# Patient Record
Sex: Male | Born: 1935 | Race: White | Hispanic: No | Marital: Married | Smoking: Former smoker
Health system: Southern US, Community
[De-identification: ages and names within clinical notes are randomized; demographics above are authoritative.]

## PROBLEM LIST (undated history)

## (undated) DIAGNOSIS — M1990 Unspecified osteoarthritis, unspecified site: Secondary | ICD-10-CM

## (undated) DIAGNOSIS — C20 Malignant neoplasm of rectum: Secondary | ICD-10-CM

## (undated) DIAGNOSIS — I4821 Permanent atrial fibrillation: Secondary | ICD-10-CM

## (undated) DIAGNOSIS — E7800 Pure hypercholesterolemia, unspecified: Secondary | ICD-10-CM

## (undated) DIAGNOSIS — Z86718 Personal history of other venous thrombosis and embolism: Secondary | ICD-10-CM

## (undated) DIAGNOSIS — E785 Hyperlipidemia, unspecified: Secondary | ICD-10-CM

## (undated) DIAGNOSIS — C4490 Unspecified malignant neoplasm of skin, unspecified: Secondary | ICD-10-CM

## (undated) DIAGNOSIS — N184 Chronic kidney disease, stage 4 (severe): Secondary | ICD-10-CM

## (undated) DIAGNOSIS — I493 Ventricular premature depolarization: Secondary | ICD-10-CM

## (undated) DIAGNOSIS — Z5189 Encounter for other specified aftercare: Secondary | ICD-10-CM

## (undated) DIAGNOSIS — G4700 Insomnia, unspecified: Secondary | ICD-10-CM

## (undated) DIAGNOSIS — I255 Ischemic cardiomyopathy: Secondary | ICD-10-CM

## (undated) DIAGNOSIS — K219 Gastro-esophageal reflux disease without esophagitis: Secondary | ICD-10-CM

## (undated) DIAGNOSIS — N400 Enlarged prostate without lower urinary tract symptoms: Secondary | ICD-10-CM

## (undated) DIAGNOSIS — Z950 Presence of cardiac pacemaker: Secondary | ICD-10-CM

## (undated) DIAGNOSIS — I2510 Atherosclerotic heart disease of native coronary artery without angina pectoris: Secondary | ICD-10-CM

## (undated) DIAGNOSIS — I10 Essential (primary) hypertension: Secondary | ICD-10-CM

## (undated) DIAGNOSIS — I509 Heart failure, unspecified: Secondary | ICD-10-CM

## (undated) DIAGNOSIS — K5730 Diverticulosis of large intestine without perforation or abscess without bleeding: Secondary | ICD-10-CM

## (undated) DIAGNOSIS — G7000 Myasthenia gravis without (acute) exacerbation: Secondary | ICD-10-CM

## (undated) HISTORY — PX: CATARACT EXTRACTION: SUR2

## (undated) HISTORY — DX: Diverticulosis of large intestine without perforation or abscess without bleeding: K57.30

## (undated) HISTORY — DX: Pure hypercholesterolemia, unspecified: E78.00

## (undated) HISTORY — DX: Ischemic cardiomyopathy: I25.5

## (undated) HISTORY — DX: Ventricular premature depolarization: I49.3

## (undated) HISTORY — DX: Heart failure, unspecified: I50.9

## (undated) HISTORY — PX: COLONOSCOPY: SHX174

## (undated) HISTORY — DX: Essential (primary) hypertension: I10

## (undated) HISTORY — DX: Malignant neoplasm of rectum: C20

## (undated) HISTORY — DX: Permanent atrial fibrillation: I48.21

---

## 1991-12-28 DIAGNOSIS — Z86718 Personal history of other venous thrombosis and embolism: Secondary | ICD-10-CM

## 1991-12-28 HISTORY — PX: CORONARY ARTERY BYPASS GRAFT: SHX141

## 1991-12-28 HISTORY — DX: Personal history of other venous thrombosis and embolism: Z86.718

## 1992-05-22 HISTORY — PX: CORONARY ANGIOPLASTY: SHX604

## 2004-07-12 ENCOUNTER — Emergency Department (HOSPITAL_COMMUNITY): Admission: EM | Admit: 2004-07-12 | Discharge: 2004-07-12 | Payer: Self-pay | Admitting: Emergency Medicine

## 2004-08-28 ENCOUNTER — Encounter: Admission: RE | Admit: 2004-08-28 | Discharge: 2004-08-28 | Payer: Self-pay | Admitting: General Surgery

## 2005-12-27 HISTORY — PX: SHOULDER SURGERY: SHX246

## 2006-07-30 ENCOUNTER — Emergency Department (HOSPITAL_COMMUNITY): Admission: EM | Admit: 2006-07-30 | Discharge: 2006-07-30 | Payer: Self-pay | Admitting: Emergency Medicine

## 2006-08-07 ENCOUNTER — Emergency Department (HOSPITAL_COMMUNITY): Admission: EM | Admit: 2006-08-07 | Discharge: 2006-08-08 | Payer: Self-pay | Admitting: Emergency Medicine

## 2006-08-15 ENCOUNTER — Inpatient Hospital Stay (HOSPITAL_COMMUNITY): Admission: RE | Admit: 2006-08-15 | Discharge: 2006-08-17 | Payer: Self-pay | Admitting: Orthopedic Surgery

## 2006-12-27 DIAGNOSIS — C20 Malignant neoplasm of rectum: Secondary | ICD-10-CM

## 2006-12-27 HISTORY — PX: COLOSTOMY: SHX63

## 2006-12-27 HISTORY — DX: Malignant neoplasm of rectum: C20

## 2006-12-27 HISTORY — PX: COLON SURGERY: SHX602

## 2007-01-24 ENCOUNTER — Ambulatory Visit: Payer: Self-pay | Admitting: Gastroenterology

## 2007-01-24 LAB — CONVERTED CEMR LAB
ALT: 15 units/L (ref 0–40)
Bilirubin, Direct: 0.1 mg/dL (ref 0.0–0.3)
Eosinophils Absolute: 0.2 10*3/uL (ref 0.0–0.6)
Eosinophils Relative: 1.9 % (ref 0.0–5.0)
HCT: 28.3 % — ABNORMAL LOW (ref 39.0–52.0)
Hemoglobin: 9.1 g/dL — ABNORMAL LOW (ref 13.0–17.0)
Lymphocytes Relative: 14 % (ref 12.0–46.0)
MCV: 73 fL — ABNORMAL LOW (ref 78.0–100.0)
Neutro Abs: 7.4 10*3/uL (ref 1.4–7.7)
Neutrophils Relative %: 77 % (ref 43.0–77.0)
Saturation Ratios: 3.1 % — ABNORMAL LOW (ref 20.0–50.0)
Total Protein: 6.7 g/dL (ref 6.0–8.3)
Transferrin: 347.7 mg/dL (ref >=212.0)
WBC: 9.7 10*3/uL (ref 4.5–10.5)

## 2007-01-26 ENCOUNTER — Ambulatory Visit: Payer: Self-pay | Admitting: Internal Medicine

## 2007-01-26 ENCOUNTER — Encounter (INDEPENDENT_AMBULATORY_CARE_PROVIDER_SITE_OTHER): Payer: Self-pay | Admitting: *Deleted

## 2007-01-30 ENCOUNTER — Ambulatory Visit: Payer: Self-pay | Admitting: Cardiology

## 2007-01-31 ENCOUNTER — Ambulatory Visit: Payer: Self-pay | Admitting: Hematology and Oncology

## 2007-02-03 LAB — CBC WITH DIFFERENTIAL/PLATELET
Basophils Absolute: 0 10*3/uL (ref 0.0–0.1)
EOS%: 2.3 % (ref 0.0–7.0)
Eosinophils Absolute: 0.2 10*3/uL (ref 0.0–0.5)
HCT: 28.7 % — ABNORMAL LOW (ref 38.7–49.9)
HGB: 9.4 g/dL — ABNORMAL LOW (ref 13.0–17.1)
MCH: 24.2 pg — ABNORMAL LOW (ref 28.0–33.4)
MCV: 74.3 fL — ABNORMAL LOW (ref 81.6–98.0)
MONO%: 8.4 % (ref 0.0–13.0)
NEUT#: 5.2 10*3/uL (ref 1.5–6.5)
NEUT%: 79 % — ABNORMAL HIGH (ref 40.0–75.0)
RDW: 17.4 % — ABNORMAL HIGH (ref 11.2–14.6)
lymph#: 0.7 10*3/uL — ABNORMAL LOW (ref 0.9–3.3)

## 2007-02-03 LAB — COMPREHENSIVE METABOLIC PANEL
Albumin: 3.9 g/dL (ref 3.5–5.2)
BUN: 13 mg/dL (ref 6–23)
Calcium: 9.8 mg/dL (ref 8.4–10.5)
Chloride: 104 mEq/L (ref 96–112)
Creatinine, Ser: 1.33 mg/dL (ref 0.40–1.50)
Glucose, Bld: 116 mg/dL — ABNORMAL HIGH (ref 70–99)
Potassium: 4.8 mEq/L (ref 3.5–5.3)

## 2007-02-06 ENCOUNTER — Ambulatory Visit (HOSPITAL_COMMUNITY): Admission: RE | Admit: 2007-02-06 | Discharge: 2007-02-06 | Payer: Self-pay | Admitting: Hematology and Oncology

## 2007-02-07 ENCOUNTER — Ambulatory Visit: Admission: RE | Admit: 2007-02-07 | Discharge: 2007-04-04 | Payer: Self-pay | Admitting: Radiation Oncology

## 2007-02-09 ENCOUNTER — Ambulatory Visit (HOSPITAL_COMMUNITY): Admission: RE | Admit: 2007-02-09 | Discharge: 2007-02-09 | Payer: Self-pay | Admitting: Gastroenterology

## 2007-02-14 ENCOUNTER — Ambulatory Visit (HOSPITAL_BASED_OUTPATIENT_CLINIC_OR_DEPARTMENT_OTHER): Admission: RE | Admit: 2007-02-14 | Discharge: 2007-02-14 | Payer: Self-pay | Admitting: Surgery

## 2007-02-14 ENCOUNTER — Ambulatory Visit: Payer: Self-pay | Admitting: Gastroenterology

## 2007-02-20 LAB — BASIC METABOLIC PANEL
Chloride: 106 mEq/L (ref 96–112)
Potassium: 4.2 mEq/L (ref 3.5–5.3)
Sodium: 141 mEq/L (ref 135–145)

## 2007-02-20 LAB — CBC WITH DIFFERENTIAL/PLATELET
Basophils Absolute: 0.1 10*3/uL (ref 0.0–0.1)
Eosinophils Absolute: 0.3 10*3/uL (ref 0.0–0.5)
HGB: 8.4 g/dL — ABNORMAL LOW (ref 13.0–17.1)
MONO#: 0.8 10*3/uL (ref 0.1–0.9)
NEUT#: 7.5 10*3/uL — ABNORMAL HIGH (ref 1.5–6.5)
RBC: 3.73 10*6/uL — ABNORMAL LOW (ref 4.20–5.71)
RDW: 15.9 % — ABNORMAL HIGH (ref 11.2–14.6)
WBC: 10.4 10*3/uL — ABNORMAL HIGH (ref 4.0–10.0)
lymph#: 1.7 10*3/uL (ref 0.9–3.3)

## 2007-02-21 ENCOUNTER — Encounter (HOSPITAL_COMMUNITY): Admission: RE | Admit: 2007-02-21 | Discharge: 2007-02-21 | Payer: Self-pay | Admitting: Hematology and Oncology

## 2007-02-21 LAB — CBC WITH DIFFERENTIAL/PLATELET
Basophils Absolute: 0 10*3/uL (ref 0.0–0.1)
Eosinophils Absolute: 0.2 10*3/uL (ref 0.0–0.5)
HCT: 26.2 % — ABNORMAL LOW (ref 38.7–49.9)
HGB: 8.5 g/dL — ABNORMAL LOW (ref 13.0–17.1)
LYMPH%: 13.9 % — ABNORMAL LOW (ref 14.0–48.0)
MCV: 72.6 fL — ABNORMAL LOW (ref 81.6–98.0)
MONO#: 0.6 10*3/uL (ref 0.1–0.9)
MONO%: 7.9 % (ref 0.0–13.0)
NEUT#: 5.3 10*3/uL (ref 1.5–6.5)
NEUT%: 74.2 % (ref 40.0–75.0)
Platelets: 251 10*3/uL (ref 145–400)
WBC: 7.1 10*3/uL (ref 4.0–10.0)

## 2007-02-22 LAB — TYPE & CROSSMATCH - CHCC

## 2007-02-27 ENCOUNTER — Emergency Department (HOSPITAL_COMMUNITY): Admission: EM | Admit: 2007-02-27 | Discharge: 2007-02-27 | Payer: Self-pay | Admitting: Emergency Medicine

## 2007-02-28 LAB — CBC WITH DIFFERENTIAL/PLATELET
Basophils Absolute: 0 10*3/uL (ref 0.0–0.1)
EOS%: 3.3 % (ref 0.0–7.0)
HCT: 32.8 % — ABNORMAL LOW (ref 38.7–49.9)
HGB: 10.7 g/dL — ABNORMAL LOW (ref 13.0–17.1)
LYMPH%: 10.3 % — ABNORMAL LOW (ref 14.0–48.0)
MCH: 25.1 pg — ABNORMAL LOW (ref 28.0–33.4)
NEUT%: 82.4 % — ABNORMAL HIGH (ref 40.0–75.0)
Platelets: 200 10*3/uL (ref 145–400)
lymph#: 0.8 10*3/uL — ABNORMAL LOW (ref 0.9–3.3)

## 2007-03-01 ENCOUNTER — Ambulatory Visit: Payer: Self-pay | Admitting: Gastroenterology

## 2007-03-06 LAB — CBC WITH DIFFERENTIAL/PLATELET
Basophils Absolute: 0 10*3/uL (ref 0.0–0.1)
EOS%: 3 % (ref 0.0–7.0)
HCT: 33.5 % — ABNORMAL LOW (ref 38.7–49.9)
HGB: 10.6 g/dL — ABNORMAL LOW (ref 13.0–17.1)
MCH: 25.1 pg — ABNORMAL LOW (ref 28.0–33.4)
MCHC: 31.6 g/dL — ABNORMAL LOW (ref 32.0–35.9)
MCV: 79.6 fL — ABNORMAL LOW (ref 81.6–98.0)
MONO%: 7.1 % (ref 0.0–13.0)
NEUT%: 80.4 % — ABNORMAL HIGH (ref 40.0–75.0)
RDW: 21.3 % — ABNORMAL HIGH (ref 11.2–14.6)

## 2007-03-08 ENCOUNTER — Ambulatory Visit: Payer: Self-pay | Admitting: Gastroenterology

## 2007-03-13 LAB — CBC WITH DIFFERENTIAL/PLATELET
BASO%: 0.2 % (ref 0.0–2.0)
EOS%: 2.5 % (ref 0.0–7.0)
MCH: 26 pg — ABNORMAL LOW (ref 28.0–33.4)
MCV: 78.6 fL — ABNORMAL LOW (ref 81.6–98.0)
MONO%: 8.8 % (ref 0.0–13.0)
RBC: 4.23 10*6/uL (ref 4.20–5.71)
RDW: 23.4 % — ABNORMAL HIGH (ref 11.2–14.6)

## 2007-03-16 ENCOUNTER — Ambulatory Visit: Payer: Self-pay | Admitting: Hematology and Oncology

## 2007-03-20 LAB — CBC WITH DIFFERENTIAL/PLATELET
Basophils Absolute: 0 10*3/uL (ref 0.0–0.1)
Eosinophils Absolute: 0.1 10*3/uL (ref 0.0–0.5)
HCT: 33.2 % — ABNORMAL LOW (ref 38.7–49.9)
HGB: 10.9 g/dL — ABNORMAL LOW (ref 13.0–17.1)
MCH: 26.2 pg — ABNORMAL LOW (ref 28.0–33.4)
MONO#: 0.6 10*3/uL (ref 0.1–0.9)
NEUT#: 6.1 10*3/uL (ref 1.5–6.5)
NEUT%: 85.6 % — ABNORMAL HIGH (ref 40.0–75.0)
lymph#: 0.3 10*3/uL — ABNORMAL LOW (ref 0.9–3.3)

## 2007-03-20 LAB — COMPREHENSIVE METABOLIC PANEL
Albumin: 3.8 g/dL (ref 3.5–5.2)
BUN: 16 mg/dL (ref 6–23)
CO2: 25 mEq/L (ref 19–32)
Calcium: 9.1 mg/dL (ref 8.4–10.5)
Chloride: 105 mEq/L (ref 96–112)
Creatinine, Ser: 1.42 mg/dL (ref 0.40–1.50)
Glucose, Bld: 105 mg/dL — ABNORMAL HIGH (ref 70–99)
Potassium: 5.3 mEq/L (ref 3.5–5.3)

## 2007-03-22 LAB — CBC WITH DIFFERENTIAL/PLATELET
BASO%: 0.3 % (ref 0.0–2.0)
Basophils Absolute: 0 10*3/uL (ref 0.0–0.1)
EOS%: 2.3 % (ref 0.0–7.0)
HCT: 31.7 % — ABNORMAL LOW (ref 38.7–49.9)
HGB: 10.6 g/dL — ABNORMAL LOW (ref 13.0–17.1)
MONO#: 0.6 10*3/uL (ref 0.1–0.9)
NEUT%: 81.4 % — ABNORMAL HIGH (ref 40.0–75.0)
RDW: 32.2 % — ABNORMAL HIGH (ref 11.2–14.6)
WBC: 5.6 10*3/uL (ref 4.0–10.0)
lymph#: 0.3 10*3/uL — ABNORMAL LOW (ref 0.9–3.3)

## 2007-03-22 LAB — COMPREHENSIVE METABOLIC PANEL
ALT: 10 U/L (ref 0–53)
AST: 17 U/L (ref 0–37)
Albumin: 3.8 g/dL (ref 3.5–5.2)
BUN: 14 mg/dL (ref 6–23)
CO2: 25 mEq/L (ref 19–32)
Calcium: 9.9 mg/dL (ref 8.4–10.5)
Chloride: 104 mEq/L (ref 96–112)
Creatinine, Ser: 1.32 mg/dL (ref 0.40–1.50)
Potassium: 5.2 mEq/L (ref 3.5–5.3)

## 2007-04-04 LAB — COMPREHENSIVE METABOLIC PANEL
Albumin: 3.8 g/dL (ref 3.5–5.2)
BUN: 15 mg/dL (ref 6–23)
CO2: 26 mEq/L (ref 19–32)
Calcium: 9.6 mg/dL (ref 8.4–10.5)
Chloride: 107 mEq/L (ref 96–112)
Creatinine, Ser: 1.18 mg/dL (ref 0.40–1.50)
Glucose, Bld: 110 mg/dL — ABNORMAL HIGH (ref 70–99)
Potassium: 4.9 mEq/L (ref 3.5–5.3)

## 2007-04-04 LAB — CBC WITH DIFFERENTIAL/PLATELET
BASO%: 0.1 % (ref 0.0–2.0)
EOS%: 2 % (ref 0.0–7.0)
HCT: 33 % — ABNORMAL LOW (ref 38.7–49.9)
MCH: 28.1 pg (ref 28.0–33.4)
MCHC: 33.6 g/dL (ref 32.0–35.9)
MONO#: 0.6 10*3/uL (ref 0.1–0.9)
RDW: 35.3 % — ABNORMAL HIGH (ref 11.2–14.6)
WBC: 6.6 10*3/uL (ref 4.0–10.0)
lymph#: 0.5 10*3/uL — ABNORMAL LOW (ref 0.9–3.3)

## 2007-04-20 ENCOUNTER — Ambulatory Visit (HOSPITAL_COMMUNITY): Admission: RE | Admit: 2007-04-20 | Discharge: 2007-04-20 | Payer: Self-pay | Admitting: Hematology and Oncology

## 2007-04-20 LAB — COMPREHENSIVE METABOLIC PANEL
ALT: 14 U/L (ref 0–53)
AST: 20 U/L (ref 0–37)
Albumin: 3.9 g/dL (ref 3.5–5.2)
CO2: 27 mEq/L (ref 19–32)
Calcium: 9.3 mg/dL (ref 8.4–10.5)
Chloride: 106 mEq/L (ref 96–112)
Creatinine, Ser: 1.01 mg/dL (ref 0.40–1.50)
Potassium: 4.6 mEq/L (ref 3.5–5.3)

## 2007-04-20 LAB — CBC WITH DIFFERENTIAL/PLATELET
BASO%: 0.6 % (ref 0.0–2.0)
Basophils Absolute: 0 10*3/uL (ref 0.0–0.1)
EOS%: 2.3 % (ref 0.0–7.0)
HCT: 32.8 % — ABNORMAL LOW (ref 38.7–49.9)
HGB: 11 g/dL — ABNORMAL LOW (ref 13.0–17.1)
MCHC: 33.5 g/dL (ref 32.0–35.9)
MONO#: 0.6 10*3/uL (ref 0.1–0.9)
NEUT%: 73.8 % (ref 40.0–75.0)
RDW: 31 % — ABNORMAL HIGH (ref 11.2–14.6)
WBC: 4.9 10*3/uL (ref 4.0–10.0)
lymph#: 0.6 10*3/uL — ABNORMAL LOW (ref 0.9–3.3)

## 2007-05-08 ENCOUNTER — Encounter: Admission: RE | Admit: 2007-05-08 | Discharge: 2007-05-08 | Payer: Self-pay | Admitting: Surgery

## 2007-05-09 ENCOUNTER — Ambulatory Visit: Payer: Self-pay | Admitting: Hematology and Oncology

## 2007-06-01 ENCOUNTER — Encounter (INDEPENDENT_AMBULATORY_CARE_PROVIDER_SITE_OTHER): Payer: Self-pay | Admitting: Surgery

## 2007-06-01 ENCOUNTER — Inpatient Hospital Stay (HOSPITAL_COMMUNITY): Admission: RE | Admit: 2007-06-01 | Discharge: 2007-06-10 | Payer: Self-pay | Admitting: Surgery

## 2007-06-01 HISTORY — PX: ABDOMINOPERINEAL PROCTOCOLECTOMY: SUR8

## 2007-06-20 ENCOUNTER — Ambulatory Visit: Payer: Self-pay | Admitting: Hematology and Oncology

## 2007-06-29 LAB — COMPREHENSIVE METABOLIC PANEL
ALT: 12 U/L (ref 0–53)
AST: 16 U/L (ref 0–37)
Albumin: 3.8 g/dL (ref 3.5–5.2)
CO2: 27 mEq/L (ref 19–32)
Calcium: 9.8 mg/dL (ref 8.4–10.5)
Chloride: 102 mEq/L (ref 96–112)
Creatinine, Ser: 1.23 mg/dL (ref 0.40–1.50)
Potassium: 5.5 mEq/L — ABNORMAL HIGH (ref 3.5–5.3)
Sodium: 139 mEq/L (ref 135–145)
Total Protein: 6.2 g/dL (ref 6.0–8.3)

## 2007-06-29 LAB — CBC WITH DIFFERENTIAL/PLATELET
BASO%: 0.2 % (ref 0.0–2.0)
EOS%: 4.6 % (ref 0.0–7.0)
HCT: 33.3 % — ABNORMAL LOW (ref 38.7–49.9)
MCH: 30.7 pg (ref 28.0–33.4)
MCHC: 34 g/dL (ref 32.0–35.9)
MONO#: 0.7 10*3/uL (ref 0.1–0.9)
RBC: 3.68 10*6/uL — ABNORMAL LOW (ref 4.20–5.71)
RDW: 13.7 % (ref 11.2–14.6)
WBC: 8.3 10*3/uL (ref 4.0–10.0)
lymph#: 0.9 10*3/uL (ref 0.9–3.3)

## 2007-06-29 LAB — CEA: CEA: <0.5 ng/mL (ref 0.0–5.0)

## 2007-08-08 ENCOUNTER — Ambulatory Visit: Payer: Self-pay | Admitting: Hematology and Oncology

## 2007-08-10 LAB — CBC WITH DIFFERENTIAL/PLATELET
Basophils Absolute: 0 10*3/uL (ref 0.0–0.1)
Eosinophils Absolute: 0.2 10*3/uL (ref 0.0–0.5)
HCT: 34.9 % — ABNORMAL LOW (ref 38.7–49.9)
HGB: 12 g/dL — ABNORMAL LOW (ref 13.0–17.1)
LYMPH%: 8.9 % — ABNORMAL LOW (ref 14.0–48.0)
MCV: 89.3 fL (ref 81.6–98.0)
MONO#: 0.4 10*3/uL (ref 0.1–0.9)
MONO%: 6.8 % (ref 0.0–13.0)
NEUT#: 4.9 10*3/uL (ref 1.5–6.5)
NEUT%: 81.2 % — ABNORMAL HIGH (ref 40.0–75.0)
Platelets: 170 10*3/uL (ref 145–400)
RBC: 3.9 10*6/uL — ABNORMAL LOW (ref 4.20–5.71)
WBC: 6.1 10*3/uL (ref 4.0–10.0)

## 2007-08-10 LAB — COMPREHENSIVE METABOLIC PANEL
Alkaline Phosphatase: 70 U/L (ref 39–117)
BUN: 9 mg/dL (ref 6–23)
CO2: 25 mEq/L (ref 19–32)
Glucose, Bld: 95 mg/dL (ref 70–99)
Sodium: 143 mEq/L (ref 135–145)
Total Bilirubin: 0.7 mg/dL (ref 0.3–1.2)
Total Protein: 6.3 g/dL (ref 6.0–8.3)

## 2007-09-11 LAB — CBC WITH DIFFERENTIAL/PLATELET
Basophils Absolute: 0 10*3/uL (ref 0.0–0.1)
Eosinophils Absolute: 0.2 10*3/uL (ref 0.0–0.5)
HGB: 12.5 g/dL — ABNORMAL LOW (ref 13.0–17.1)
LYMPH%: 11.1 % — ABNORMAL LOW (ref 14.0–48.0)
MONO#: 0.4 10*3/uL (ref 0.1–0.9)
NEUT#: 5 10*3/uL (ref 1.5–6.5)
Platelets: 201 10*3/uL (ref 145–400)
RBC: 4.02 10*6/uL — ABNORMAL LOW (ref 4.20–5.71)
RDW: 13.9 % (ref 11.2–14.6)
WBC: 6.4 10*3/uL (ref 4.0–10.0)

## 2007-09-11 LAB — BASIC METABOLIC PANEL
BUN: 11 mg/dL (ref 6–23)
CO2: 24 mEq/L (ref 19–32)
Calcium: 9.6 mg/dL (ref 8.4–10.5)
Chloride: 102 mEq/L (ref 96–112)
Creatinine, Ser: 1.11 mg/dL (ref 0.40–1.50)
Glucose, Bld: 110 mg/dL — ABNORMAL HIGH (ref 70–99)
Potassium: 4.3 mEq/L (ref 3.5–5.3)
Sodium: 142 mEq/L (ref 135–145)

## 2007-10-02 ENCOUNTER — Ambulatory Visit: Payer: Self-pay | Admitting: Hematology and Oncology

## 2007-10-04 LAB — CBC WITH DIFFERENTIAL/PLATELET
BASO%: 0.3 % (ref 0.0–2.0)
EOS%: 3.7 % (ref 0.0–7.0)
HCT: 34.9 % — ABNORMAL LOW (ref 38.7–49.9)
LYMPH%: 10.3 % — ABNORMAL LOW (ref 14.0–48.0)
MCH: 32.1 pg (ref 28.0–33.4)
MCHC: 34.8 g/dL (ref 32.0–35.9)
MCV: 92.3 fL (ref 81.6–98.0)
MONO%: 9.7 % (ref 0.0–13.0)
NEUT%: 76 % — ABNORMAL HIGH (ref 40.0–75.0)
Platelets: 171 10*3/uL (ref 145–400)

## 2007-10-04 LAB — COMPREHENSIVE METABOLIC PANEL
AST: 20 U/L (ref 0–37)
Albumin: 4.2 g/dL (ref 3.5–5.2)
BUN: 14 mg/dL (ref 6–23)
Calcium: 9.3 mg/dL (ref 8.4–10.5)
Chloride: 104 mEq/L (ref 96–112)
Glucose, Bld: 103 mg/dL — ABNORMAL HIGH (ref 70–99)
Potassium: 4.4 mEq/L (ref 3.5–5.3)
Sodium: 143 mEq/L (ref 135–145)
Total Protein: 6.6 g/dL (ref 6.0–8.3)

## 2007-10-24 LAB — CBC WITH DIFFERENTIAL/PLATELET
BASO%: 0.7 % (ref 0.0–2.0)
Basophils Absolute: 0 10*3/uL (ref 0.0–0.1)
EOS%: 3.7 % (ref 0.0–7.0)
HGB: 12.9 g/dL — ABNORMAL LOW (ref 13.0–17.1)
MCH: 32.6 pg (ref 28.0–33.4)
MONO%: 8.5 % (ref 0.0–13.0)
RBC: 3.94 10*6/uL — ABNORMAL LOW (ref 4.20–5.71)
RDW: 19.4 % — ABNORMAL HIGH (ref 11.2–14.6)
lymph#: 0.8 10*3/uL — ABNORMAL LOW (ref 0.9–3.3)

## 2007-10-24 LAB — COMPREHENSIVE METABOLIC PANEL
ALT: 12 U/L (ref 0–53)
AST: 18 U/L (ref 0–37)
Albumin: 4.1 g/dL (ref 3.5–5.2)
Alkaline Phosphatase: 73 U/L (ref 39–117)
BUN: 14 mg/dL (ref 6–23)
Calcium: 9.7 mg/dL (ref 8.4–10.5)
Chloride: 104 mEq/L (ref 96–112)
Potassium: 4.3 mEq/L (ref 3.5–5.3)
Sodium: 143 mEq/L (ref 135–145)
Total Protein: 6.4 g/dL (ref 6.0–8.3)

## 2007-11-14 LAB — COMPREHENSIVE METABOLIC PANEL
ALT: 13 U/L (ref 0–53)
AST: 21 U/L (ref 0–37)
Albumin: 4 g/dL (ref 3.5–5.2)
Alkaline Phosphatase: 70 U/L (ref 39–117)
BUN: 18 mg/dL (ref 6–23)
Calcium: 9 mg/dL (ref 8.4–10.5)
Chloride: 104 mEq/L (ref 96–112)
Potassium: 4.2 mEq/L (ref 3.5–5.3)

## 2007-11-14 LAB — CBC WITH DIFFERENTIAL/PLATELET
BASO%: 0.2 % (ref 0.0–2.0)
Basophils Absolute: 0 10*3/uL (ref 0.0–0.1)
EOS%: 3.4 % (ref 0.0–7.0)
MCH: 33 pg (ref 28.0–33.4)
MCHC: 34.3 g/dL (ref 32.0–35.9)
MCV: 96.1 fL (ref 81.6–98.0)
MONO%: 8.6 % (ref 0.0–13.0)
RBC: 3.79 10*6/uL — ABNORMAL LOW (ref 4.20–5.71)
RDW: 20.6 % — ABNORMAL HIGH (ref 11.2–14.6)
lymph#: 0.6 10*3/uL — ABNORMAL LOW (ref 0.9–3.3)

## 2007-12-04 ENCOUNTER — Ambulatory Visit: Payer: Self-pay | Admitting: Hematology and Oncology

## 2007-12-27 LAB — CBC WITH DIFFERENTIAL/PLATELET
BASO%: 0.5 % (ref 0.0–2.0)
Basophils Absolute: 0 10*3/uL (ref 0.0–0.1)
EOS%: 3.6 % (ref 0.0–7.0)
HCT: 37.1 % — ABNORMAL LOW (ref 38.7–49.9)
HGB: 12.8 g/dL — ABNORMAL LOW (ref 13.0–17.1)
MCH: 34.5 pg — ABNORMAL HIGH (ref 28.0–33.4)
MCHC: 34.5 g/dL (ref 32.0–35.9)
MCV: 100 fL — ABNORMAL HIGH (ref 81.6–98.0)
MONO%: 9.5 % (ref 0.0–13.0)
NEUT%: 71.2 % (ref 40.0–75.0)

## 2007-12-27 LAB — COMPREHENSIVE METABOLIC PANEL
AST: 19 U/L (ref 0–37)
Alkaline Phosphatase: 82 U/L (ref 39–117)
BUN: 13 mg/dL (ref 6–23)
Creatinine, Ser: 1.11 mg/dL (ref 0.40–1.50)
Glucose, Bld: 97 mg/dL (ref 70–99)

## 2008-01-16 ENCOUNTER — Ambulatory Visit (HOSPITAL_COMMUNITY): Admission: RE | Admit: 2008-01-16 | Discharge: 2008-01-16 | Payer: Self-pay | Admitting: Hematology and Oncology

## 2008-01-16 LAB — CBC WITH DIFFERENTIAL/PLATELET
Basophils Absolute: 0 10*3/uL (ref 0.0–0.1)
Eosinophils Absolute: 0.2 10*3/uL (ref 0.0–0.5)
HGB: 13.6 g/dL (ref 13.0–17.1)
LYMPH%: 12.7 % — ABNORMAL LOW (ref 14.0–48.0)
MCV: 99.5 fL — ABNORMAL HIGH (ref 81.6–98.0)
MONO#: 0.6 10*3/uL (ref 0.1–0.9)
MONO%: 8.7 % (ref 0.0–13.0)
NEUT#: 5 10*3/uL (ref 1.5–6.5)
Platelets: 160 10*3/uL (ref 145–400)
RDW: 17.8 % — ABNORMAL HIGH (ref 11.2–14.6)
WBC: 6.6 10*3/uL (ref 4.0–10.0)

## 2008-01-16 LAB — COMPREHENSIVE METABOLIC PANEL
Albumin: 4.3 g/dL (ref 3.5–5.2)
Alkaline Phosphatase: 83 U/L (ref 39–117)
BUN: 15 mg/dL (ref 6–23)
CO2: 28 mEq/L (ref 19–32)
Glucose, Bld: 101 mg/dL — ABNORMAL HIGH (ref 70–99)
Potassium: 4.6 mEq/L (ref 3.5–5.3)
Sodium: 144 mEq/L (ref 135–145)
Total Protein: 6.8 g/dL (ref 6.0–8.3)

## 2008-01-16 LAB — CEA: CEA: 0.5 ng/mL (ref 0.0–5.0)

## 2008-01-19 ENCOUNTER — Ambulatory Visit: Payer: Self-pay | Admitting: Hematology and Oncology

## 2008-01-30 ENCOUNTER — Ambulatory Visit: Payer: Self-pay | Admitting: Gastroenterology

## 2008-02-12 ENCOUNTER — Ambulatory Visit: Payer: Self-pay | Admitting: Gastroenterology

## 2008-03-18 ENCOUNTER — Ambulatory Visit: Payer: Self-pay | Admitting: Hematology and Oncology

## 2008-04-19 LAB — CBC WITH DIFFERENTIAL/PLATELET
Basophils Absolute: 0 10*3/uL (ref 0.0–0.1)
EOS%: 2.3 % (ref 0.0–7.0)
Eosinophils Absolute: 0.2 10*3/uL (ref 0.0–0.5)
HCT: 39.8 % (ref 38.7–49.9)
MCH: 32.2 pg (ref 28.0–33.4)
MCHC: 34.5 g/dL (ref 32.0–35.9)
MONO#: 0.6 10*3/uL (ref 0.1–0.9)
NEUT%: 76.6 % — ABNORMAL HIGH (ref 40.0–75.0)
lymph#: 0.8 10*3/uL — ABNORMAL LOW (ref 0.9–3.3)

## 2008-04-19 LAB — COMPREHENSIVE METABOLIC PANEL
AST: 25 U/L (ref 0–37)
BUN: 15 mg/dL (ref 6–23)
Calcium: 9.7 mg/dL (ref 8.4–10.5)
Chloride: 102 mEq/L (ref 96–112)
Creatinine, Ser: 1.08 mg/dL (ref 0.40–1.50)

## 2008-04-19 LAB — LACTATE DEHYDROGENASE: LDH: 127 U/L (ref 94–250)

## 2008-04-22 ENCOUNTER — Ambulatory Visit: Admission: RE | Admit: 2008-04-22 | Discharge: 2008-04-22 | Payer: Self-pay | Admitting: Hematology and Oncology

## 2008-04-29 ENCOUNTER — Ambulatory Visit: Payer: Self-pay | Admitting: Hematology and Oncology

## 2008-08-11 ENCOUNTER — Ambulatory Visit: Payer: Self-pay | Admitting: Hematology and Oncology

## 2008-08-16 ENCOUNTER — Ambulatory Visit (HOSPITAL_COMMUNITY): Admission: RE | Admit: 2008-08-16 | Discharge: 2008-08-16 | Payer: Self-pay | Admitting: Hematology and Oncology

## 2008-08-16 LAB — COMPREHENSIVE METABOLIC PANEL
ALT: 18 U/L (ref 0–53)
AST: 39 U/L — ABNORMAL HIGH (ref 0–37)
Alkaline Phosphatase: 75 U/L (ref 39–117)
Creatinine, Ser: 0.95 mg/dL (ref 0.40–1.50)
Total Bilirubin: 1 mg/dL (ref 0.3–1.2)

## 2008-08-16 LAB — CBC WITH DIFFERENTIAL/PLATELET
BASO%: 0.2 % (ref 0.0–2.0)
EOS%: 3.3 % (ref 0.0–7.0)
HCT: 41.9 % (ref 38.7–49.9)
LYMPH%: 12 % — ABNORMAL LOW (ref 14.0–48.0)
MCH: 31.2 pg (ref 28.0–33.4)
MCHC: 33.6 g/dL (ref 32.0–35.9)
MONO%: 7.2 % (ref 0.0–13.0)
NEUT%: 77.3 % — ABNORMAL HIGH (ref 40.0–75.0)
Platelets: 142 10*3/uL — ABNORMAL LOW (ref 145–400)

## 2008-08-17 LAB — CEA: CEA: 1 ng/mL (ref 0.0–5.0)

## 2008-10-15 ENCOUNTER — Ambulatory Visit: Payer: Self-pay | Admitting: Hematology and Oncology

## 2008-12-05 ENCOUNTER — Ambulatory Visit: Payer: Self-pay | Admitting: Hematology and Oncology

## 2008-12-09 LAB — CBC WITH DIFFERENTIAL/PLATELET
Basophils Absolute: 0 10*3/uL (ref 0.0–0.1)
EOS%: 2.7 % (ref 0.0–7.0)
Eosinophils Absolute: 0.2 10*3/uL (ref 0.0–0.5)
HCT: 40.8 % (ref 38.7–49.9)
HGB: 13.8 g/dL (ref 13.0–17.1)
MCH: 31.4 pg (ref 28.0–33.4)
NEUT#: 5.1 10*3/uL (ref 1.5–6.5)
NEUT%: 77.3 % — ABNORMAL HIGH (ref 40.0–75.0)
lymph#: 0.7 10*3/uL — ABNORMAL LOW (ref 0.9–3.3)

## 2008-12-10 LAB — CEA: CEA: 1 ng/mL (ref 0.0–5.0)

## 2008-12-10 LAB — COMPREHENSIVE METABOLIC PANEL
Albumin: 4.1 g/dL (ref 3.5–5.2)
BUN: 13 mg/dL (ref 6–23)
CO2: 27 mEq/L (ref 19–32)
Calcium: 9.2 mg/dL (ref 8.4–10.5)
Chloride: 102 mEq/L (ref 96–112)
Creatinine, Ser: 0.91 mg/dL (ref 0.40–1.50)
Glucose, Bld: 91 mg/dL (ref 70–99)
Potassium: 3.8 mEq/L (ref 3.5–5.3)

## 2008-12-12 ENCOUNTER — Encounter: Payer: Self-pay | Admitting: Gastroenterology

## 2009-01-21 ENCOUNTER — Encounter: Payer: Self-pay | Admitting: Gastroenterology

## 2009-02-04 ENCOUNTER — Ambulatory Visit: Payer: Self-pay | Admitting: Hematology and Oncology

## 2009-02-21 ENCOUNTER — Encounter: Payer: Self-pay | Admitting: Gastroenterology

## 2009-04-01 ENCOUNTER — Ambulatory Visit: Payer: Self-pay | Admitting: Hematology and Oncology

## 2009-04-07 LAB — CEA: CEA: 0.9 ng/mL (ref 0.0–5.0)

## 2009-04-07 LAB — COMPREHENSIVE METABOLIC PANEL WITH GFR
ALT: 16 U/L (ref 0–53)
AST: 18 U/L (ref 0–37)
Albumin: 3.8 g/dL (ref 3.5–5.2)
Alkaline Phosphatase: 90 U/L (ref 39–117)
BUN: 13 mg/dL (ref 6–23)
CO2: 28 meq/L (ref 19–32)
Calcium: 9.1 mg/dL (ref 8.4–10.5)
Chloride: 105 meq/L (ref 96–112)
Creatinine, Ser: 1.02 mg/dL (ref 0.40–1.50)
Glucose, Bld: 125 mg/dL — ABNORMAL HIGH (ref 70–99)
Potassium: 3.8 meq/L (ref 3.5–5.3)
Sodium: 142 meq/L (ref 135–145)
Total Bilirubin: 0.6 mg/dL (ref 0.3–1.2)
Total Protein: 6 g/dL (ref 6.0–8.3)

## 2009-04-07 LAB — CBC WITH DIFFERENTIAL/PLATELET
BASO%: 0 % (ref 0.0–2.0)
EOS%: 2.5 % (ref 0.0–7.0)
HCT: 37.9 % — ABNORMAL LOW (ref 38.4–49.9)
MCH: 31.5 pg (ref 27.2–33.4)
MCHC: 33.6 g/dL (ref 32.0–36.0)
NEUT%: 78.2 % — ABNORMAL HIGH (ref 39.0–75.0)
RBC: 4.05 10*6/uL — ABNORMAL LOW (ref 4.20–5.82)
RDW: 14 % (ref 11.0–14.6)
lymph#: 0.7 10*3/uL — ABNORMAL LOW (ref 0.9–3.3)

## 2009-04-08 ENCOUNTER — Encounter: Payer: Self-pay | Admitting: Gastroenterology

## 2009-04-14 ENCOUNTER — Ambulatory Visit (HOSPITAL_COMMUNITY): Admission: RE | Admit: 2009-04-14 | Discharge: 2009-04-14 | Payer: Self-pay | Admitting: Hematology and Oncology

## 2009-04-17 ENCOUNTER — Encounter: Payer: Self-pay | Admitting: Gastroenterology

## 2009-05-08 ENCOUNTER — Ambulatory Visit: Payer: Self-pay | Admitting: Gastroenterology

## 2009-05-08 DIAGNOSIS — K219 Gastro-esophageal reflux disease without esophagitis: Secondary | ICD-10-CM | POA: Insufficient documentation

## 2009-05-08 DIAGNOSIS — I10 Essential (primary) hypertension: Secondary | ICD-10-CM

## 2009-05-08 DIAGNOSIS — M159 Polyosteoarthritis, unspecified: Secondary | ICD-10-CM | POA: Insufficient documentation

## 2009-05-08 DIAGNOSIS — D509 Iron deficiency anemia, unspecified: Secondary | ICD-10-CM | POA: Insufficient documentation

## 2009-05-08 DIAGNOSIS — E7800 Pure hypercholesterolemia, unspecified: Secondary | ICD-10-CM | POA: Insufficient documentation

## 2009-05-08 DIAGNOSIS — Z860100 Personal history of colon polyps, unspecified: Secondary | ICD-10-CM | POA: Insufficient documentation

## 2009-05-08 DIAGNOSIS — I219 Acute myocardial infarction, unspecified: Secondary | ICD-10-CM | POA: Insufficient documentation

## 2009-05-08 DIAGNOSIS — C20 Malignant neoplasm of rectum: Secondary | ICD-10-CM

## 2009-05-09 LAB — CONVERTED CEMR LAB
Basophils Relative: 0 % (ref 0.0–3.0)
Eosinophils Absolute: 0.1 10*3/uL (ref 0.0–0.7)
Ferritin: 365.2 ng/mL — ABNORMAL HIGH (ref 22.0–322.0)
HCT: 39.4 % (ref 39.0–52.0)
Lymphs Abs: 0.8 10*3/uL (ref 0.7–4.0)
MCHC: 33.6 g/dL (ref 30.0–36.0)
MCV: 95.7 fL (ref 78.0–100.0)
Monocytes Absolute: 0.6 10*3/uL (ref 0.1–1.0)
Neutrophils Relative %: 89 % — ABNORMAL HIGH (ref 43.0–77.0)
RBC: 4.11 M/uL — ABNORMAL LOW (ref 4.22–5.81)
Saturation Ratios: 41.5 % (ref 20.0–50.0)

## 2009-05-31 ENCOUNTER — Encounter: Admission: RE | Admit: 2009-05-31 | Discharge: 2009-05-31 | Payer: Self-pay | Admitting: Orthopedic Surgery

## 2009-06-18 ENCOUNTER — Ambulatory Visit (HOSPITAL_BASED_OUTPATIENT_CLINIC_OR_DEPARTMENT_OTHER): Admission: RE | Admit: 2009-06-18 | Discharge: 2009-06-18 | Payer: Self-pay | Admitting: Surgery

## 2009-10-10 ENCOUNTER — Ambulatory Visit: Payer: Self-pay | Admitting: Hematology and Oncology

## 2009-10-14 LAB — COMPREHENSIVE METABOLIC PANEL
ALT: 15 U/L (ref 0–53)
Albumin: 3.9 g/dL (ref 3.5–5.2)
CO2: 28 mEq/L (ref 19–32)
Calcium: 9 mg/dL (ref 8.4–10.5)
Chloride: 103 mEq/L (ref 96–112)
Glucose, Bld: 108 mg/dL — ABNORMAL HIGH (ref 70–99)
Potassium: 3.4 mEq/L — ABNORMAL LOW (ref 3.5–5.3)
Sodium: 141 mEq/L (ref 135–145)
Total Protein: 6.2 g/dL (ref 6.0–8.3)

## 2009-10-14 LAB — CBC WITH DIFFERENTIAL/PLATELET
BASO%: 0.3 % (ref 0.0–2.0)
Eosinophils Absolute: 0.1 10*3/uL (ref 0.0–0.5)
HCT: 36.2 % — ABNORMAL LOW (ref 38.4–49.9)
MCHC: 33.6 g/dL (ref 32.0–36.0)
MONO#: 0.4 10*3/uL (ref 0.1–0.9)
NEUT#: 4.6 10*3/uL (ref 1.5–6.5)
RBC: 3.9 10*6/uL — ABNORMAL LOW (ref 4.20–5.82)
WBC: 5.8 10*3/uL (ref 4.0–10.3)
lymph#: 0.6 10*3/uL — ABNORMAL LOW (ref 0.9–3.3)

## 2009-10-14 LAB — CEA: CEA: 0.6 ng/mL (ref 0.0–5.0)

## 2009-10-16 ENCOUNTER — Encounter: Payer: Self-pay | Admitting: Gastroenterology

## 2009-10-21 ENCOUNTER — Encounter: Payer: Self-pay | Admitting: Gastroenterology

## 2010-01-02 ENCOUNTER — Encounter (INDEPENDENT_AMBULATORY_CARE_PROVIDER_SITE_OTHER): Payer: Self-pay | Admitting: *Deleted

## 2010-01-12 ENCOUNTER — Encounter (INDEPENDENT_AMBULATORY_CARE_PROVIDER_SITE_OTHER): Payer: Self-pay | Admitting: *Deleted

## 2010-01-21 ENCOUNTER — Encounter (INDEPENDENT_AMBULATORY_CARE_PROVIDER_SITE_OTHER): Payer: Self-pay | Admitting: *Deleted

## 2010-01-26 ENCOUNTER — Ambulatory Visit: Payer: Self-pay | Admitting: Gastroenterology

## 2010-02-09 ENCOUNTER — Ambulatory Visit: Payer: Self-pay | Admitting: Gastroenterology

## 2010-02-24 ENCOUNTER — Encounter: Payer: Self-pay | Admitting: Gastroenterology

## 2010-04-08 ENCOUNTER — Ambulatory Visit: Payer: Self-pay | Admitting: Hematology and Oncology

## 2010-04-10 LAB — CBC WITH DIFFERENTIAL/PLATELET
Basophils Absolute: 0 10*3/uL (ref 0.0–0.1)
EOS%: 2.2 % (ref 0.0–7.0)
Eosinophils Absolute: 0.2 10*3/uL (ref 0.0–0.5)
HGB: 12.6 g/dL — ABNORMAL LOW (ref 13.0–17.1)
MCV: 92.6 fL (ref 79.3–98.0)
MONO%: 6.8 % (ref 0.0–14.0)
NEUT#: 5.5 10*3/uL (ref 1.5–6.5)
RBC: 4.03 10*6/uL — ABNORMAL LOW (ref 4.20–5.82)
RDW: 13.6 % (ref 11.0–14.6)
lymph#: 0.7 10*3/uL — ABNORMAL LOW (ref 0.9–3.3)

## 2010-04-10 LAB — COMPREHENSIVE METABOLIC PANEL
AST: 28 U/L (ref 0–37)
Albumin: 3.7 g/dL (ref 3.5–5.2)
Alkaline Phosphatase: 77 U/L (ref 39–117)
BUN: 12 mg/dL (ref 6–23)
Calcium: 9.2 mg/dL (ref 8.4–10.5)
Chloride: 104 mEq/L (ref 96–112)
Potassium: 3.5 mEq/L (ref 3.5–5.3)
Sodium: 141 mEq/L (ref 135–145)
Total Protein: 6.2 g/dL (ref 6.0–8.3)

## 2010-04-13 ENCOUNTER — Ambulatory Visit (HOSPITAL_COMMUNITY): Admission: RE | Admit: 2010-04-13 | Discharge: 2010-04-13 | Payer: Self-pay | Admitting: Oncology

## 2010-04-22 ENCOUNTER — Encounter: Payer: Self-pay | Admitting: Gastroenterology

## 2010-05-01 ENCOUNTER — Telehealth (INDEPENDENT_AMBULATORY_CARE_PROVIDER_SITE_OTHER): Payer: Self-pay | Admitting: *Deleted

## 2010-05-21 ENCOUNTER — Encounter: Payer: Self-pay | Admitting: Gastroenterology

## 2010-07-30 ENCOUNTER — Ambulatory Visit: Payer: Self-pay | Admitting: Cardiology

## 2010-09-03 ENCOUNTER — Encounter: Payer: Self-pay | Admitting: Gastroenterology

## 2010-10-15 ENCOUNTER — Ambulatory Visit: Payer: Self-pay | Admitting: Hematology and Oncology

## 2010-10-19 LAB — CBC WITH DIFFERENTIAL/PLATELET
Basophils Absolute: 0 10*3/uL (ref 0.0–0.1)
Eosinophils Absolute: 0.2 10*3/uL (ref 0.0–0.5)
HCT: 39.2 % (ref 38.4–49.9)
HGB: 12.9 g/dL — ABNORMAL LOW (ref 13.0–17.1)
MCH: 30.5 pg (ref 27.2–33.4)
MONO#: 0.5 10*3/uL (ref 0.1–0.9)
NEUT#: 5.3 10*3/uL (ref 1.5–6.5)
NEUT%: 78.7 % — ABNORMAL HIGH (ref 39.0–75.0)
RDW: 13.6 % (ref 11.0–14.6)
WBC: 6.7 10*3/uL (ref 4.0–10.3)
lymph#: 0.8 10*3/uL — ABNORMAL LOW (ref 0.9–3.3)

## 2010-10-20 LAB — CEA: CEA: 1.2 ng/mL (ref 0.0–5.0)

## 2010-10-20 LAB — COMPREHENSIVE METABOLIC PANEL
Albumin: 4 g/dL (ref 3.5–5.2)
BUN: 11 mg/dL (ref 6–23)
CO2: 32 mEq/L (ref 19–32)
Calcium: 9.6 mg/dL (ref 8.4–10.5)
Chloride: 105 mEq/L (ref 96–112)
Creatinine, Ser: 0.98 mg/dL (ref 0.40–1.50)
Glucose, Bld: 110 mg/dL — ABNORMAL HIGH (ref 70–99)
Potassium: 4.1 mEq/L (ref 3.5–5.3)

## 2010-10-22 ENCOUNTER — Encounter: Payer: Self-pay | Admitting: Gastroenterology

## 2010-12-03 ENCOUNTER — Ambulatory Visit: Payer: Self-pay | Admitting: Cardiology

## 2011-01-17 ENCOUNTER — Encounter: Payer: Self-pay | Admitting: Oncology

## 2011-01-28 NOTE — Letter (Signed)
Summary: Colonoscopy Letter  [facility]  [address] [place], [place] [address]   Phone: [phone]  Fax: [phone]      January 02, 2010 MRN: [id number]   DONALD MEMOLI [address] [place], [place]  [address]   Dear Mr. Dombeck,   According to your medical record, it is time for you to schedule a Colonoscopy. The American Cancer Society recommends this procedure as a method to detect early colon cancer. Patients with a family history of colon cancer, or a personal history of colon polyps or inflammatory bowel disease are at increased risk.  This letter has beeen generated based on the recommendations made at the time of your procedure. If you feel that in your particular situation this may no longer apply, please contact our office.  Please call our office at [phone] to schedule this appointment or to update your records at your earliest convenience.  Thank you for cooperating with [place] to provide you with the very best care possible.   Sincerely,  [name], M.D.  [facility] [phone]

## 2011-01-28 NOTE — Letter (Signed)
Summary: [facility]  [facility]   Imported By: Sherian Rein 11/05/2010 08:48:59  _____________________________________________________________________  External Attachment:    Type:   Image     Comment:   External Document

## 2011-01-28 NOTE — Procedures (Signed)
Summary: Colonoscopy  Patient: Nicholas [name] Note: All result statuses are Final unless otherwise noted.  Tests: (1) Colonoscopy (COL)   COL Colonoscopy           DONE     [facility]     [address]     [place], [place]  [address]           COLONOSCOPY PROCEDURE REPORT           PATIENT:  Nicholas, [name]  MR#:  [id number]     BIRTHDATE:  1934-08-17, 75 yrs. old  GENDER:  male           ENDOSCOPIST:  [name], MD, [facility]     Referred by:           PROCEDURE DATE:  02/09/2010     PROCEDURE:  Surveillance Colonoscopy     ASA CLASS:  Class III     INDICATIONS:  history of pre-cancerous (adenomatous) colon polyps,     history of colon cancer           MEDICATIONS:   Fentanyl 25 mcg IV, Versed 5 mg IV           DESCRIPTION OF PROCEDURE:   After the risks benefits and     alternatives of the procedure were thoroughly explained, informed     consent was obtained.  COLOSTOMY EXAM NORMAL. The LB PCF-Q180AL     [id number] endoscope was introduced through the anus and advanced to     the cecum, which was identified by both the appendix and ileocecal     valve, without limitations.  The quality of the prep was adequate,     using MoviPrep.  The instrument was then slowly withdrawn as the     colon was fully examined.     <<PROCEDUREIMAGES>>           FINDINGS:  Moderate diverticulosis was found transverse to sigmoid     No polyps or cancers were seen.  This was otherwise a normal     examination of the colon. LIPOMA ON IC VALVE NOTED.   Retroflexed     views in the rectum revealed not done.  NO RECTUM.  The scope was     then withdrawn from the patient and the procedure completed.           COMPLICATIONS:  None           ENDOSCOPIC IMPRESSION:     1) Moderate diverticulosis in the transverse to sigmoid     2) No polyps or cancers     3) Otherwise normal examination     RECOMMENDATIONS:     PRN FOLLOWUP           REPEAT EXAM:  No        ______________________________     [name], MD, [facility]           CC:  Windle Guard, MDThomas Patty Sermons, MDThomas Cornett,     MDLauretta Odogwu, MD           n.     Rosalie DoctorVania Rea. Akira Perusse at 02/09/2010 11:42 AM           Leilani Merl, [id number]  Note: An exclamation mark (!) indicates a result that was not dispersed into the flowsheet. Document Creation Date: 02/09/2010 11:42 AM _______________________________________________________________________  (1) Order result status: Final Collection or observation date-time: 02/09/2010 11:32 Requested date-time:  Receipt date-time:  Reported date-time:  Referring Physician:   Ordering Physician: Sheryn Bison [phone]) Specimen Source:  Source: Launa Grill Order Number: [id number] Lab site:

## 2011-01-28 NOTE — Miscellaneous (Signed)
Summary: [facility]  Clinical Lists Changes  Medications: Added new medication of MOVIPREP 100 GM  SOLR (PEG-KCL-NACL-NASULF-NA ASC-C) As per prep instructions. - Signed Rx of MOVIPREP 100 GM  SOLR (PEG-KCL-NACL-NASULF-NA ASC-C) As per prep instructions.;  #1 x 0;  Signed;  Entered by: [name] RN;  Authorized by: Mardella Layman MD [facility];  Method used: Electronically to [facility]*, [address], [place], [place]  [address], Ph: [phone] or [phone], Fax: [phone] Observations: Added new observation of ALLERGY REV: Done (01/26/2010 10:35)    Prescriptions: MOVIPREP 100 GM  SOLR (PEG-KCL-NACL-NASULF-NA ASC-C) As per prep instructions.  #1 x 0   Entered by:   [name] RN   Authorized by:   Mardella Layman MD [facility]   Signed by:   [name] RN on 01/26/2010   Method used:   Electronically to        [facility]* (retail)       [address] [place], [place]  [address]       Ph: [phone] or [phone]       Fax: [phone]   RxID:   [id number]

## 2011-01-28 NOTE — Letter (Signed)
Summary: [facility] Instructions  [facility]  [address] [place], [place] [address]   Phone: [phone]  Fax: [phone]       Nicholas [name]    1934/12/25    MRN: [id number]        Procedure Day /Date:  Monday 02/09/2010     Arrival Time:  10:00 am      Procedure Time: 11:00 am     Location of Procedure:                    _x _   Endoscopy Center (4th Floor)                        PREPARATION FOR COLONOSCOPY WITH MOVIPREP   Starting 5 days prior to your procedure Wednesday 2/9 do not eat nuts, seeds, popcorn, corn, beans, peas,  salads, or any raw vegetables.  Do not take any fiber supplements (e.g. Metamucil, Citrucel, and Benefiber).  THE DAY BEFORE YOUR PROCEDURE         DATE: Sunday 2/13  1.  Drink clear liquids the entire day-NO SOLID FOOD  2.  Do not drink anything colored red or purple.  Avoid juices with pulp.  No orange juice.  3.  Drink at least 64 oz. (8 glasses) of fluid/clear liquids during the day to prevent dehydration and help the prep work efficiently.  CLEAR LIQUIDS INCLUDE: Water Jello Ice Popsicles Tea (sugar ok, no milk/cream) Powdered fruit flavored drinks Coffee (sugar ok, no milk/cream) Gatorade Juice: apple, white grape, white cranberry  Lemonade Clear bullion, consomm, broth Carbonated beverages (any kind) Strained chicken noodle soup Hard Candy                             4.  In the morning, mix first dose of MoviPrep solution:    Empty 1 Pouch A and 1 Pouch B into the disposable container    Add lukewarm drinking water to the top line of the container. Mix to dissolve    Refrigerate (mixed solution should be used within 24 hrs)  5.  Begin drinking the prep at 5:00 p.m. The MoviPrep container is divided by 4 marks.   Every 15 minutes drink the solution down to the next mark (approximately 8 oz) until the full liter is complete.   6.  Follow completed prep with 16 oz of clear liquid of your choice (Nothing  red or purple).  Continue to drink clear liquids until bedtime.  7.  Before going to bed, mix second dose of MoviPrep solution:    Empty 1 Pouch A and 1 Pouch B into the disposable container    Add lukewarm drinking water to the top line of the container. Mix to dissolve    Refrigerate  THE DAY OF YOUR PROCEDURE      DATE: Monday 2/14  Beginning at 6:00 am (5 hours before procedure):         1. Every 15 minutes, drink the solution down to the next mark (approx 8 oz) until the full liter is complete.  2. Follow completed prep with 16 oz. of clear liquid of your choice.    3. You may drink clear liquids until 9:00 am (2 HOURS BEFORE PROCEDURE).   MEDICATION INSTRUCTIONS  Unless otherwise instructed, you should take regular prescription medications with a small sip of water   as early as possible the morning  of your procedure.    Additional medication instructions:  Do not take Lasix the day of the procedure.         OTHER INSTRUCTIONS  You will need a responsible adult at least 75 years of age to accompany you and drive you home.   This person must remain in the waiting room during your procedure.  Wear loose fitting clothing that is easily removed.  Leave jewelry and other valuables at home.  However, you may wish to bring a book to read or  an iPod/MP3 player to listen to music as you wait for your procedure to start.  Remove all body piercing jewelry and leave at home.  Total time from sign-in until discharge is approximately 2-3 hours.  You should go home directly after your procedure and rest.  You can resume normal activities the  day after your procedure.  The day of your procedure you should not:   Drive   Make legal decisions   Operate machinery   Drink alcohol   Return to work  You will receive specific instructions about eating, activities and medications before you leave.    The above instructions have been reviewed and explained to me by    [name] RN  January 26, 2010 11:33 AM     I fully understand and can verbalize these instructions _____________________________ Date _________

## 2011-01-28 NOTE — Letter (Signed)
Summary: [facility]   Imported By: Sherian Rein 09/23/2010 13:54:10  _____________________________________________________________________  External Attachment:    Type:   Image     Comment:   External Document

## 2011-01-28 NOTE — Progress Notes (Signed)
Summary: REfill Protonix  Phone Note From Pharmacy   Caller: [facility]* Summary of Call: Refill requested on pantoprazole Initial call taken by: Ashok Cordia RN,  May 01, 2010 4:03 PM    Prescriptions: PROTONIX 40 MG  TBEC (PANTOPRAZOLE SODIUM) 1 each day 30 minutes before meal... NO FUTHER REFILLS UNTIL OFFICE VISIT  #30 x 11   Entered by:   Ashok Cordia RN   Authorized by:   Mardella Layman MD [facility]   Signed by:   Ashok Cordia RN on 05/01/2010   Method used:   Electronically to        [facility]* (retail)       [address] [place], [place]  [address]       Ph: [phone] or [phone]       Fax: [phone]   RxID:   [id number]

## 2011-01-28 NOTE — Letter (Signed)
Summary: Previsit letter  [facility]  [address] [place], [place] [address]   Phone: [phone]  Fax: [phone]       01/12/2010 MRN: [id number]  Nicholas [name] [address] [place], [place]  [address]  Dear Mr. Vorndran,  Welcome to the Gastroenterology Division at [facility].    You are scheduled to see a nurse for your pre-procedure visit on 01-26-10 at 11am on the 3rd floor at [facility], [address]  We ask that you try to arrive at our office 15 minutes prior to your appointment time to allow for check-in.  Your nurse visit will consist of discussing your medical and surgical history, your immediate family medical history, and your medications.    Please bring a complete list of all your medications or, if you prefer, bring the medication bottles and we will list them.  We will need to be aware of both prescribed and over the counter drugs.  We will need to know exact dosage information as well.  If you are on blood thinners (Coumadin, Plavix, Aggrenox, Ticlid, etc.) please call our office today/prior to your appointment, as we need to consult with your physician about holding your medication.   Please be prepared to read and sign documents such as consent forms, a financial agreement, and acknowledgement forms.  If necessary, and with your consent, a friend or relative is welcome to sit-in on the nurse visit with you.  Please bring your insurance card so that we may make a copy of it.  If your insurance requires a referral to see a specialist, please bring your referral form from your primary care physician.  No co-pay is required for this nurse visit.     If you cannot keep your appointment, please call [phone] to cancel or reschedule prior to your appointment date.  This allows [place] the opportunity to schedule an appointment for another patient in need of care.    Thank you for choosing [facility] for your medical needs.  We  appreciate the opportunity to care for you.  Please visit [place] at our website  to learn more about our practice.                     Sincerely.                                                                                                                   The Gastroenterology Division

## 2011-01-28 NOTE — Letter (Signed)
[facility]   Imported By: Wilder Glade 07/01/2010 16:02:01  _____________________________________________________________________  External Attachment:    Type:   Image     Comment:   External Document

## 2011-01-28 NOTE — Letter (Signed)
[facility]   Imported By: Sherian Rein 02/05/2010 10:36:28  _____________________________________________________________________  External Attachment:    Type:   Image     Comment:   External Document

## 2011-01-28 NOTE — Letter (Signed)
Summary: [facility]   Imported By: Lester Georgetown 03/10/2010 08:35:23  _____________________________________________________________________  External Attachment:    Type:   Image     Comment:   External Document

## 2011-01-28 NOTE — Letter (Signed)
[facility]   Imported By: Sherian Rein 05/11/2010 08:16:17  _____________________________________________________________________  External Attachment:    Type:   Image     Comment:   External Document

## 2011-04-20 ENCOUNTER — Other Ambulatory Visit: Payer: Self-pay | Admitting: Hematology and Oncology

## 2011-04-20 ENCOUNTER — Encounter (HOSPITAL_BASED_OUTPATIENT_CLINIC_OR_DEPARTMENT_OTHER): Payer: Medicare Other | Admitting: Hematology and Oncology

## 2011-04-20 DIAGNOSIS — C20 Malignant neoplasm of rectum: Secondary | ICD-10-CM

## 2011-04-20 LAB — CBC WITH DIFFERENTIAL/PLATELET
BASO%: 0.5 % (ref 0.0–2.0)
EOS%: 1.8 % (ref 0.0–7.0)
HCT: 38.1 % — ABNORMAL LOW (ref 38.4–49.9)
LYMPH%: 11.8 % — ABNORMAL LOW (ref 14.0–49.0)
MCH: 31.2 pg (ref 27.2–33.4)
MCHC: 33.6 g/dL (ref 32.0–36.0)
MCV: 92.7 fL (ref 79.3–98.0)
MONO%: 6.3 % (ref 0.0–14.0)
NEUT%: 79.6 % — ABNORMAL HIGH (ref 39.0–75.0)
Platelets: 128 10*3/uL — ABNORMAL LOW (ref 140–400)
RBC: 4.11 10*6/uL — ABNORMAL LOW (ref 4.20–5.82)
WBC: 6.3 10*3/uL (ref 4.0–10.3)

## 2011-04-20 LAB — COMPREHENSIVE METABOLIC PANEL
ALT: 15 U/L (ref 0–53)
AST: 27 U/L (ref 0–37)
Alkaline Phosphatase: 68 U/L (ref 39–117)
CO2: 27 mEq/L (ref 19–32)
Creatinine, Ser: 1 mg/dL (ref 0.40–1.50)
Sodium: 141 mEq/L (ref 135–145)
Total Bilirubin: 0.7 mg/dL (ref 0.3–1.2)
Total Protein: 6.2 g/dL (ref 6.0–8.3)

## 2011-04-20 LAB — IRON AND TIBC
%SAT: 26 % (ref 20–55)
TIBC: 296 ug/dL (ref 215–435)
UIBC: 220 ug/dL

## 2011-04-20 LAB — CEA: CEA: 1 ng/mL (ref 0.0–5.0)

## 2011-04-23 ENCOUNTER — Encounter (HOSPITAL_BASED_OUTPATIENT_CLINIC_OR_DEPARTMENT_OTHER): Payer: Medicare Other | Admitting: Hematology and Oncology

## 2011-04-23 ENCOUNTER — Other Ambulatory Visit: Payer: Self-pay | Admitting: Hematology and Oncology

## 2011-04-23 DIAGNOSIS — K5730 Diverticulosis of large intestine without perforation or abscess without bleeding: Secondary | ICD-10-CM

## 2011-04-23 DIAGNOSIS — D638 Anemia in other chronic diseases classified elsewhere: Secondary | ICD-10-CM

## 2011-04-23 DIAGNOSIS — Z9049 Acquired absence of other specified parts of digestive tract: Secondary | ICD-10-CM

## 2011-04-23 DIAGNOSIS — C20 Malignant neoplasm of rectum: Secondary | ICD-10-CM

## 2011-04-28 ENCOUNTER — Other Ambulatory Visit: Payer: Self-pay | Admitting: *Deleted

## 2011-04-28 MED ORDER — PANTOPRAZOLE SODIUM 40 MG PO TBEC: DELAYED_RELEASE_TABLET | ORAL | Status: DC

## 2011-05-11 NOTE — Discharge Summary (Signed)
NAME:  Nicholas [name], Nicholas [name] NO.:  [id number]   MEDICAL RECORD NO.:  [id number]          PATIENT TYPE:  INP   LOCATION:  1534                         FACILITY:  [facility]   PHYSICIAN:  [name], M.D.DATE OF BIRTH:  1934-09-09   DATE OF ADMISSION:  06/01/2007  DATE OF DISCHARGE:  06/10/2007                               DISCHARGE SUMMARY   ADMITTING DIAGNOSES:  1. Adenocarcinoma of rectum.  2. History of coronary artery disease.  3. History of cardiac bypass surgery in the past.  4. History of oliguria.  5. Hypertension.   DISCHARGE DIAGNOSES:  1. Adenocarcinoma of rectum.  2. History of coronary artery disease.  3. History of cardiac bypass surgery in the past.  4. History of oliguria.  5. Hypertension.   PROCEDURES PERFORMED:  Abdominoperineal resection.   BRIEF HISTORY:  The patient is a pleasant 77 year old male who had a  large T3N0MX adenocarcinoma approximately 4 cm from the dentate line.  He underwent neoadjuvant chemotherapy and radiation therapy and presents  for resection.  Please see op note for details.   HOSPITAL COURSE:  The patient underwent an APR due to the low-lying  nature of his lesion, inability to get the lesion through the abdominal  approach.  He was admitted to the intensive care unit.  He was there for  two postop days and transferred to the floor.  He had a slow ileus and  this slowly resolved.  He had a pelvic drain which was taken out on  postoperative day 7.  He had some urinary retention and Dr. Andee Poles saw  him for that on postop day #7 as well after removal of his catheter.  He  had a small wound infection as well.  This was opened at the bedside and  packed by Dr. Gerrit Friends.  He otherwise did well.  He was discharged home on  June 10, 2007 in satisfactory condition.  Pathology showed no residual  tumor at his previous site.   DISCHARGE INSTRUCTIONS:  He will follow-up in 1 week.  Home health will  help with the wound care  to his midline incision.  He will resume his  regular diet.  He will be given instructions on ostomy care and home  health can aid with that.   CONDITION ON DISCHARGE:  Improved.      [name], M.D.  Electronically Signed     TAC/MEDQ  D:  06/20/2007  T:  06/21/2007  Job:  [id number]

## 2011-05-11 NOTE — Op Note (Signed)
NAME:  Nicholas [name], [name]                ACCOUNT NO.:  [id number]   MEDICAL RECORD NO.:  [id number]          PATIENT TYPE:  AMB   LOCATION:  DSC                          FACILITY:  [facility]   PHYSICIAN:  [name], M.D.DATE OF BIRTH:  1934/04/06   DATE OF PROCEDURE:  06/18/2009  DATE OF DISCHARGE:                               OPERATIVE REPORT   PREOPERATIVE DIAGNOSIS:  History of colon cancer with indwelling Port-A-  Cath.   POSTOPERATIVE DIAGNOSIS:  History of colon cancer with indwelling Port-A-  Cath.   PROCEDURE:  Explantation of Port-A-Cath.   SURGEON:  Maisie Fus A. Cornett, MD   ANESTHESIA:  MAC with 0.25% Sensorcaine, local.   ESTIMATED BLOOD LOSS:  10 mL.   SPECIMEN:  None.   INDICATIONS FOR PROCEDURE:  The patient is a 77 year old male who has  completed chemotherapy after having rectal cancer requiring resection of  this.  He is here today to have his Port-A-Cath removed.   DESCRIPTION OF PROCEDURE:  The patient was brought to the operating room  and placed supine.  After induction of MAC anesthesia, the right  subclavian region was prepped and draped in a sterile fashion.  Incision  was made through his previous incision.  The Port-A-Cath was identified.  I grabbed the catheter and removed the catheter carefully, kept my  finger over the tract.  The entire catheter was removed.  The actual  port itself was excised out with the scalpel blade and passed off the  field.  There was a small area just below the incision which felt hard  and I used the scalpel to open this and this turned out to be a small  area of scar tissue.  Hemostasis was achieved.  I closed the tract site  with 3-0 Vicryl and the deep layers with 3-0 Vicryl.  The 4-0 Monocryl  was used to close both skin incisions.  All final counts of sponge,  needle, and instruments were found to be correct in this portion of the  case.  The patient was then awoke and taken to recovery in satisfactory   condition.      [name], M.D.  Electronically Signed     TAC/MEDQ  D:  06/18/2009  T:  06/19/2009  Job:  [id number]   cc:   Vicente Serene I. Odogwu, M.D.

## 2011-05-11 NOTE — H&P (Signed)
NAME:  Nicholas [name], Nicholas [name] NO.:  [id number]   MEDICAL RECORD NO.:  [id number]          PATIENT TYPE:  INP   LOCATION:  1534                         FACILITY[facility]   PHYSICIAN:  [name], M.D.DATE OF BIRTH:  [birth year]   DATE OF ADMISSION:  06/01/2007  DATE OF DISCHARGE:  06/10/2007                              HISTORY & PHYSICAL   CHIEF COMPLAINT:  Rectal cancer.   HISTORY OF PRESENT ILLNESS:  The patient is a 77 year old male who was  initially seen in February 2008 due to a T3 N0 MX rectal cancer.  He  underwent neoadjuvant chemotherapy.  He presented with rectal bleeding.  He has now completed chemotherapy presents for abdominoperineal  resection.   PAST MEDICAL HISTORY:  1. Hypertension.  2. Hypercholesterolemia.  3. History of myocardial infarction.  4. History of CABG.  5. BPH.   PAST SURGICAL HISTORY:  1. CABG.  2. Shoulder surgery.   MEDICATIONS:  1. Lasix 40 mg daily.  2. Toprol 50 grams daily.  3. Cardura 8 mg daily.  4. Vytorin 10/20 mg daily.  5. Aspirin 81 mg daily.  6. Avodart 0.5 mg daily.  7. Lotrel 5/20 daily.  8. Saw palmetto.   ALLERGIES TO MEDICINES:  SULFA DRUGS.   SOCIAL HISTORY:  Denies tobacco or alcohol use.  He does drive a bus.   FAMILY HISTORY:  Positive for pancreatic cancer.  Denies colon cancer.  He has a son with diabetes.   REVIEW OF SYSTEMS:  Is contained in the original consultation. Please  see this.   PHYSICAL EXAMINATION:  VITAL SIGNS:  Temperature 98, pulse 73, blood  pressure 98/62.  GENERAL APPEARANCE:  Pleasant male. in no apparent distress.  HEENT: Extraocular movements are intact.  Oropharynx clear.  NECK:  Supple, nontender.  CHEST:  Clear to auscultation.  CARDIOVASCULAR:  Regular rate and rhythm, without rub, murmur, or  gallop.  ABDOMEN:  Soft, nontender, without mass.  RECTAL:  Reveals residual tumor at the tip of my finger, which is about  4-5 cm.  EXTREMITIES:  No clubbing,  cyanosis, or edema.   IMPRESSION:  T3 N0 MX adenocarcinoma of the rectum by ultrasound grade  2, status post neoadjuvant chemotherapy and radiation therapy.   PLAN:  He will go to surgery today for possible abdominoperineal  resection versus low anterior resection if we can get this.      [name], M.D.  Electronically Signed     TAC/MEDQ  D:  06/20/2007  T:  06/21/2007  Job:  [id number]

## 2011-05-11 NOTE — Op Note (Signed)
NAME:  Nicholas [name], Nicholas [name]                ACCOUNT NO.:  [id number]   MEDICAL RECORD NO.:  [id number]          PATIENT TYPE:  INP   LOCATION:  0001                         FACILITY:  [facility]   PHYSICIAN:  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   PROCEDURE:  Abdominoperineal resection. Rigid sigmoidoscopy.   SURGEON:  Dr. Harriette Bouillon.   ANESTHESIA:  General endotracheal anesthesia.   ESTIMATED BLOOD LOSS:  250 mL.   ASSISTANT:  Dr. Chevis Pretty.   DRAIN:  A 19 Blake drain to pelvis.   SPECIMEN:  Distal sigmoid colon and rectum and anus to pathology.  Tumor  is identified approximately 4 cm.  The dentate line has a small 1-cm  scar.   INDICATIONS FOR PROCEDURE:  The patient is a 77 year old male who  presents for potential low anterior resection or APR due to a rectal  cancer.  This was identified about 4 cm above his dentate line anterior.  He underwent chemotherapy and radiation therapy with good response but  is here today for elective surgery.  It was unclear to me if I was going  to be able to do a low anterior resection but attempt was made to get as  low as we could to resect this for him.  If not, I told him he would  need an abdominoperineal resection.  He was marked preoperatively for  his ostomy.  He understood the above and agreed to proceed.   DESCRIPTION OF PROCEDURE:  The patient brought to the operating room and  placed supine.  After induction of general anesthesia, he was placed in  stirrups in the lithotomy position.  Foley catheter was placed.  His  perineum and abdomen were then prepped and draped in a sterile fashion.  Left arm was tucked.  Right arm was put out.  He received preoperative  antibiotics.  Midline  incision was used from just above the umbilicus  down to the pubic symphysis.  We dissected down through the midline into  the abdominal cavity.  Upon entering the abdominal cavity, I palpated  and felt no evidence of any metastatic disease  involving his liver or  in the mesentery.  We then used a Bookwalter retractor.  Of note, he had  a hematoma involving the mesentery of his distal ileum.  We had not yet  manipulated this area when we found this hematoma, and the etiology of  the hematoma in his terminal ileum is unclear.  This did not seem to be  an acute process either.  It was not a mass or anything that I could  biopsy, so I elected to just watch it as the case went along to  reexamined it and make sure it was not expanding.  The bowel itself was  not compromised.  After packing the small bowel into the patient's right  upper quadrant, the patient was placed in Trendelenburg.  I was able to  run my hand down anteriorly but could not feel the mass.  Unfortunately,  he had a very deep narrow pelvis, and there was quite a bit of fat in  his retroperitoneum.  I divided the sigmoid colon distally with a GIA 75  stapler.  We then mobilized the sigmoid colon and left colon from the  intraperitoneal structures.  Left ureter was identified.  We took great  care to stay away from it.  Once the structures were identified, we then  took the LigaSure down the mesentery of the distal sigmoid colon, taking  the superior rectal vessels at the origin of these, coming off the  inferior mesenteric artery.  We then elevated the rectum anteriorly and  posteriorly using the LigaSure.  Right ureter was never visualized, but  we stayed well away from this.  We scored the peritoneum of the  mesentery of the rectum down both right and left sides.  LigaSure was  used.  We took this down as we got into the avascular plane just  anterior to the sacrum.  We were able to sweep all the way down until we  found  felt the tip of the coccyx.  We then worked laterally and used  LigaSure take down the lateral stalks as low as we could.  Both lateral  stalks were taken down the LigaSure.  We then worked anteriorly, and  this was extremely difficult.  I was able to retract the bladder with a  [employer].  We opened the peritoneal lining and then worked  anteriorly all the way down until we could feel the prostate gland.  Rigid sigmoidoscopy was then performed.  The tumor was at about 4 cm.  We could see the very tip of the of the scope and feel this with our  finger tip.  Once we mobilized all this, it was very difficult, but we  attempted to place a TA stapler to see if we could just get distal to  the tumor to try to preserve his continence.  This took an extremely  long time of manipulating.  We finally thinned the mesentery right  around where the tumor was to get a stapler across which we did.  We  fired  the [employer].  Unfortunately, we were unable to get the tumor  in the specimen.  At this point in time, we did not feel that we could  do a low anterior resection, because we had gotten as low as we could  get, but we were going to be unable to resect this without doing an APR  given low nature of this lesion.   At this point, I went to the peritoneum.  The Gelpi retractors were  used, and the anus was grasped with Allis clamps.  Incision with cautery  made circumferentially around the anoderm and dissection around the anus  begun.  I divided the floor of the pelvic muscles using cautery and  actually got posterior, and then we connected internally and externally  to the posterior communication just over the coccyx and placed a small  malleable.  We then worked laterally to mobilize the rectum with the  cautery with great difficulty, but we were able to do that.  Anteriorly,  he was stuck, and I had to be very careful dissecting and staying right on the rectal wall.  The space between  the urethra and  the rectum was  quite inflamed from radiation, and we took great care to stay away from  the urethra which we did not encounter.  We could feel it but did not  see catheter.  There was considerable inflammation here, and it made the  dissection extremely difficult.  We were able to get past the prostate  and the seminal vesicles and preserve these.  There was no bleeding from  the prostate or seminal vesicles.  We were then able to pull the distal  rectum out externally and then used cautery to amputate the specimen.  This was sent to pathology.  I opened it on the back table and found the  scar.  It was 2 cm from the  staple line and 4 cm from the dentate line.  At this point, I irrigated out the perineum.  Hemostasis was very good.  I closed the muscles of the pelvic floor with 0-0 Vicryl.  I then closed  the skin with 3-0 nylon.  Prior to this, I placed a drain to the left  buttock cheek into the pelvis under direct vision.  This was secured to  the skin with 3-0 nylon.   We then returned to the abdomen.  The pelvis was quite deep.  Attempt  was made to close the peritoneal lining, but there was too much tension,  and we took the sutures out.  I then mobilized a large tongue of omentum  and was able to place this down where the rectum was quite nicely with  no tension.  This was a well vascularized pedicle and closed up the  space I thought very nicely.  I then placed surgical wrap on top of this  to hopefully keep the bowel from sticking and adhering to this.  We saw  no signs of bladder injury, and both ureters were visualized and were  not injured during the procedure.  We then irrigated and suctioned out  all excess suction.  We had the patient marked preoperatively in the  left lower quadrant for an ostomy.  Kocher was used to excise the skin  at this point in left lower quadrant.  Dissection was carried down  through the abdominal wall.  The muscles of the rectus  abdominis were  split in their direction with my fingers.  I made an ostomy large enough  for two fingerbreadths.  We then examined the colon.  We decided to take  some more of the sigmoid colon since we had excess colon.  We did this  with a LigaSure and an additional firing of the GIA 75 stapler to  amputate the colon at the distal descending colon.  This pulled up quite  nicely through the abdominal wall using a Babcock.  Once this was in  place, we counted sponges and found all counts be correct.  I reexamined  the hematoma involving the mesentery of the ileum.  This was not  expanding at this point time.  We then checked for sponges and  instruments and found these all to be correct.  We then closed the  fascia with a running double-stranded #1 PDS.  Skin staples were used to  close skin.  Ostomy matured with 4-0  Monocryl.  It was pink and viable.  Appliance and dry dressings were applied.  JP drains placed to bulb suction.  The patient was then awoke  and taken to recovery in satisfactory condition.  All final counts of  sponge, needle and instruments  found to be correct.  The patient was  taken to recovery in satisfactory condition.      [name], M.D.  Electronically Signed     TAC/MEDQ  D:  06/01/2007  T:  06/01/2007  Job:  [id number]   cc:   Wilhemina Bonito. Marina Goodell, MD  [address]  [place]  [place] [address]   Vicente Serene I. Odogwu, M.D.  Fax: [phone]

## 2011-05-14 ENCOUNTER — Encounter: Payer: Self-pay | Admitting: Gastroenterology

## 2011-05-14 ENCOUNTER — Ambulatory Visit (INDEPENDENT_AMBULATORY_CARE_PROVIDER_SITE_OTHER): Payer: Medicare Other | Admitting: Gastroenterology

## 2011-05-14 VITALS — BP 132/80 | HR 60 | Ht 70.0 in | Wt 217.0 lb

## 2011-05-14 DIAGNOSIS — K469 Unspecified abdominal hernia without obstruction or gangrene: Secondary | ICD-10-CM | POA: Insufficient documentation

## 2011-05-14 DIAGNOSIS — Z85048 Personal history of other malignant neoplasm of rectum, rectosigmoid junction, and anus: Secondary | ICD-10-CM

## 2011-05-14 DIAGNOSIS — K219 Gastro-esophageal reflux disease without esophagitis: Secondary | ICD-10-CM | POA: Insufficient documentation

## 2011-05-14 MED ORDER — PANTOPRAZOLE SODIUM 40 MG PO TBEC: DELAYED_RELEASE_TABLET | ORAL | Status: DC

## 2011-05-14 NOTE — Patient Instructions (Signed)
Your prescription(s) have been sent to you pharmacy.  Please go to the basement today for your labs.   

## 2011-05-14 NOTE — Assessment & Plan Note (Signed)
[facility]                         GASTROENTEROLOGY OFFICE NOTE   Nicholas [name], Nicholas [name]                       MRN:          [id number]  DATE:01/24/2007                            DOB:          Aug 21, 1934    Nicholas [name] is a very pleasant, 77 year old white male self-referred today  for evaluation of rectal bleeding.   Nicholas [name] has had some intermittent rectal bleeding and mild  constipation for the last 6 weeks.  In the past, he has been told that  he had hemorrhoids and irritable bowel syndrome, but I cannot see  where he has ever had colonoscopy or barium enema exams.  He does have  some chronic indigestion and heartburn for which he takes p.r.n.  antacids but denies dysphagia or hepatobiliary complaints.  His appetite  is good and his weight is stable.  He has never had hepatitis or  pancreatitis.   PAST MEDICAL HISTORY:  Remarkable for hypertensive cardiovascular  disease with a MI in 1993.  He also had hypercholesterolemia.  He had  bypass surgery.  He is followed by Nicholas [name].  He did have  shoulder surgery in August of this past year after taking a fall.   MEDICATIONS:  1. Lasix 40 mg daily.  2. Toprol 50 mg daily.  3. Cardura 8 mg daily.  4. Vytorin 10/20 mg daily.  5. Aspirin 81 mg daily.  6. Avodart 0.5 mg daily.  7. Lotrel 5/20 mg daily.  8. Potassium 20 mEq daily.  9. A variety of multivitamins.  10.Saw Palmetto.   He does have a history of BPH.   FAMILY HISTORY:  Remarkable for pancreatic cancer in his father.  There  are no known cases of colon polyps or carcinoma.  His son does have  diabetes.   SOCIAL HISTORY:  He is married and lives with his wife.  He is retired  from the [employer].  Currently, he works as a Midwife.  He  has a high school education.  He does not smoke or use ethanol but does  chew tobacco.   REVIEW OF SYSTEMS:  Negative without current cardiovascular or pulmonary  complaints.   He does have some pain in his left shoulder and low back  pain but denies NSAID use.  Review of systems otherwise noncontributory.   EXAMINATION:  He is a healthy-appearing, white male, appearing his  stated age in no acute distress.  He is 5 feet 10 inches tall and weighs  207 pounds.  Blood pressure 102/62 and pulse was 80 and regular.  I could not appreciate stigmata of chronic liver disease.  His chest was clear and he appeared to be in a regular rhythm without  significant murmurs, gallops, or rubs.  I could not appreciate hepatosplenomegaly, abdominal masses, or  tenderness.  Bowel sounds were normal.  Peripheral extremities were unremarkable.  Inspection of the rectum was unremarkable.  Rectal exam showed a firm,  hard mass at the tip of my digit which was friable and produced bright  red blood to exam.   ASSESSMENT:  I think Mr. Manganaro  most likely has rectal carcinoma  accounting for his symptomatology.  He seems to be stable from a  cardiovascular standpoint at this time and should be able to tolerate  colonoscopy with sedation and observation.   RECOMMENDATIONS:  I have asked Nicholas [name] to do his colonoscopy  since I will be on vacation in the next couple of weeks.  The risks and  benefits of colonoscopy were explained in detail.  He has agreed to  proceed as planned.  Depending on the colonoscopy reports results, Dr.  Marina Goodell may decide to do endoscopy to rule out Barrett's mucosa  additionally in this patient's case as he has had chronic acid reflux  and 77 years of age.     [name], MD, [name], FAGA  Electronically Signed    DRP/MedQ  DD: 01/24/2007  DT: 01/24/2007  Job #: [id number]   cc:   [name], M.D.  Wilhemina Bonito. Marina Goodell, MD  Windle Guard, MD

## 2011-05-14 NOTE — Progress Notes (Signed)
This is a 77 year old Caucasian male with previous rectal cancer requiring AP resection with colostomy in 2008. All of colonoscopy through his colostomy one year later was unremarkable except for some remaining diverticulosis. His had a para colostomy hernia and also an incisional suprapubic hernia, but has elected not to have these repaired by Dr. Luisa Hart . He also has chronic acid reflux disease for which she takes Protonix 40 mg a day. He denies chest pain, dysphagia, melena or hematochezia or any other gastrointestinal issues at this time. His appetite is good and his weight is stable. He is due for followup colonoscopy next year. He has had previous endoscopy which did not show evidence of Barrett's mucosa. He is followed by Dr. Dalene Carrow in oncology and Dr. [name] in cardiology.  Current Medications, Allergies, Past Medical History, Past Surgical History, Family History and Social History were reviewed in [employer] record.  Pertinent Review of Systems Negative.. he denies current cardiovascular or pulmonary complaints.   Physical Exam: Awake alert no acute distress appearing his stated age. His abdominal exam shows a colostomy in the left upper quadrant which seems to be functioning well. I did not remove his colostomy bag today for examination. Her as a thick suprapubic scar noted without masses or tenderness. Abdominal exam is otherwise unremarkable. Mental status is normal.      Assessment and Plan: Nikos is doing well on daily protonic for the acid reflux. He is due for followup colonoscopy next year. I've asked him to do IFOB 2 cars for human hemoglobin. He has his regular lab work performed by oncology and surgery. I reviewed standard anti-reflex measures with him. I also encouraged him to continue his other medications per his multiple physicians.

## 2011-05-21 ENCOUNTER — Other Ambulatory Visit: Payer: Medicare Other

## 2011-05-25 ENCOUNTER — Other Ambulatory Visit (INDEPENDENT_AMBULATORY_CARE_PROVIDER_SITE_OTHER): Payer: Medicare Other | Admitting: Gastroenterology

## 2011-05-25 DIAGNOSIS — Z1289 Encounter for screening for malignant neoplasm of other sites: Secondary | ICD-10-CM

## 2011-05-26 ENCOUNTER — Encounter: Payer: Self-pay | Admitting: Cardiology

## 2011-05-26 ENCOUNTER — Other Ambulatory Visit: Payer: Self-pay | Admitting: *Deleted

## 2011-05-26 ENCOUNTER — Telehealth: Payer: Self-pay | Admitting: *Deleted

## 2011-05-26 ENCOUNTER — Encounter: Payer: Self-pay | Admitting: *Deleted

## 2011-05-26 DIAGNOSIS — E7800 Pure hypercholesterolemia, unspecified: Secondary | ICD-10-CM

## 2011-05-26 NOTE — Telephone Encounter (Signed)
Message copied by Florene Glen on Wed May 26, 2011  8:17 AM ------      Message from: Mardella Layman      Created: Tue May 25, 2011  5:04 PM       Needs colonoscopy now per positive Hemoccults.

## 2011-05-26 NOTE — Telephone Encounter (Signed)
Spoke with pt to inform him of the need for COLON for positive hemoccults. Pt stated he often has bleeding around his stoma and he thinks that is the source of blood. He is going on vacation 05/28/11 to 06/04/11. Dr Jarold Motto is off the next week. Pt scheduled for COLON on 06/14/11 at 4pm and his Pre Visit is 06/09/11 at 2pm. Letter mailed to pt with appointments.

## 2011-05-27 ENCOUNTER — Ambulatory Visit (INDEPENDENT_AMBULATORY_CARE_PROVIDER_SITE_OTHER): Payer: Medicare Other | Admitting: Cardiology

## 2011-05-27 ENCOUNTER — Other Ambulatory Visit (INDEPENDENT_AMBULATORY_CARE_PROVIDER_SITE_OTHER): Payer: Medicare Other | Admitting: *Deleted

## 2011-05-27 ENCOUNTER — Encounter: Payer: Self-pay | Admitting: Cardiology

## 2011-05-27 VITALS — BP 120/70 | HR 57 | Wt 219.0 lb

## 2011-05-27 DIAGNOSIS — I493 Ventricular premature depolarization: Secondary | ICD-10-CM

## 2011-05-27 DIAGNOSIS — E7800 Pure hypercholesterolemia, unspecified: Secondary | ICD-10-CM

## 2011-05-27 DIAGNOSIS — I4949 Other premature depolarization: Secondary | ICD-10-CM

## 2011-05-27 DIAGNOSIS — Z9889 Other specified postprocedural states: Secondary | ICD-10-CM

## 2011-05-27 DIAGNOSIS — Z951 Presence of aortocoronary bypass graft: Secondary | ICD-10-CM | POA: Insufficient documentation

## 2011-05-27 DIAGNOSIS — I519 Heart disease, unspecified: Secondary | ICD-10-CM

## 2011-05-27 DIAGNOSIS — I10 Essential (primary) hypertension: Secondary | ICD-10-CM

## 2011-05-27 LAB — BASIC METABOLIC PANEL
Calcium: 9.8 mg/dL (ref 8.4–10.5)
GFR: 73.99 mL/min (ref >60.00)
Glucose, Bld: 112 mg/dL — ABNORMAL HIGH (ref 70–99)
Sodium: 140 mEq/L (ref 135–145)

## 2011-05-27 LAB — LIPID PANEL
Cholesterol: 132 mg/dL (ref 0–200)
HDL: 47.3 mg/dL (ref >39.00)
LDL Cholesterol: 70 mg/dL (ref 0–99)
Total CHOL/HDL Ratio: 3

## 2011-05-27 LAB — HEPATIC FUNCTION PANEL
ALT: 17 U/L (ref 0–53)
Alkaline Phosphatase: 62 U/L (ref 39–117)
Bilirubin, Direct: 0.1 mg/dL (ref 0.0–0.3)
Total Bilirubin: 1 mg/dL (ref 0.3–1.2)
Total Protein: 6.1 g/dL (ref 6.0–8.3)

## 2011-05-27 NOTE — Progress Notes (Signed)
Nicholas [name] Date of Birth:  [birth year] [facility] [address]   [address] [place], [place]  [address] [phone]           Fax   [phone]  History of Present Illness: This pleasant 77 year old gentleman is seen for a scheduled followup office visit.  He has a history of compensated congestive heart failure.  He has a history of ischemic heart disease and had coronary artery bypass graft surgery on 05/27/92.  He had a nuclear stress test for preop clearance for total knee surgery and the stress test was done on 06/09/09 and showed evidence of an old inferior scar but no reversible ischemia and his ejection fraction was 51%.  The patient has a past history of colon cancer and has a colostomy which is functioning well.  Current Outpatient Prescriptions  Medication Sig Dispense Refill  . amLODipine-benazepril (LOTREL) 5-20 MG per capsule Take 1 capsule by mouth daily.        Marland Kitchen aspirin 81 MG tablet Take 81 mg by mouth daily.        . Calcium Carbonate-Vitamin D (CALCIUM 600-D) 600-400 MG-UNIT per tablet Take 1 tablet by mouth daily.        Marland Kitchen doxazosin (CARDURA) 8 MG tablet Take 8 mg by mouth at bedtime.        Marland Kitchen ezetimibe-simvastatin (VYTORIN) 10-20 MG per tablet Take 1 tablet by mouth at bedtime.        . finasteride (PROSCAR) 5 MG tablet Take 5 mg by mouth daily.        . furosemide (LASIX) 40 MG tablet Take 40 mg by mouth daily.        . metoprolol (TOPROL-XL) 50 MG 24 hr tablet Take 50 mg by mouth daily.        . Misc Natural Products (SAW PALMETTO PLUS) CAPS Take 1 capsule by mouth daily.        . pantoprazole (PROTONIX) 40 MG tablet Take one by mouth once a day  30 tablet  12  . vitamin C (ASCORBIC ACID) 500 MG tablet Take 500 mg by mouth daily.        . Vitamins-Lipotropics (B-100 COMPLEX) TABS Take 1 tablet by mouth daily.        Marland Kitchen dutasteride (AVODART) 0.5 MG capsule Take 0.5 mg by mouth daily.        Marland Kitchen DISCONTD: ferrous sulfate 325 (65 FE) MG  tablet Take 325 mg by mouth 3 (three) times daily with meals.          Allergies  Allergen Reactions  . Ciprofloxacin   . Hctz (Hydrochlorothiazide)     DIZZINESS   . Lipitor (Atorvastatin Calcium)     MUSCLE PAIN  . Niaspan (Niacin (Antihyperlipidemic))     FLUSHING   . Oxycodone-Acetaminophen   . Sulfonamide Derivatives     Patient Active Problem List  Diagnoses  . CARCINOMA, RECTUM  . HYPERCHOLESTEROLEMIA  . ANEMIA, IRON DEFICIENCY  . HYPERTENSION  . MYOCARDIAL INFARCTION  . GERD  . DIVERTICULOSIS, COLON  . DEGENERATIVE JOINT DISEASE, GENERALIZED  . COLONIC POLYPS, ADENOMATOUS, HX OF  . Personal history of malignant neoplasm of rectum, rectosigmoid junction, and anus  . GERD (gastroesophageal reflux disease)  . Hernia of abdominal cavity  . Hx of CABG  . Asymptomatic PVCs    History  Smoking status  . Former Smoker  . Quit date: 05/26/1991  Smokeless tobacco  . Current User    History  Alcohol Use No    Family History  Problem Relation Age of Onset  . Pancreatic cancer Father   . Cancer Father   . Diabetes Son   . Emphysema Mother     Review of Systems: Constitutional: no fever chills diaphoresis or fatigue or change in weight.  Head and neck: no hearing loss, no epistaxis, no photophobia or visual disturbance. Respiratory: No cough, shortness of breath or wheezing. Cardiovascular: No chest pain peripheral edema, palpitations. Gastrointestinal: No abdominal distention, no abdominal pain, no change in bowel habits hematochezia or melena.Patient has functioning colostomy. Genitourinary: No dysuria, no frequency, no urgency, no nocturia. Musculoskeletal:No arthralgias, no back pain, no gait disturbance or myalgias. Neurological: No dizziness, no headaches, no numbness, no seizures, no syncope, no weakness, no tremors. Hematologic: No lymphadenopathy, no easy bruising. Psychiatric: No confusion, no hallucinations, no sleep disturbance.    Physical  Exam: Filed Vitals:   05/27/11 0838  BP: 120/70  Pulse: 57  The general appearance reveals a well-developed well-nourished gentleman in no distress.Pupils equal and reactive.   Extraocular Movements are full.  There is no scleral icterus.  The mouth and pharynx are normal.  The neck is supple.  The carotids reveal no bruits.  The jugular venous pressure is normal.  The thyroid is not enlarged.  There is no lymphadenopathy.The chest is clear to percussion and auscultation. There are no rales or rhonchi. Expansion of the chest is symmetrical.The precordium is quiet.  The first heart sound is normal.  The second heart sound is physiologically split.  There is no murmur gallop rub or click.  There is no abnormal lift or heave.  PVCs are present.The abdomen is soft and nontender. Bowel sounds are normal. The liver and spleen are not enlarged. There Are no abdominal masses. There are no bruits.  He has a colostomy in the left lower quadrant.The pedal pulses are good.  There is no phlebitis or edema.  There is no cyanosis or clubbing.  He does have mild pedal edema.Strength is normal and symmetrical in all extremities.  There is no lateralizing weakness.  There are no sensory deficits.The skin is warm and dry.  There is no rash.  EKG shows sinus bradycardia and PVCs in trigeminy   Assessment / Plan: The PVCs are benign.  Continue same medication recheck in 4 months

## 2011-05-27 NOTE — Assessment & Plan Note (Signed)
The patient has a past history of PVCs.  These are asymptomatic.  We are checking electrolytes today.

## 2011-05-27 NOTE — Assessment & Plan Note (Signed)
The patient has a history of ischemic heart disease.  He has not had to take any recent sublingual nitroglycerin.  He's not having symptoms of congestive heart failure.  His weight is down 6 pounds since last visit

## 2011-05-28 ENCOUNTER — Encounter: Payer: Self-pay | Admitting: Cardiology

## 2011-05-28 ENCOUNTER — Telehealth: Payer: Self-pay | Admitting: *Deleted

## 2011-05-28 NOTE — Telephone Encounter (Signed)
Adv. Patient of labs 

## 2011-06-09 ENCOUNTER — Ambulatory Visit (AMBULATORY_SURGERY_CENTER): Payer: Medicare Other | Admitting: *Deleted

## 2011-06-09 VITALS — Ht 70.0 in | Wt 214.3 lb

## 2011-06-09 DIAGNOSIS — K921 Melena: Secondary | ICD-10-CM

## 2011-06-09 MED ORDER — PEG-KCL-NACL-NASULF-NA ASC-C 100 G PO SOLR: ORAL | Status: DC

## 2011-06-14 ENCOUNTER — Encounter: Payer: Self-pay | Admitting: Gastroenterology

## 2011-06-14 ENCOUNTER — Ambulatory Visit (AMBULATORY_SURGERY_CENTER): Payer: Medicare Other | Admitting: Gastroenterology

## 2011-06-14 VITALS — BP 108/68 | HR 68 | Temp 98.1°F | Resp 18 | Ht 70.0 in | Wt 214.0 lb

## 2011-06-14 DIAGNOSIS — Z85048 Personal history of other malignant neoplasm of rectum, rectosigmoid junction, and anus: Secondary | ICD-10-CM

## 2011-06-14 DIAGNOSIS — Z85038 Personal history of other malignant neoplasm of large intestine: Secondary | ICD-10-CM

## 2011-06-14 DIAGNOSIS — K921 Melena: Secondary | ICD-10-CM

## 2011-06-14 MED ORDER — SODIUM CHLORIDE 0.9 % IV SOLN: 500.0000 mL | INTRAVENOUS | Status: DC

## 2011-06-14 NOTE — Patient Instructions (Signed)
See the picture page for colonoscopy findings today.  Please refer to the green and blue papers for the discharge instructions for today.  You may resume your prior medications today.  Call if any questions or concerns.

## 2011-06-14 NOTE — Progress Notes (Signed)
Dixie Doss, Rn applied the pt's colostomy bag.  He felt better passing flatus with it on.  She burped the bag a coupl of times also. MAW  No complaints on discharge.MAW

## 2011-06-15 ENCOUNTER — Telehealth: Payer: Self-pay | Admitting: *Deleted

## 2011-06-15 NOTE — Telephone Encounter (Signed)

## 2011-07-21 ENCOUNTER — Other Ambulatory Visit: Payer: Self-pay | Admitting: *Deleted

## 2011-07-21 DIAGNOSIS — I119 Hypertensive heart disease without heart failure: Secondary | ICD-10-CM

## 2011-07-21 DIAGNOSIS — E785 Hyperlipidemia, unspecified: Secondary | ICD-10-CM

## 2011-07-21 MED ORDER — METOPROLOL SUCCINATE ER 50 MG PO TB24: 50.0000 mg | ORAL_TABLET | Freq: Every day | ORAL | Status: DC

## 2011-07-21 MED ORDER — EZETIMIBE-SIMVASTATIN 10-20 MG PO TABS: 1.0000 | ORAL_TABLET | Freq: Every day | ORAL | Status: DC

## 2011-07-21 NOTE — Telephone Encounter (Signed)
Refilled meds per fax request.  

## 2011-08-11 NOTE — Progress Notes (Signed)
Addended by: Virgel Paling on: 08/11/2011 03:38 PM   Modules accepted: Orders

## 2011-09-21 ENCOUNTER — Encounter: Payer: Self-pay | Admitting: Cardiology

## 2011-09-21 ENCOUNTER — Ambulatory Visit (INDEPENDENT_AMBULATORY_CARE_PROVIDER_SITE_OTHER): Payer: Medicare Other | Admitting: *Deleted

## 2011-09-21 ENCOUNTER — Other Ambulatory Visit: Payer: Self-pay | Admitting: *Deleted

## 2011-09-21 ENCOUNTER — Ambulatory Visit (INDEPENDENT_AMBULATORY_CARE_PROVIDER_SITE_OTHER): Payer: Medicare Other | Admitting: Cardiology

## 2011-09-21 VITALS — BP 138/66 | HR 70 | Wt 223.8 lb

## 2011-09-21 DIAGNOSIS — I10 Essential (primary) hypertension: Secondary | ICD-10-CM

## 2011-09-21 DIAGNOSIS — I119 Hypertensive heart disease without heart failure: Secondary | ICD-10-CM

## 2011-09-21 DIAGNOSIS — N400 Enlarged prostate without lower urinary tract symptoms: Secondary | ICD-10-CM

## 2011-09-21 DIAGNOSIS — Z9889 Other specified postprocedural states: Secondary | ICD-10-CM

## 2011-09-21 DIAGNOSIS — E7800 Pure hypercholesterolemia, unspecified: Secondary | ICD-10-CM

## 2011-09-21 DIAGNOSIS — Z951 Presence of aortocoronary bypass graft: Secondary | ICD-10-CM

## 2011-09-21 LAB — HEPATIC FUNCTION PANEL
ALT: 18 U/L (ref 0–53)
Bilirubin, Direct: 0.2 mg/dL (ref 0.0–0.3)
Total Bilirubin: 1 mg/dL (ref 0.3–1.2)

## 2011-09-21 LAB — BASIC METABOLIC PANEL
Calcium: 9.5 mg/dL (ref 8.4–10.5)
GFR: 76.47 mL/min (ref >60.00)
Potassium: 4.3 mEq/L (ref 3.5–5.1)
Sodium: 141 mEq/L (ref 135–145)

## 2011-09-21 LAB — LIPID PANEL
Cholesterol: 137 mg/dL (ref 0–200)
HDL: 49.5 mg/dL (ref >39.00)
Triglycerides: 63 mg/dL (ref 0.0–149.0)
VLDL: 12.6 mg/dL (ref 0.0–40.0)

## 2011-09-21 NOTE — Assessment & Plan Note (Signed)
The patient has a history of BPH and is followed by urologist in [place].  He is on doxazosin 8 mg a day as well as finasteride 5 mg daily.  He does note some dizziness from the doxazosin.  He will try cutting back to just half a tablet a day which will be 4 mg a day and see if this makes a difference with his dizziness.

## 2011-09-21 NOTE — Assessment & Plan Note (Signed)
The patient has a history of hypercholesterolemia.  He is on Vytorin.  Blood work is pending to

## 2011-09-21 NOTE — Assessment & Plan Note (Signed)
The patient has had no recurrence of angina pectoris.  He is to stay on same medicine

## 2011-09-21 NOTE — Progress Notes (Signed)
Nicholas [name] Date of Birth:  06-30-1934 [facility] [address]   [address] [place], [place]  [address] [phone]           Fax   [phone]  History of Present Illness: This pleasant 77 year old gentleman is seen for a scheduled followup office visit.  He has a history of ischemic heart disease.  He had coronary artery bypass graft surgery on 05/27/92.  He has a history of compensated congestive heart failure.  He had a nuclear stress test for preoperative clearance for total knee surgery on 06/09/09 which showed evidence of an old inferior scar but no reversible ischemia and his ejection fraction was 51%.  The patient has a history of colon cancer and has a functioning colostomy.  Patient also has a history of BPH  Current Outpatient Prescriptions  Medication Sig Dispense Refill  . amLODipine-benazepril (LOTREL) 5-20 MG per capsule Take 1 capsule by mouth daily.        Marland Kitchen aspirin 81 MG tablet Take 81 mg by mouth daily.        . Calcium Carbonate-Vitamin D (CALCIUM 600-D) 600-400 MG-UNIT per tablet Take 1 tablet by mouth daily.        Marland Kitchen doxazosin (CARDURA) 8 MG tablet Take 8 mg by mouth at bedtime.        Marland Kitchen ezetimibe-simvastatin (VYTORIN) 10-20 MG per tablet Take 1 tablet by mouth at bedtime.  30 tablet  5  . finasteride (PROSCAR) 5 MG tablet Take 5 mg by mouth daily.        . furosemide (LASIX) 40 MG tablet Take 40 mg by mouth daily.        . metoprolol (TOPROL-XL) 50 MG 24 hr tablet Take 1 tablet (50 mg total) by mouth daily.  30 tablet  5  . Misc Natural Products (SAW PALMETTO PLUS) CAPS Take 1 capsule by mouth daily.        . pantoprazole (PROTONIX) 40 MG tablet Take one by mouth once a day  30 tablet  12  . vitamin C (ASCORBIC ACID) 500 MG tablet Take 500 mg by mouth daily.        . Vitamins-Lipotropics (B-100 COMPLEX) TABS Take 1 tablet by mouth daily.          Allergies  Allergen Reactions  . Ciprofloxacin   . Hctz (Hydrochlorothiazide)    DIZZINESS   . Lipitor (Atorvastatin Calcium)     MUSCLE PAIN  . Niaspan (Niacin (Antihyperlipidemic))     FLUSHING   . Oxycodone-Acetaminophen   . Sulfonamide Derivatives     Patient Active Problem List  Diagnoses  . CARCINOMA, RECTUM  . HYPERCHOLESTEROLEMIA  . ANEMIA, IRON DEFICIENCY  . HYPERTENSION  . MYOCARDIAL INFARCTION  . GERD  . DIVERTICULOSIS, COLON  . DEGENERATIVE JOINT DISEASE, GENERALIZED  . COLONIC POLYPS, ADENOMATOUS, HX OF  . Personal history of malignant neoplasm of rectum, rectosigmoid junction, and anus  . GERD (gastroesophageal reflux disease)  . Hernia of abdominal cavity  . Hx of CABG  . Asymptomatic PVCs    History  Smoking status  . Former Smoker  . Quit date: 05/26/1991  Smokeless tobacco  . Current User    History  Alcohol Use No    Family History  Problem Relation Age of Onset  . Pancreatic cancer Father   . Cancer Father   . Diabetes Son   . Emphysema Mother     Review of Systems: Constitutional: no fever chills diaphoresis or  fatigue or change in weight.  Head and neck: no hearing loss, no epistaxis, no photophobia or visual disturbance. Respiratory: No cough, shortness of breath or wheezing. Cardiovascular: No chest pain peripheral edema, palpitations. Gastrointestinal: No abdominal distention, no abdominal pain, no change in bowel habits hematochezia or melena. Genitourinary: No dysuria, no frequency, no urgency, no nocturia. Musculoskeletal:No arthralgias, no back pain, no gait disturbance or myalgias. Neurological: No dizziness, no headaches, no numbness, no seizures, no syncope, no weakness, no tremors. Hematologic: No lymphadenopathy, no easy bruising. Psychiatric: No confusion, no hallucinations, no sleep disturbance.    Physical Exam: Filed Vitals:   09/21/11 0932  BP: 138/66  Pulse: 70  The general appearance reveals a well-developed well-nourished gentleman in no distress.Pupils equal and reactive.   Extraocular  Movements are full.  There is no scleral icterus.  The mouth and pharynx are normal.  The neck is supple.  The carotids reveal no bruits.  The jugular venous pressure is normal.  The thyroid is not enlarged.  There is no lymphadenopathy.  The chest is clear to percussion and auscultation. There are no rales or rhonchi. Expansion of the chest is symmetrical.  The precordium is quiet.  The first heart sound is normal.  The second heart sound is physiologically split.  There is no murmur gallop rub or click.  There is no abnormal lift or heave.  The abdomen is soft and nontender. Bowel sounds are normal. The liver and spleen are not enlarged. There Are no abdominal masses. There are no bruits.  The pedal pulses are good.  There is no phlebitis.There is mild edema  There is no cyanosis or clubbing. Strength is normal and symmetrical in all extremities.  There is no lateralizing weakness.  There are no sensory deficits.  The skin is warm and dry.  There is no rash.      Assessment / Plan:  Continue same medication except try cutting back on doxazosin to a half tablet at bedtime and see if the dizziness improves.  Also cut back on dietary salt since he does have a little more edema today.

## 2011-09-22 ENCOUNTER — Telehealth: Payer: Self-pay | Admitting: *Deleted

## 2011-09-22 NOTE — Telephone Encounter (Signed)
Message copied by Eugenia Pancoast on Wed Sep 22, 2011  1:45 PM ------      Message from: [name]      Created: Tue Sep 21, 2011  5:17 PM       Please report.  The blood tests are satisfactory.  Continue same medication.

## 2011-09-22 NOTE — Telephone Encounter (Signed)
Advised of labs 

## 2011-10-06 ENCOUNTER — Other Ambulatory Visit: Payer: Self-pay | Admitting: *Deleted

## 2011-10-06 NOTE — Telephone Encounter (Signed)
Opened in Error.

## 2011-10-14 ENCOUNTER — Other Ambulatory Visit: Payer: Self-pay | Admitting: *Deleted

## 2011-10-14 DIAGNOSIS — E876 Hypokalemia: Secondary | ICD-10-CM

## 2011-10-14 LAB — COMPREHENSIVE METABOLIC PANEL
ALT: 18
ALT: 20
ALT: 23
AST: 22
AST: 64 — ABNORMAL HIGH
Alkaline Phosphatase: 46
Alkaline Phosphatase: 49
Alkaline Phosphatase: 69
CO2: 27
CO2: 27
CO2: 29
Calcium: 8.2 — ABNORMAL LOW
Calcium: 8.3 — ABNORMAL LOW
Chloride: 107
Chloride: 109
GFR calc Af Amer: 53 — ABNORMAL LOW
GFR calc Af Amer: >60
GFR calc non Af Amer: 44 — ABNORMAL LOW
GFR calc non Af Amer: >60
Glucose, Bld: 117 — ABNORMAL HIGH
Glucose, Bld: 144 — ABNORMAL HIGH
Glucose, Bld: 152 — ABNORMAL HIGH
Potassium: 4.1
Potassium: 5.6 — ABNORMAL HIGH
Sodium: 138
Sodium: 140
Sodium: 142
Total Bilirubin: 0.8
Total Protein: 6.1

## 2011-10-14 LAB — BASIC METABOLIC PANEL
CO2: 31
Calcium: 8.8
Chloride: 104
Chloride: 104
Creatinine, Ser: 0.76
GFR calc Af Amer: >60
GFR calc Af Amer: >60
GFR calc non Af Amer: >60
Potassium: 2.8 — ABNORMAL LOW
Sodium: 140

## 2011-10-14 LAB — CBC
HCT: 28 — ABNORMAL LOW
Hemoglobin: 10 — ABNORMAL LOW
Hemoglobin: 12.7 — ABNORMAL LOW
Hemoglobin: 8.7 — ABNORMAL LOW
MCHC: 33
MCHC: 33.4
MCV: 92
Platelets: 110 — ABNORMAL LOW
RBC: 2.83 — ABNORMAL LOW
RBC: 2.86 — ABNORMAL LOW
RBC: 3.28 — ABNORMAL LOW
RBC: 4.2 — ABNORMAL LOW
WBC: 10.7 — ABNORMAL HIGH
WBC: 5.9
WBC: 6.8
WBC: 8

## 2011-10-14 LAB — CROSSMATCH
Antibody Screen: POSITIVE
DAT, IgG: NEGATIVE
Donor AG Type: NEGATIVE
Donor AG Type: NEGATIVE
PT AG Type: NEGATIVE

## 2011-10-14 LAB — DIFFERENTIAL
Basophils Relative: 0
Eosinophils Absolute: 0.1
Eosinophils Relative: 2
Lymphs Abs: 0.5 — ABNORMAL LOW
Monocytes Relative: 6
Neutrophils Relative %: 82 — ABNORMAL HIGH

## 2011-10-14 MED ORDER — POTASSIUM CHLORIDE CRYS ER 20 MEQ PO TBCR: 20.0000 meq | EXTENDED_RELEASE_TABLET | Freq: Every day | ORAL | Status: DC

## 2011-10-14 NOTE — Telephone Encounter (Signed)
Refilled meds per fax request.  

## 2011-10-15 ENCOUNTER — Other Ambulatory Visit: Payer: Self-pay | Admitting: Hematology and Oncology

## 2011-10-15 ENCOUNTER — Encounter (HOSPITAL_BASED_OUTPATIENT_CLINIC_OR_DEPARTMENT_OTHER): Payer: Medicare Other | Admitting: Hematology and Oncology

## 2011-10-15 DIAGNOSIS — C20 Malignant neoplasm of rectum: Secondary | ICD-10-CM

## 2011-10-15 LAB — CBC WITH DIFFERENTIAL/PLATELET
Basophils Absolute: 0 10*3/uL (ref 0.0–0.1)
EOS%: 2.4 % (ref 0.0–7.0)
Eosinophils Absolute: 0.2 10*3/uL (ref 0.0–0.5)
HCT: 39.2 % (ref 38.4–49.9)
HGB: 13.2 g/dL (ref 13.0–17.1)
MCH: 30.9 pg (ref 27.2–33.4)
MCV: 91.8 fL (ref 79.3–98.0)
MONO%: 8.5 % (ref 0.0–14.0)
NEUT#: 4.9 10*3/uL (ref 1.5–6.5)
NEUT%: 74.4 % (ref 39.0–75.0)
RDW: 13.5 % (ref 11.0–14.6)
lymph#: 0.9 10*3/uL (ref 0.9–3.3)

## 2011-10-15 LAB — COMPREHENSIVE METABOLIC PANEL
AST: 20 U/L (ref 0–37)
Albumin: 4.3 g/dL (ref 3.5–5.2)
BUN: 13 mg/dL (ref 6–23)
Calcium: 9.7 mg/dL (ref 8.4–10.5)
Chloride: 103 mEq/L (ref 96–112)
Creatinine, Ser: 1.02 mg/dL (ref 0.50–1.35)
Glucose, Bld: 93 mg/dL (ref 70–99)
Potassium: 3.9 mEq/L (ref 3.5–5.3)

## 2011-10-18 ENCOUNTER — Ambulatory Visit (HOSPITAL_COMMUNITY)
Admission: RE | Admit: 2011-10-18 | Discharge: 2011-10-18 | Disposition: A | Discharge status: Home / Self Care | Payer: Medicare Other | Source: Ambulatory Visit | Attending: Hematology and Oncology | Admitting: Hematology and Oncology

## 2011-10-18 DIAGNOSIS — N62 Hypertrophy of breast: Secondary | ICD-10-CM | POA: Insufficient documentation

## 2011-10-18 DIAGNOSIS — C20 Malignant neoplasm of rectum: Secondary | ICD-10-CM

## 2011-10-18 DIAGNOSIS — Z96619 Presence of unspecified artificial shoulder joint: Secondary | ICD-10-CM | POA: Insufficient documentation

## 2011-10-18 DIAGNOSIS — Q619 Cystic kidney disease, unspecified: Secondary | ICD-10-CM | POA: Insufficient documentation

## 2011-10-18 DIAGNOSIS — K439 Ventral hernia without obstruction or gangrene: Secondary | ICD-10-CM | POA: Insufficient documentation

## 2011-10-18 DIAGNOSIS — I700 Atherosclerosis of aorta: Secondary | ICD-10-CM | POA: Insufficient documentation

## 2011-10-18 DIAGNOSIS — M533 Sacrococcygeal disorders, not elsewhere classified: Secondary | ICD-10-CM | POA: Insufficient documentation

## 2011-10-18 DIAGNOSIS — Z951 Presence of aortocoronary bypass graft: Secondary | ICD-10-CM | POA: Insufficient documentation

## 2011-10-18 DIAGNOSIS — M47817 Spondylosis without myelopathy or radiculopathy, lumbosacral region: Secondary | ICD-10-CM | POA: Insufficient documentation

## 2011-10-18 DIAGNOSIS — I708 Atherosclerosis of other arteries: Secondary | ICD-10-CM | POA: Insufficient documentation

## 2011-10-18 MED ORDER — IOHEXOL 300 MG/ML  SOLN
100.0000 mL | Freq: Once | INTRAMUSCULAR | Status: AC | PRN
  Administered 2011-10-18: 100 mL via INTRAVENOUS

## 2011-10-20 ENCOUNTER — Other Ambulatory Visit: Payer: Self-pay | Admitting: Hematology and Oncology

## 2011-10-20 ENCOUNTER — Encounter (HOSPITAL_BASED_OUTPATIENT_CLINIC_OR_DEPARTMENT_OTHER): Payer: Medicare Other | Admitting: Hematology and Oncology

## 2011-10-20 DIAGNOSIS — C19 Malignant neoplasm of rectosigmoid junction: Secondary | ICD-10-CM

## 2011-10-20 DIAGNOSIS — Z9049 Acquired absence of other specified parts of digestive tract: Secondary | ICD-10-CM

## 2011-10-20 DIAGNOSIS — D638 Anemia in other chronic diseases classified elsewhere: Secondary | ICD-10-CM

## 2011-10-20 DIAGNOSIS — C20 Malignant neoplasm of rectum: Secondary | ICD-10-CM

## 2011-10-20 DIAGNOSIS — K5730 Diverticulosis of large intestine without perforation or abscess without bleeding: Secondary | ICD-10-CM

## 2011-10-22 ENCOUNTER — Other Ambulatory Visit (HOSPITAL_COMMUNITY): Payer: Medicare Other

## 2011-12-06 ENCOUNTER — Other Ambulatory Visit: Payer: Self-pay | Admitting: *Deleted

## 2011-12-06 MED ORDER — AMLODIPINE BESY-BENAZEPRIL HCL 5-20 MG PO CAPS: 1.0000 | ORAL_CAPSULE | Freq: Every day | ORAL | Status: DC

## 2011-12-06 NOTE — Telephone Encounter (Signed)
Refilled amlodipine 

## 2011-12-31 ENCOUNTER — Other Ambulatory Visit (INDEPENDENT_AMBULATORY_CARE_PROVIDER_SITE_OTHER): Payer: Medicare Other | Admitting: *Deleted

## 2011-12-31 ENCOUNTER — Encounter: Payer: Self-pay | Admitting: Cardiology

## 2011-12-31 ENCOUNTER — Ambulatory Visit (INDEPENDENT_AMBULATORY_CARE_PROVIDER_SITE_OTHER): Payer: Medicare Other | Admitting: Cardiology

## 2011-12-31 DIAGNOSIS — I10 Essential (primary) hypertension: Secondary | ICD-10-CM

## 2011-12-31 DIAGNOSIS — Z951 Presence of aortocoronary bypass graft: Secondary | ICD-10-CM

## 2011-12-31 DIAGNOSIS — I119 Hypertensive heart disease without heart failure: Secondary | ICD-10-CM

## 2011-12-31 DIAGNOSIS — E785 Hyperlipidemia, unspecified: Secondary | ICD-10-CM

## 2011-12-31 DIAGNOSIS — Z9889 Other specified postprocedural states: Secondary | ICD-10-CM

## 2011-12-31 DIAGNOSIS — E7800 Pure hypercholesterolemia, unspecified: Secondary | ICD-10-CM

## 2011-12-31 DIAGNOSIS — R079 Chest pain, unspecified: Secondary | ICD-10-CM

## 2011-12-31 LAB — CBC WITH DIFFERENTIAL/PLATELET
Basophils Relative: 0.5 % (ref 0.0–3.0)
Eosinophils Absolute: 0.2 10*3/uL (ref 0.0–0.7)
Eosinophils Relative: 3.5 % (ref 0.0–5.0)
Hemoglobin: 12.7 g/dL — ABNORMAL LOW (ref 13.0–17.0)
Lymphocytes Relative: 12.8 % (ref 12.0–46.0)
Monocytes Relative: 8.5 % (ref 3.0–12.0)
Neutro Abs: 5.3 10*3/uL (ref 1.4–7.7)
Neutrophils Relative %: 74.7 % (ref 43.0–77.0)
RBC: 4.11 Mil/uL — ABNORMAL LOW (ref 4.22–5.81)
WBC: 7.1 10*3/uL (ref 4.5–10.5)

## 2011-12-31 LAB — LIPID PANEL
HDL: 48.1 mg/dL (ref >39.00)
LDL Cholesterol: 58 mg/dL (ref 0–99)
Total CHOL/HDL Ratio: 2
Triglycerides: 57 mg/dL (ref 0.0–149.0)
VLDL: 11.4 mg/dL (ref 0.0–40.0)

## 2011-12-31 LAB — BASIC METABOLIC PANEL
CO2: 31 mEq/L (ref 19–32)
Calcium: 9.2 mg/dL (ref 8.4–10.5)
Creatinine, Ser: 1.2 mg/dL (ref 0.4–1.5)
GFR: 60.87 mL/min (ref >60.00)
Sodium: 142 mEq/L (ref 135–145)

## 2011-12-31 LAB — HEPATIC FUNCTION PANEL
Alkaline Phosphatase: 63 U/L (ref 39–117)
Bilirubin, Direct: 0.1 mg/dL (ref 0.0–0.3)
Total Protein: 6.5 g/dL (ref 6.0–8.3)

## 2011-12-31 MED ORDER — NITROGLYCERIN 0.4 MG SL SUBL: 0.4000 mg | SUBLINGUAL_TABLET | SUBLINGUAL | Status: DC | PRN

## 2011-12-31 MED ORDER — METOPROLOL SUCCINATE ER 50 MG PO TB24: 50.0000 mg | ORAL_TABLET | Freq: Every day | ORAL | Status: DC

## 2011-12-31 MED ORDER — EZETIMIBE-SIMVASTATIN 10-20 MG PO TABS: 1.0000 | ORAL_TABLET | Freq: Every day | ORAL | Status: DC

## 2011-12-31 NOTE — Patient Instructions (Signed)
Your physician wants you to follow-up in: 4 months You will receive a reminder letter in the mail two months in advance. If you don't receive a letter, please call our office to schedule the follow-up appointment.     .Your physician recommends that you continue on your current medications as directed. Please refer to the Current Medication list given to you today.  

## 2011-12-31 NOTE — Assessment & Plan Note (Signed)
Patient has not been experiencing any chest pain.  He's not had to take any sublingual nitroglycerin.

## 2011-12-31 NOTE — Assessment & Plan Note (Signed)
The patient has a history of dyslipidemia and is on Vytorin.  We refilled his Vytorin today.  Is not having any myalgias from the Vytorin

## 2011-12-31 NOTE — Progress Notes (Signed)
Nicholas [name] Date of Birth:  1934-06-27 [facility] [address] [place], [place]  [address] [phone]         Fax   [phone]  History of Present Illness: This pleasant 78 year old gentleman is seen for a four-month followup office visit.  He has a history of ischemic heart disease.  He had coronary artery bypass graft surgery on 05/27/92.  As a history of compensated congestive heart failure.  He had a arthroscopy on his right knee in June 2010 and had nuclear stress test for preoperative clearance which showed an old inferior scar but no reversible ischemia and his ejection fraction was 51%.  The patient has a history of colon cancer and has a functioning colostomy.  He has a history of BPH.  History of essential hypertension.  Since last visit he's been doing well except for worsening problems with his right knee and he expects that he will have to have a right total knee replacement soon.  He has an appointment to see his orthopedist next week.  Current Outpatient Prescriptions  Medication Sig Dispense Refill  . amLODipine-benazepril (LOTREL) 5-20 MG per capsule Take 1 capsule by mouth daily.  30 capsule  11  . aspirin 81 MG tablet Take 81 mg by mouth daily.        . Calcium Carbonate-Vitamin D (CALCIUM 600-D) 600-400 MG-UNIT per tablet Take 1 tablet by mouth daily.        Marland Kitchen doxazosin (CARDURA) 8 MG tablet Take 8 mg by mouth at bedtime.        Marland Kitchen ezetimibe-simvastatin (VYTORIN) 10-20 MG per tablet Take 1 tablet by mouth at bedtime.  30 tablet  11  . finasteride (PROSCAR) 5 MG tablet Take 5 mg by mouth daily.        . furosemide (LASIX) 40 MG tablet Take 40 mg by mouth daily.        . metoprolol (TOPROL-XL) 50 MG 24 hr tablet Take 1 tablet (50 mg total) by mouth daily.  30 tablet  11  . Misc Natural Products (SAW PALMETTO PLUS) CAPS Take 1 capsule by mouth daily.        . pantoprazole (PROTONIX) 40 MG tablet Take one by mouth once a day  30 tablet  12  .  potassium chloride SA (K-DUR,KLOR-CON) 20 MEQ tablet Take 1 tablet (20 mEq total) by mouth daily.  90 tablet  3  . vitamin C (ASCORBIC ACID) 500 MG tablet Take 500 mg by mouth daily.        . Vitamins-Lipotropics (B-100 COMPLEX) TABS Take 1 tablet by mouth daily.        . nitroGLYCERIN (NITROSTAT) 0.4 MG SL tablet Place 1 tablet (0.4 mg total) under the tongue every 5 (five) minutes as needed for chest pain.  25 tablet  11    Allergies  Allergen Reactions  . Ciprofloxacin   . Hctz (Hydrochlorothiazide)     DIZZINESS   . Lipitor (Atorvastatin Calcium)     MUSCLE PAIN  . Niaspan (Niacin (Antihyperlipidemic))     FLUSHING   . Oxycodone-Acetaminophen   . Sulfonamide Derivatives     Patient Active Problem List  Diagnoses  . CARCINOMA, RECTUM  . HYPERCHOLESTEROLEMIA  . ANEMIA, IRON DEFICIENCY  . HYPERTENSION  . MYOCARDIAL INFARCTION  . GERD  . DIVERTICULOSIS, COLON  . DEGENERATIVE JOINT DISEASE, GENERALIZED  . COLONIC POLYPS, ADENOMATOUS, HX OF  . Personal history of malignant neoplasm of rectum, rectosigmoid junction, and anus  .  GERD (gastroesophageal reflux disease)  . Hernia of abdominal cavity  . Hx of CABG  . Asymptomatic PVCs  . BPH (benign prostatic hyperplasia)    History  Smoking status  . Former Smoker  . Quit date: 05/26/1991  Smokeless tobacco  . Current User    History  Alcohol Use No    Family History  Problem Relation Age of Onset  . Pancreatic cancer Father   . Cancer Father   . Diabetes Son   . Emphysema Mother     Review of Systems: Constitutional: no fever chills diaphoresis or fatigue or change in weight.  Head and neck: no hearing loss, no epistaxis, no photophobia or visual disturbance. Respiratory: No cough, shortness of breath or wheezing. Cardiovascular: No chest pain peripheral edema, palpitations. Gastrointestinal: No abdominal distention, no abdominal pain, no change in bowel habits hematochezia or melena. Genitourinary: No  dysuria, no frequency, no urgency, no nocturia. Musculoskeletal:No arthralgias, no back pain, no gait disturbance or myalgias. Neurological: No dizziness, no headaches, no numbness, no seizures, no syncope, no weakness, no tremors. Hematologic: No lymphadenopathy, no easy bruising. Psychiatric: No confusion, no hallucinations, no sleep disturbance.    Physical Exam: Filed Vitals:   12/31/11 1418  BP: 120/70  Pulse: 66   the general appearance reveals a well-developed well-nourished gentleman in no distress.Pupils equal and reactive.   Extraocular Movements are full.  There is no scleral icterus.  The mouth and pharynx are normal.  The neck is supple.  The carotids reveal no bruits.  The jugular venous pressure is normal.  The thyroid is not enlarged.  There is no lymphadenopathy.  The chest is clear to percussion and auscultation. There are no rales or rhonchi. Expansion of the chest is symmetrical.  The precordium is quiet.  The first heart sound is normal.  The second heart sound is physiologically split.  There is no murmur gallop rub or click.  There is no abnormal lift or heave.  Occasional premature beats are audible. The abdomen reveals colostomy in the left lower quadrant.  Extremities reveal 2+ soft pitting edema.The skin is warm and dry.  There is no rash. Strength is normal and symmetrical in all extremities.  There is no lateralizing weakness.  There are no sensory deficits.       Assessment / Plan: Continue same medication.  Recheck in 4 months for followup office visit lipid panel hepatic function panel and basal metabolic panel

## 2011-12-31 NOTE — Assessment & Plan Note (Signed)
His blood pressure has been remaining stable on current therapy.  He does accumulate significant dependent edema during the day which goes down at night.  Is not having any symptoms of orthopnea or paroxysmal nocturnal dyspnea.  Weight is up 1 pound since last visit

## 2012-01-04 ENCOUNTER — Telehealth: Payer: Self-pay | Admitting: Cardiology

## 2012-01-04 NOTE — Telephone Encounter (Signed)
Labs faxed to Amy/[facility] @ [phone] 01/04/12/KM

## 2012-01-04 NOTE — Telephone Encounter (Signed)
Walk In Pt from " Pt Dropped off form from [facility]" to be completed Sent to Message Nurse  01/04/12/KM

## 2012-01-07 ENCOUNTER — Telehealth: Payer: Self-pay | Admitting: Cardiology

## 2012-01-07 DIAGNOSIS — I2510 Atherosclerotic heart disease of native coronary artery without angina pectoris: Secondary | ICD-10-CM

## 2012-01-07 NOTE — Telephone Encounter (Signed)
Patient has been scheduled

## 2012-01-07 NOTE — Telephone Encounter (Signed)
New Problem:    Patient called in claiming to be returning your phone call regarding the blood work he had taken on his last visit. He would also like to know the status of his knee surgery clearance papers he turned in the other day.  Please call back.

## 2012-01-07 NOTE — Telephone Encounter (Signed)
Copy of labs given at office visit.   Dr. Patty Sermons does want him to have a lexiscan prior to surgery.  Advised patient and will forward to [facility] for scheduling

## 2012-01-13 ENCOUNTER — Ambulatory Visit (HOSPITAL_COMMUNITY): Payer: Medicare Other | Attending: Cardiology | Admitting: Radiology

## 2012-01-13 VITALS — BP 157/75 | Ht 70.0 in | Wt 221.0 lb

## 2012-01-13 DIAGNOSIS — Z9861 Coronary angioplasty status: Secondary | ICD-10-CM | POA: Insufficient documentation

## 2012-01-13 DIAGNOSIS — I219 Acute myocardial infarction, unspecified: Secondary | ICD-10-CM

## 2012-01-13 DIAGNOSIS — Z01810 Encounter for preprocedural cardiovascular examination: Secondary | ICD-10-CM | POA: Insufficient documentation

## 2012-01-13 DIAGNOSIS — R0989 Other specified symptoms and signs involving the circulatory and respiratory systems: Secondary | ICD-10-CM | POA: Insufficient documentation

## 2012-01-13 DIAGNOSIS — I10 Essential (primary) hypertension: Secondary | ICD-10-CM | POA: Insufficient documentation

## 2012-01-13 DIAGNOSIS — Z951 Presence of aortocoronary bypass graft: Secondary | ICD-10-CM | POA: Insufficient documentation

## 2012-01-13 DIAGNOSIS — R000 Tachycardia, unspecified: Secondary | ICD-10-CM | POA: Insufficient documentation

## 2012-01-13 DIAGNOSIS — E7800 Pure hypercholesterolemia, unspecified: Secondary | ICD-10-CM

## 2012-01-13 DIAGNOSIS — R002 Palpitations: Secondary | ICD-10-CM | POA: Insufficient documentation

## 2012-01-13 DIAGNOSIS — R0609 Other forms of dyspnea: Secondary | ICD-10-CM | POA: Insufficient documentation

## 2012-01-13 DIAGNOSIS — R0602 Shortness of breath: Secondary | ICD-10-CM

## 2012-01-13 DIAGNOSIS — E669 Obesity, unspecified: Secondary | ICD-10-CM | POA: Insufficient documentation

## 2012-01-13 DIAGNOSIS — I2510 Atherosclerotic heart disease of native coronary artery without angina pectoris: Secondary | ICD-10-CM

## 2012-01-13 DIAGNOSIS — E785 Hyperlipidemia, unspecified: Secondary | ICD-10-CM | POA: Insufficient documentation

## 2012-01-13 DIAGNOSIS — Z87891 Personal history of nicotine dependence: Secondary | ICD-10-CM | POA: Insufficient documentation

## 2012-01-13 MED ORDER — TECHNETIUM TC 99M TETROFOSMIN IV KIT
33.0000 | PACK | Freq: Once | INTRAVENOUS | Status: AC | PRN
  Administered 2012-01-13: 33 via INTRAVENOUS

## 2012-01-13 MED ORDER — REGADENOSON 0.4 MG/5ML IV SOLN
0.4000 mg | Freq: Once | INTRAVENOUS | Status: AC
  Administered 2012-01-13: 0.4 mg via INTRAVENOUS

## 2012-01-13 MED ORDER — TECHNETIUM TC 99M TETROFOSMIN IV KIT
10.3000 | PACK | Freq: Once | INTRAVENOUS | Status: AC | PRN
  Administered 2012-01-13: 10 via INTRAVENOUS

## 2012-01-13 NOTE — Progress Notes (Signed)
[facility] SITE 3 NUCLEAR MED [address] [place] [address] [phone]  Cardiology Nuclear Med Study  Nicholas [name] is a 78 y.o. male [id number] 08-25-1934   Nuclear Med Background Indication for Stress Test:  Evaluation for Ischemia, Graft Patency and Pending Surgical Clearance for (R) Total Knee Replacement by Dr. Georgena Spurling History:  History of Chemo; 5/93 PTCA-RCA>6/93 CABG; '10 Echo:EF=50-55%, mild MR; Myocardial Perfusion Study:Small old inferior wall scar, no ischemia, EF=59%. Cardiac Risk Factors: History of Smoking, Hypertension, Lipids and Obesity  Symptoms:  DOE, Palpitations and Rapid HR   Nuclear Pre-Procedure Caffeine/Decaff Intake:  None NPO After: 8:00pm   Lungs:  Clear.  O2 SAT 98% on RA. IV 0.9% NS with Angio Cath:  22g  IV Site: R Antecubital x 1, tolerated well IV Started by:  Irean Hong, RN  Chest Size (in):  46 Cup Size: n/a  Height: 5\' 10"  (1.778 m)  Weight:  221 lb (100.245 kg)  BMI:  Body mass index is 31.71 kg/(m^2). Tech Comments:  Toprol held x 24 hours    Nuclear Med Study 1 or 2 day study: 1 day  Stress Test Type:  Lexiscan  Reading MD: Olga Millers, MD  Order Authorizing Provider:  [name], MD  Resting Radionuclide: Technetium 46m Tetrofosmin  Resting Radionuclide Dose: 10.3 mCi   Stress Radionuclide:  Technetium 50m Tetrofosmin  Stress Radionuclide Dose: 33.0 mCi           Stress Protocol Rest HR: 62 Stress HR: 88  Rest BP: 157/75 Stress BP: 144/57  Exercise Time (min): n/a METS: n/a   Predicted Max HR: 145 bpm % Max HR: 60.69 bpm Rate Pressure Product: 44010   Dose of Adenosine (mg):  n/a Dose of Lexiscan: 0.4 mg  Dose of Atropine (mg): n/a Dose of Dobutamine: n/a mcg/kg/min (at max HR)  Stress Test Technologist: Smiley Houseman, CMA-N  Nuclear Technologist:  Domenic Polite, CNMT     Rest Procedure:  Myocardial perfusion imaging was performed at rest 45 minutes following the intravenous  administration of Technetium 37m Tetrofosmin.  Rest ECG: New T-wave changes with frequent PVC's and couplets.  Stress Procedure:  The patient received IV Lexiscan 0.4 mg over 15-seconds.  Technetium 23m Tetrofosmin injected at 30-seconds.  There were no significant changes with Lexiscan, frequent PVC's.  Quantitative spect images were obtained after a 45 minute delay.  Stress ECG: No significant ST segment change suggestive of ischemia.  QPS Raw Data Images:  Acquisition technically good; normal left ventricular size. Stress Images:  There is decreased uptake in the basal inferolateral wall. Rest Images:  There is decreased uptake in the basal inferolateral wall. Subtraction (SDS):  No evidence of ischemia. Transient Ischemic Dilatation (Normal <1.22):  1.07 Lung/Heart Ratio (Normal <0.45):  0.43  Quantitative Gated Spect Images QGS EDV:  n/a QGS ESV:  n/a QGS cine images:  Study not gated QGS EF: Study not gated  Impression Exercise Capacity:  Lexiscan with no exercise. BP Response:  Normal blood pressure response. Clinical Symptoms:  No chest pain. ECG Impression:  No significant ST segment change suggestive of ischemia; frequent PVCs. Comparison with Prior Nuclear Study: No images to compare  Overall Impression:  Abnormal stress nuclear study with a small fixed basal inferolateral defect suggestive of small prior infarct; no ischemia.  Olga Millers

## 2012-01-19 ENCOUNTER — Telehealth: Payer: Self-pay | Admitting: Cardiology

## 2012-01-19 ENCOUNTER — Telehealth: Payer: Self-pay | Admitting: *Deleted

## 2012-01-19 NOTE — Telephone Encounter (Signed)
New problem Pt wants to know status of surgical clearance for knee surgery Please call

## 2012-01-19 NOTE — Telephone Encounter (Signed)
Advised and will send to orthopedist

## 2012-01-19 NOTE — Telephone Encounter (Signed)
Advised of stress test results and faxed to orthopedist

## 2012-01-19 NOTE — Telephone Encounter (Signed)
New problem Pt wants to know status of surgical clearance paperwork please call

## 2012-01-19 NOTE — Telephone Encounter (Signed)
Message copied by Burnell Blanks on Wed Jan 19, 2012  2:03 PM ------      Message from: [name]      Created: Mon Jan 17, 2012  8:42 PM       Please report.  Stress test is satisfactory.  No ischemia.  Okay for knee surgery. Send copy of this note and the nuclear study to his orthopedist.

## 2012-02-11 ENCOUNTER — Other Ambulatory Visit: Payer: Self-pay | Admitting: *Deleted

## 2012-02-11 MED ORDER — FUROSEMIDE 40 MG PO TABS: 40.0000 mg | ORAL_TABLET | Freq: Every day | ORAL | Status: DC

## 2012-02-11 NOTE — Telephone Encounter (Signed)
Refilled furosemide

## 2012-02-14 ENCOUNTER — Encounter (HOSPITAL_COMMUNITY): Payer: Self-pay | Admitting: Pharmacy Technician

## 2012-02-17 ENCOUNTER — Other Ambulatory Visit: Payer: Self-pay | Admitting: Orthopedic Surgery

## 2012-02-17 ENCOUNTER — Encounter (HOSPITAL_COMMUNITY): Payer: Self-pay

## 2012-02-17 ENCOUNTER — Encounter (HOSPITAL_COMMUNITY)
Admission: RE | Admit: 2012-02-17 | Discharge: 2012-02-17 | Disposition: A | Discharge status: Home / Self Care | Payer: Medicare Other | Source: Ambulatory Visit | Attending: Orthopedic Surgery | Admitting: Orthopedic Surgery

## 2012-02-17 HISTORY — DX: Atherosclerotic heart disease of native coronary artery without angina pectoris: I25.10

## 2012-02-17 HISTORY — DX: Insomnia, unspecified: G47.00

## 2012-02-17 HISTORY — DX: Personal history of other venous thrombosis and embolism: Z86.718

## 2012-02-17 HISTORY — DX: Unspecified osteoarthritis, unspecified site: M19.90

## 2012-02-17 HISTORY — DX: Gastro-esophageal reflux disease without esophagitis: K21.9

## 2012-02-17 HISTORY — DX: Benign prostatic hyperplasia without lower urinary tract symptoms: N40.0

## 2012-02-17 HISTORY — DX: Unspecified malignant neoplasm of skin, unspecified: C44.90

## 2012-02-17 LAB — APTT: aPTT: 27 seconds (ref 24–37)

## 2012-02-17 LAB — COMPREHENSIVE METABOLIC PANEL
ALT: 18 U/L (ref 0–53)
AST: 22 U/L (ref 0–37)
Albumin: 4 g/dL (ref 3.5–5.2)
Alkaline Phosphatase: 71 U/L (ref 39–117)
BUN: 14 mg/dL (ref 6–23)
Chloride: 102 mEq/L (ref 96–112)
Potassium: 4.4 mEq/L (ref 3.5–5.1)
Sodium: 141 mEq/L (ref 135–145)
Total Bilirubin: 0.7 mg/dL (ref 0.3–1.2)
Total Protein: 6.9 g/dL (ref 6.0–8.3)

## 2012-02-17 LAB — DIFFERENTIAL
Basophils Relative: 1 % (ref 0–1)
Eosinophils Absolute: 0.1 10*3/uL (ref 0.0–0.7)
Eosinophils Relative: 2 % (ref 0–5)
Lymphs Abs: 1 10*3/uL (ref 0.7–4.0)
Monocytes Absolute: 0.8 10*3/uL (ref 0.1–1.0)
Monocytes Relative: 12 % (ref 3–12)

## 2012-02-17 LAB — URINALYSIS, ROUTINE W REFLEX MICROSCOPIC
Glucose, UA: NEGATIVE mg/dL
Hgb urine dipstick: NEGATIVE
Ketones, ur: NEGATIVE mg/dL
Leukocytes, UA: NEGATIVE
Protein, ur: NEGATIVE mg/dL
Urobilinogen, UA: 1 mg/dL (ref 0.0–1.0)

## 2012-02-17 LAB — CBC
HCT: 41.2 % (ref 39.0–52.0)
Platelets: 145 10*3/uL — ABNORMAL LOW (ref 150–400)
RDW: 13.2 % (ref 11.5–15.5)
WBC: 7.2 10*3/uL (ref 4.0–10.5)

## 2012-02-17 LAB — SURGICAL PCR SCREEN: MRSA, PCR: NEGATIVE

## 2012-02-17 MED ORDER — CHLORHEXIDINE GLUCONATE 4 % EX LIQD: 60.0000 mL | Freq: Once | CUTANEOUS | Status: DC

## 2012-02-17 NOTE — Progress Notes (Addendum)
Dr.Brackbill is cardiologist and last office visit is in epic  Stress test in epic from Jan 2013  Denies heart catherization  Medical MD Dr.Wilson Shelah Lewandowsky from [facility]  EKG requested from Dr.Elkin  Unsure about echo will call Dr.Brackbill to verify

## 2012-02-17 NOTE — Pre-Procedure Instructions (Signed)
Nicholas [name]  02/17/2012   Your procedure is scheduled on:  Mon, Mar 4 @ 1000  Report to [facility] at 0800 AM.  Call this number if you have problems the morning of surgery: [phone]   Remember:   Do not eat food:After Midnight.  May have clear liquids: up to 4 Hours before arrival.(until 4:00 am)  Clear liquids include soda, tea, black coffee, apple or grape juice, broth.  Take these medicines the morning of surgery with A SIP OF WATER: Amlodipine,Metoprolol,and Protonix   Do not wear jewelry, make-up or nail polish.  Do not wear lotions, powders, or perfumes. You may wear deodorant.  Do not shave 48 hours prior to surgery.  Do not bring valuables to the hospital.  Contacts, dentures or bridgework may not be worn into surgery.  Leave suitcase in the car. After surgery it may be brought to your room.  For patients admitted to the hospital, checkout time is 11:00 AM the day of discharge.   Patients discharged the day of surgery will not be allowed to drive home.  Name and phone number of your driver:   Special Instructions: CHG Shower Use Special Wash: 1/2 bottle night before surgery and 1/2 bottle morning of surgery.   Please read over the following fact sheets that you were given: Pain Booklet, Coughing and Deep Breathing, Blood Transfusion Information, Total Joint Packet, MRSA Information and Surgical Site Infection Prevention

## 2012-02-27 MED ORDER — CEFAZOLIN SODIUM-DEXTROSE 2-3 GM-% IV SOLR
2.0000 g | INTRAVENOUS | Status: AC
  Administered 2012-02-28: 2 g via INTRAVENOUS
  Filled 2012-02-27: qty 50

## 2012-02-28 ENCOUNTER — Encounter (HOSPITAL_COMMUNITY)
Admission: RE | Disposition: A | Discharge status: Home Health | Payer: Self-pay | Source: Ambulatory Visit | Attending: Orthopedic Surgery

## 2012-02-28 ENCOUNTER — Inpatient Hospital Stay (HOSPITAL_COMMUNITY)
Admission: RE | Admit: 2012-02-28 | Discharge: 2012-03-01 | DRG: 470 | Disposition: A | Discharge status: Home Health | Payer: Medicare Other | Source: Ambulatory Visit | Attending: Orthopedic Surgery | Admitting: Orthopedic Surgery

## 2012-02-28 ENCOUNTER — Encounter (HOSPITAL_COMMUNITY): Payer: Self-pay | Admitting: Certified Registered"

## 2012-02-28 ENCOUNTER — Ambulatory Visit (HOSPITAL_COMMUNITY): Payer: Medicare Other | Admitting: Certified Registered"

## 2012-02-28 ENCOUNTER — Encounter (HOSPITAL_COMMUNITY): Payer: Self-pay | Admitting: *Deleted

## 2012-02-28 ENCOUNTER — Ambulatory Visit (HOSPITAL_COMMUNITY): Payer: Medicare Other

## 2012-02-28 DIAGNOSIS — Z01812 Encounter for preprocedural laboratory examination: Secondary | ICD-10-CM

## 2012-02-28 DIAGNOSIS — I2510 Atherosclerotic heart disease of native coronary artery without angina pectoris: Secondary | ICD-10-CM | POA: Diagnosis present

## 2012-02-28 DIAGNOSIS — I10 Essential (primary) hypertension: Secondary | ICD-10-CM | POA: Diagnosis present

## 2012-02-28 DIAGNOSIS — Z860100 Personal history of colon polyps, unspecified: Secondary | ICD-10-CM

## 2012-02-28 DIAGNOSIS — Z951 Presence of aortocoronary bypass graft: Secondary | ICD-10-CM

## 2012-02-28 DIAGNOSIS — Z85048 Personal history of other malignant neoplasm of rectum, rectosigmoid junction, and anus: Secondary | ICD-10-CM

## 2012-02-28 DIAGNOSIS — D62 Acute posthemorrhagic anemia: Secondary | ICD-10-CM | POA: Diagnosis not present

## 2012-02-28 DIAGNOSIS — Z8719 Personal history of other diseases of the digestive system: Secondary | ICD-10-CM

## 2012-02-28 DIAGNOSIS — Z87891 Personal history of nicotine dependence: Secondary | ICD-10-CM

## 2012-02-28 DIAGNOSIS — I252 Old myocardial infarction: Secondary | ICD-10-CM

## 2012-02-28 DIAGNOSIS — Z883 Allergy status to other anti-infective agents status: Secondary | ICD-10-CM

## 2012-02-28 DIAGNOSIS — N400 Enlarged prostate without lower urinary tract symptoms: Secondary | ICD-10-CM | POA: Diagnosis present

## 2012-02-28 DIAGNOSIS — Z86718 Personal history of other venous thrombosis and embolism: Secondary | ICD-10-CM

## 2012-02-28 DIAGNOSIS — Z888 Allergy status to other drugs, medicaments and biological substances status: Secondary | ICD-10-CM

## 2012-02-28 DIAGNOSIS — M1711 Unilateral primary osteoarthritis, right knee: Secondary | ICD-10-CM

## 2012-02-28 DIAGNOSIS — M1710 Unilateral primary osteoarthritis, unspecified knee: Principal | ICD-10-CM | POA: Diagnosis present

## 2012-02-28 DIAGNOSIS — K219 Gastro-esophageal reflux disease without esophagitis: Secondary | ICD-10-CM | POA: Diagnosis present

## 2012-02-28 DIAGNOSIS — Z882 Allergy status to sulfonamides status: Secondary | ICD-10-CM

## 2012-02-28 DIAGNOSIS — E7800 Pure hypercholesterolemia, unspecified: Secondary | ICD-10-CM | POA: Diagnosis present

## 2012-02-28 DIAGNOSIS — I509 Heart failure, unspecified: Secondary | ICD-10-CM | POA: Diagnosis present

## 2012-02-28 HISTORY — PX: TOTAL KNEE ARTHROPLASTY: SHX125

## 2012-02-28 LAB — TYPE AND SCREEN: Antibody Screen: NEGATIVE

## 2012-02-28 SURGERY — ARTHROPLASTY, KNEE, TOTAL
Anesthesia: Regional | Site: Knee | Laterality: Right | Wound class: Clean

## 2012-02-28 MED ORDER — LACTATED RINGERS IV SOLN
INTRAVENOUS | Status: DC | PRN
  Administered 2012-02-28: 10:00:00 via INTRAVENOUS

## 2012-02-28 MED ORDER — BISACODYL 5 MG PO TBEC: 5.0000 mg | DELAYED_RELEASE_TABLET | Freq: Every day | ORAL | Status: DC | PRN

## 2012-02-28 MED ORDER — METHOCARBAMOL 100 MG/ML IJ SOLN
500.0000 mg | Freq: Four times a day (QID) | INTRAMUSCULAR | Status: DC | PRN
  Filled 2012-02-28: qty 5

## 2012-02-28 MED ORDER — ONDANSETRON HCL 4 MG/2ML IJ SOLN: 4.0000 mg | Freq: Four times a day (QID) | INTRAMUSCULAR | Status: DC | PRN

## 2012-02-28 MED ORDER — ONDANSETRON HCL 4 MG/2ML IJ SOLN
INTRAMUSCULAR | Status: DC | PRN
  Administered 2012-02-28: 4 mg via INTRAVENOUS

## 2012-02-28 MED ORDER — METHOCARBAMOL 500 MG PO TABS
500.0000 mg | ORAL_TABLET | Freq: Four times a day (QID) | ORAL | Status: DC | PRN
  Administered 2012-02-28: 500 mg via ORAL
  Filled 2012-02-28: qty 1

## 2012-02-28 MED ORDER — ZOLPIDEM TARTRATE 5 MG PO TABS: 5.0000 mg | ORAL_TABLET | Freq: Every evening | ORAL | Status: DC | PRN

## 2012-02-28 MED ORDER — MIDAZOLAM HCL 2 MG/2ML IJ SOLN
1.0000 mg | INTRAMUSCULAR | Status: DC | PRN
  Administered 2012-02-28: 1 mg via INTRAVENOUS

## 2012-02-28 MED ORDER — AMLODIPINE BESYLATE 5 MG PO TABS
5.0000 mg | ORAL_TABLET | Freq: Every day | ORAL | Status: DC
  Administered 2012-02-29 – 2012-03-01 (×2): 5 mg via ORAL
  Filled 2012-02-28 (×2): qty 1

## 2012-02-28 MED ORDER — BUPIVACAINE 0.25 % ON-Q PUMP SINGLE CATH 300ML
300.0000 mL | INJECTION | Status: AC
  Administered 2012-02-28: 300 mL
  Filled 2012-02-28: qty 300

## 2012-02-28 MED ORDER — BENAZEPRIL HCL 20 MG PO TABS
20.0000 mg | ORAL_TABLET | Freq: Every day | ORAL | Status: DC
  Administered 2012-02-29 – 2012-03-01 (×2): 20 mg via ORAL
  Filled 2012-02-28 (×2): qty 1

## 2012-02-28 MED ORDER — POTASSIUM CHLORIDE CRYS ER 20 MEQ PO TBCR
20.0000 meq | EXTENDED_RELEASE_TABLET | Freq: Every day | ORAL | Status: DC
  Administered 2012-02-28: 20 meq via ORAL
  Filled 2012-02-28 (×2): qty 1

## 2012-02-28 MED ORDER — ENOXAPARIN SODIUM 40 MG/0.4ML ~~LOC~~ SOLN
40.0000 mg | SUBCUTANEOUS | Status: DC
  Administered 2012-02-29 – 2012-03-01 (×2): 40 mg via SUBCUTANEOUS
  Filled 2012-02-28 (×4): qty 0.4

## 2012-02-28 MED ORDER — HYDROMORPHONE HCL PF 1 MG/ML IJ SOLN
INTRAMUSCULAR | Status: AC
  Administered 2012-02-28: 1 mg via INTRAVENOUS
  Filled 2012-02-28: qty 1

## 2012-02-28 MED ORDER — BUPIVACAINE-EPINEPHRINE 0.25% -1:200000 IJ SOLN
INTRAMUSCULAR | Status: DC | PRN
  Administered 2012-02-28: 20 mL

## 2012-02-28 MED ORDER — METOPROLOL SUCCINATE ER 50 MG PO TB24
50.0000 mg | ORAL_TABLET | Freq: Every day | ORAL | Status: DC
  Administered 2012-02-29 – 2012-03-01 (×2): 50 mg via ORAL
  Filled 2012-02-28 (×2): qty 1

## 2012-02-28 MED ORDER — EZETIMIBE-SIMVASTATIN 10-20 MG PO TABS
1.0000 | ORAL_TABLET | Freq: Every day | ORAL | Status: DC
  Administered 2012-02-28 – 2012-02-29 (×2): 1 via ORAL
  Filled 2012-02-28 (×3): qty 1

## 2012-02-28 MED ORDER — METOCLOPRAMIDE HCL 5 MG/ML IJ SOLN: 5.0000 mg | Freq: Three times a day (TID) | INTRAMUSCULAR | Status: DC | PRN

## 2012-02-28 MED ORDER — NITROGLYCERIN 0.4 MG SL SUBL: 0.4000 mg | SUBLINGUAL_TABLET | SUBLINGUAL | Status: DC | PRN

## 2012-02-28 MED ORDER — MIDAZOLAM HCL 5 MG/5ML IJ SOLN
INTRAMUSCULAR | Status: DC | PRN
  Administered 2012-02-28: 2 mg via INTRAVENOUS

## 2012-02-28 MED ORDER — FLEET ENEMA 7-19 GM/118ML RE ENEM: 1.0000 | ENEMA | Freq: Once | RECTAL | Status: AC | PRN

## 2012-02-28 MED ORDER — DIPHENHYDRAMINE HCL 12.5 MG/5ML PO ELIX: 12.5000 mg | ORAL_SOLUTION | ORAL | Status: DC | PRN

## 2012-02-28 MED ORDER — DOCUSATE SODIUM 100 MG PO CAPS
100.0000 mg | ORAL_CAPSULE | Freq: Two times a day (BID) | ORAL | Status: DC
  Administered 2012-02-28 – 2012-03-01 (×4): 100 mg via ORAL
  Filled 2012-02-28 (×5): qty 1

## 2012-02-28 MED ORDER — DOXAZOSIN MESYLATE 8 MG PO TABS
8.0000 mg | ORAL_TABLET | Freq: Every day | ORAL | Status: DC
  Administered 2012-02-28 – 2012-02-29 (×2): 8 mg via ORAL
  Filled 2012-02-28 (×3): qty 1

## 2012-02-28 MED ORDER — BUPIVACAINE-EPINEPHRINE PF 0.5-1:200000 % IJ SOLN
INTRAMUSCULAR | Status: DC | PRN
  Administered 2012-02-28: 30 mL

## 2012-02-28 MED ORDER — BUPIVACAINE ON-Q PAIN PUMP (FOR ORDER SET NO CHG)
INJECTION | Status: DC
Start: 2012-02-28 — End: 2012-02-29
  Filled 2012-02-28: qty 1

## 2012-02-28 MED ORDER — PROPOFOL 10 MG/ML IV EMUL
INTRAVENOUS | Status: DC | PRN
  Administered 2012-02-28: 160 mg via INTRAVENOUS

## 2012-02-28 MED ORDER — SODIUM CHLORIDE 0.9 % IV SOLN: INTRAVENOUS | Status: DC

## 2012-02-28 MED ORDER — FENTANYL CITRATE 0.05 MG/ML IJ SOLN
INTRAMUSCULAR | Status: DC | PRN
  Administered 2012-02-28 (×2): 50 ug via INTRAVENOUS

## 2012-02-28 MED ORDER — AMLODIPINE BESY-BENAZEPRIL HCL 5-20 MG PO CAPS: 1.0000 | ORAL_CAPSULE | Freq: Every day | ORAL | Status: DC

## 2012-02-28 MED ORDER — HYDROMORPHONE HCL PF 1 MG/ML IJ SOLN
0.5000 mg | INTRAMUSCULAR | Status: DC | PRN
  Administered 2012-02-28: 1 mg via INTRAVENOUS
  Filled 2012-02-28: qty 1

## 2012-02-28 MED ORDER — OXYCODONE HCL 10 MG PO TB12
10.0000 mg | ORAL_TABLET | Freq: Two times a day (BID) | ORAL | Status: DC
  Administered 2012-02-28 – 2012-03-01 (×4): 10 mg via ORAL
  Filled 2012-02-28 (×4): qty 1

## 2012-02-28 MED ORDER — HYDROCODONE-ACETAMINOPHEN 5-325 MG PO TABS
1.5000 | ORAL_TABLET | ORAL | Status: DC | PRN
  Administered 2012-02-29: 1.5 via ORAL
  Administered 2012-02-29: 2 via ORAL
  Filled 2012-02-28: qty 2
  Filled 2012-02-28: qty 1

## 2012-02-28 MED ORDER — FINASTERIDE 5 MG PO TABS
5.0000 mg | ORAL_TABLET | Freq: Every day | ORAL | Status: DC
  Administered 2012-02-29 – 2012-03-01 (×2): 5 mg via ORAL
  Filled 2012-02-28 (×3): qty 1

## 2012-02-28 MED ORDER — ROCURONIUM BROMIDE 100 MG/10ML IV SOLN
INTRAVENOUS | Status: DC | PRN
  Administered 2012-02-28: 50 mg via INTRAVENOUS

## 2012-02-28 MED ORDER — PANTOPRAZOLE SODIUM 40 MG PO TBEC
40.0000 mg | DELAYED_RELEASE_TABLET | Freq: Every day | ORAL | Status: DC
  Administered 2012-02-29 – 2012-03-01 (×2): 40 mg via ORAL
  Filled 2012-02-28 (×2): qty 1

## 2012-02-28 MED ORDER — ONDANSETRON HCL 4 MG PO TABS: 4.0000 mg | ORAL_TABLET | Freq: Four times a day (QID) | ORAL | Status: DC | PRN

## 2012-02-28 MED ORDER — SENNOSIDES-DOCUSATE SODIUM 8.6-50 MG PO TABS: 1.0000 | ORAL_TABLET | Freq: Every evening | ORAL | Status: DC | PRN

## 2012-02-28 MED ORDER — CEFAZOLIN SODIUM-DEXTROSE 2-3 GM-% IV SOLR
2.0000 g | Freq: Four times a day (QID) | INTRAVENOUS | Status: AC
  Administered 2012-02-28 – 2012-02-29 (×3): 2 g via INTRAVENOUS
  Filled 2012-02-28 (×3): qty 50

## 2012-02-28 MED ORDER — FENTANYL CITRATE 0.05 MG/ML IJ SOLN
50.0000 ug | INTRAMUSCULAR | Status: DC | PRN
  Administered 2012-02-28: 50 ug via INTRAVENOUS

## 2012-02-28 MED ORDER — HYDROCODONE-ACETAMINOPHEN 7.5-325 MG PO TABS: 1.0000 | ORAL_TABLET | ORAL | Status: DC | PRN

## 2012-02-28 MED ORDER — ACETAMINOPHEN 10 MG/ML IV SOLN: 1000.0000 mg | Freq: Four times a day (QID) | INTRAVENOUS | Status: DC

## 2012-02-28 MED ORDER — SODIUM CHLORIDE 0.9 % IR SOLN
Status: DC | PRN
  Administered 2012-02-28: 1000 mL

## 2012-02-28 MED ORDER — MIDAZOLAM HCL 2 MG/2ML IJ SOLN
INTRAMUSCULAR | Status: AC
  Filled 2012-02-28: qty 2

## 2012-02-28 MED ORDER — FENTANYL CITRATE 0.05 MG/ML IJ SOLN
INTRAMUSCULAR | Status: AC
  Filled 2012-02-28: qty 2

## 2012-02-28 MED ORDER — HYDROMORPHONE HCL PF 1 MG/ML IJ SOLN
0.2500 mg | INTRAMUSCULAR | Status: DC | PRN
  Administered 2012-02-28 (×4): 0.5 mg via INTRAVENOUS

## 2012-02-28 MED ORDER — SODIUM CHLORIDE 0.9 % IV SOLN
INTRAVENOUS | Status: DC
  Administered 2012-02-28: 1000 mL via INTRAVENOUS

## 2012-02-28 MED ORDER — METOCLOPRAMIDE HCL 10 MG PO TABS: 5.0000 mg | ORAL_TABLET | Freq: Three times a day (TID) | ORAL | Status: DC | PRN

## 2012-02-28 MED ORDER — FUROSEMIDE 40 MG PO TABS
40.0000 mg | ORAL_TABLET | Freq: Every day | ORAL | Status: DC
  Administered 2012-02-28 – 2012-03-01 (×3): 40 mg via ORAL
  Filled 2012-02-28 (×3): qty 1

## 2012-02-28 SURGICAL SUPPLY — 59 items
BANDAGE ESMARK 6X9 LF (GAUZE/BANDAGES/DRESSINGS) ×1 IMPLANT
BLADE SAGITTAL 13X1.27X60 (BLADE) ×3 IMPLANT
BLADE SAW SGTL 83.5X18.5 (BLADE) ×2 IMPLANT
BNDG CMPR 9X6 STRL LF SNTH (GAUZE/BANDAGES/DRESSINGS) ×1
BNDG ESMARK 6X9 LF (GAUZE/BANDAGES/DRESSINGS) ×2
BOWL SMART MIX CTS (DISPOSABLE) ×2 IMPLANT
CATH KIT ON Q 5IN SLV (PAIN MANAGEMENT) ×2 IMPLANT
CEMENT BONE SIMPLEX SPEEDSET (Cement) ×4 IMPLANT
CLOTH BEACON ORANGE TIMEOUT ST (SAFETY) ×2 IMPLANT
COVER BACK TABLE 24X17X13 BIG (DRAPES) ×1 IMPLANT
COVER SURGICAL LIGHT HANDLE (MISCELLANEOUS) ×3 IMPLANT
CUFF TOURNIQUET SINGLE 34IN LL (TOURNIQUET CUFF) ×2 IMPLANT
DRAPE EXTREMITY T 121X128X90 (DRAPE) ×2 IMPLANT
DRAPE INCISE IOBAN 66X45 STRL (DRAPES) ×4 IMPLANT
DRAPE PROXIMA HALF (DRAPES) ×2 IMPLANT
DRAPE U-SHAPE 47X51 STRL (DRAPES) ×2 IMPLANT
DRSG ADAPTIC 3X8 NADH LF (GAUZE/BANDAGES/DRESSINGS) ×1 IMPLANT
DRSG PAD ABDOMINAL 8X10 ST (GAUZE/BANDAGES/DRESSINGS) ×1 IMPLANT
DURAPREP 26ML APPLICATOR (WOUND CARE) ×4 IMPLANT
ELECT REM PT RETURN 9FT ADLT (ELECTROSURGICAL) ×2
ELECTRODE REM PT RTRN 9FT ADLT (ELECTROSURGICAL) ×1 IMPLANT
EVACUATOR 1/8 PVC DRAIN (DRAIN) ×2 IMPLANT
GLOVE BIOGEL M 7.0 STRL (GLOVE) IMPLANT
GLOVE BIOGEL PI IND STRL 7.0 (GLOVE) IMPLANT
GLOVE BIOGEL PI IND STRL 7.5 (GLOVE) IMPLANT
GLOVE BIOGEL PI IND STRL 8.5 (GLOVE) ×2 IMPLANT
GLOVE BIOGEL PI INDICATOR 7.0 (GLOVE) ×1
GLOVE BIOGEL PI INDICATOR 7.5 (GLOVE)
GLOVE BIOGEL PI INDICATOR 8.5 (GLOVE) ×2
GLOVE SURG ORTHO 8.0 STRL STRW (GLOVE) ×5 IMPLANT
GLOVE SURG SS PI 6.5 STRL IVOR (GLOVE) ×1 IMPLANT
GOWN PREVENTION PLUS XLARGE (GOWN DISPOSABLE) ×5 IMPLANT
GOWN STRL NON-REIN LRG LVL3 (GOWN DISPOSABLE) ×2 IMPLANT
HANDPIECE INTERPULSE COAX TIP (DISPOSABLE) ×2
HOOD PEEL AWAY FACE SHEILD DIS (HOOD) ×7 IMPLANT
KIT BASIN OR (CUSTOM PROCEDURE TRAY) ×2 IMPLANT
KIT ROOM TURNOVER OR (KITS) ×2 IMPLANT
MANIFOLD NEPTUNE II (INSTRUMENTS) ×2 IMPLANT
NEEDLE 22X1 1/2 (OR ONLY) (NEEDLE) ×1 IMPLANT
NS IRRIG 1000ML POUR BTL (IV SOLUTION) ×2 IMPLANT
PACK TOTAL JOINT (CUSTOM PROCEDURE TRAY) ×2 IMPLANT
PAD ARMBOARD 7.5X6 YLW CONV (MISCELLANEOUS) ×3 IMPLANT
PADDING CAST COTTON 6X4 STRL (CAST SUPPLIES) ×1 IMPLANT
POSITIONER HEAD PRONE TRACH (MISCELLANEOUS) ×2 IMPLANT
SET HNDPC FAN SPRY TIP SCT (DISPOSABLE) ×1 IMPLANT
SPONGE GAUZE 4X4 12PLY (GAUZE/BANDAGES/DRESSINGS) ×1 IMPLANT
STAPLER VISISTAT 35W (STAPLE) ×2 IMPLANT
SUCTION FRAZIER TIP 10 FR DISP (SUCTIONS) ×1 IMPLANT
SUT BONE WAX W31G (SUTURE) ×2 IMPLANT
SUT VIC AB 0 CTB1 27 (SUTURE) ×4 IMPLANT
SUT VIC AB 1 CT1 27 (SUTURE) ×6
SUT VIC AB 1 CT1 27XBRD ANBCTR (SUTURE) ×4 IMPLANT
SUT VIC AB 2-0 CT1 27 (SUTURE) ×4
SUT VIC AB 2-0 CT1 TAPERPNT 27 (SUTURE) ×2 IMPLANT
SYR CONTROL 10ML LL (SYRINGE) ×1 IMPLANT
TOWEL OR 17X24 6PK STRL BLUE (TOWEL DISPOSABLE) ×2 IMPLANT
TOWEL OR 17X26 10 PK STRL BLUE (TOWEL DISPOSABLE) ×2 IMPLANT
TRAY FOLEY CATH 14FR (SET/KITS/TRAYS/PACK) ×1 IMPLANT
WATER STERILE IRR 1000ML POUR (IV SOLUTION) ×5 IMPLANT

## 2012-02-28 NOTE — Progress Notes (Signed)
Orthopedic Tech Progress Note Patient Details:  Nicholas [name] Jan 07, 1934 [id number]  CPM Right Knee CPM Right Knee: On Right Knee Flexion (Degrees): 90  Right Knee Extension (Degrees): 0    Cammer, Mickie Bail 02/28/2012, 2:13 PM

## 2012-02-28 NOTE — H&P (Signed)
Nicholas [name] MRN:  [id number] DOB/SEX:  1934-03-27/male  CHIEF COMPLAINT:  Painful right Knee  HISTORY: Patient is a 78 y.o. male presented with a history of pain in the right knee. Onset of symptoms was gradual starting several years ago with gradually worsening course since that time. The patient noted no past surgery on the right knee. Prior procedures on the knee include arthroscopy. Patient has been treated conservatively with over-the-counter NSAIDs and activity modification. Patient currently rates pain in the knee at 8 out of 10 with activity. There is pain at night.  PAST MEDICAL HISTORY: Patient Active Problem List  Diagnoses Date Noted  . BPH (benign prostatic hyperplasia) 09/21/2011  . Hx of CABG 05/27/2011  . Asymptomatic PVCs 05/27/2011  . Personal history of malignant neoplasm of rectum, rectosigmoid junction, and anus 05/14/2011  . GERD (gastroesophageal reflux disease) 05/14/2011  . Hernia of abdominal cavity 05/14/2011  . CARCINOMA, RECTUM 05/08/2009  . HYPERCHOLESTEROLEMIA 05/08/2009  . ANEMIA, IRON DEFICIENCY 05/08/2009  . HYPERTENSION 05/08/2009  . MYOCARDIAL INFARCTION 05/08/2009  . GERD 05/08/2009  . DIVERTICULOSIS, COLON 05/08/2009  . DEGENERATIVE JOINT DISEASE, GENERALIZED 05/08/2009  . COLONIC POLYPS, ADENOMATOUS, HX OF 05/08/2009   Past Medical History  Diagnosis Date  . 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 Pure hypercholesterolemia     takes Vytorin daily  . Personal history of colonic polyps     adenomatous  . Diverticulosis of colon (without mention of hemorrhage)   . Cardiovascular disease   . IHD (ischemic heart disease)   . History of colon cancer   . Cancer     colon  . Hypertension     takes Amlodipine,Metoprolol,and Cardura daily  . Coronary artery disease   . Acute myocardial infarction, unspecified site, episode of care unspecified   . MI (myocardial infarction) 1993  . CHF (congestive heart failure)     takes Lasix daily  .  H/O blood clots 1993  . Dysrhythmia   . Shortness of breath     with exertion  . Arthritis   . Joint pain   . Joint swelling   . Edema     to right leg-below knee;not any different than when he saw brackbill in jan 2013  . Bruises easily     takes ASA daily  . Skin cancer     nose  . GERD (gastroesophageal reflux disease)     takes Protonix daily  . Diarrhea   . Constipation   . Enlarged prostate     takes Finasteride daily  . Blood transfusion 2008  . Insomnia    Past Surgical History  Procedure Date  . Shoulder surgery 2007    left  . Cataract extraction     right  . 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 Coronary angioplasty 05/22/92  . Colostomy 2008  . Colonoscopy   . Coronary artery bypass graft 1993  . Colon surgery 2008    colectomy  . Knee arthroscopy 2010    right      MEDICATIONS:   No prescriptions prior to admission    ALLERGIES:   Allergies  Allergen Reactions  . Ciprofloxacin Other (See Comments)    Reaction unknown  . Hctz (Hydrochlorothiazide)     DIZZINESS   . Lipitor (Atorvastatin Calcium)     MUSCLE PAIN  . Niaspan (Niacin (Antihyperlipidemic))     FLUSHING   . Oxycodone-Acetaminophen Other (See Comments)    Reaction unknown  . Sulfonamide Derivatives Other (See Comments)  Reaction unkonwn    REVIEW OF SYSTEMS:  Pertinent items are noted in HPI.   FAMILY HISTORY:   Family History  Problem Relation Age of Onset  . Pancreatic cancer Father   . Cancer Father   . Diabetes Son   . Emphysema Mother   . Anesthesia problems Neg Hx   . Hypotension Neg Hx   . Malignant hyperthermia Neg Hx   . Pseudochol deficiency Neg Hx     SOCIAL HISTORY:   History  Substance Use Topics  . Smoking status: Former Smoker    Quit date: 05/26/1991  . Smokeless tobacco: Current User  . Alcohol Use: No     EXAMINATION:  Vital signs in last 24 hours:    General appearance: alert, cooperative and no distress Lungs: clear to  auscultation bilaterally Heart: regular rate and rhythm, S1, S2 normal, no murmur, click, rub or gallop Abdomen: soft, non-tender; bowel sounds normal; no masses,  no organomegaly Extremities: extremities normal, atraumatic, no cyanosis or edema and Homans sign is negative, no sign of DVT Pulses: 2+ and symmetric Skin: Skin color, texture, turgor normal. No rashes or lesions Neurologic: Alert and oriented X 3, normal strength and tone. Normal symmetric reflexes. Normal coordination and gait  Musculoskeletal:  ROM 0-110, Ligaments intac,  Imaging Review Plain radiographs demonstrate severe degenerative joint disease of the right knee. The overall alignment is mild varus. The bone quality appears to be excellent for age and reported activity level.  Assessment/Plan: End stage arthritis, right knee   The patient history, physical examination and imaging studies are consistent with advanced degenerative joint disease of the right knee. The patient has failed conservative treatment.  The clearance notes were reviewed.  After discussion with the patient it was felt that Total Knee Replacement was indicated. The procedure,  risks, and benefits of total knee arthroplasty were presented and reviewed. The risks including but not limited to aseptic loosening, infection, blood clots, vascular injury, stiffness, patella tracking problems complications among others were discussed. The patient acknowledged the explanation, agreed to proceed with the plan.  Keenan Trefry 02/28/2012, 7:31 AM

## 2012-02-28 NOTE — Op Note (Signed)
TOTAL KNEE REPLACEMENT OPERATIVE NOTE:  02/28/2012  2:16 PM  PATIENT:  Nicholas [name]  78 y.o. male  PRE-OPERATIVE DIAGNOSIS:  osteoarthritis right knee  POST-OPERATIVE DIAGNOSIS:  osteoarthritis right knee  PROCEDURE:  Procedure(s): TOTAL KNEE ARTHROPLASTY  SURGEON:  Surgeon(s): Raymon Mutton, MD  PHYSICIAN ASSISTANT: Altamese Cabal, [facility]  ANESTHESIA:   general  DRAINS: Hemovac and On-Q Marcaine Pain Pump  SPECIMEN: None  COUNTS:  Correct  TOURNIQUET:   Total Tourniquet Time Documented: Thigh (Right) - 53 minutes  DICTATION:  Indication for procedure:    The patient is a 78 y.o. male who has failed conservative treatment for osteoarthritis right knee.  Informed consent was obtained prior to anesthesia. The risks versus benefits of the operation were explain and in a way the patient can, and did, understand.   Description of procedure:     The patient was taken to the operating room and placed under anesthesia.  The patient was positioned in the usual fashion taking care that all body parts were adequately padded and/or protected.  I foley catheter was not placed.  A tourniquet was applied and the leg prepped and draped in the usual sterile fashion.  The extremity was exsanguinated with the esmarch and tourniquet inflated to 350 mmHg.  Pre-operative range of motion was 0-95 degrees only.  The knee was in 5 degree of mild valgus.  A midline incision approximately 6-7 inches long was made with a #10 blade.  A new blade was used to make a parapatellar arthrotomy going 2-3 cm into the quadriceps tendon, over the patella, and alongside the medial aspect of the patellar tendon.  A synovectomy was then performed with the #10 blade and forceps. I then elevated the deep MCL off the medial tibial metaphysis subperiosteally around to the semimembranosus attachment.    I everted the patella and used calipers to measure patellar thickness.  I used the reamer to ream down to appropriate  thickness to recreate the native thickness.  I then removed excess bone with the rongeur and sagittal saw.  I used the appropriately sized template and drilled the three lug holes.  I then put the trial in place and measured the thickness with the calipers to ensure recreation of the native thickness.  The trial was then removed and the patella subluxed and the knee brought into flexion.  A homan retractor was place to retract and protect the patella and lateral structures.  A Z-retractor was place medially to protect the medial structures.  The extra-medullary alignment system was used to make cut the tibial articular surface perpendicular to the anamotic axis of the tibia and in 3 degrees of posterior slope.  The cut surface and alignment jig was removed.  I then used the intramedullary alignment guide to make a 3 valgus cut on the distal femur.  I then marked out the epicondylar axis on the distal femur.  The posterior condylar axis measured 5 degrees.  I then used the anterior referencing sizer and measured the femur to be a size E.  The 4-In-1 cutting block was screwed into place in external rotation matching the posterior condylar angle, making our cuts perpendicular to the epicondylar axis.  Anterior, posterior and chamfer cuts were made with the sagittal saw.  The cutting block and cut pieces were removed.  A lamina spreader was placed in 90 degrees of flexion.  The ACL, PCL, menisci, and posterior condylar osteophytes were removed.  A 10 mm spacer blocked was found to  offer good flexion and extension gap balance after minimal in degree releasing.   The scoop retractor was then placed and the femoral finishing block was pinned in place.  The small sagittal saw was used as well as the lug drill to finish the femur.  The block and cut surfaces were removed and the medullary canal hole filled with autograft bone from the cut pieces.  The tibia was delivered forward in deep flexion and external rotation.   A size 5 tray was selected and pinned into place centered on the medial 1/3 of the tibial tubercle.  The reamer and keel was used to prepare the tibia through the tray.    I then trialed with the size E femur, size 5 tibia, a 10 mm insert and the 32 patella.  I had excellent flexion/extension gap balance, excellent patella tracking.  Flexion was full and beyond 120 degrees; extension was zero.  These components were chosen and the staff opened them to me on the back table while the knee was lavaged copiously and the cement mixed.  I cemented in the components and removed all excess cement.  The polyethylene tibial component was snapped into place and the knee placed in extension while cement was hardening.  The capsule was infilltrated with 20cc of .25% Marcaine with epinephrine.  A hemovac was place in the joint exiting superolaterally.  A pain pump was place superomedially superficial to the arthrotomy.  Once the cement was hard, the tourniquet was let down.  Hemostasis was obtained.  The arthrotomy was closed with figure-8 #1 vicryl sutures.  The deep soft tissues were closed with #0 vicryls and the subcuticular layer closed with a running #2-0 vicryl.  The skin was reapproximated and closed with skin staples.  The wound was dressed with xeroform, 4 x4's, 2 ABD sponges, a single layer of webril and a TED stocking.   The patient was then awakened, extubated, and taken to the recovery room in stable condition.  BLOOD LOSS:  300cc DRAINS: 1 hemovac, 1 pain catheter COMPLICATIONS:  None.  PLAN OF CARE: Admit to inpatient   PATIENT DISPOSITION:  PACU - hemodynamically stable.   Delay start of Pharmacological VTE agent (>24hrs) due to surgical blood loss or risk of bleeding:  not applicable  Please fax a copy of this op note to my office at [phone] (please only include page 1 and 2 of the Case Information op note)

## 2012-02-28 NOTE — Progress Notes (Signed)
Primary care CXR reading noted on fax sheet per Dr. Ladene Artist can cancel CXR.

## 2012-02-28 NOTE — Anesthesia Postprocedure Evaluation (Signed)
Anesthesia Post Note  Patient: Nicholas [name]  Procedure(s) Performed: Procedure(s) (LRB): TOTAL KNEE ARTHROPLASTY (Right)  Anesthesia type: General  Patient location: PACU  Post pain: Pain level controlled and Adequate analgesia  Post assessment: Post-op Vital signs reviewed, Patient's Cardiovascular Status Stable, Respiratory Function Stable, Patent Airway and Pain level controlled  Last Vitals:  Filed Vitals:   02/28/12 1250  BP: 133/56  Pulse:   Temp:   Resp:     Post vital signs: Reviewed and stable  Level of consciousness: awake, alert  and oriented  Complications: No apparent anesthesia complications

## 2012-02-28 NOTE — Anesthesia Procedure Notes (Addendum)
Procedure Name: Intubation Date/Time: 02/28/2012 10:42 AM Performed by: Rossie Muskrat Pre-anesthesia Checklist: Patient identified, Timeout performed, Emergency Drugs available and Suction available Patient Re-evaluated:Patient Re-evaluated prior to inductionOxygen Delivery Method: Circle system utilized Preoxygenation: Pre-oxygenation with 100% oxygen Intubation Type: IV induction Ventilation: Mask ventilation without difficulty Laryngoscope Size: Miller and 2 Grade View: Grade I Tube type: Oral Tube size: 7.5 mm Number of attempts: 1 Airway Equipment and Method: Stylet Placement Confirmation: ETT inserted through vocal cords under direct vision,  breath sounds checked- equal and bilateral and positive ETCO2 Secured at: 20 cm Tube secured with: Tape Dental Injury: Teeth and Oropharynx as per pre-operative assessment    Anesthesia Regional Block:  Femoral nerve block  Pre-Anesthetic Checklist: ,, timeout performed, Correct Patient, Correct Site, Correct Laterality, Correct Procedure,, site marked, risks and benefits discussed, Surgical consent,  Pre-op evaluation,  At surgeon's request and post-op pain management  Laterality: Right  Prep: chloraprep       Needles:  Injection technique: Single-shot  Needle Type: Echogenic Stimulator Needle     Needle Length: 9cm  Needle Gauge: 21    Additional Needles:  Procedures: nerve stimulator Femoral nerve block  Nerve Stimulator or Paresthesia:  Response: Quadriceps muscle contraction, 0.45 mA,   Additional Responses:   Narrative:  Start time: 02/28/2012 10:00 AM End time: 02/28/2012 10:15 AM Injection made incrementally with aspirations every 5 mL.  Performed by: Personally  Anesthesiologist: Dr Chaney Malling  Additional Notes: Functioning IV was confirmed and monitors were applied.  A 90mm 21ga Arrow echogenic stimulator needle was used. Sterile prep and drape,hand hygiene and sterile gloves were used.  Negative aspiration and  negative test dose prior to incremental administration of local anesthetic. The patient tolerated the procedure well.    Femoral nerve block

## 2012-02-28 NOTE — Progress Notes (Signed)
Orthopedic Tech Progress Note Patient Details:  Nicholas [name] [birth year] [id number]  Patient ID: Nicholas [name], male   DOB: 1934/10/11, 78 y.o.   MRN: [id number]   Nicholas [name] 02/28/2012, 2:13 PM Trapeze bar

## 2012-02-28 NOTE — Transfer of Care (Signed)
Immediate Anesthesia Transfer of Care Note  Patient: Nicholas [name]  Procedure(s) Performed: Procedure(s) (LRB): TOTAL KNEE ARTHROPLASTY (Right)  Patient Location: PACU  Anesthesia Type: General  Level of Consciousness: awake and alert   Airway & Oxygen Therapy: Patient Spontanous Breathing and Patient connected to nasal cannula oxygen  Post-op Assessment: Report given to PACU RN, Post -op Vital signs reviewed and stable and Patient moving all extremities X 4  Post vital signs: reviewed & stable  Complications: No apparent anesthesia complications

## 2012-02-28 NOTE — Anesthesia Preprocedure Evaluation (Signed)
Anesthesia Evaluation  Patient identified by MRN, date of birth, ID band Patient awake    Reviewed: Allergy & Precautions, H&P , NPO status , Patient's Chart, lab work & pertinent test results  Airway Mallampati: II  Neck ROM: full    Dental   Pulmonary shortness of breath, former smoker         Cardiovascular hypertension, + CAD, + Past MI, + CABG and +CHF + dysrhythmias     Neuro/Psych    GI/Hepatic GERD-  ,  Endo/Other    Renal/GU      Musculoskeletal   Abdominal   Peds  Hematology   Anesthesia Other Findings   Reproductive/Obstetrics                           Anesthesia Physical Anesthesia Plan  ASA: III  Anesthesia Plan: General and Regional   Post-op Pain Management: MAC Combined w/ Regional for Post-op pain   Induction: Intravenous  Airway Management Planned: Oral ETT  Additional Equipment:   Intra-op Plan:   Post-operative Plan: Extubation in OR  Informed Consent: I have reviewed the patients History and Physical, chart, labs and discussed the procedure including the risks, benefits and alternatives for the proposed anesthesia with the patient or authorized representative who has indicated his/her understanding and acceptance.     Plan Discussed with: CRNA and Surgeon  Anesthesia Plan Comments:         Anesthesia Quick Evaluation

## 2012-02-28 NOTE — Progress Notes (Signed)
Report received from Sharon H. RN. 

## 2012-02-29 LAB — COMPREHENSIVE METABOLIC PANEL
AST: 36 U/L (ref 0–37)
CO2: 27 mEq/L (ref 19–32)
Calcium: 9 mg/dL (ref 8.4–10.5)
Chloride: 104 mEq/L (ref 96–112)
Creatinine, Ser: 1.56 mg/dL — ABNORMAL HIGH (ref 0.50–1.35)
GFR calc Af Amer: 48 mL/min — ABNORMAL LOW (ref >90)
GFR calc non Af Amer: 42 mL/min — ABNORMAL LOW (ref >90)
Glucose, Bld: 135 mg/dL — ABNORMAL HIGH (ref 70–99)
Total Bilirubin: 0.5 mg/dL (ref 0.3–1.2)

## 2012-02-29 NOTE — Plan of Care (Signed)
Problem: Consults Goal: Diagnosis- Total Joint Replacement Primary Total Knee     

## 2012-02-29 NOTE — Progress Notes (Signed)
Physical Therapy Treatment Note   02/29/12 1420  PT Visit Information  Last PT Received On 02/29/12  Precautions  Precautions Knee  Restrictions  RLE Weight Bearing WBAT  Bed Mobility  Sit to Supine 3: Mod assist;HOB flat  Sit to Supine - Details (indicate cue type and reason) assist for bilat LEs  Transfers  Sit to Stand 3: Mod assist;With upper extremity assist;From chair/3-in-1 (x2, max directional v/c's)  Ambulation/Gait  Ambulation/Gait Assistance 4: Min assist  Ambulation/Gait Assistance Details (indicate cue type and reason) max verbal cues to complete R LE quad set to limit R knee buckling, max v/cs for sequencing with RW  Ambulation Distance (Feet) 40 Feet  Assistive device Rolling walker  Gait Pattern Step-to pattern;Decreased step length - right;Decreased stance time - right  Gait velocity decreased, short shuffled steps  Stairs No  Wheelchair Mobility  Wheelchair Mobility No  Posture/Postural Control  Posture/Postural Control No significant limitations  Total Joint Exercises  Heel Slides AAROM;20 reps;Seated;Right  PT - End of Session  Equipment Utilized During Treatment Gait belt  Activity Tolerance Patient tolerated treatment well  Patient left in bed;in CPM;with call bell in reach;with family/visitor present (CPM set 0-55 degrees)  Nurse Communication (RN notified pt to be placed in CPM)  General  Behavior During Session [facility] for tasks performed  Cognition [facility] for tasks performed  PT - Assessment/Plan  Comments on Treatment Session Patient with improved ambulation tolerance however remains to have delayed processing requiring freq v/cs for sequencing. Patient con't to desire to return home but currently requiring increased assist for all transfers in which wife will not be able to complete. will re-assess 3/6. Pt to most likely improve with transfer and ambulation abiltiy.  PT Plan Discharge plan remains appropriate  PT Frequency 7X/week  Follow Up  Recommendations Home health PT;Supervision/Assistance - 24 hour  Acute Rehab PT Goals  PT Goal: Supine/Side to Sit - Progress Progressing toward goal  PT Goal: Sit to Stand - Progress Progressing toward goal  PT Goal: Ambulate - Progress Progressing toward goal  PT Goal: Perform Home Exercise Program - Progress Progressing toward goal    Pain: 7/10 R LE during ambulation  Lewis Shock, PT, DPT Pager #: [phone] Office #: [phone]

## 2012-02-29 NOTE — Progress Notes (Signed)
Physical Therapy Evaluation Note  Past Medical History  Diagnosis Date  . 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 Pure hypercholesterolemia     takes Vytorin daily  . Personal history of colonic polyps     adenomatous  . Diverticulosis of colon (without mention of hemorrhage)   . Cardiovascular disease   . IHD (ischemic heart disease)   . History of colon cancer   . Cancer     colon  . Hypertension     takes Amlodipine,Metoprolol,and Cardura daily  . Coronary artery disease   . Acute myocardial infarction, unspecified site, episode of care unspecified   . MI (myocardial infarction) 1993  . CHF (congestive heart failure)     takes Lasix daily  . H/O blood clots 1993  . Dysrhythmia   . Shortness of breath     with exertion  . Arthritis   . Joint pain   . Joint swelling   . Edema     to right leg-below knee;not any different than when he saw brackbill in jan 2013  . Bruises easily     takes ASA daily  . Skin cancer     nose  . GERD (gastroesophageal reflux disease)     takes Protonix daily  . Diarrhea   . Constipation   . Enlarged prostate     takes Finasteride daily  . Blood transfusion 2008  . Insomnia     Past Surgical History  Procedure Date  . Shoulder surgery 2007    left  . Cataract extraction     right  . 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 Coronary angioplasty 05/22/92  . Colostomy 2008  . Colonoscopy   . Coronary artery bypass graft 1993  . Colon surgery 2008    colectomy  . Knee arthroscopy 2010    right      02/29/12 1054  PT Visit Information  Last PT Received On 02/29/12  Precautions  Precautions Knee  Required Braces or Orthoses No  Restrictions  RLE Weight Bearing WBAT  Home Living  Lives With Spouse  Receives Help From Family  Type of Home House  Home Layout One level  Home Access Stairs to enter  Entrance Stairs-Rails Left  Entrance Stairs-Number of Steps 4  Bathroom Accessibility Yes  How Accessible Accessible via  walker  Home Adaptive Equipment Walker - rolling;Bedside commode/3-in-1  Additional Comments HH agency already delivered DME  Prior Function  Level of Independence Independent with basic ADLs;Independent with gait;Independent with transfers  Able to Take Stairs? Yes (non-reciprocal)  Driving Yes  Vocation Part time employment  Vocation Requirements drives school bus  Cognition  Arousal/Alertness Awake/alert  Overall Cognitive Status Appears within functional limits for tasks assessed  Orientation Level Oriented X4  Sensation  Light Touch Appears Intact  Bed Mobility  Bed Mobility Yes  Supine to Sit 3: Mod assist;With rails;HOB flat  Supine to Sit Details (indicate cue type and reason) verbal cues for technique, assist for trunk elevation and R LE management  Transfers  Transfers Yes  Sit to Stand From bed;4: Min assist  Sit to Stand Details (indicate cue type and reason) verbal cues for hand placement and sequencing  Ambulation/Gait  Ambulation/Gait Yes  Ambulation/Gait Assistance 4: Min assist  Ambulation/Gait Assistance Details (indicate cue type and reason) verbal cues for sequencing and to maintain R quad set during stance phase  Ambulation Distance (Feet) 12 Feet  Assistive device Rolling walker  Gait Pattern Step-to pattern;Decreased step length -  right;Decreased stance time - right  Gait velocity decreased  Stairs No  Wheelchair Mobility  Wheelchair Mobility No  Posture/Postural Control  Posture/Postural Control No significant limitations  Balance  Balance Assessed No  RUE Assessment  RUE Assessment WFL  LUE Assessment  LUE Assessment WFL (except L shld flex limited to 60 deg from prev. sx)  RLE Assessment  RLE Assessment Not tested (secondary to R TKA)  LLE Assessment  LLE Assessment WFL  Cervical Assessment  Cervical Assessment [facility]  Thoracic Assessment  Thoracic Assessment [facility]  Lumbar Assessment  Lumbar Assessment [facility]  Exercises  Exercises Total Joint    Total Joint Exercises  Ankle Circles/Pumps AROM;Both;10 reps;Supine  Quad Sets AROM;Right;10 reps;Supine  PT - End of Session  Equipment Utilized During Treatment Gait belt  Activity Tolerance Patient tolerated treatment well  Patient left in chair;with call bell in reach;with family/visitor present  Nurse Communication Mobility status for transfers;Mobility status for ambulation  CPM Right Knee  CPM Right Knee Off  General  Behavior During Session [facility] for tasks performed  Cognition [facility] for tasks performed  PT Assessment  Clinical Impression Statement Pt s/p R TKA presenting with decreased R active/passive knee ROM and decreased R LE strength. Pt highly motivated and will most likely be able to return home with spouse.  PT Recommendation/Assessment Patient will need skilled PT in the acute care venue  PT Problem List Decreased strength;Decreased range of motion;Decreased activity tolerance;Decreased balance;Decreased mobility;Decreased coordination  PT Therapy Diagnosis  Difficulty walking;Abnormality of gait;Generalized weakness;Acute pain  PT Plan  PT Frequency 7X/week  PT Treatment/Interventions DME instruction;Gait training;Stair training;Functional mobility training;Therapeutic activities;Therapeutic exercise  PT Recommendation  Follow Up Recommendations Home health PT;Supervision/Assistance - 24 hour  Equipment Recommended None recommended by PT  Individuals Consulted  Consulted and Agree with Results and Recommendations Patient  Acute Rehab PT Goals  PT Goal Formulation With patient  Time For Goal Achievement 7 days  Pt will go Supine/Side to Sit with modified independence;with HOB 0 degrees  PT Goal: Supine/Side to Sit - Progress Goal set today  Pt will go Sit to Stand with supervision  PT Goal: Sit to Stand - Progress Goal set today  Pt will Ambulate 51 - 150 feet;with modified independence;with least restrictive assistive device  PT Goal: Ambulate - Progress Goal set  today  Pt will Go Up / Down Stairs 3-5 stairs;with supervision;with rail(s)  PT Goal: Up/Down Stairs - Progress Goal set today  Pt will Perform Home Exercise Program Independently  PT Goal: Perform Home Exercise Program - Progress Goal set today    Pain: 10/10 R knee pain during ambulation, 5/10 R knee pain at rest  Lewis Shock, PT, DPT Pager #: [phone] Office #: [phone]

## 2012-02-29 NOTE — Progress Notes (Signed)
Nicholas Spurling, MD   Altamese Cabal, PA-C [address] [place], [place], [place]  [address]                             [phone]   PROGRESS NOTE  Subjective:  negative for Chest Pain  negative for Shortness of Breath  negative for Nausea/Vomiting   negative for Calf Pain  negative for Bowel Movement   Tolerating Diet: yes         Patient reports pain as 7 on 0-10 scale.    Objective: Vital signs in last 24 hours:   Patient Vitals for the past 24 hrs:  BP Temp Temp src Pulse Resp SpO2  02/29/12 0533 101/47 mmHg 97.3 F (36.3 C) - 72  18  98 %  02/28/12 2053 116/54 mmHg 97.7 F (36.5 C) - 76  18  98 %  02/28/12 1800 100/56 mmHg 97.2 F (36.2 C) Oral 67  18  99 %  02/28/12 1700 101/66 mmHg 97.1 F (36.2 C) Oral 70  16  99 %  02/28/12 1603 117/70 mmHg 96.8 F (36 C) Oral 69  18  99 %  02/28/12 1536 129/77 mmHg - - 64  9  98 %  02/28/12 1530 - 98 F (36.7 C) - - - -  02/28/12 1521 120/67 mmHg - - 65  11  99 %  02/28/12 1505 159/73 mmHg - - - - -  02/28/12 1500 - - - 64  16  99 %  02/28/12 1445 - - - 60  9  98 %  02/28/12 1430 - - - 62  17  100 %  02/28/12 1420 131/69 mmHg - - - - -  02/28/12 1415 - - - 59  18  99 %  02/28/12 1406 133/70 mmHg - - - - -  02/28/12 1400 - - - 61  16  99 %  02/28/12 1350 131/62 mmHg - - - - -  02/28/12 1345 131/61 mmHg - - 55  12  99 %  02/28/12 1335 134/62 mmHg - - - - -  02/28/12 1330 134/62 mmHg - - 63  9  99 %  02/28/12 1320 148/63 mmHg - - - - -  02/28/12 1315 128/58 mmHg - - 61  14  100 %  02/28/12 1305 128/58 mmHg - - - - -  02/28/12 1250 133/56 mmHg - - - - -  02/28/12 1245 - - - 72  18  95 %  02/28/12 1238 133/57 mmHg 98.1 F (36.7 C) - - - -  02/28/12 1019 - - - 59  - 99 %  02/28/12 1018 - - - 58  - 98 %  02/28/12 1017 - - - 57  - 96 %  02/28/12 1016 - - - 58  - 99 %  02/28/12 1015 - - - 58  - 99 %  02/28/12 1014 - - - 58  - 99 %  02/28/12 1013 - - - 61  - 100 %  02/28/12 1012 - - - 61  - 100 %  02/28/12 1011 - - - 60   - 100 %  02/28/12 1010 - - - 56  - 100 %  02/28/12 1009 - - - 58  - 100 %  02/28/12 1008 - - - 57  - 100 %  02/28/12 1007 - - - 57  - 100 %  02/28/12 1006 - - - 56  -  100 %  02/28/12 1005 - - - 57  - 100 %  02/28/12 1004 - - - 57  - 100 %  02/28/12 1003 - - - 56  - 100 %  02/28/12 1002 - - - 57  - 100 %  02/28/12 1001 - - - 58  - 100 %  02/28/12 1000 - - - - - 98 %    @flow {1959:LAST@   Intake/Output from previous day:   03/04 0701 - 03/05 0700 In: 1520 [I.V.:1450] Out: 1855 [Urine:600; Drains:1180]   Intake/Output this shift:       Intake/Output      03/04 0701 - 03/05 0700 03/05 0701 - 03/06 0700   I.V. 1450    IV Piggyback 70    Total Intake 1520    Urine 600    Drains 1180    Blood 75    Total Output 1855    Net -335            LABORATORY DATA: No results found for this basename: WBC:7,HGB:7,HCT:7,PLT:7 in the last 168 hours  Basename 02/29/12 0510  NA 137  K 5.1  CL 104  CO2 27  BUN 17  CREATININE 1.56*  GLUCOSE 135*  CALCIUM 9.0   Lab Results  Component Value Date   INR 0.98 02/17/2012    Examination:  General appearance: alert, cooperative and no distress Extremities: Homans sign is negative, no sign of DVT  Wound Exam: clean, dry, intact   Drainage:  Scant/small amount Serosanguinous exudate  Motor Exam: EHL and FHL Intact  Sensory Exam: Deep Peroneal normal  Vascular Exam:    Assessment:    1 Day Post-Op  Procedure(s) (LRB): TOTAL KNEE ARTHROPLASTY (Right)  ADDITIONAL DIAGNOSIS:  Active Problems:  * No active hospital problems. *   Acute Blood Loss Anemia   Plan: Physical Therapy as ordered Weight Bearing as Tolerated (WBAT)  DVT Prophylaxis:  Lovenox  DISCHARGE PLAN: Home  DISCHARGE NEEDS: HHPT, CPM, Walker and 3-in-1 comode seat         Canon Gola 02/29/2012, 8:27 AM

## 2012-03-01 ENCOUNTER — Encounter (HOSPITAL_COMMUNITY): Payer: Self-pay | Admitting: Orthopedic Surgery

## 2012-03-01 LAB — COMPREHENSIVE METABOLIC PANEL
AST: 21 U/L (ref 0–37)
Albumin: 2.7 g/dL — ABNORMAL LOW (ref 3.5–5.2)
Alkaline Phosphatase: 50 U/L (ref 39–117)
BUN: 21 mg/dL (ref 6–23)
Creatinine, Ser: 1.29 mg/dL (ref 0.50–1.35)
Potassium: 4.4 mEq/L (ref 3.5–5.1)
Total Protein: 5.4 g/dL — ABNORMAL LOW (ref 6.0–8.3)

## 2012-03-01 MED ORDER — ENOXAPARIN SODIUM 40 MG/0.4ML ~~LOC~~ SOLN: 40.0000 mg | SUBCUTANEOUS | Status: DC

## 2012-03-01 MED ORDER — METHOCARBAMOL 500 MG PO TABS: 500.0000 mg | ORAL_TABLET | Freq: Four times a day (QID) | ORAL | Status: DC | PRN

## 2012-03-01 MED ORDER — METHOCARBAMOL 500 MG PO TABS: 500.0000 mg | ORAL_TABLET | Freq: Four times a day (QID) | ORAL | Status: AC | PRN

## 2012-03-01 MED ORDER — OXYCODONE HCL 10 MG PO TB12: 10.0000 mg | ORAL_TABLET | Freq: Two times a day (BID) | ORAL | Status: AC

## 2012-03-01 MED ORDER — HYDROCODONE-ACETAMINOPHEN 5-325 MG PO TABS: 1.0000 | ORAL_TABLET | ORAL | Status: AC | PRN

## 2012-03-01 MED ORDER — OXYCODONE HCL 10 MG PO TB12: 10.0000 mg | ORAL_TABLET | Freq: Two times a day (BID) | ORAL | Status: DC

## 2012-03-01 MED ORDER — HYDROCODONE-ACETAMINOPHEN 5-325 MG PO TABS: 1.0000 | ORAL_TABLET | ORAL | Status: DC | PRN

## 2012-03-01 NOTE — Progress Notes (Signed)
Physical Therapy Treatment Note   03/01/12 1358  PT Visit Information  Last PT Received On 03/01/12  Precautions  Precautions Knee  Restrictions  RLE Weight Bearing WBAT  Bed Mobility  Bed Mobility No (pt received sitting up in chair)  Transfers  Sit to Stand 3: Mod assist  Sit to Stand Details (indicate cue type and reason) from decreased surface heigth, minA from increased surface height  Ambulation/Gait  Ambulation/Gait Assistance 4: Min assist  Ambulation Distance (Feet) 75 Feet  Assistive device Rolling walker  Gait Pattern Step-to pattern;Decreased step length - right;Decreased stance time - right (narrow base of support)  Gait velocity decreased due to increased bilat UE fatigue and R knee pain  Stairs Yes  Stairs Assistance 4: Min assist  Stairs Assistance Details (indicate cue type and reason) max directional verbal cues despite pt and spouse having hand out. minA for walker management  Stair Management Technique No rails;Backwards;With walker  Number of Stairs 2   PT - End of Session  Equipment Utilized During Treatment Gait belt  Activity Tolerance Patient tolerated treatment well  Patient left in chair;with call bell in reach;with family/visitor present  General  Behavior During Session [facility] for tasks performed  Cognition [facility] for tasks performed  PT - Assessment/Plan  Comments on Treatment Session patient con't to have 8/10 R knee pain. patient with improved ambulation tolerance this date.  PT Plan Discharge plan remains appropriate;Frequency remains appropriate  PT Frequency 7X/week  Follow Up Recommendations Home health PT;Supervision/Assistance - 24 hour  Equipment Recommended 3 in 1 bedside comode  Acute Rehab PT Goals  PT Goal: Supine/Side to Sit - Progress Progressing toward goal  PT Goal: Sit to Stand - Progress Progressing toward goal  PT Goal: Ambulate - Progress Progressing toward goal  PT Goal: Up/Down Stairs - Progress Progressing toward goal  PT  Goal: Perform Home Exercise Program - Progress Progressing toward goal    Pain: 8/10 R knee pain  Lewis Shock, PT, DPT Pager #: [phone] Office #: [phone]

## 2012-03-01 NOTE — Discharge Instructions (Signed)
Diet: As you were doing prior to hospitalization   Activity:  Increase activity slowly as tolerated                  No lifting or driving for 6 weeks  Shower:  May shower without a dressing once there is no drainage from your wound.                 Do NOT wash over the wound.  If drainage remains, cover wound with saran                  Wrap and then shower.  Clean incision with betadine and change dressing                        After saran wrap removed.  Dressing:  You may change your dressing on Thursday                    Then change the dressing daily with sterile 4"x4"s gauze dressing                     And TED hose for knees.  Use paper tape to hold dressing in place                     For hips.  You may clean the incision with alcohol prior to redressing.  Weight Bearing:  Weight bearing as tolerated as taught in physical therapy.  Use a                                walker or Crutches as instructed.  To prevent constipation: you may use a stool softener such as -               Colace ( over the counter) 100 mg by mouth twice a day                Drink plenty of fluids ( prune juice may be helpful) and high fiber foods                Miralax ( over the counter) for constipation as needed.    Precautions:  If you experience chest pain or shortness of breath - call 911 immediately               For transfer to the hospital emergency department!!               If you develop a fever greater that 101 F, purulent drainage from wound,                             increased redness or drainage from wound, or calf pain -- Call the office at                                                 [phone].  Follow- Up Appointment:  Please call for an appointment to be seen on 03/14/12  [place] - [phone]                 Home Health to be provided thru [facility] [phone]

## 2012-03-01 NOTE — Discharge Summary (Signed)
PATIENT ID:      Nicholas [name]  MRN:     [id number] DOB/AGE:    03-17-1934 / 78 y.o.     DISCHARGE SUMMARY  ADMISSION DATE:    02/28/2012 DISCHARGE DATE:   03/01/2012   ADMISSION DIAGNOSIS: osteoarthritis right knee  (osteoarthritis right knee)  DISCHARGE DIAGNOSIS:  osteoarthritis right knee    ADDITIONAL DIAGNOSIS: Active Problems:  * No active hospital problems. *   Past Medical History  Diagnosis Date  . 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 Pure hypercholesterolemia     takes Vytorin daily  . Personal history of colonic polyps     adenomatous  . Diverticulosis of colon (without mention of hemorrhage)   . Cardiovascular disease   . IHD (ischemic heart disease)   . History of colon cancer   . Cancer     colon  . Hypertension     takes Amlodipine,Metoprolol,and Cardura daily  . Coronary artery disease   . Acute myocardial infarction, unspecified site, episode of care unspecified   . MI (myocardial infarction) 1993  . CHF (congestive heart failure)     takes Lasix daily  . H/O blood clots 1993  . Dysrhythmia   . Shortness of breath     with exertion  . Arthritis   . Joint pain   . Joint swelling   . Edema     to right leg-below knee;not any different than when he saw brackbill in jan 2013  . Bruises easily     takes ASA daily  . Skin cancer     nose  . GERD (gastroesophageal reflux disease)     takes Protonix daily  . Diarrhea   . Constipation   . Enlarged prostate     takes Finasteride daily  . Blood transfusion 2008  . Insomnia     PROCEDURE: Procedure(s): TOTAL KNEE ARTHROPLASTY on 02/28/2012  CONSULTS:     HISTORY:  See H&P in chart  HOSPITAL COURSE:  [name] is a 78 y.o. admitted on 02/28/2012 and found to have a diagnosis of osteoarthritis right knee.  After appropriate laboratory studies were obtained  they were taken to the operating room on 02/28/2012 and underwent Procedure(s): TOTAL KNEE ARTHROPLASTY.   They were given perioperative  antibiotics:  Anti-infectives     Start     Dose/Rate Route Frequency Ordered Stop   02/28/12 1800   ceFAZolin (ANCEF) IVPB 2 g/50 mL premix        2 g 100 mL/hr over 30 Minutes Intravenous Every 6 hours 02/28/12 1606 02/29/12 0557   02/27/12 1115   ceFAZolin (ANCEF) IVPB 2 g/50 mL premix        2 g 100 mL/hr over 30 Minutes Intravenous 60 min pre-op 02/27/12 1114 02/28/12 1041        . Blood products given:none   The remainder of the hospital course was dedicated to ambulation and strengthening.   The patient was discharged on 2 Days Post-Op in  Good condition.   DIAGNOSTIC STUDIES: Recent vital signs: Patient Vitals for the past 24 hrs:  BP Temp Pulse Resp SpO2 Height Weight  03/01/12 0618 120/75 mmHg 98.2 F (36.8 C) 71  19  92 % - -  02/29/12 2234 130/62 mmHg 97 F (36.1 C) 68  19  94 % - -  02/29/12 2129 130/62 mmHg - - - - - -  02/29/12 1257 107/50 mmHg 97.2 F (36.2 C) 56  18  98 % - -  02/29/12 0800 - - - - - 5\' 10"  (1.778 m) 101.8 kg (224 lb 6.9 oz)       Recent laboratory studies: No results found for this basename: WBC:7,HGB:7,HCT:7,PLT:7 in the last 168 hours  Basename 02/29/12 0510  NA 137  K 5.1  CL 104  CO2 27  BUN 17  CREATININE 1.56*  GLUCOSE 135*  CALCIUM 9.0   Lab Results  Component Value Date   INR 0.98 02/17/2012     Recent Radiographic Studies :  No results found.  DISCHARGE INSTRUCTIONS: Discharge Orders    Future Appointments: Provider: Department: Dept Phone: Center:   10/13/2012 9:00 AM Leighton Ruff Womack [facility] [phone] None   10/16/2012 3:00 PM [facility] [phone] [place]   10/20/2012 9:00 AM Lauretta I. Odogwu, MD [facility] [phone] None     Future Orders Please Complete By Expires   Diet - low sodium heart healthy      Call MD / Call 911      Comments:   If you experience chest pain or shortness of breath, CALL 911 and be transported to the hospital emergency room.  If you develope a fever  above 101 F, pus (white drainage) or increased drainage or redness at the wound, or calf pain, call your surgeon's office.   Constipation Prevention      Comments:   Drink plenty of fluids.  Prune juice may be helpful.  You may use a stool softener, such as Colace (over the counter) 100 mg twice a day.  Use MiraLax (over the counter) for constipation as needed.   Increase activity slowly as tolerated      Weight Bearing as taught in Physical Therapy      Comments:   Use a walker or crutches as instructed.   Driving restrictions      Comments:   No driving for 6 weeks   Lifting restrictions      Comments:   No lifting for 6 weeks   CPM      Comments:   Continuous passive motion machine (CPM):      Use the CPM from 0 to 90 for 6-8 hours per day.      You may increase by 10 per day.  You may break it up into 2 or 3 sessions per day.      Use CPM for 2 weeks or until you are told to stop.   TED hose      Comments:   Use stockings (TED hose) for 3 weeks on both leg(s).  You may remove them at night for sleeping.   Change dressing      Comments:   Change dressing on thursday, then change the dressing daily with sterile 4 x 4 inch gauze dressing and apply TED hose.  You may clean the incision with alcohol prior to redressing.   Do not put a pillow under the knee. Place it under the heel.         DISCHARGE MEDICATIONS:   Medication List  As of 03/01/2012  7:11 AM   STOP taking these medications         aspirin 81 MG tablet      potassium chloride SA 20 MEQ tablet         TAKE these medications         amLODipine-benazepril 5-20 MG per capsule   Commonly known as: LOTREL   Take 1 capsule by  mouth daily.      B-100 Complex Tabs   Take 1 tablet by mouth daily.      CALCIUM 600-D 600-400 MG-UNIT per tablet   Generic drug: Calcium Carbonate-Vitamin D   Take 1 tablet by mouth 2 (two) times daily.      doxazosin 8 MG tablet   Commonly known as: CARDURA   Take 8 mg by mouth at  bedtime.      enoxaparin 40 MG/0.4ML Soln   Commonly known as: LOVENOX   Inject 0.4 mLs (40 mg total) into the skin daily.      ezetimibe-simvastatin 10-20 MG per tablet   Commonly known as: VYTORIN   Take 1 tablet by mouth at bedtime.      finasteride 5 MG tablet   Commonly known as: PROSCAR   Take 5 mg by mouth daily.      furosemide 40 MG tablet   Commonly known as: LASIX   Take 40 mg by mouth daily.      HYDROcodone-acetaminophen 5-325 MG per tablet   Commonly known as: NORCO   Take 1-2 tablets by mouth every 4 (four) hours as needed.      methocarbamol 500 MG tablet   Commonly known as: ROBAXIN   Take 1-2 tablets (500-1,000 mg total) by mouth every 6 (six) hours as needed.      metoprolol succinate 50 MG 24 hr tablet   Commonly known as: TOPROL-XL   Take 50 mg by mouth daily.      nitroGLYCERIN 0.4 MG SL tablet   Commonly known as: NITROSTAT   Place 0.4 mg under the tongue every 5 (five) minutes as needed. For chest pain      oxyCODONE 10 MG 12 hr tablet   Commonly known as: OXYCONTIN   Take 1 tablet (10 mg total) by mouth every 12 (twelve) hours.      pantoprazole 40 MG tablet   Commonly known as: PROTONIX   Take 40 mg by mouth daily.      SAW PALMETTO PO   Take 1 capsule by mouth every evening.      vitamin C 500 MG tablet   Commonly known as: ASCORBIC ACID   Take 500 mg by mouth daily.      VITAMIN C PO   Take 1 tablet by mouth every evening.            FOLLOW UP VISIT:   Follow-up Information    Follow up with Raymon Mutton, MD. Call on 03/14/2012.   Contact information:   [address] [place] [address] [phone]          DISPOSITION:  Home    CONDITION:  Good   Namine Beahm 03/01/2012, 7:11 AM

## 2012-03-01 NOTE — Progress Notes (Signed)
  Georgena Spurling, MD   Altamese Cabal, PA-C [address] [place], [place], [place]  [address]                             [phone]   PROGRESS NOTE  Subjective:  negative for Chest Pain  negative for Shortness of Breath  negative for Nausea/Vomiting   negative for Calf Pain  negative for Bowel Movement   Tolerating Diet: yes         Patient reports pain as 4 on 0-10 scale.    Objective: Vital signs in last 24 hours:   Patient Vitals for the past 24 hrs:  BP Temp Pulse Resp SpO2 Height Weight  03/01/12 0618 120/75 mmHg 98.2 F (36.8 C) 71  19  92 % - -  02/29/12 2234 130/62 mmHg 97 F (36.1 C) 68  19  94 % - -  02/29/12 2129 130/62 mmHg - - - - - -  02/29/12 1257 107/50 mmHg 97.2 F (36.2 C) 56  18  98 % - -  02/29/12 0800 - - - - - 5\' 10"  (1.778 m) 101.8 kg (224 lb 6.9 oz)    @flow {1959:LAST@   Intake/Output from previous day:   03/05 0701 - 03/06 0700 In: 960 [P.O.:960] Out: 400 [Urine:400]   Intake/Output this shift:       Intake/Output      03/05 0701 - 03/06 0700 03/06 0701 - 03/07 0700   P.O. 960    I.V. (mL/kg)     IV Piggyback     Total Intake(mL/kg) 960 (9.4)    Urine (mL/kg/hr) 400 (0.2)    Drains     Blood     Total Output 400    Net +560            LABORATORY DATA: No results found for this basename: WBC:7,HGB:7,HCT:7,PLT:7 in the last 168 hours  Basename 02/29/12 0510  NA 137  K 5.1  CL 104  CO2 27  BUN 17  CREATININE 1.56*  GLUCOSE 135*  CALCIUM 9.0   Lab Results  Component Value Date   INR 0.98 02/17/2012    Examination:  General appearance: alert, cooperative and no distress Extremities: Homans sign is negative, no sign of DVT  Wound Exam: clean, dry, intact   Drainage:  None: wound tissue dry  Motor Exam: EHL and FHL Intact  Sensory Exam: Deep Peroneal normal  Vascular Exam:    Assessment:    2 Days Post-Op  Procedure(s) (LRB): TOTAL KNEE ARTHROPLASTY (Right)  ADDITIONAL DIAGNOSIS:  Active Problems:  * No  active hospital problems. *   Acute Blood Loss Anemia   Plan: Physical Therapy as ordered Weight Bearing as Tolerated (WBAT)  DVT Prophylaxis:  Lovenox  DISCHARGE PLAN: Home  DISCHARGE NEEDS: HHPT, CPM, Walker and 3-in-1 comode seat         Tyren Dugar 03/01/2012, 7:04 AM

## 2012-03-01 NOTE — Progress Notes (Signed)
CARE MANAGEMENT NOTE 03/01/2012  P Date Initiated:  03/01/2012  Documentation initiated by:  Vance Peper  Subjective/Objective Assessment:   78 yr old male s/p right total knee arthroplasty.     Action/Plan:   Discharge planning. Spoke with patient and wife. Preoperatively setup with [facility], no changes. Rolling walker, CPM have been delivered, he needs a 3in1, Contacted [employer] to take it to his home today.   Anticipated DC Date:  03/01/2012   Anticipated DC Plan:  HOME W HOME HEALTH SERVICES  In-house referral  NA      DC Planning Services  CM consult      [facility] Choice  HOME HEALTH  DURABLE MEDICAL EQUIPMENT   Choice offered to / List presented to:  C-3 Spouse   DME arranged  3-N-1      DME agency  [employer]     HH arranged  HH-2 PT      HH agency  [facility]   Status of service:  Completed, signed off    Discharge Disposition:  HOME W HOME HEALTH SERVICES

## 2012-03-01 NOTE — Progress Notes (Signed)
Physical Therapy Treatment Note   03/01/12 0800  Restrictions  Weight Bearing Restrictions Yes  RLE Weight Bearing WBAT  Bed Mobility  Supine to Sit 4: Min assist;HOB flat  Supine to Sit Details (indicate cue type and reason) assist for trunk elevation  Transfers  Sit to Stand 4: Min assist  Ambulation/Gait  Ambulation/Gait Assistance 4: Min assist  Ambulation/Gait Assistance Details (indicate cue type and reason) improved sequencing however tends push RW to far foward at onset of fatigue, pt with difficulty maintaining upright posture prior to surgery due to h/o of cervical/back pain  Ambulation Distance (Feet) 60 Feet (30 feet s/p stair negotiation)  Assistive device Rolling walker  Gait Pattern Step-to pattern;Decreased step length - right;Decreased stance time - right  Gait velocity decreased  Stairs Yes  Stairs Assistance 4: Min assist  Stairs Assistance Details (indicate cue type and reason) pt completed stair negotiation with PT and then with spouse demonstrating good technique. verbal cues for sequencing, handout provided for safe stair negotiation with RW going backwards  Stair Management Technique No rails;Backwards;With walker  Number of Stairs 4   Wheelchair Mobility  Wheelchair Mobility No  Total Joint Exercises  Quad Sets AROM;Right;10 reps  Heel Slides AAROM;Right;10 reps;Seated (pt tolerated approx 45 degrees of R knee ROM)  Straight Leg Raises AAROM;Right;10 reps;Seated  PT - End of Session  Equipment Utilized During Treatment Gait belt  Activity Tolerance Patient tolerated treatment well  Patient left in chair;with call bell in reach;with family/visitor present  General  Behavior During Session [facility] for tasks performed  Cognition [facility] for tasks performed  PT - Assessment/Plan  Comments on Treatment Session Patient con't to have 8/10 pain limiting overall ambulation/transfer ability in addidtion to active/passive R knee flexion. Patient safe to return home with  spouse, HHPT and recommended DME.  PT Plan Discharge plan needs to be updated  PT Frequency 7X/week  Follow Up Recommendations Home health PT;Supervision/Assistance - 24 hour  Equipment Recommended None recommended by PT  Acute Rehab PT Goals  PT Goal: Supine/Side to Sit - Progress Progressing toward goal  PT Goal: Sit to Stand - Progress Progressing toward goal  PT Goal: Ambulate - Progress Progressing toward goal  PT Goal: Up/Down Stairs - Progress Progressing toward goal  PT Goal: Perform Home Exercise Program - Progress Progressing toward goal    Pain: 8/10 R knee pain with ambulation and R LE there ex  Lewis Shock, PT, DPT Pager #: [phone] Office #: [phone]

## 2012-03-01 NOTE — Evaluation (Signed)
Occupational Therapy Evaluation Patient Details Name: Nicholas [name] MRN: [id number] DOB: 30-Jul-1934 Today's Date: 03/01/2012  Problem List:  Patient Active Problem List  Diagnoses  . CARCINOMA, RECTUM  . HYPERCHOLESTEROLEMIA  . ANEMIA, IRON DEFICIENCY  . HYPERTENSION  . MYOCARDIAL INFARCTION  . GERD  . DIVERTICULOSIS, COLON  . DEGENERATIVE JOINT DISEASE, GENERALIZED  . COLONIC POLYPS, ADENOMATOUS, HX OF  . Personal history of malignant neoplasm of rectum, rectosigmoid junction, and anus  . GERD (gastroesophageal reflux disease)  . Hernia of abdominal cavity  . Hx of CABG  . Asymptomatic PVCs  . BPH (benign prostatic hyperplasia)    Past Medical History:  Past Medical History  Diagnosis Date  . 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 Pure hypercholesterolemia     takes Vytorin daily  . Personal history of colonic polyps     adenomatous  . Diverticulosis of colon (without mention of hemorrhage)   . Cardiovascular disease   . IHD (ischemic heart disease)   . History of colon cancer   . Cancer     colon  . Hypertension     takes Amlodipine,Metoprolol,and Cardura daily  . Coronary artery disease   . Acute myocardial infarction, unspecified site, episode of care unspecified   . MI (myocardial infarction) 1993  . CHF (congestive heart failure)     takes Lasix daily  . H/O blood clots 1993  . Dysrhythmia   . Shortness of breath     with exertion  . Arthritis   . Joint pain   . Joint swelling   . Edema     to right leg-below knee;not any different than when he saw brackbill in jan 2013  . Bruises easily     takes ASA daily  . Skin cancer     nose  . GERD (gastroesophageal reflux disease)     takes Protonix daily  . Diarrhea   . Constipation   . Enlarged prostate     takes Finasteride daily  . Blood transfusion 2008  . Insomnia    Past Surgical History:  Past Surgical History  Procedure Date  . Shoulder surgery 2007    left  . Cataract extraction    right  . 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 Coronary angioplasty 05/22/92  . Colostomy 2008  . Colonoscopy   . Coronary artery bypass graft 1993  . Colon surgery 2008    colectomy  . Knee arthroscopy 2010    right   . Total knee arthroplasty 02/28/2012    Procedure: TOTAL KNEE ARTHROPLASTY;  Surgeon: Raymon Mutton, MD;  Location: [facility] OR;  Service: Orthopedics;  Laterality: Right;    OT Assessment/Plan/Recommendation OT Assessment Clinical Impression Statement: Pt. presents s/p Rt. TKA and with increased pain. All education completed and will sign off acutely and recommend D/C home with family support and HHOT OT Recommendation/Assessment: All further OT needs can be met in the next venue of care OT Problem List: Decreased strength;Decreased activity tolerance;Decreased knowledge of use of DME or AE;Decreased safety awareness OT Therapy Diagnosis : Acute pain OT Recommendation Follow Up Recommendations: Home health OT;Supervision - Intermittent Equipment Recommended: 3 in 1 bedside comode     OT Evaluation Precautions/Restrictions  Precautions Precautions: Knee Required Braces or Orthoses: No Restrictions Weight Bearing Restrictions: Yes RLE Weight Bearing: Weight bearing as tolerated Prior Functioning Home Living Lives With: Spouse Receives Help From: Family Type of Home: House Home Layout: One level Home Access: Stairs to enter Entrance Stairs-Rails: Left  Entrance Stairs-Number of Steps: 4 Bathroom Accessibility: Yes How Accessible: Accessible via walker Home Adaptive Equipment: Walker - rolling;Bedside commode/3-in-1 Additional Comments: HH agency already delivered DME Prior Function Level of Independence: Independent with basic ADLs;Independent with gait;Independent with transfers Able to Take Stairs?: Yes (non-reciprocal) Driving: Yes Vocation: Part time employment Vocation Requirements: drives school bus ADL ADL Eating/Feeding:  Performed;Independent Where Assessed - Eating/Feeding: Chair Grooming: Performed;Wash/dry hands;Wash/dry face;Set up;Other (comment) (min guard assist) Where Assessed - Grooming: Standing at sink Upper Body Bathing: Simulated;Chest;Right arm;Left arm;Abdomen;Set up Where Assessed - Upper Body Bathing: Sitting, bed Lower Body Bathing: Simulated;Set up Lower Body Bathing Details (indicate cue type and reason): With use of long handled sponge Upper Body Dressing: Performed;Set up Upper Body Dressing Details (indicate cue type and reason): with donning gown Where Assessed - Upper Body Dressing: Sitting, bed Lower Body Dressing: Performed;Minimal assistance Lower Body Dressing Details (indicate cue type and reason): with using sock aid for donning socks and use of reacher for simulating don/doff of undergarments Where Assessed - Lower Body Dressing: Sit to stand from chair Toilet Transfer: Minimal assistance;Simulated Toilet Transfer Details (indicate cue type and reason): Min verbal cues for hand placement and technique Toilet Transfer Method: Ambulating Toilet Transfer Equipment: Other (comment) Nurse, children's) Tub/Shower Transfer: Simulated;Minimal assistance Tub/Shower Transfer Details (indicate cue type and reason): Mod verbal cues for transfer technique for posterior entrance into shower with strong leg intitiating transfer Tub/Shower Transfer Method: Ambulating Tub/Shower Transfer Equipment: Shower seat with back;Walk in shower Equipment Used: Rolling walker Ambulation Related to ADLs: Pt. close supervision ~8' with RW ADL Comments: Pt. educated on use of AE for completing LB ADLs and shower transfer technique    Cognition Cognition Orientation Level: Oriented X4    Extremity Assessment RUE Assessment RUE Assessment: Within Functional Limits LUE Assessment LUE Assessment: Within Functional Limits Mobility  Bed Mobility Bed Mobility: No Transfers Transfers: Yes Sit to Stand: 4:  Min assist;With upper extremity assist;From chair/3-in-1    End of Session OT - End of Session Activity Tolerance: Patient tolerated treatment well Patient left: in chair;with call bell in reach;with family/visitor present Nurse Communication: Mobility status for transfers General Behavior During Session: [facility] for tasks performed Cognition: [facility] for tasks performed   Koral Thaden, OTR/L Pager [phone] 03/01/2012, 11:33 AM

## 2012-03-02 LAB — TYPE AND SCREEN
Donor AG Type: NEGATIVE
Donor AG Type: NEGATIVE

## 2012-04-22 ENCOUNTER — Encounter (HOSPITAL_COMMUNITY): Payer: Self-pay | Admitting: *Deleted

## 2012-04-22 ENCOUNTER — Emergency Department (INDEPENDENT_AMBULATORY_CARE_PROVIDER_SITE_OTHER)
Admission: EM | Admit: 2012-04-22 | Discharge: 2012-04-22 | Disposition: A | Discharge status: Nursing Home (Includes ICF) | Payer: Medicare Other | Source: Home / Self Care | Attending: Family Medicine | Admitting: Family Medicine

## 2012-04-22 ENCOUNTER — Emergency Department (HOSPITAL_COMMUNITY)
Admission: EM | Admit: 2012-04-22 | Discharge: 2012-04-22 | Disposition: A | Discharge status: Home / Self Care | Payer: Medicare Other | Attending: Emergency Medicine | Admitting: Emergency Medicine

## 2012-04-22 DIAGNOSIS — I10 Essential (primary) hypertension: Secondary | ICD-10-CM | POA: Insufficient documentation

## 2012-04-22 DIAGNOSIS — I252 Old myocardial infarction: Secondary | ICD-10-CM | POA: Insufficient documentation

## 2012-04-22 DIAGNOSIS — R42 Dizziness and giddiness: Secondary | ICD-10-CM | POA: Insufficient documentation

## 2012-04-22 DIAGNOSIS — Z951 Presence of aortocoronary bypass graft: Secondary | ICD-10-CM | POA: Insufficient documentation

## 2012-04-22 DIAGNOSIS — E7800 Pure hypercholesterolemia, unspecified: Secondary | ICD-10-CM | POA: Insufficient documentation

## 2012-04-22 DIAGNOSIS — R630 Anorexia: Secondary | ICD-10-CM

## 2012-04-22 DIAGNOSIS — I509 Heart failure, unspecified: Secondary | ICD-10-CM | POA: Insufficient documentation

## 2012-04-22 DIAGNOSIS — Z96659 Presence of unspecified artificial knee joint: Secondary | ICD-10-CM | POA: Insufficient documentation

## 2012-04-22 DIAGNOSIS — Z85038 Personal history of other malignant neoplasm of large intestine: Secondary | ICD-10-CM | POA: Insufficient documentation

## 2012-04-22 DIAGNOSIS — I2510 Atherosclerotic heart disease of native coronary artery without angina pectoris: Secondary | ICD-10-CM | POA: Insufficient documentation

## 2012-04-22 DIAGNOSIS — R531 Weakness: Secondary | ICD-10-CM

## 2012-04-22 DIAGNOSIS — R5381 Other malaise: Secondary | ICD-10-CM | POA: Insufficient documentation

## 2012-04-22 DIAGNOSIS — N390 Urinary tract infection, site not specified: Secondary | ICD-10-CM | POA: Insufficient documentation

## 2012-04-22 LAB — DIFFERENTIAL
Eosinophils Absolute: 0.1 10*3/uL (ref 0.0–0.7)
Eosinophils Relative: 1 % (ref 0–5)
Lymphs Abs: 1 10*3/uL (ref 0.7–4.0)
Monocytes Absolute: 0.7 10*3/uL (ref 0.1–1.0)
Monocytes Relative: 6 % (ref 3–12)

## 2012-04-22 LAB — CBC
HCT: 32.5 % — ABNORMAL LOW (ref 39.0–52.0)
Hemoglobin: 11 g/dL — ABNORMAL LOW (ref 13.0–17.0)
MCH: 28.9 pg (ref 26.0–34.0)
MCV: 85.3 fL (ref 78.0–100.0)
Platelets: 277 10*3/uL (ref 150–400)
RBC: 3.81 MIL/uL — ABNORMAL LOW (ref 4.22–5.81)

## 2012-04-22 LAB — URINALYSIS, ROUTINE W REFLEX MICROSCOPIC
Nitrite: NEGATIVE
Specific Gravity, Urine: 1.008 (ref 1.005–1.030)
Urobilinogen, UA: 1 mg/dL (ref 0.0–1.0)
pH: 5.5 (ref 5.0–8.0)

## 2012-04-22 LAB — URINE MICROSCOPIC-ADD ON

## 2012-04-22 LAB — COMPREHENSIVE METABOLIC PANEL
BUN: 24 mg/dL — ABNORMAL HIGH (ref 6–23)
CO2: 29 mEq/L (ref 19–32)
Calcium: 9.5 mg/dL (ref 8.4–10.5)
Creatinine, Ser: 2.18 mg/dL — ABNORMAL HIGH (ref 0.50–1.35)
GFR calc Af Amer: 32 mL/min — ABNORMAL LOW (ref >90)
GFR calc non Af Amer: 28 mL/min — ABNORMAL LOW (ref >90)
Glucose, Bld: 113 mg/dL — ABNORMAL HIGH (ref 70–99)
Total Protein: 6.5 g/dL (ref 6.0–8.3)

## 2012-04-22 MED ORDER — DEXTROSE 5 % IV SOLN
1.0000 g | Freq: Once | INTRAVENOUS | Status: AC
  Administered 2012-04-22: 1 g via INTRAVENOUS
  Filled 2012-04-22: qty 10

## 2012-04-22 MED ORDER — SODIUM CHLORIDE 0.9 % IV BOLUS (SEPSIS)
1000.0000 mL | Freq: Once | INTRAVENOUS | Status: AC
  Administered 2012-04-22: 1000 mL via INTRAVENOUS

## 2012-04-22 MED ORDER — CEPHALEXIN 500 MG PO CAPS: 500.0000 mg | ORAL_CAPSULE | Freq: Four times a day (QID) | ORAL | Status: AC

## 2012-04-22 NOTE — ED Notes (Signed)
Nausea for x 1 week. Voice hoarse and feels dizzy. Knee replacement 7 weeks ago.

## 2012-04-22 NOTE — ED Notes (Signed)
Pt stated he went to doctor and had a UTI, started taking meds on Monday and hasn't felt good since then.

## 2012-04-22 NOTE — ED Notes (Signed)
Pt with c/o nausea/laryngitis/dizziness/no appetite -  onset x one week - seen and treated by dr Jeannetta Nap on  Monday started on levofloxacin for UTI  -urinary symptoms resolved - ate good dinner last night - pt recent knee replacement on 3/4

## 2012-04-22 NOTE — ED Provider Notes (Signed)
History     CSN: [id number]  Arrival date & time 04/22/12  1104   First MD Initiated Contact with Patient 04/22/12 1126      Chief Complaint  Patient presents with  . Nausea  . Dizziness  . Nasal Congestion    (Consider location/radiation/quality/duration/timing/severity/associated sxs/prior treatment) Patient is a 78 y.o. male presenting with abdominal pain.  Abdominal Pain The primary symptoms of the illness include abdominal pain and fever. The current episode started more than 2 days ago. The onset of the illness was gradual. The problem has not changed since onset. The patient has had a change in bowel habit. Risk factors for an acute abdominal problem include being elderly. Additional symptoms associated with the illness include anorexia. Significant associated medical issues include cardiac disease. Associated medical issues comments: s/p colostomy for rectal ca.    Past Medical History  Diagnosis Date  . 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 Pure hypercholesterolemia     takes Vytorin daily  . Personal history of colonic polyps     adenomatous  . Diverticulosis of colon (without mention of hemorrhage)   . Cardiovascular disease   . IHD (ischemic heart disease)   . History of colon cancer   . Cancer     colon  . Hypertension     takes Amlodipine,Metoprolol,and Cardura daily  . Coronary artery disease   . Acute myocardial infarction, unspecified site, episode of care unspecified   . MI (myocardial infarction) 1993  . CHF (congestive heart failure)     takes Lasix daily  . H/O blood clots 1993  . Dysrhythmia   . Shortness of breath     with exertion  . Arthritis   . Joint pain   . Joint swelling   . Edema     to right leg-below knee;not any different than when he saw brackbill in jan 2013  . Bruises easily     takes ASA daily  . Skin cancer     nose  . GERD (gastroesophageal reflux disease)     takes Protonix daily  . Diarrhea   . Constipation   .  Enlarged prostate     takes Finasteride daily  . Blood transfusion 2008  . Insomnia     Past Surgical History  Procedure Date  . Shoulder surgery 2007    left  . Cataract extraction     right  . 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 Coronary angioplasty 05/22/92  . Colostomy 2008  . Colonoscopy   . Coronary artery bypass graft 1993  . Colon surgery 2008    colectomy  . Knee arthroscopy 2010    right   . Total knee arthroplasty 02/28/2012    Procedure: TOTAL KNEE ARTHROPLASTY;  Surgeon: Raymon Mutton, MD;  Location: [facility] OR;  Service: Orthopedics;  Laterality: Right;  . Replacement total knee     Family History  Problem Relation Age of Onset  . Pancreatic cancer Father   . Cancer Father   . Diabetes Son   . Emphysema Mother   . Anesthesia problems Neg Hx   . Hypotension Neg Hx   . Malignant hyperthermia Neg Hx   . Pseudochol deficiency Neg Hx     History  Substance Use Topics  . Smoking status: Former Smoker    Quit date: 05/26/1991  . Smokeless tobacco: Current User  . Alcohol Use: No      Review of Systems  Constitutional: Positive for fever and appetite change.  HENT: Positive for congestion and postnasal drip.   Respiratory: Negative.   Gastrointestinal: Positive for abdominal pain and anorexia. Negative for blood in stool.       Has colostomy   Musculoskeletal: Negative.   Skin: Positive for pallor and wound.    Allergies  Ciprofloxacin; Hctz; Lipitor; Niaspan; Oxycodone-acetaminophen; and Sulfonamide derivatives  Home Medications   Current Outpatient Rx  Name Route Sig Dispense Refill  . AMLODIPINE BESY-BENAZEPRIL HCL 5-20 MG PO CAPS Oral Take 1 capsule by mouth daily.    . ASPIRIN 81 MG PO TABS Oral Take 81 mg by mouth daily.    Marland Kitchen DOXAZOSIN MESYLATE 8 MG PO TABS Oral Take 8 mg by mouth at bedtime.     Marland Kitchen EZETIMIBE-SIMVASTATIN 10-20 MG PO TABS Oral Take 1 tablet by mouth at bedtime.    . FUROSEMIDE 40 MG PO TABS Oral Take 40 mg by mouth  daily.    Marland Kitchen METOPROLOL SUCCINATE ER 50 MG PO TB24 Oral Take 50 mg by mouth daily.    Marland Kitchen PANTOPRAZOLE SODIUM 40 MG PO TBEC Oral Take 40 mg by mouth daily.    Marland Kitchen POTASSIUM CHLORIDE 20 MEQ PO PACK Oral Take 20 mEq by mouth 2 (two) times daily.    Marland Kitchen VITAMIN C PO Oral Take 1 tablet by mouth every evening.    Marland Kitchen CALCIUM CARBONATE-VITAMIN D 600-400 MG-UNIT PO TABS Oral Take 1 tablet by mouth 2 (two) times daily.     Marland Kitchen ENOXAPARIN SODIUM 40 MG/0.4ML [place] SOLN Subcutaneous Inject 0.4 mLs (40 mg total) into the skin daily. 12 Syringe 0  . FINASTERIDE 5 MG PO TABS Oral Take 5 mg by mouth daily.      Marland Kitchen NITROGLYCERIN 0.4 MG SL SUBL Sublingual Place 0.4 mg under the tongue every 5 (five) minutes as needed. For chest pain    . SAW PALMETTO PO Oral Take 1 capsule by mouth every evening.    Marland Kitchen VITAMIN C 500 MG PO TABS Oral Take 500 mg by mouth daily.      . B-100 COMPLEX PO TABS Oral Take 1 tablet by mouth daily.       BP 126/80  Pulse 77  Temp(Src) 97.8 F (36.6 C) (Oral)  Resp 18  SpO2 98%  Physical Exam  Nursing note and vitals reviewed. Constitutional: He is oriented to person, place, and time. He appears well-developed and well-nourished. No distress.  HENT:  Head: Normocephalic.  Right Ear: External ear normal.  Left Ear: External ear normal.  Mouth/Throat: Oropharynx is clear and moist.  Eyes: Pupils are equal, round, and reactive to light.  Neck: Normal range of motion. Neck supple.  Cardiovascular: Normal rate, regular rhythm, normal heart sounds and intact distal pulses.   Pulmonary/Chest: Effort normal and breath sounds normal.  Abdominal: Soft. He exhibits no mass. There is tenderness. There is no rebound and no guarding.       colostomy  Lymphadenopathy:    He has no cervical adenopathy.  Neurological: He is alert and oriented to person, place, and time.  Skin: Skin is warm and dry.  Psychiatric: He has a normal mood and affect.    ED Course  Procedures (including critical care  time)  Labs Reviewed - No data to display No results found.   1. Anorexia       MDM          Linna Hoff, MD 04/22/12 1304

## 2012-04-22 NOTE — ED Provider Notes (Signed)
History     CSN: [id number]  Arrival date & time 04/22/12  1310   First MD Initiated Contact with Patient 04/22/12 1455      Chief Complaint  Patient presents with  . Dizziness     The history is provided by the patient.   the patient reports some nausea for approximately 5-6 days.  He denies abdominal pain.  He reports he feels lightheaded when he stands up and his voice is hoarse.  He denies sore throat.  He denies fevers or chills.  He denies flank pain.  He reports that he had mild left scrotal pain several days ago was started on a floor [place] [facility] Dr.  He reports he no longer has scrotal discomfort or pain.  He denies dysuria but does report some urinary frequency.  He denies abdominal pain at this time.  He reports no chest pain shortness of breath.  His had no productive cough.  He reports that he feels "generally weak".  Past Medical History  Diagnosis Date  . 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 Pure hypercholesterolemia     takes Vytorin daily  . Personal history of colonic polyps     adenomatous  . Diverticulosis of colon (without mention of hemorrhage)   . Cardiovascular disease   . IHD (ischemic heart disease)   . History of colon cancer   . Cancer     colon  . Hypertension     takes Amlodipine,Metoprolol,and Cardura daily  . Coronary artery disease   . Acute myocardial infarction, unspecified site, episode of care unspecified   . MI (myocardial infarction) 1993  . CHF (congestive heart failure)     takes Lasix daily  . H/O blood clots 1993  . Dysrhythmia   . Shortness of breath     with exertion  . Arthritis   . Joint pain   . Joint swelling   . Edema     to right leg-below knee;not any different than when he saw brackbill in jan 2013  . Bruises easily     takes ASA daily  . Skin cancer     nose  . GERD (gastroesophageal reflux disease)     takes Protonix daily  . Diarrhea   . Constipation   . Enlarged prostate     takes Finasteride  daily  . Blood transfusion 2008  . Insomnia     Past Surgical History  Procedure Date  . Shoulder surgery 2007    left  . Cataract extraction     right  . 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 Coronary angioplasty 05/22/92  . Colostomy 2008  . Colonoscopy   . Coronary artery bypass graft 1993  . Colon surgery 2008    colectomy  . Knee arthroscopy 2010    right   . Total knee arthroplasty 02/28/2012    Procedure: TOTAL KNEE ARTHROPLASTY;  Surgeon: Raymon Mutton, MD;  Location: [facility] OR;  Service: Orthopedics;  Laterality: Right;  . Replacement total knee     Family History  Problem Relation Age of Onset  . Pancreatic cancer Father   . Cancer Father   . Diabetes Son   . Emphysema Mother   . Anesthesia problems Neg Hx   . Hypotension Neg Hx   . Malignant hyperthermia Neg Hx   . Pseudochol deficiency Neg Hx     History  Substance Use Topics  . Smoking status: Former Smoker    Quit date: 05/26/1991  .  Smokeless tobacco: Current User  . Alcohol Use: No      Review of Systems  All other systems reviewed and are negative.    Allergies  Ciprofloxacin; Hctz; Lipitor; Niaspan; Oxycodone-acetaminophen; and Sulfonamide derivatives  Home Medications   Current Outpatient Rx  Name Route Sig Dispense Refill  . AMLODIPINE BESY-BENAZEPRIL HCL 5-20 MG PO CAPS Oral Take 1 capsule by mouth daily.    Marland Kitchen VITAMIN C PO Oral Take 1 tablet by mouth every evening.    . ASPIRIN 81 MG PO TABS Oral Take 81 mg by mouth daily.    Marland Kitchen CALCIUM CARBONATE-VITAMIN D 600-400 MG-UNIT PO TABS Oral Take 1 tablet by mouth 2 (two) times daily.     Marland Kitchen DOXAZOSIN MESYLATE 8 MG PO TABS Oral Take 8 mg by mouth at bedtime.     Marland Kitchen EZETIMIBE-SIMVASTATIN 10-20 MG PO TABS Oral Take 1 tablet by mouth at bedtime.    Marland Kitchen FINASTERIDE 5 MG PO TABS Oral Take 5 mg by mouth daily.      . FUROSEMIDE 40 MG PO TABS Oral Take 40 mg by mouth daily.    Marland Kitchen METOPROLOL SUCCINATE ER 50 MG PO TB24 Oral Take 50 mg by mouth  daily.    Marland Kitchen NITROGLYCERIN 0.4 MG SL SUBL Sublingual Place 0.4 mg under the tongue every 5 (five) minutes as needed. For chest pain    . PANTOPRAZOLE SODIUM 40 MG PO TBEC Oral Take 40 mg by mouth daily.    Marland Kitchen POTASSIUM CHLORIDE 20 MEQ PO PACK Oral Take 20 mEq by mouth 2 (two) times daily.    . SAW PALMETTO PO Oral Take 1 capsule by mouth every evening.    Marland Kitchen VITAMIN C 500 MG PO TABS Oral Take 500 mg by mouth daily.      . B-100 COMPLEX PO TABS Oral Take 1 tablet by mouth daily.     . CEPHALEXIN 500 MG PO CAPS Oral Take 1 capsule (500 mg total) by mouth 4 (four) times daily. 28 capsule 0    BP 136/60  Pulse 71  Temp(Src) 97.8 F (36.6 C) (Oral)  Resp 13  SpO2 98%  Physical Exam  Nursing note and vitals reviewed. Constitutional: He is oriented to person, place, and time. He appears well-developed and well-nourished.  HENT:  Head: Normocephalic and atraumatic.       Mucous membranes dry  Eyes: EOM are normal.  Neck: Normal range of motion.  Cardiovascular: Normal rate, regular rhythm, normal heart sounds and intact distal pulses.   Pulmonary/Chest: Effort normal and breath sounds normal. No respiratory distress.  Abdominal: Soft. He exhibits no distension. There is no tenderness.  Musculoskeletal: Normal range of motion.  Neurological: He is alert and oriented to person, place, and time.  Skin: Skin is warm and dry.  Psychiatric: He has a normal mood and affect. Judgment normal.    ED Course  Procedures (including critical care time)  Labs Reviewed  CBC - Abnormal; Notable for the following:    WBC 12.8 (*)    RBC 3.81 (*)    Hemoglobin 11.0 (*)    HCT 32.5 (*)    All other components within normal limits  DIFFERENTIAL - Abnormal; Notable for the following:    Neutrophils Relative 86 (*)    Neutro Abs 11.0 (*)    Lymphocytes Relative 8 (*)    All other components within normal limits  COMPREHENSIVE METABOLIC PANEL - Abnormal; Notable for the following:    Sodium 133 (*)  Chloride 94 (*)    Glucose, Bld 113 (*)    BUN 24 (*)    Creatinine, Ser 2.18 (*)    Albumin 3.0 (*)    GFR calc non Af Amer 28 (*)    GFR calc Af Amer 32 (*)    All other components within normal limits  URINALYSIS, ROUTINE W REFLEX MICROSCOPIC - Abnormal; Notable for the following:    APPearance CLOUDY (*)    Hgb urine dipstick MODERATE (*)    Leukocytes, UA LARGE (*)    All other components within normal limits  URINE MICROSCOPIC-ADD ON  URINE CULTURE   No results found.   1. Weakness   2. Urinary tract infection       MDM  The patient generalized weakness.  He feels much better after IV fluids.  His urine has large leukocytes and rare bacteria.  This is concerning for urinary tract infection.  Patient will be stopped on his current antibiotics and he'll be switched to Keflex.  There is some resistance in urine in this community to fluorquinolones.         Lyanne Co, MD 04/22/12 [phone]

## 2012-04-22 NOTE — ED Notes (Signed)
Report nicky rn 1st nurse [facility] ed - to ed via shuttle with wife  Pt in wheelchair

## 2012-04-22 NOTE — Discharge Instructions (Signed)

## 2012-04-23 LAB — URINE CULTURE: Culture  Setup Time: 201304272031

## 2012-05-05 ENCOUNTER — Encounter: Payer: Self-pay | Admitting: Cardiology

## 2012-05-05 ENCOUNTER — Telehealth: Payer: Self-pay | Admitting: *Deleted

## 2012-05-05 ENCOUNTER — Ambulatory Visit (INDEPENDENT_AMBULATORY_CARE_PROVIDER_SITE_OTHER): Payer: Medicare Other | Admitting: Cardiology

## 2012-05-05 VITALS — BP 136/84 | HR 80 | Ht 70.0 in | Wt 207.0 lb

## 2012-05-05 DIAGNOSIS — E785 Hyperlipidemia, unspecified: Secondary | ICD-10-CM

## 2012-05-05 DIAGNOSIS — R42 Dizziness and giddiness: Secondary | ICD-10-CM

## 2012-05-05 DIAGNOSIS — I119 Hypertensive heart disease without heart failure: Secondary | ICD-10-CM

## 2012-05-05 DIAGNOSIS — I10 Essential (primary) hypertension: Secondary | ICD-10-CM

## 2012-05-05 DIAGNOSIS — N289 Disorder of kidney and ureter, unspecified: Secondary | ICD-10-CM

## 2012-05-05 DIAGNOSIS — Z951 Presence of aortocoronary bypass graft: Secondary | ICD-10-CM

## 2012-05-05 DIAGNOSIS — N390 Urinary tract infection, site not specified: Secondary | ICD-10-CM

## 2012-05-05 LAB — BASIC METABOLIC PANEL
BUN: 17 mg/dL (ref 6–23)
Calcium: 10 mg/dL (ref 8.4–10.5)
GFR: 48.74 mL/min — ABNORMAL LOW (ref >60.00)
Glucose, Bld: 107 mg/dL — ABNORMAL HIGH (ref 70–99)
Potassium: 4.6 mEq/L (ref 3.5–5.1)

## 2012-05-05 LAB — CBC WITH DIFFERENTIAL/PLATELET
Basophils Absolute: 0 10*3/uL (ref 0.0–0.1)
Eosinophils Relative: 1.9 % (ref 0.0–5.0)
Lymphocytes Relative: 9.7 % — ABNORMAL LOW (ref 12.0–46.0)
Lymphs Abs: 0.8 10*3/uL (ref 0.7–4.0)
Monocytes Relative: 7 % (ref 3.0–12.0)
Neutrophils Relative %: 80.9 % — ABNORMAL HIGH (ref 43.0–77.0)
Platelets: 229 10*3/uL (ref 150.0–400.0)
RDW: 15.4 % — ABNORMAL HIGH (ref 11.5–14.6)
WBC: 7.8 10*3/uL (ref 4.5–10.5)

## 2012-05-05 LAB — HEPATIC FUNCTION PANEL
ALT: 18 U/L (ref 0–53)
AST: 22 U/L (ref 0–37)
Bilirubin, Direct: 0.1 mg/dL (ref 0.0–0.3)
Total Bilirubin: 0.7 mg/dL (ref 0.3–1.2)
Total Protein: 6.4 g/dL (ref 6.0–8.3)

## 2012-05-05 LAB — LIPID PANEL
Cholesterol: 113 mg/dL (ref 0–200)
HDL: 51.5 mg/dL (ref >39.00)
VLDL: 13.2 mg/dL (ref 0.0–40.0)

## 2012-05-05 MED ORDER — AMOXICILLIN 250 MG PO CAPS: 250.0000 mg | ORAL_CAPSULE | Freq: Three times a day (TID) | ORAL | Status: AC

## 2012-05-05 MED ORDER — FUROSEMIDE 20 MG PO TABS: 20.0000 mg | ORAL_TABLET | Freq: Every day | ORAL | Status: DC

## 2012-05-05 NOTE — Progress Notes (Signed)
Nicholas [name] Date of Birth:  August 28, 1934 [facility] [address] [place], [place]  [address] [phone]         Fax   [phone]  History of Present Illness: This pleasant gentleman is seen for a scheduled followup office visit.  He has a past history of ischemic heart disease and had coronary artery bypass graft surgery on 05/27/92.  He has a past history of compensated congestive heart failure.  He had a nuclear stress test in June 2010 showing an old inferior scar but no reversible ischemia and his ejection fraction was 51%.  He underwent successful right knee replacement on March 4.  He is doing well from the standpoint of his knee.  He has had a urinary tract infection postoperatively.  He initially was given Levaquin and then cephalexin neither of which he tolerated.  He stated they were both too strong for his system.  The patient had a recent episode of dehydration for which she required IV fluids in the emergency room.  He has lost 17 pounds since we last saw him.  Current Outpatient Prescriptions  Medication Sig Dispense Refill  . amLODipine-benazepril (LOTREL) 5-20 MG per capsule Take 1 capsule by mouth daily.      . Ascorbic Acid (VITAMIN C PO) Take 1 tablet by mouth every evening.      Marland Kitchen aspirin 81 MG tablet Take 81 mg by mouth daily.      . Calcium Carbonate-Vitamin D (CALCIUM 600-D) 600-400 MG-UNIT per tablet Take 1 tablet by mouth 2 (two) times daily.       Marland Kitchen doxazosin (CARDURA) 8 MG tablet Take 8 mg by mouth at bedtime.       Marland Kitchen ezetimibe-simvastatin (VYTORIN) 10-20 MG per tablet Take 1 tablet by mouth at bedtime.      . finasteride (PROSCAR) 5 MG tablet Take 5 mg by mouth daily.        . metoprolol succinate (TOPROL-XL) 50 MG 24 hr tablet Take 50 mg by mouth daily.      . nitroGLYCERIN (NITROSTAT) 0.4 MG SL tablet Place 0.4 mg under the tongue every 5 (five) minutes as needed. For chest pain      . pantoprazole (PROTONIX) 40 MG tablet Take 40 mg by  mouth daily.      . potassium chloride (KLOR-CON) 20 MEQ packet Take 20 mEq by mouth daily.       . Saw Palmetto, Serenoa repens, (SAW PALMETTO PO) Take 1 capsule by mouth every evening.      . vitamin C (ASCORBIC ACID) 500 MG tablet Take 500 mg by mouth daily.        . Vitamins-Lipotropics (B-100 COMPLEX) TABS Take 1 tablet by mouth daily.       Marland Kitchen DISCONTD: furosemide (LASIX) 40 MG tablet Take 40 mg by mouth. 1/2 tablet daily      . amoxicillin (AMOXIL) 250 MG capsule Take 1 capsule (250 mg total) by mouth 3 (three) times daily.  15 capsule  0  . furosemide (LASIX) 20 MG tablet Take 1 tablet (20 mg total) by mouth daily.  30 tablet  11    Allergies  Allergen Reactions  . Cephalexin   . Ciprofloxacin Other (See Comments)    Reaction unknown  . Hctz (Hydrochlorothiazide)     DIZZINESS   . Levofloxacin   . Lipitor (Atorvastatin Calcium)     MUSCLE PAIN  . Niaspan (Niacin Er (Antihyperlipidemic))     FLUSHING   .  Oxycodone-Acetaminophen Other (See Comments)    Reaction unknown  . Sulfonamide Derivatives Other (See Comments)    Reaction unkonwn    Patient Active Problem List  Diagnoses  . CARCINOMA, RECTUM  . HYPERCHOLESTEROLEMIA  . ANEMIA, IRON DEFICIENCY  . HYPERTENSION  . MYOCARDIAL INFARCTION  . GERD  . DIVERTICULOSIS, COLON  . DEGENERATIVE JOINT DISEASE, GENERALIZED  . COLONIC POLYPS, ADENOMATOUS, HX OF  . Personal history of malignant neoplasm of rectum, rectosigmoid junction, and anus  . GERD (gastroesophageal reflux disease)  . Hernia of abdominal cavity  . Hx of CABG  . Asymptomatic PVCs  . BPH (benign prostatic hyperplasia)  . Renal insufficiency    History  Smoking status  . Former Smoker  . Quit date: 05/26/1991  Smokeless tobacco  . Current User    History  Alcohol Use No    Family History  Problem Relation Age of Onset  . Pancreatic cancer Father   . Cancer Father   . Diabetes Son   . Emphysema Mother   . Anesthesia problems Neg Hx   .  Hypotension Neg Hx   . Malignant hyperthermia Neg Hx   . Pseudochol deficiency Neg Hx     Review of Systems: Constitutional: no fever chills diaphoresis or fatigue or change in weight.  Head and neck: no hearing loss, no epistaxis, no photophobia or visual disturbance. Respiratory: No cough, shortness of breath or wheezing. Cardiovascular: No chest pain peripheral edema, palpitations. Gastrointestinal: No abdominal distention, no abdominal pain, no change in bowel habits hematochezia or melena. Genitourinary: No dysuria, no frequency, no urgency, no nocturia. Musculoskeletal:No arthralgias, no back pain, no gait disturbance or myalgias. Neurological: No dizziness, no headaches, no numbness, no seizures, no syncope, no weakness, no tremors. Hematologic: No lymphadenopathy, no easy bruising. Psychiatric: No confusion, no hallucinations, no sleep disturbance.    Physical Exam: Filed Vitals:   05/05/12 0856  BP: 136/84  Pulse: 80   the general appearance reveals a well-developed well-nourished gentleman in no distress.Pupils equal and reactive.   Extraocular Movements are full.  There is no scleral icterus.  The mouth and pharynx are normal.  The neck is supple.  The carotids reveal no bruits.  The jugular venous pressure is normal.  The thyroid is not enlarged.  There is no lymphadenopathy.  The chest is clear to percussion and auscultation. There are no rales or rhonchi. Expansion of the chest is symmetrical.  The precordium is quiet.  The first heart sound is normal.  The second heart sound is physiologically split.  There is no murmur gallop rub or click.  There is no abnormal lift or heave.  Abdomen reveals a colostomy. Extremities reveal no phlebitis or edema.  His right knee incision is healing nicely   Assessment / Plan: Continue same medication except for reduction in Lasix dose.  Short-term therapy with amoxicillin for his urinary tract infection until he can get into his  urologist office.  Recheck here in 4 months for followup office visit EKG CBC and fasting lab work

## 2012-05-05 NOTE — Assessment & Plan Note (Signed)
The patient has had no recurrence of angina pectoris. 

## 2012-05-05 NOTE — Patient Instructions (Signed)
Decrease your Lasix to 1/2 daily (20 mg)  Will obtain labs today and call you with the results   Your physician recommends that you schedule a follow-up appointment in: 4 months with fasting labs (lp/bmet/hfp/cbc) and EKG

## 2012-05-05 NOTE — Assessment & Plan Note (Signed)
The patient has a history of renal insufficiency.  He had a recent episode of dehydration requiring IV fluids.  An episode was associated with a marked increase in his serum creatinine temporarily.  He is still having symptoms of a urinary tract infection and his renal insufficiency we'll not allow [place] to use Macrodantin.  He is able to take amoxicillin and we will call in a low-dose of amoxicillin 250 mg 3 times a day for 5 days and he is awaiting an appointment with his urologist in the near future

## 2012-05-05 NOTE — Telephone Encounter (Signed)
Rx amoxil for UTI per  Dr. Patty Sermons, he decided after reviewing labs from office visit today.  Advised patient

## 2012-05-05 NOTE — Assessment & Plan Note (Signed)
Blood pressure has been stable on current therapy.  However he has been having a lot of lightheadedness and dizziness.  He is not having any symptoms of volume overload at this point and his weight is down 17 pounds and we are going to decrease his furosemide to 20 mg daily

## 2012-05-06 NOTE — Progress Notes (Signed)
Quick Note:  Please report to patient. The recent labs are stable. Continue same medication and careful diet. Lipids are good. There is mild anemia. Kidney function has improved. Liver okay. ______

## 2012-05-09 ENCOUNTER — Telehealth: Payer: Self-pay | Admitting: *Deleted

## 2012-05-09 NOTE — Telephone Encounter (Signed)
Message copied by Burnell Blanks on Tue May 09, 2012  9:21 AM ------      Message from: [name]      Created: Sat May 06, 2012  5:33 PM       Please report to patient.  The recent labs are stable. Continue same medication and careful diet. Lipids are good. There is mild anemia. Kidney function has improved. Liver okay.

## 2012-05-09 NOTE — Telephone Encounter (Signed)
Advised of labs 

## 2012-05-30 ENCOUNTER — Other Ambulatory Visit: Payer: Self-pay | Admitting: *Deleted

## 2012-05-30 MED ORDER — PANTOPRAZOLE SODIUM 40 MG PO TBEC: DELAYED_RELEASE_TABLET | ORAL | Status: DC

## 2012-05-30 NOTE — Telephone Encounter (Signed)
Your prescription(s) have been sent to you pharmacy, per last office visit Dr Jarold Motto wants pt to have a colonoscopy, so pt will need at least an office visit to discuss.

## 2012-06-05 ENCOUNTER — Encounter: Payer: Self-pay | Admitting: Gastroenterology

## 2012-06-05 NOTE — Telephone Encounter (Signed)
Error

## 2012-06-13 ENCOUNTER — Telehealth: Payer: Self-pay | Admitting: Hematology and Oncology

## 2012-06-13 NOTE — Telephone Encounter (Signed)
Due to LO out of office 10/25 f/u moved to 10/24. S/w pt today he is aware. Other appts remain the same.

## 2012-07-04 ENCOUNTER — Other Ambulatory Visit (INDEPENDENT_AMBULATORY_CARE_PROVIDER_SITE_OTHER): Payer: Medicare Other

## 2012-07-04 ENCOUNTER — Encounter: Payer: Self-pay | Admitting: Gastroenterology

## 2012-07-04 ENCOUNTER — Ambulatory Visit (INDEPENDENT_AMBULATORY_CARE_PROVIDER_SITE_OTHER): Payer: Medicare Other | Admitting: Gastroenterology

## 2012-07-04 VITALS — BP 160/70 | HR 80 | Ht 70.0 in | Wt 220.4 lb

## 2012-07-04 DIAGNOSIS — D509 Iron deficiency anemia, unspecified: Secondary | ICD-10-CM

## 2012-07-04 DIAGNOSIS — R6889 Other general symptoms and signs: Secondary | ICD-10-CM

## 2012-07-04 DIAGNOSIS — K219 Gastro-esophageal reflux disease without esophagitis: Secondary | ICD-10-CM

## 2012-07-04 DIAGNOSIS — Z85038 Personal history of other malignant neoplasm of large intestine: Secondary | ICD-10-CM

## 2012-07-04 LAB — IBC PANEL
Iron: 80 ug/dL (ref 42–165)
Transferrin: 222.7 mg/dL (ref 212.0–360.0)

## 2012-07-04 LAB — CBC WITH DIFFERENTIAL/PLATELET
Basophils Relative: 0.6 % (ref 0.0–3.0)
Eosinophils Absolute: 0.2 10*3/uL (ref 0.0–0.7)
Eosinophils Relative: 2.7 % (ref 0.0–5.0)
Hemoglobin: 11.1 g/dL — ABNORMAL LOW (ref 13.0–17.0)
Lymphocytes Relative: 11.5 % — ABNORMAL LOW (ref 12.0–46.0)
MCHC: 32.5 g/dL (ref 30.0–36.0)
MCV: 87.4 fl (ref 78.0–100.0)
Monocytes Absolute: 0.5 10*3/uL (ref 0.1–1.0)
Neutro Abs: 5.4 10*3/uL (ref 1.4–7.7)
Neutrophils Relative %: 77.4 % — ABNORMAL HIGH (ref 43.0–77.0)
RBC: 3.91 Mil/uL — ABNORMAL LOW (ref 4.22–5.81)
WBC: 7 10*3/uL (ref 4.5–10.5)

## 2012-07-04 LAB — FOLATE: Folate: 20 ng/mL (ref >5.9)

## 2012-07-04 MED ORDER — PANTOPRAZOLE SODIUM 40 MG PO TBEC: DELAYED_RELEASE_TABLET | ORAL | Status: DC

## 2012-07-04 NOTE — Patient Instructions (Addendum)
Your physician has requested that you go to the basement for the following lab work before leaving today:IBC panel, Ferritin, Vitamin B12, Folic acid, CBC. We have sent the following medications to your pharmacy for you to pick up at your convenience:Protonix. cc: Windle Guard, MD

## 2012-07-04 NOTE — Progress Notes (Signed)
This is a 78 year old Caucasian male with multiple cardiovascular issues followed by Dr. [name]. His had rectal carcinoma and has undergone left colon resection with permanent colostomy which is well-functioning. He had colonoscopy one year ago that was otherwise unremarkable. He did have a history of iron deficiency, and has been in the past on oral iron replacement therapy. He denies any GI issues at this time except for a incisional hernia which is easily reducible. Specifically, he denies melena, bleeding from his colostomy, dyspepsia, upper GI or hepatobiliary complaints. He is also followed by medical oncology, but is not on chemotherapy at this time. His appetite is good and his weight is stable. He is on Protonix 40 mg a day for acid reflux symptoms and because he is on daily aspirin. He is status post recent right knee replacement by Dr.Lucy.  Current Medications, Allergies, Past Medical History, Past Surgical History, Family History and Social History were reviewed in [employer] record.  Pertinent Review of Systems Negative... chronic shortness of breath with exertion, but he denies palpitations, or angina. Patient does have multiple drug allergies.   Physical Exam: Healthy patient appears stated age blood pressure 160/70, pulse 80 and regular, weight 220 pounds with BMI 31.62. I cannot appreciate stigmata of chronic liver disease. His chest is clear and he appears to be in a regular rhythm without murmurs gallops or rubs. I cannot appreciate hepatosplenomegaly, abdominal masses or tenderness. He has a normal-appearing colostomy site in his left midabdomen with an associated peristomal epigastric hernia which is easier reducible. There is no tenderness, and bowel sounds are nonobstructive. There is +1 peripheral edema of his lower extremities, right leg greater than left. I cannot appreciate any changes of phlebitis or cellulitis. Mental status is  normal.    Assessment and Plan: I see no need for repeat endoscopy or colonoscopy at this time. He is on every three-year colonoscopy protocol. I have renewed his PPI therapy, and we will check followup CBC and anemia profile. He is to continue all of his other medications as per his multiple physicians. Please copy Dr. [name] and Dr. Dalene Carrow in oncology. Encounter Diagnoses  Name Primary?  . Personal history of colon cancer Yes  . GERD (gastroesophageal reflux disease)   . General symptoms(aka GENERAL SYMPTOMS)    . Other general symptoms    . Iron deficiency anemia, unspecified    . Iron deficiency anemia, unspecified

## 2012-09-01 ENCOUNTER — Other Ambulatory Visit (INDEPENDENT_AMBULATORY_CARE_PROVIDER_SITE_OTHER): Payer: Medicare Other

## 2012-09-01 ENCOUNTER — Telehealth: Payer: Self-pay | Admitting: *Deleted

## 2012-09-01 ENCOUNTER — Encounter: Payer: Self-pay | Admitting: Cardiology

## 2012-09-01 ENCOUNTER — Ambulatory Visit (INDEPENDENT_AMBULATORY_CARE_PROVIDER_SITE_OTHER): Payer: Medicare Other | Admitting: Cardiology

## 2012-09-01 VITALS — BP 128/70 | HR 60 | Ht 70.0 in | Wt 220.0 lb

## 2012-09-01 DIAGNOSIS — I119 Hypertensive heart disease without heart failure: Secondary | ICD-10-CM

## 2012-09-01 DIAGNOSIS — I493 Ventricular premature depolarization: Secondary | ICD-10-CM

## 2012-09-01 DIAGNOSIS — I10 Essential (primary) hypertension: Secondary | ICD-10-CM

## 2012-09-01 DIAGNOSIS — I4949 Other premature depolarization: Secondary | ICD-10-CM

## 2012-09-01 DIAGNOSIS — Z951 Presence of aortocoronary bypass graft: Secondary | ICD-10-CM

## 2012-09-01 DIAGNOSIS — E7800 Pure hypercholesterolemia, unspecified: Secondary | ICD-10-CM

## 2012-09-01 LAB — CBC WITH DIFFERENTIAL/PLATELET
Basophils Absolute: 0 10*3/uL (ref 0.0–0.1)
Basophils Relative: 0.4 % (ref 0.0–3.0)
Eosinophils Absolute: 0.2 10*3/uL (ref 0.0–0.7)
Hemoglobin: 11 g/dL — ABNORMAL LOW (ref 13.0–17.0)
Lymphocytes Relative: 10.9 % — ABNORMAL LOW (ref 12.0–46.0)
MCHC: 32 g/dL (ref 30.0–36.0)
MCV: 88.5 fl (ref 78.0–100.0)
Monocytes Absolute: 0.5 10*3/uL (ref 0.1–1.0)
Neutro Abs: 6.3 10*3/uL (ref 1.4–7.7)
Neutrophils Relative %: 79.1 % — ABNORMAL HIGH (ref 43.0–77.0)
RBC: 3.9 Mil/uL — ABNORMAL LOW (ref 4.22–5.81)
RDW: 17.8 % — ABNORMAL HIGH (ref 11.5–14.6)

## 2012-09-01 LAB — HEPATIC FUNCTION PANEL
AST: 22 U/L (ref 0–37)
Albumin: 3.9 g/dL (ref 3.5–5.2)
Alkaline Phosphatase: 60 U/L (ref 39–117)
Total Bilirubin: 0.3 mg/dL (ref 0.3–1.2)

## 2012-09-01 LAB — LIPID PANEL
LDL Cholesterol: 57 mg/dL (ref 0–99)
Total CHOL/HDL Ratio: 2

## 2012-09-01 LAB — BASIC METABOLIC PANEL
CO2: 29 mEq/L (ref 19–32)
Calcium: 10 mg/dL (ref 8.4–10.5)
Chloride: 106 mEq/L (ref 96–112)
Creatinine, Ser: 1.7 mg/dL — ABNORMAL HIGH (ref 0.4–1.5)
Glucose, Bld: 101 mg/dL — ABNORMAL HIGH (ref 70–99)

## 2012-09-01 MED ORDER — FUROSEMIDE 20 MG PO TABS: 20.0000 mg | ORAL_TABLET | Freq: Two times a day (BID) | ORAL | Status: DC | PRN

## 2012-09-01 NOTE — Assessment & Plan Note (Signed)
Blood pressure has been stable on current therapy.  Generally he takes furosemide 20 mg a day but if his peripheral edema increases he takes an extra dose.  We will adjust his prescriptions so that records that he may take it twice a day if necessary

## 2012-09-01 NOTE — Patient Instructions (Addendum)
Increase your Lasix to 20 mg twice a day as needed for fluid, Rx sent to [facility]   Your physician wants you to follow-up in: 4 months with fasting labs (lp/bmet/hfp)  You will receive a reminder letter in the mail two months in advance. If you don't receive a letter, please call our office to schedule the follow-up appointment.   Will obtain labs today and call you with the results

## 2012-09-01 NOTE — Progress Notes (Signed)
Nicholas [name] Date of Birth:  [birth year] [facility] [address] [place], [place]  [address] [phone]         Fax   [phone]  History of Present Illness: This pleasant gentleman is seen for a scheduled followup office visit. He has a past history of ischemic heart disease and had coronary artery bypass graft surgery on 05/27/92. He has a past history of compensated congestive heart failure. He had a nuclear stress test in June 2010 showing an old inferior scar but no reversible ischemia and his ejection fraction was 51%. He underwent successful right knee replacement on February 28, 2012. He is doing well from the standpoint of his knee.  He had a recent checkup with his orthopedist who was pleased with the surgical result encourage the patient to use an exercise bicycle.    Current Outpatient Prescriptions  Medication Sig Dispense Refill  . amLODipine-benazepril (LOTREL) 5-20 MG per capsule Take 1 capsule by mouth daily.      . Ascorbic Acid (VITAMIN C PO) Take 1 tablet by mouth every evening.      Marland Kitchen aspirin 81 MG tablet Take 81 mg by mouth daily.      . Calcium Carbonate-Vitamin D (CALCIUM 600-D) 600-400 MG-UNIT per tablet Take 1 tablet by mouth 2 (two) times daily.       Marland Kitchen doxazosin (CARDURA) 8 MG tablet Take 8 mg by mouth at bedtime.       Marland Kitchen ezetimibe-simvastatin (VYTORIN) 10-20 MG per tablet Take 1 tablet by mouth at bedtime.      . finasteride (PROSCAR) 5 MG tablet Take 5 mg by mouth daily.        . furosemide (LASIX) 20 MG tablet Take 1 tablet (20 mg total) by mouth 2 (two) times daily as needed.  60 tablet  11  . metoprolol succinate (TOPROL-XL) 50 MG 24 hr tablet Take 50 mg by mouth daily.      . nitroGLYCERIN (NITROSTAT) 0.4 MG SL tablet Place 0.4 mg under the tongue every 5 (five) minutes as needed. For chest pain      . pantoprazole (PROTONIX) 40 MG tablet Take one tablet by mouth once a day  30 tablet  11  . potassium chloride (KLOR-CON) 20 MEQ  packet Take 20 mEq by mouth daily.       . Saw Palmetto, Serenoa repens, (SAW PALMETTO PO) Take 1 capsule by mouth every evening.      . vitamin C (ASCORBIC ACID) 500 MG tablet Take 500 mg by mouth daily.        . Vitamins-Lipotropics (B-100 COMPLEX) TABS Take 1 tablet by mouth daily.       Marland Kitchen DISCONTD: furosemide (LASIX) 20 MG tablet Take 1 tablet (20 mg total) by mouth daily.  30 tablet  11    Allergies  Allergen Reactions  . Cephalexin   . Ciprofloxacin Other (See Comments)    Reaction unknown  . Hctz (Hydrochlorothiazide)     DIZZINESS   . Levofloxacin   . Lipitor (Atorvastatin Calcium)     MUSCLE PAIN  . Niaspan (Niacin Er)     FLUSHING   . Oxycodone-Acetaminophen Other (See Comments)    Reaction unknown  . Sulfonamide Derivatives Other (See Comments)    Reaction unkonwn    Patient Active Problem List  Diagnosis  . CARCINOMA, RECTUM  . HYPERCHOLESTEROLEMIA  . ANEMIA, IRON DEFICIENCY  . HYPERTENSION  . MYOCARDIAL INFARCTION  . GERD  . DIVERTICULOSIS, COLON  .  DEGENERATIVE JOINT DISEASE, GENERALIZED  . COLONIC POLYPS, ADENOMATOUS, HX OF  . Personal history of malignant neoplasm of rectum, rectosigmoid junction, and anus  . GERD (gastroesophageal reflux disease)  . Hernia of abdominal cavity  . Hx of CABG  . Asymptomatic PVCs  . BPH (benign prostatic hyperplasia)  . Renal insufficiency    History  Smoking status  . Former Smoker  . Quit date: 05/26/1991  Smokeless tobacco  . Current User    History  Alcohol Use No    Family History  Problem Relation Age of Onset  . Pancreatic cancer Father   . Diabetes Son   . Emphysema Mother   . Anesthesia problems Neg Hx   . Hypotension Neg Hx   . Malignant hyperthermia Neg Hx   . Pseudochol deficiency Neg Hx     Review of Systems: Constitutional: no fever chills diaphoresis or fatigue or change in weight.  Head and neck: no hearing loss, no epistaxis, no photophobia or visual disturbance. Respiratory: No  cough, shortness of breath or wheezing. Cardiovascular: No chest pain peripheral edema, palpitations. Gastrointestinal: No abdominal distention, no abdominal pain, no change in bowel habits hematochezia or melena. Genitourinary: No dysuria, no frequency, no urgency, no nocturia. Musculoskeletal:No arthralgias, no back pain, no gait disturbance or myalgias. Neurological: No dizziness, no headaches, no numbness, no seizures, no syncope, no weakness, no tremors. Hematologic: No lymphadenopathy, no easy bruising. Psychiatric: No confusion, no hallucinations, no sleep disturbance.    Physical Exam: Filed Vitals:   09/01/12 0903  BP: 128/70  Pulse: 60   the general appearance reveals a well-developed well-nourished elderly gentleman in no distress.  His weight is back up 13 pounds since last visit.  On his last visit he was dehydrated.The head and neck exam reveals pupils equal and reactive.  Extraocular movements are full.  There is no scleral icterus.  The mouth and pharynx are normal.  The neck is supple.  The carotids reveal no bruits.  The jugular venous pressure is normal.  The  thyroid is not enlarged.  There is no lymphadenopathy.  The chest is clear to percussion and auscultation.  There are no rales or rhonchi.  Expansion of the chest is symmetrical.  The precordium is quiet.  The first heart sound is normal.  The second heart sound is physiologically split.  There is no murmur gallop rub or click.  There is no abnormal lift or heave.  The abdomen is soft and nontender.  The bowel sounds are normal.  The liver and spleen are not enlarged.  There are no abdominal masses.  There are no abdominal bruits.  Extremities reveal good pedal pulses.  There is no phlebitis and there is mild to moderate bilateral pretibial edema. There is no cyanosis or clubbing.  Strength is normal and symmetrical in all extremities.  There is no lateralizing weakness.  There are no sensory deficits.  The skin is warm and  dry.  There is no rash.  EKG today shows normal sinus rhythm with PR interval of 234 ms and occasional PVCs.  No ischemic changes  Assessment / Plan: Continue same medication.  Lab work pending.  We will increase his furosemide to twice a day as necessary to control edema to be rechecked in 4 months for followup office visit lipid panel hepatic function panel and basal metabolic panel

## 2012-09-01 NOTE — Addendum Note (Signed)
Addended by: Regis Bill B on: 09/01/2012 10:12 AM   Modules accepted: Orders

## 2012-09-01 NOTE — Assessment & Plan Note (Signed)
The patient has a history of chronic asymptomatic PVCs.  These have decreased on examination today.  The patient is not having any dizzy spells

## 2012-09-01 NOTE — Assessment & Plan Note (Signed)
The patient has not been expressing any chest pain or angina pectoris. 

## 2012-09-01 NOTE — Progress Notes (Signed)
Quick Note:  Please report to patient. The recent labs are stable. Continue same medication and careful diet. ______ 

## 2012-09-01 NOTE — Telephone Encounter (Signed)
Message copied by Burnell Blanks on Fri Sep 01, 2012  5:47 PM ------      Message from: [name]      Created: Fri Sep 01, 2012  5:12 PM       Please report to patient.  The recent labs are stable. Continue same medication and careful diet.

## 2012-09-01 NOTE — Telephone Encounter (Signed)
Advised patient of lab results  

## 2012-10-12 ENCOUNTER — Other Ambulatory Visit: Payer: Self-pay | Admitting: *Deleted

## 2012-10-12 DIAGNOSIS — C20 Malignant neoplasm of rectum: Secondary | ICD-10-CM

## 2012-10-13 ENCOUNTER — Other Ambulatory Visit (HOSPITAL_BASED_OUTPATIENT_CLINIC_OR_DEPARTMENT_OTHER): Payer: Medicare Other | Admitting: Lab

## 2012-10-13 DIAGNOSIS — C20 Malignant neoplasm of rectum: Secondary | ICD-10-CM

## 2012-10-13 LAB — CBC WITH DIFFERENTIAL/PLATELET
Basophils Absolute: 0 10*3/uL (ref 0.0–0.1)
Eosinophils Absolute: 0.2 10*3/uL (ref 0.0–0.5)
HCT: 36.5 % — ABNORMAL LOW (ref 38.4–49.9)
HGB: 11.8 g/dL — ABNORMAL LOW (ref 13.0–17.1)
MONO#: 0.7 10*3/uL (ref 0.1–0.9)
NEUT%: 74.4 % (ref 39.0–75.0)
WBC: 6.5 10*3/uL (ref 4.0–10.3)
lymph#: 0.8 10*3/uL — ABNORMAL LOW (ref 0.9–3.3)

## 2012-10-13 LAB — COMPREHENSIVE METABOLIC PANEL (CC13)
BUN: 21 mg/dL (ref 7.0–26.0)
CO2: 26 mEq/L (ref 22–29)
Calcium: 10.1 mg/dL (ref 8.4–10.4)
Chloride: 105 mEq/L (ref 98–107)
Creatinine: 1.7 mg/dL — ABNORMAL HIGH (ref 0.7–1.3)

## 2012-10-13 LAB — CEA: CEA: 0.8 ng/mL (ref 0.0–5.0)

## 2012-10-13 NOTE — Progress Notes (Signed)
Reviewed creatine level  done today at 1.7 with Norina Buzzard NP with Dr. Garvin Fila. Pt. For a ct chest /abd/pelvis on 1010-21-13 with contrast.  Ms. Durene Cal called [facility] and the technician stated that the IV contrast dose will be adjusted according to renal function.

## 2012-10-16 ENCOUNTER — Ambulatory Visit (HOSPITAL_COMMUNITY)
Admission: RE | Admit: 2012-10-16 | Discharge: 2012-10-16 | Disposition: A | Discharge status: Home / Self Care | Payer: Medicare Other | Source: Ambulatory Visit | Attending: Hematology and Oncology | Admitting: Hematology and Oncology

## 2012-10-16 DIAGNOSIS — C20 Malignant neoplasm of rectum: Secondary | ICD-10-CM | POA: Insufficient documentation

## 2012-10-16 DIAGNOSIS — C19 Malignant neoplasm of rectosigmoid junction: Secondary | ICD-10-CM

## 2012-10-16 DIAGNOSIS — Q619 Cystic kidney disease, unspecified: Secondary | ICD-10-CM | POA: Insufficient documentation

## 2012-10-16 DIAGNOSIS — K439 Ventral hernia without obstruction or gangrene: Secondary | ICD-10-CM | POA: Insufficient documentation

## 2012-10-16 DIAGNOSIS — Z433 Encounter for attention to colostomy: Secondary | ICD-10-CM | POA: Insufficient documentation

## 2012-10-16 MED ORDER — IOHEXOL 300 MG/ML  SOLN
100.0000 mL | Freq: Once | INTRAMUSCULAR | Status: AC | PRN
  Administered 2012-10-16: 100 mL via INTRAVENOUS

## 2012-10-18 ENCOUNTER — Other Ambulatory Visit: Payer: Self-pay | Admitting: *Deleted

## 2012-10-18 MED ORDER — POTASSIUM CHLORIDE 20 MEQ PO PACK: 20.0000 meq | PACK | Freq: Every day | ORAL | Status: DC

## 2012-10-19 ENCOUNTER — Ambulatory Visit (HOSPITAL_BASED_OUTPATIENT_CLINIC_OR_DEPARTMENT_OTHER): Payer: Medicare Other | Admitting: Hematology and Oncology

## 2012-10-19 ENCOUNTER — Encounter: Payer: Self-pay | Admitting: Hematology and Oncology

## 2012-10-19 ENCOUNTER — Telehealth: Payer: Self-pay | Admitting: Hematology and Oncology

## 2012-10-19 VITALS — BP 156/56 | HR 59 | Temp 96.9°F | Resp 20 | Ht 70.0 in | Wt 222.4 lb

## 2012-10-19 DIAGNOSIS — Z85048 Personal history of other malignant neoplasm of rectum, rectosigmoid junction, and anus: Secondary | ICD-10-CM

## 2012-10-19 DIAGNOSIS — D638 Anemia in other chronic diseases classified elsewhere: Secondary | ICD-10-CM

## 2012-10-19 DIAGNOSIS — C189 Malignant neoplasm of colon, unspecified: Secondary | ICD-10-CM

## 2012-10-19 NOTE — Patient Instructions (Signed)
Nicholas [name]  [id number]   [facility] - AFTER VISIT SUMMARY   **RECOMMENDATIONS MADE BY THE CONSULTANT AND ANY TEST    RESULTS WILL BE SENT TO YOUR REFERRING DOCTORS.   YOUR EXAM FINDINGS, LABS AND RESULTS WERE DISCUSSED BY YOUR MD TODAY.  YOU CAN GO TO THE [facility] WEB SITE FOR INSTRUCTIONS ON HOW TO ASSESS MY CHART FOR ADDITIONAL INFORMATION AS NEEDED.  Your Updated drug allergies are: Allergies as of 10/19/2012 - Review Complete 10/19/2012  Allergen Reaction Noted  . Cephalexin  05/05/2012  . Ciprofloxacin Other (See Comments)   . Hctz (hydrochlorothiazide)  05/26/2011  . Levofloxacin  05/05/2012  . Lipitor (atorvastatin calcium)  05/26/2011  . Niaspan (niacin er)  05/26/2011  . Oxycodone-acetaminophen Other (See Comments)   . Sulfonamide derivatives Other (See Comments)     Your current list of medications are: Current Outpatient Prescriptions  Medication Sig Dispense Refill  . amLODipine-benazepril (LOTREL) 5-20 MG per capsule Take 1 capsule by mouth daily.      Marland Kitchen aspirin 81 MG tablet Take 81 mg by mouth daily.      . Calcium Carbonate-Vitamin D (CALCIUM 600-D) 600-400 MG-UNIT per tablet Take 1 tablet by mouth 2 (two) times daily.       Marland Kitchen doxazosin (CARDURA) 8 MG tablet Take 8 mg by mouth at bedtime.       Marland Kitchen ezetimibe-simvastatin (VYTORIN) 10-20 MG per tablet Take 1 tablet by mouth at bedtime.      . finasteride (PROSCAR) 5 MG tablet Take 5 mg by mouth daily.        . furosemide (LASIX) 20 MG tablet Take 1 tablet (20 mg total) by mouth 2 (two) times daily as needed.  60 tablet  11  . metoprolol succinate (TOPROL-XL) 50 MG 24 hr tablet Take 50 mg by mouth daily.      . pantoprazole (PROTONIX) 40 MG tablet Take one tablet by mouth once a day  30 tablet  11  . potassium chloride (KLOR-CON) 20 MEQ packet Take 20 mEq by mouth daily.  30 tablet  6  . Saw Palmetto, Serenoa repens, (SAW PALMETTO PO) Take 1 capsule by mouth every evening.      . vitamin C  (ASCORBIC ACID) 500 MG tablet Take 500 mg by mouth daily.        . Vitamins-Lipotropics (B-100 COMPLEX) TABS Take 1 tablet by mouth daily.       . nitroGLYCERIN (NITROSTAT) 0.4 MG SL tablet Place 0.4 mg under the tongue every 5 (five) minutes as needed. For chest pain         INSTRUCTIONS GIVEN AND DISCUSSED:  See attached schedule   SPECIAL INSTRUCTIONS/FOLLOW-UP:  See above.  I acknowledge that I have been informed and understand all the instructions given to me and received a copy.I know to contact the clinic, my physician, or go to the emergency Department if any problems should occur.   I do not have any more questions at this time, but understand that I may call the [facility] at [phone] during business hours should I have any further questions or need assistance in obtaining follow-up care.

## 2012-10-19 NOTE — Telephone Encounter (Signed)
Printed and gv pt appt schedule for OCT 2014. ° °

## 2012-10-19 NOTE — Progress Notes (Signed)
This office note has been dictated.

## 2012-10-19 NOTE — Progress Notes (Signed)
CC:   Nicholas [name], M.D. [name], M.D. [name], M.D. [name], MD, [name], FAGA  IDENTIFYING STATEMENT:  The patient is a 78 year old man with colorectal cancer who presents for follow-up.  INTERVAL HISTORY:  Nicholas [name] was seen a year ago.  He reports that he received a colonoscopy on 06/14/2011 to investigate hematochezia.  I reviewed those results and the colonoscopy was unremarkable.  It was felt that bleeding was from the stoma which continues to be irrigated and will frequently bleed.  He  otherwise feels well, has no nausea or vomiting.  His bowel function has not changed.  His weight is stable.  We reviewed results of a recent CT scan of the chest, abdomen, and pelvis on 10/16/2012.  There was no evidence of thoracic, abdominal, or pelvic metastatic disease.  A stable ventral and parastomal hernia with out evidence of bowel obstruction.  MEDICATIONS:  Reviewed and updated.  ALLERGIES:  Cipro, sulfa, oxycodone.  REVIEW OF SYSTEMS:  Ten-point review of systems negative.  PHYSICAL EXAM:  General:  The patient is a well-appearing, well- nourished man in no distress.  Vitals:  Pulse 59, blood pressure 156/66, temperature 96.9, respirations 20, weight 222.4 pounds.  HEENT:  Head is atraumatic, normocephalic.  Sclerae anicteric.  Mouth moist.  Neck: Supple.  Chest:  Clear.  CVS:  First and second heart sounds present. No added sounds or murmurs.  Abdomen:  Soft, nontender.  No masses. Colostomy filled with semi-formed stool.  Bowel sounds present. Extremities:  No edema.  Lymph nodes:  No adenopathy.  CNS:  Nonfocal.  LAB DATA:  10/13/2012 white cell count 6.5, hemoglobin 11.8, hematocrit 36.5, platelets 147.  Sodium 140, potassium 4.7, chloride 105, CO2 26, BUN 21, creatinine 1.7, glucose 99, T bilirubin 0.8, alkaline phosphatase 68, AST 18, ALT 13, calcium 10.1.  CEA 0.8.  Results of CTs as in interval history.  IMPRESSION AND PLAN:  Mr.  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  He is status post neoadjuvant radiation therapy concurrent with 5-FU.  He also received adjuvant Xeloda for 4 months.  Most recent colonoscopy in 05/2011 was unremarkable.  His CT scans indicate no evidence of recurrence.  So with this said, he will Continues with surveillance and he follows up in a year's time with labs to include a CEA level. 2) Anemia of chronic disease.    ______________________________ Laurice Record, M.D. LIO/MEDQ  D:  10/19/2012  T:  10/19/2012  Job:  [id number]

## 2012-10-20 ENCOUNTER — Ambulatory Visit: Payer: Medicare Other | Admitting: Hematology and Oncology

## 2012-12-05 ENCOUNTER — Other Ambulatory Visit: Payer: Self-pay

## 2012-12-05 MED ORDER — AMLODIPINE BESY-BENAZEPRIL HCL 5-20 MG PO CAPS: 1.0000 | ORAL_CAPSULE | Freq: Every day | ORAL | Status: DC

## 2013-01-11 ENCOUNTER — Other Ambulatory Visit: Payer: Self-pay

## 2013-01-11 MED ORDER — EZETIMIBE-SIMVASTATIN 10-20 MG PO TABS: 1.0000 | ORAL_TABLET | Freq: Every day | ORAL | Status: DC

## 2013-01-11 MED ORDER — METOPROLOL SUCCINATE ER 50 MG PO TB24: 50.0000 mg | ORAL_TABLET | Freq: Every day | ORAL | Status: DC

## 2013-01-26 ENCOUNTER — Other Ambulatory Visit (INDEPENDENT_AMBULATORY_CARE_PROVIDER_SITE_OTHER): Payer: Medicare Other

## 2013-01-26 DIAGNOSIS — I119 Hypertensive heart disease without heart failure: Secondary | ICD-10-CM

## 2013-01-26 LAB — HEPATIC FUNCTION PANEL
AST: 19 U/L (ref 0–37)
Alkaline Phosphatase: 64 U/L (ref 39–117)
Bilirubin, Direct: 0.1 mg/dL (ref 0.0–0.3)
Total Bilirubin: 0.8 mg/dL (ref 0.3–1.2)

## 2013-01-26 LAB — BASIC METABOLIC PANEL
CO2: 28 mEq/L (ref 19–32)
Calcium: 9.5 mg/dL (ref 8.4–10.5)
Creatinine, Ser: 1.6 mg/dL — ABNORMAL HIGH (ref 0.4–1.5)
Glucose, Bld: 104 mg/dL — ABNORMAL HIGH (ref 70–99)

## 2013-01-26 LAB — LIPID PANEL: Total CHOL/HDL Ratio: 2

## 2013-01-26 NOTE — Progress Notes (Signed)
Quick Note:  Please make copy of labs for patient visit. ______ 

## 2013-01-29 ENCOUNTER — Encounter: Payer: Self-pay | Admitting: Cardiology

## 2013-01-29 ENCOUNTER — Ambulatory Visit (INDEPENDENT_AMBULATORY_CARE_PROVIDER_SITE_OTHER): Payer: Medicare Other | Admitting: Cardiology

## 2013-01-29 VITALS — BP 140/66 | HR 60 | Resp 18 | Ht 70.0 in | Wt 224.0 lb

## 2013-01-29 DIAGNOSIS — I10 Essential (primary) hypertension: Secondary | ICD-10-CM

## 2013-01-29 DIAGNOSIS — I4949 Other premature depolarization: Secondary | ICD-10-CM

## 2013-01-29 DIAGNOSIS — I119 Hypertensive heart disease without heart failure: Secondary | ICD-10-CM

## 2013-01-29 DIAGNOSIS — I493 Ventricular premature depolarization: Secondary | ICD-10-CM

## 2013-01-29 DIAGNOSIS — E7800 Pure hypercholesterolemia, unspecified: Secondary | ICD-10-CM

## 2013-01-29 NOTE — Assessment & Plan Note (Signed)
Blood pressure was remaining stable on current therapy.  He is not having any orthopnea or paroxysmal nocturnal dyspnea.  No dizziness or syncope.

## 2013-01-29 NOTE — Progress Notes (Signed)
Nicholas [name] Date of Birth:  [birth year] [facility] [address] [place], [place]  [address] [phone]         Fax   [phone]  History of Present Illness: This pleasant gentleman is seen for a scheduled followup office visit. He has a past history of ischemic heart disease and had coronary artery bypass graft surgery on 05/27/92. He has a past history of compensated congestive heart failure. He had a nuclear stress test in June 2010 showing an old inferior scar but no reversible ischemia and his ejection fraction was 51%. He underwent successful right knee replacement on February 28, 2012. He is doing well from the standpoint of his knee. He had a recent checkup with his orthopedist who was pleased with the surgical result and encouraged the patient to use an exercise bicycle.  Since last visit he's had no new cardiac symptoms.  He continues to have moderate bilateral peripheral edema.  Current Outpatient Prescriptions  Medication Sig Dispense Refill  . amLODipine-benazepril (LOTREL) 5-20 MG per capsule Take 1 capsule by mouth daily.  30 capsule  3  . aspirin 81 MG tablet Take 81 mg by mouth daily.      . Calcium Carbonate-Vitamin D (CALCIUM 600-D) 600-400 MG-UNIT per tablet Take 1 tablet by mouth 2 (two) times daily.       Marland Kitchen doxazosin (CARDURA) 8 MG tablet Take 8 mg by mouth at bedtime.       Marland Kitchen ezetimibe-simvastatin (VYTORIN) 10-20 MG per tablet Take 1 tablet by mouth at bedtime.  30 tablet  5  . finasteride (PROSCAR) 5 MG tablet Take 5 mg by mouth daily.        . furosemide (LASIX) 20 MG tablet Take 1 tablet (20 mg total) by mouth 2 (two) times daily as needed.  60 tablet  11  . metoprolol succinate (TOPROL-XL) 50 MG 24 hr tablet Take 1 tablet (50 mg total) by mouth daily.  30 tablet  5  . nitroGLYCERIN (NITROSTAT) 0.4 MG SL tablet Place 0.4 mg under the tongue every 5 (five) minutes as needed. For chest pain      . pantoprazole (PROTONIX) 40 MG tablet Take one  tablet by mouth once a day  30 tablet  11  . potassium chloride (KLOR-CON) 20 MEQ packet Take 20 mEq by mouth daily.  30 tablet  6  . Saw Palmetto, Serenoa repens, (SAW PALMETTO PO) Take 1 capsule by mouth every evening.      . vitamin C (ASCORBIC ACID) 500 MG tablet Take 500 mg by mouth daily.        . Vitamins-Lipotropics (B-100 COMPLEX) TABS Take 1 tablet by mouth daily.         Allergies  Allergen Reactions  . Cephalexin   . Ciprofloxacin Other (See Comments)    Reaction unknown  . Hctz (Hydrochlorothiazide)     DIZZINESS   . Levofloxacin   . Lipitor (Atorvastatin Calcium)     MUSCLE PAIN  . Niaspan (Niacin Er)     FLUSHING   . Oxycodone-Acetaminophen Other (See Comments)    Reaction unknown  . Sulfonamide Derivatives Other (See Comments)    Reaction unkonwn    Patient Active Problem List  Diagnosis  . CARCINOMA, RECTUM  . HYPERCHOLESTEROLEMIA  . ANEMIA, IRON DEFICIENCY  . HYPERTENSION  . MYOCARDIAL INFARCTION  . GERD  . DIVERTICULOSIS, COLON  . DEGENERATIVE JOINT DISEASE, GENERALIZED  . COLONIC POLYPS, ADENOMATOUS, HX OF  . Personal history of  malignant neoplasm of rectum, rectosigmoid junction, and anus  . GERD (gastroesophageal reflux disease)  . Hernia of abdominal cavity  . Hx of CABG  . Asymptomatic PVCs  . BPH (benign prostatic hyperplasia)  . Renal insufficiency    History  Smoking status  . Former Smoker -- 10 years  . Types: Pipe, Cigars  . Quit date: 05/26/1991  Smokeless tobacco  . Current User    History  Alcohol Use No    Family History  Problem Relation Age of Onset  . Pancreatic cancer Father   . Diabetes Son   . Emphysema Mother   . Anesthesia problems Neg Hx   . Hypotension Neg Hx   . Malignant hyperthermia Neg Hx   . Pseudochol deficiency Neg Hx     Review of Systems: Constitutional: no fever chills diaphoresis or fatigue or change in weight.  Head and neck: no hearing loss, no epistaxis, no photophobia or visual  disturbance. Respiratory: No cough, shortness of breath or wheezing. Cardiovascular: No chest pain peripheral edema, palpitations. Gastrointestinal: No abdominal distention, no abdominal pain, no change in bowel habits hematochezia or melena. Genitourinary: No dysuria, no frequency, no urgency, no nocturia. Musculoskeletal:No arthralgias, no back pain, no gait disturbance or myalgias. Neurological: No dizziness, no headaches, no numbness, no seizures, no syncope, no weakness, no tremors. Hematologic: No lymphadenopathy, no easy bruising. Psychiatric: No confusion, no hallucinations, no sleep disturbance.    Physical Exam: Filed Vitals:   01/29/13 1554  BP: 140/66  Pulse: 60  Resp: 18   the general appearance reveals a well-developed well-nourished gentleman in no distress.The head and neck exam reveals pupils equal and reactive.  Extraocular movements are full.  There is no scleral icterus.  The mouth and pharynx are normal.  The neck is supple.  The carotids reveal no bruits.  The jugular venous pressure is normal.  The  thyroid is not enlarged.  There is no lymphadenopathy.  The chest is clear to percussion and auscultation.  There are no rales or rhonchi.  Expansion of the chest is symmetrical.  The precordium is quiet.  The first heart sound is normal.  The second heart sound is physiologically split.  There is no murmur gallop rub or click.  There is no abnormal lift or heave.  The abdomen is soft and nontender.  The bowel sounds are normal.  The liver and spleen are not enlarged.  There are no abdominal masses.  There is a colostomy in the left lower quadrant. There are no abdominal bruits.  Extremities reveal good pedal pulses.  There is no phlebitis or edema.  There is no cyanosis or clubbing.  Strength is normal and symmetrical in all extremities.  There is no lateralizing weakness.  There are no sensory deficits.  The skin is warm and dry.  There is no rash.     Assessment /  Plan: Continue same medication.  His weight is up 4 pounds since last visit he has been eating more sweets and cookies.  Blood sugar is slightly elevated.  Recheck in 4 months for office visit and fasting lab work.

## 2013-01-29 NOTE — Assessment & Plan Note (Signed)
Cholesterol was remaining stable and satisfactory and Vytorin 10/20 one daily.  No side effects from the Vytorin

## 2013-01-29 NOTE — Patient Instructions (Addendum)
Your physician recommends that you continue on your current medications as directed. Please refer to the Current Medication list given to you today.  Work on losing [employer]   Your physician recommends that you schedule a follow-up appointment in: 4 months with fasting labs (lp/bmet/hfp)

## 2013-01-29 NOTE — Assessment & Plan Note (Signed)
Auscultation reveals frequent asymptomatic PVCs.  He has long runs of ventricular bigeminy on auscultation.  His lab work is satisfactory and there is no evidence of hypokalemia

## 2013-02-13 ENCOUNTER — Other Ambulatory Visit: Payer: Self-pay | Admitting: *Deleted

## 2013-02-13 MED ORDER — NITROGLYCERIN 0.4 MG SL SUBL: 0.4000 mg | SUBLINGUAL_TABLET | SUBLINGUAL | Status: AC | PRN

## 2013-02-20 ENCOUNTER — Telehealth: Payer: Self-pay | Admitting: Oncology

## 2013-02-20 ENCOUNTER — Encounter: Payer: Self-pay | Admitting: Oncology

## 2013-02-20 NOTE — Telephone Encounter (Signed)
spoke with pt gave d/t for appts.Marland Kitchentd

## 2013-04-08 ENCOUNTER — Emergency Department (HOSPITAL_COMMUNITY)
Admission: EM | Admit: 2013-04-08 | Discharge: 2013-04-08 | Disposition: A | Discharge status: Home / Self Care | Payer: Medicare Other | Attending: Emergency Medicine | Admitting: Emergency Medicine

## 2013-04-08 ENCOUNTER — Emergency Department (HOSPITAL_COMMUNITY): Payer: Medicare Other

## 2013-04-08 ENCOUNTER — Encounter (HOSPITAL_COMMUNITY): Payer: Self-pay | Admitting: Family Medicine

## 2013-04-08 DIAGNOSIS — Z85828 Personal history of other malignant neoplasm of skin: Secondary | ICD-10-CM | POA: Insufficient documentation

## 2013-04-08 DIAGNOSIS — I259 Chronic ischemic heart disease, unspecified: Secondary | ICD-10-CM | POA: Insufficient documentation

## 2013-04-08 DIAGNOSIS — E7800 Pure hypercholesterolemia, unspecified: Secondary | ICD-10-CM | POA: Insufficient documentation

## 2013-04-08 DIAGNOSIS — Z8739 Personal history of other diseases of the musculoskeletal system and connective tissue: Secondary | ICD-10-CM | POA: Insufficient documentation

## 2013-04-08 DIAGNOSIS — Z8719 Personal history of other diseases of the digestive system: Secondary | ICD-10-CM | POA: Insufficient documentation

## 2013-04-08 DIAGNOSIS — Z85038 Personal history of other malignant neoplasm of large intestine: Secondary | ICD-10-CM | POA: Insufficient documentation

## 2013-04-08 DIAGNOSIS — K219 Gastro-esophageal reflux disease without esophagitis: Secondary | ICD-10-CM | POA: Insufficient documentation

## 2013-04-08 DIAGNOSIS — I252 Old myocardial infarction: Secondary | ICD-10-CM | POA: Insufficient documentation

## 2013-04-08 DIAGNOSIS — J40 Bronchitis, not specified as acute or chronic: Secondary | ICD-10-CM

## 2013-04-08 DIAGNOSIS — Z8709 Personal history of other diseases of the respiratory system: Secondary | ICD-10-CM | POA: Insufficient documentation

## 2013-04-08 DIAGNOSIS — I10 Essential (primary) hypertension: Secondary | ICD-10-CM | POA: Insufficient documentation

## 2013-04-08 DIAGNOSIS — Z862 Personal history of diseases of the blood and blood-forming organs and certain disorders involving the immune mechanism: Secondary | ICD-10-CM | POA: Insufficient documentation

## 2013-04-08 DIAGNOSIS — Z87891 Personal history of nicotine dependence: Secondary | ICD-10-CM | POA: Insufficient documentation

## 2013-04-08 DIAGNOSIS — N390 Urinary tract infection, site not specified: Secondary | ICD-10-CM

## 2013-04-08 DIAGNOSIS — R609 Edema, unspecified: Secondary | ICD-10-CM | POA: Insufficient documentation

## 2013-04-08 DIAGNOSIS — I509 Heart failure, unspecified: Secondary | ICD-10-CM | POA: Insufficient documentation

## 2013-04-08 DIAGNOSIS — I2510 Atherosclerotic heart disease of native coronary artery without angina pectoris: Secondary | ICD-10-CM | POA: Insufficient documentation

## 2013-04-08 DIAGNOSIS — G4700 Insomnia, unspecified: Secondary | ICD-10-CM | POA: Insufficient documentation

## 2013-04-08 DIAGNOSIS — Z79899 Other long term (current) drug therapy: Secondary | ICD-10-CM | POA: Insufficient documentation

## 2013-04-08 DIAGNOSIS — N400 Enlarged prostate without lower urinary tract symptoms: Secondary | ICD-10-CM | POA: Insufficient documentation

## 2013-04-08 DIAGNOSIS — Z7982 Long term (current) use of aspirin: Secondary | ICD-10-CM | POA: Insufficient documentation

## 2013-04-08 DIAGNOSIS — Z85048 Personal history of other malignant neoplasm of rectum, rectosigmoid junction, and anus: Secondary | ICD-10-CM | POA: Insufficient documentation

## 2013-04-08 LAB — URINALYSIS, ROUTINE W REFLEX MICROSCOPIC
Bilirubin Urine: NEGATIVE
Protein, ur: NEGATIVE mg/dL
Urobilinogen, UA: 1 mg/dL (ref 0.0–1.0)

## 2013-04-08 LAB — CBC WITH DIFFERENTIAL/PLATELET
Eosinophils Absolute: 0.1 10*3/uL (ref 0.0–0.7)
Hemoglobin: 10.6 g/dL — ABNORMAL LOW (ref 13.0–17.0)
Lymphocytes Relative: 8 % — ABNORMAL LOW (ref 12–46)
Lymphs Abs: 1.1 10*3/uL (ref 0.7–4.0)
MCH: 29.1 pg (ref 26.0–34.0)
MCV: 87.4 fL (ref 78.0–100.0)
Monocytes Relative: 7 % (ref 3–12)
Neutrophils Relative %: 85 % — ABNORMAL HIGH (ref 43–77)
RBC: 3.64 MIL/uL — ABNORMAL LOW (ref 4.22–5.81)
WBC: 13.7 10*3/uL — ABNORMAL HIGH (ref 4.0–10.5)

## 2013-04-08 LAB — URINE MICROSCOPIC-ADD ON

## 2013-04-08 LAB — BASIC METABOLIC PANEL
BUN: 32 mg/dL — ABNORMAL HIGH (ref 6–23)
CO2: 25 mEq/L (ref 19–32)
Chloride: 100 mEq/L (ref 96–112)
GFR calc non Af Amer: 27 mL/min — ABNORMAL LOW (ref >90)
Glucose, Bld: 104 mg/dL — ABNORMAL HIGH (ref 70–99)
Potassium: 4.3 mEq/L (ref 3.5–5.1)

## 2013-04-08 MED ORDER — AZITHROMYCIN 250 MG PO TABS: ORAL_TABLET | ORAL | Status: DC

## 2013-04-08 MED ORDER — NITROFURANTOIN MONOHYD MACRO 100 MG PO CAPS: 100.0000 mg | ORAL_CAPSULE | Freq: Two times a day (BID) | ORAL | Status: DC

## 2013-04-08 MED ORDER — IPRATROPIUM BROMIDE 0.02 % IN SOLN
0.5000 mg | Freq: Once | RESPIRATORY_TRACT | Status: AC
  Administered 2013-04-08: 0.5 mg via RESPIRATORY_TRACT
  Filled 2013-04-08 (×2): qty 2.5

## 2013-04-08 MED ORDER — ALBUTEROL SULFATE HFA 108 (90 BASE) MCG/ACT IN AERS: 1.0000 | INHALATION_SPRAY | Freq: Four times a day (QID) | RESPIRATORY_TRACT | Status: DC | PRN

## 2013-04-08 MED ORDER — DEXTROSE 5 % IV SOLN: 1.0000 g | Freq: Once | INTRAVENOUS | Status: DC

## 2013-04-08 MED ORDER — ALBUTEROL SULFATE (5 MG/ML) 0.5% IN NEBU
5.0000 mg | INHALATION_SOLUTION | Freq: Once | RESPIRATORY_TRACT | Status: AC
  Administered 2013-04-08: 5 mg via RESPIRATORY_TRACT
  Filled 2013-04-08: qty 1

## 2013-04-08 NOTE — ED Notes (Addendum)
Per pt sts cough since Friday and unable to cough anything up. sts taking OTC meds without relief. sts also weak. Denies chest pain, SOB.

## 2013-04-08 NOTE — ED Provider Notes (Addendum)
History     CSN: [id number]  Arrival date & time 04/08/13  1214   First MD Initiated Contact with Patient 04/08/13 1247      Chief Complaint  Patient presents with  . Cough    (Consider location/radiation/quality/duration/timing/severity/associated sxs/prior treatment) HPI... cough, wheezing for several days. No fever, chills, dysuria, chest pain. Does complain of dyspnea on exertion. Severity is mild to moderate. No radiation of discomfort.  Past Medical History  Diagnosis Date  . 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 Pure hypercholesterolemia     takes Vytorin daily  . Personal history of colonic polyps     adenomatous  . Diverticulosis of colon (without mention of hemorrhage)   . Cardiovascular disease   . IHD (ischemic heart disease)   . History of colon cancer   . Colon cancer   . Hypertension     takes Amlodipine,Metoprolol,and Cardura daily  . Coronary artery disease   . Acute myocardial infarction, unspecified site, episode of care unspecified   . MI (myocardial infarction) 1993  . CHF (congestive heart failure)     takes Lasix daily  . H/O blood clots 1993  . Dysrhythmia   . Shortness of breath     with exertion  . Arthritis   . Joint pain   . Joint swelling   . Edema     to right leg-below knee;not any different than when he saw brackbill in jan 2013  . Bruises easily     takes ASA daily  . Skin cancer     nose  . GERD (gastroesophageal reflux disease)     takes Protonix daily  . Diarrhea   . Constipation   . Enlarged prostate     takes Finasteride daily  . Blood transfusion 2008  . Insomnia     Past Surgical History  Procedure Laterality Date  . Shoulder surgery  2007    left  . Cataract extraction      right  . Abdominoperineal proctocolectomy  06/01/2007  . Coronary angioplasty  05/22/92  . Colostomy  2008  . Colonoscopy    . Coronary artery bypass graft  1993  . Colon surgery  2008    colectomy  . Knee arthroscopy  2010    right    . Total knee arthroplasty  02/28/2012    Procedure: TOTAL KNEE ARTHROPLASTY;  Surgeon: Raymon Mutton, MD;  Location: [facility] OR;  Service: Orthopedics;  Laterality: Right;    Family History  Problem Relation Age of Onset  . Pancreatic cancer Father   . Diabetes Son   . Emphysema Mother   . Anesthesia problems Neg Hx   . Hypotension Neg Hx   . Malignant hyperthermia Neg Hx   . Pseudochol deficiency Neg Hx     History  Substance Use Topics  . Smoking status: Former Smoker -- 10 years    Types: Pipe, Cigars    Quit date: 05/26/1991  . Smokeless tobacco: Current User  . Alcohol Use: No      Review of Systems  All other systems reviewed and are negative.    Allergies  Cephalexin; Ciprofloxacin; Hctz; Levofloxacin; Lipitor; Niaspan; Oxycodone-acetaminophen; and Sulfonamide derivatives  Home Medications   Current Outpatient Rx  Name  Route  Sig  Dispense  Refill  . amLODipine-benazepril (LOTREL) 5-20 MG per capsule   Oral   Take 1 capsule by mouth daily.   30 capsule   3   . aspirin 81 MG tablet  Oral   Take 81 mg by mouth daily.         . Calcium Carbonate-Vitamin D (CALCIUM 600-D) 600-400 MG-UNIT per tablet   Oral   Take 1 tablet by mouth 2 (two) times daily.          Marland Kitchen doxazosin (CARDURA) 8 MG tablet   Oral   Take 8 mg by mouth at bedtime.          Marland Kitchen ezetimibe-simvastatin (VYTORIN) 10-20 MG per tablet   Oral   Take 1 tablet by mouth at bedtime.   30 tablet   5   . finasteride (PROSCAR) 5 MG tablet   Oral   Take 5 mg by mouth daily.           . furosemide (LASIX) 20 MG tablet   Oral   Take 1 tablet (20 mg total) by mouth 2 (two) times daily as needed.   60 tablet   11   . metoprolol succinate (TOPROL-XL) 50 MG 24 hr tablet   Oral   Take 1 tablet (50 mg total) by mouth daily.   30 tablet   5   . nitroGLYCERIN (NITROSTAT) 0.4 MG SL tablet   Sublingual   Place 1 tablet (0.4 mg total) under the tongue every 5 (five) minutes as needed. For  chest pain   25 tablet   6   . pantoprazole (PROTONIX) 40 MG tablet   Oral   Take 40 mg by mouth daily.         . potassium chloride (KLOR-CON) 20 MEQ packet   Oral   Take 20 mEq by mouth daily.   30 tablet   6   . Saw Palmetto, Serenoa repens, (SAW PALMETTO PO)   Oral   Take 1 capsule by mouth every evening.         . vitamin C (ASCORBIC ACID) 500 MG tablet   Oral   Take 500 mg by mouth daily.           . Vitamins-Lipotropics (B-100 COMPLEX) TABS   Oral   Take 1 tablet by mouth daily.          Marland Kitchen albuterol (PROVENTIL HFA;VENTOLIN HFA) 108 (90 BASE) MCG/ACT inhaler   Inhalation   Inhale 1-2 puffs into the lungs every 6 (six) hours as needed for wheezing.   1 Inhaler   0   . azithromycin (ZITHROMAX Z-PAK) 250 MG tablet      2 po day one, then 1 daily x 4 days   5 tablet   0   . nitrofurantoin, macrocrystal-monohydrate, (MACROBID) 100 MG capsule   Oral   Take 1 capsule (100 mg total) by mouth 2 (two) times daily. X 7 days   14 capsule   0     BP 123/24  Pulse 59  Temp(Src) 98 F (36.7 C) (Oral)  Resp 15  Ht 5\' 10"  (1.778 m)  Wt 224 lb 8 oz (101.833 kg)  BMI 32.21 kg/m2  SpO2 100%  Physical Exam  Nursing note and vitals reviewed. Constitutional: He is oriented to person, place, and time. He appears well-developed and well-nourished.  HENT:  Head: Normocephalic and atraumatic.  Eyes: Conjunctivae and EOM are normal. Pupils are equal, round, and reactive to light.  Neck: Normal range of motion. Neck supple.  Cardiovascular: Normal rate, regular rhythm and normal heart sounds.   Pulmonary/Chest:  Minimal bilateral expiratory wheeze  Abdominal: Soft. Bowel sounds are normal.  Musculoskeletal: Normal range of motion.  Neurological: He is alert and oriented to person, place, and time.  Skin: Skin is warm and dry.  Psychiatric: He has a normal mood and affect.    ED Course  Procedures (including critical care time)  Labs Reviewed  URINALYSIS,  ROUTINE W REFLEX MICROSCOPIC - Abnormal; Notable for the following:    APPearance CLOUDY (*)    Hgb urine dipstick MODERATE (*)    Leukocytes, UA LARGE (*)    All other components within normal limits  CBC WITH DIFFERENTIAL - Abnormal; Notable for the following:    WBC 13.7 (*)    RBC 3.64 (*)    Hemoglobin 10.6 (*)    HCT 31.8 (*)    Platelets 106 (*)    Neutrophils Relative 85 (*)    Neutro Abs 11.6 (*)    Lymphocytes Relative 8 (*)    All other components within normal limits  BASIC METABOLIC PANEL - Abnormal; Notable for the following:    Glucose, Bld 104 (*)    BUN 32 (*)    Creatinine, Ser 2.24 (*)    GFR calc non Af Amer 27 (*)    GFR calc Af Amer 31 (*)    All other components within normal limits  URINE CULTURE  URINE MICROSCOPIC-ADD ON   Dg Chest 2 View  04/08/2013  *RADIOLOGY REPORT*  Clinical Data: 2-3 day history of cough and chest congestion. Current history of hypertension and hypercholesterolemia.  Prior history of rectal carcinoma.  CHEST - 2 VIEW  Comparison: CT chest 10/16/2012, 10/18/2011, 04/13/2010.  Two-view chest x-ray 04/14/2009.  Findings: Prior sternotomy for CABG.  Cardiac silhouette moderately enlarged but stable.  Pulmonary vascularity normal.  Mild hyperinflation, unchanged.  Lungs clear.  Bronchovascular markings normal.  No pleural effusions. Degenerative changes and DISH involving the thoracic spine.  Left shoulder arthroplasty with anatomic alignment.  IMPRESSION: Stable cardiomegaly.  Mild hyperinflation consistent with COPD and/or asthma.  No acute cardiopulmonary disease.   Original Report Authenticated By: Hulan Saas, M.D.     Date: 04/08/2013  Rate: 81  Rhythm: normal sinus rhythm  QRS Axis: normal  Intervals: normal  ST/T Wave abnormalities: normal  Conduction Disutrbances: none  Narrative Interpretation: unremarkable     1. Urinary tract infection   2. Bronchitis       MDM  Patient has a upper respiratory infection.  Additionally urinalysis shows 11-20 white cells. Will culture urine. Rx Macrobid and Zithromax secondary to patient's immunocompromised state.  Also Rx for albuterol inhaler. Followup with primary care Dr.        Donnetta Hutching, MD 04/08/13 1616  Donnetta Hutching, MD 04/08/13 [phone]

## 2013-04-09 ENCOUNTER — Other Ambulatory Visit: Payer: Self-pay | Admitting: *Deleted

## 2013-04-09 MED ORDER — AMLODIPINE BESY-BENAZEPRIL HCL 5-20 MG PO CAPS: 1.0000 | ORAL_CAPSULE | Freq: Every day | ORAL | Status: DC

## 2013-04-10 LAB — URINE CULTURE: Colony Count: >=100000

## 2013-04-11 ENCOUNTER — Telehealth (HOSPITAL_COMMUNITY): Payer: Self-pay | Admitting: Emergency Medicine

## 2013-04-13 ENCOUNTER — Telehealth (HOSPITAL_COMMUNITY): Payer: Self-pay | Admitting: Emergency Medicine

## 2013-05-18 ENCOUNTER — Other Ambulatory Visit: Payer: Self-pay | Admitting: *Deleted

## 2013-05-18 MED ORDER — POTASSIUM CHLORIDE 20 MEQ PO PACK: 20.0000 meq | PACK | Freq: Every day | ORAL | Status: DC

## 2013-06-08 ENCOUNTER — Other Ambulatory Visit (INDEPENDENT_AMBULATORY_CARE_PROVIDER_SITE_OTHER): Payer: Medicare Other

## 2013-06-08 DIAGNOSIS — E7800 Pure hypercholesterolemia, unspecified: Secondary | ICD-10-CM

## 2013-06-08 DIAGNOSIS — I119 Hypertensive heart disease without heart failure: Secondary | ICD-10-CM

## 2013-06-08 LAB — HEPATIC FUNCTION PANEL
Albumin: 3.9 g/dL (ref 3.5–5.2)
Total Protein: 6.7 g/dL (ref 6.0–8.3)

## 2013-06-08 LAB — BASIC METABOLIC PANEL
Chloride: 108 mEq/L (ref 96–112)
Creatinine, Ser: 1.6 mg/dL — ABNORMAL HIGH (ref 0.4–1.5)
Potassium: 5.4 mEq/L — ABNORMAL HIGH (ref 3.5–5.1)
Sodium: 145 mEq/L (ref 135–145)

## 2013-06-08 LAB — LIPID PANEL
HDL: 52.4 mg/dL (ref >39.00)
LDL Cholesterol: 54 mg/dL (ref 0–99)
Triglycerides: 47 mg/dL (ref 0.0–149.0)
VLDL: 9.4 mg/dL (ref 0.0–40.0)

## 2013-06-08 NOTE — Progress Notes (Signed)
Quick Note:  Please make copy of labs for patient visit. ______ 

## 2013-06-11 ENCOUNTER — Encounter: Payer: Self-pay | Admitting: Cardiology

## 2013-06-11 ENCOUNTER — Ambulatory Visit (INDEPENDENT_AMBULATORY_CARE_PROVIDER_SITE_OTHER): Payer: Medicare Other | Admitting: Cardiology

## 2013-06-11 VITALS — BP 140/72 | HR 68 | Ht 70.0 in | Wt 222.8 lb

## 2013-06-11 DIAGNOSIS — I10 Essential (primary) hypertension: Secondary | ICD-10-CM

## 2013-06-11 DIAGNOSIS — E7800 Pure hypercholesterolemia, unspecified: Secondary | ICD-10-CM

## 2013-06-11 DIAGNOSIS — I219 Acute myocardial infarction, unspecified: Secondary | ICD-10-CM

## 2013-06-11 MED ORDER — POTASSIUM CHLORIDE 20 MEQ PO PACK: 20.0000 meq | PACK | ORAL | Status: DC

## 2013-06-11 MED ORDER — SIMVASTATIN 20 MG PO TABS: 20.0000 mg | ORAL_TABLET | Freq: Every day | ORAL | Status: DC

## 2013-06-11 NOTE — Progress Notes (Signed)
Nicholas [name] Date of Birth:  January 22, 1934 [facility] [address] [place], [place]  [address] [phone]         Fax   [phone]  History of Present Illness: This pleasant 79 year old gentleman is seen for a scheduled followup office visit. He has a past history of ischemic heart disease and had coronary artery bypass graft surgery on 05/27/92. He has a past history of compensated congestive heart failure. He had a nuclear stress test in June 2010 showing an old inferior scar but no reversible ischemia and his ejection fraction was 51%. He underwent successful right knee replacement on February 28, 2012. He is doing well from the standpoint of his knee. He had a recent checkup with his orthopedist who was pleased with the surgical result and encouraged the patient to use an exercise bicycle. Since last visit he's had no new cardiac symptoms. He continues to have moderate bilateral peripheral edema.  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   Current Outpatient Prescriptions  Medication Sig Dispense Refill  . albuterol (PROVENTIL HFA;VENTOLIN HFA) 108 (90 BASE) MCG/ACT inhaler Inhale 1-2 puffs into the lungs every 6 (six) hours as needed for wheezing.  1 Inhaler  0  . amLODipine-benazepril (LOTREL) 5-20 MG per capsule Take 1 capsule by mouth daily.  30 capsule  3  . aspirin 81 MG tablet Take 81 mg by mouth daily.      . Calcium Carbonate-Vitamin D (CALCIUM 600-D) 600-400 MG-UNIT per tablet Take 1 tablet by mouth 2 (two) times daily.       Marland Kitchen doxazosin (CARDURA) 8 MG tablet Take 8 mg by mouth at bedtime.       . finasteride (PROSCAR) 5 MG tablet Take 5 mg by mouth daily.        . furosemide (LASIX) 20 MG tablet Take 1 tablet (20 mg total) by mouth 2 (two) times daily as needed.  60 tablet  11  . metoprolol succinate (TOPROL-XL) 50 MG 24 hr tablet Take 1 tablet (50 mg total) by mouth daily.  30 tablet  5  . nitroGLYCERIN (NITROSTAT) 0.4 MG SL  tablet Place 1 tablet (0.4 mg total) under the tongue every 5 (five) minutes as needed. For chest pain  25 tablet  6  . pantoprazole (PROTONIX) 40 MG tablet Take 40 mg by mouth daily.      . potassium chloride (KLOR-CON) 20 MEQ packet Take 20 mEq by mouth every other day.  16 tablet  11  . Saw Palmetto, Serenoa repens, (SAW PALMETTO PO) Take 1 capsule by mouth every evening.      . vitamin C (ASCORBIC ACID) 500 MG tablet Take 500 mg by mouth daily.        . Vitamins-Lipotropics (B-100 COMPLEX) TABS Take 1 tablet by mouth daily.       . simvastatin (ZOCOR) 20 MG tablet Take 1 tablet (20 mg total) by mouth at bedtime.  90 tablet  3   No current facility-administered medications for this visit.    Allergies  Allergen Reactions  . Cephalexin   . Ciprofloxacin Other (See Comments)    Reaction unknown  . Hctz (Hydrochlorothiazide)     DIZZINESS   . Levofloxacin   . Lipitor (Atorvastatin Calcium)     MUSCLE PAIN  . Niaspan (Niacin Er)     FLUSHING   . Oxycodone-Acetaminophen Other (See Comments)    Reaction unknown  . Sulfonamide Derivatives Other (See Comments)  Reaction unkonwn    Patient Active Problem List   Diagnosis Date Noted  . Hx of CABG 05/27/2011    Priority: High  . Asymptomatic PVCs 05/27/2011    Priority: High  . HYPERTENSION 05/08/2009    Priority: High  . Renal insufficiency 05/05/2012  . BPH (benign prostatic hyperplasia) 09/21/2011  . Personal history of malignant neoplasm of rectum, rectosigmoid junction, and anus 05/14/2011  . GERD (gastroesophageal reflux disease) 05/14/2011  . Hernia of abdominal cavity 05/14/2011  . CARCINOMA, RECTUM 05/08/2009  . HYPERCHOLESTEROLEMIA 05/08/2009  . ANEMIA, IRON DEFICIENCY 05/08/2009  . MYOCARDIAL INFARCTION 05/08/2009  . GERD 05/08/2009  . DIVERTICULOSIS, COLON 05/08/2009  . DEGENERATIVE JOINT DISEASE, GENERALIZED 05/08/2009  . COLONIC POLYPS, ADENOMATOUS, HX OF 05/08/2009    History  Smoking status  . Former  Smoker -- 10 years  . Types: Pipe, Cigars  . Quit date: 05/26/1991  Smokeless tobacco  . Current User    History  Alcohol Use No    Family History  Problem Relation Age of Onset  . Pancreatic cancer Father   . Diabetes Son   . Emphysema Mother   . Anesthesia problems Neg Hx   . Hypotension Neg Hx   . Malignant hyperthermia Neg Hx   . Pseudochol deficiency Neg Hx     Review of Systems: Constitutional: no fever chills diaphoresis or fatigue or change in weight.  Head and neck: no hearing loss, no epistaxis, no photophobia or visual disturbance. Respiratory: No cough, shortness of breath or wheezing. Cardiovascular: No chest pain peripheral edema, palpitations. Gastrointestinal: No abdominal distention, no abdominal pain, no change in bowel habits hematochezia or melena. Genitourinary: No dysuria, no frequency, no urgency, no nocturia. Musculoskeletal:No arthralgias, no back pain, no gait disturbance or myalgias. Neurological: No dizziness, no headaches, no numbness, no seizures, no syncope, no weakness, no tremors. Hematologic: No lymphadenopathy, no easy bruising. Psychiatric: No confusion, no hallucinations, no sleep disturbance.    Physical Exam: Filed Vitals:   06/11/13 1043  BP: 140/72  Pulse: 68   the general appearance reveals a well-developed well-nourished gentleman in no distress.The head and neck exam reveals pupils equal and reactive.  Extraocular movements are full.  There is no scleral icterus.  The mouth and pharynx are normal.  The neck is supple.  The carotids reveal no bruits.  The jugular venous pressure is normal.  The  thyroid is not enlarged.  There is no lymphadenopathy.  The chest is clear to percussion and auscultation.  There are no rales or rhonchi.  Expansion of the chest is symmetrical.  The precordium is quiet.  The first heart sound is normal.  The second heart sound is physiologically split.  There is no murmur gallop rub or click.  There is no  abnormal lift or heave.  The abdomen is soft and nontender.  The bowel sounds are normal.  The liver and spleen are not enlarged.  There are no abdominal masses.  He has a colostomy in the left lower quadrant which is functioning normally. There are no abdominal bruits.  Extremities reveal good pedal pulses.  There is mild pretibial and ankle edema..  There is no cyanosis or clubbing.  Strength is normal and symmetrical in all extremities.  There is no lateralizing weakness.  There are no sensory deficits.  The skin is warm and dry.  There is no rash.    Assessment / Plan: Overall the patient is doing well.  His potassium is high and we are reducing  his K. Dur 20 mEq to just every other day. His cholesterol is low enough that we can switch him from Vytorin to plain simvastatin because of cost. Recheck in 4 months for followup office visit lipid panel hepatic function panel and basal metabolic panel

## 2013-06-11 NOTE — Assessment & Plan Note (Signed)
His total cholesterol is only 116 and he has been on Vytorin which is expensive for him.  We will stop Vytorin and switch to simvastatin 20 mg daily.

## 2013-06-11 NOTE — Assessment & Plan Note (Signed)
The patient has not been experiencing any chest pain or angina. 

## 2013-06-11 NOTE — Assessment & Plan Note (Signed)
No headaches or dizzy spells.  No palpitations.  Blood pressure was remaining stable on current therapy.  He has mild ankle edema and he is on low-dose amlodipine.

## 2013-06-11 NOTE — Patient Instructions (Signed)
Your physician has recommended you make the following change in your medication:  STOP VYTORIN START SIMVASTATIN 20 MG DAILY DECREASE KDUR TO 20 MEQ EVERY OTHER DAY  Your physician wants you to follow-up in: 4 MONTHS  You will receive a reminder letter in the mail two months in advance. If you don't receive a letter, please call our office to schedule the follow-up appointment.  Your physician recommends that you return for a FASTING lipid profile: 4 MONTHS/ LP/HFP/BMET

## 2013-06-20 ENCOUNTER — Other Ambulatory Visit: Payer: Self-pay | Admitting: *Deleted

## 2013-06-20 MED ORDER — PANTOPRAZOLE SODIUM 40 MG PO TBEC: 40.0000 mg | DELAYED_RELEASE_TABLET | Freq: Every day | ORAL | Status: DC

## 2013-07-05 ENCOUNTER — Other Ambulatory Visit: Payer: Self-pay | Admitting: *Deleted

## 2013-07-05 MED ORDER — METOPROLOL SUCCINATE ER 50 MG PO TB24: 50.0000 mg | ORAL_TABLET | Freq: Every day | ORAL | Status: DC

## 2013-07-09 ENCOUNTER — Telehealth: Payer: Self-pay | Admitting: Internal Medicine

## 2013-07-09 NOTE — Telephone Encounter (Signed)
Dr Jeannetta Nap called.  Patient was seen in their clinic over weekend with increase SOB and LE edema.  Rx with extra lasix   Today patient is breathing better  Wt down 1.5 lb.  Labs Cr 2.2.  EKG is irreg rhythm  He wonders if afib  Recomm  I told Dr Jeannetta Nap to fax EKG to our office for review Rec that someone in our office see patient tomorrow T Patty Sermons out of town We will need to call patient.

## 2013-07-09 NOTE — Telephone Encounter (Signed)
I spoke with the pt and scheduled him to see Dr Myrtis Ser on 07/10/13 at 2:30 as a DOD add on.  The pt will try to arrange transportation for this appointment.  I instructed the pt to call our office tomorrow morning if he cannot make this appointment. Pt verbalized understanding.

## 2013-07-10 ENCOUNTER — Encounter: Payer: Self-pay | Admitting: Cardiology

## 2013-07-10 ENCOUNTER — Ambulatory Visit (INDEPENDENT_AMBULATORY_CARE_PROVIDER_SITE_OTHER): Payer: Medicare Other | Admitting: Cardiology

## 2013-07-10 VITALS — BP 138/72 | HR 41 | Ht 70.0 in | Wt 230.8 lb

## 2013-07-10 DIAGNOSIS — R9430 Abnormal result of cardiovascular function study, unspecified: Secondary | ICD-10-CM | POA: Insufficient documentation

## 2013-07-10 DIAGNOSIS — I2510 Atherosclerotic heart disease of native coronary artery without angina pectoris: Secondary | ICD-10-CM | POA: Insufficient documentation

## 2013-07-10 DIAGNOSIS — I4891 Unspecified atrial fibrillation: Secondary | ICD-10-CM

## 2013-07-10 DIAGNOSIS — N289 Disorder of kidney and ureter, unspecified: Secondary | ICD-10-CM

## 2013-07-10 DIAGNOSIS — E8770 Fluid overload, unspecified: Secondary | ICD-10-CM

## 2013-07-10 DIAGNOSIS — E8779 Other fluid overload: Secondary | ICD-10-CM

## 2013-07-10 LAB — BASIC METABOLIC PANEL
BUN: 21 mg/dL (ref 6–23)
Chloride: 106 mEq/L (ref 96–112)
Creatinine, Ser: 2.1 mg/dL — ABNORMAL HIGH (ref 0.4–1.5)
Glucose, Bld: 96 mg/dL (ref 70–99)
Potassium: 4.4 mEq/L (ref 3.5–5.1)

## 2013-07-10 MED ORDER — METOPROLOL SUCCINATE ER 25 MG PO TB24: 25.0000 mg | ORAL_TABLET | Freq: Every day | ORAL | Status: DC

## 2013-07-10 MED ORDER — FUROSEMIDE 20 MG PO TABS: 20.0000 mg | ORAL_TABLET | Freq: Two times a day (BID) | ORAL | Status: DC | PRN

## 2013-07-10 NOTE — Assessment & Plan Note (Signed)
The patient has chronic edema. However his weight was 222 pounds in the office on June 16. He thinks it was as high as 235 several days ago. Today he is 230. I've encouraged him to take 40 mg of Lasix daily as opposed to the 20 mg that he was on recently. He will continue this dose until he is seen in the office early next week.  As part of this evaluation today have spent greater than 25 minutes with the patient's overall care. More than half of this time was spent with direct contact with him. In addition I have reviewed old records.

## 2013-07-10 NOTE — Assessment & Plan Note (Signed)
The patient's creatinine varies. He was up to 1.9 on July 12. It is possible that it may in fact be better with some diuresis since then. Chemistry will be checked today.

## 2013-07-10 NOTE — Progress Notes (Signed)
Patient ID: Nicholas [name], male   DOB: September 26, 1934, 79 y.o.   MRN: [id number]   HPI  Patient is seen today as an add-on for Dr. Patty Sermons.  The patient had seen Dr. Patty Sermons June 11, 2013. He was stable at that time. He did not have an EKG at that time but there is no report of an irregular rhythm. The patient tells me he has chronic edema. He saw his primary physician on July 07, 2013. The patient tells me he was short of breath that day. He says however that he has this intermittently. He does say that his edema was worse than usual. He was encouraged to increase his diuretic. Over the past several days he's taken 80 mg on 2 days and 60 mg yesterday. He has lost fluid weight. He is not having significant shortness of breath at this time.  It is of note that the patient had been on a higher dose of diuretics recently that was lowered because of renal dysfunction. Most recently he was on 20 mg daily with plans to take an extra dose if needed. He does have renal dysfunction. Labs over time have shown variation in creatinine. This has ranged from 1.6-2.0. His creatinine was 1.6 in the office on June 13. When checked by Dr. Jeannetta Nap on July this 12th, creatinine was 1.9.  He was also noted that the patient appeared to be in atrial fibrillation with a slow rate on July 12. He is now here for early followup.  Allergies  Allergen Reactions  . Cephalexin   . Ciprofloxacin Other (See Comments)    Reaction unknown  . Hctz (Hydrochlorothiazide)     DIZZINESS   . Levofloxacin   . Lipitor (Atorvastatin Calcium)     MUSCLE PAIN  . Niaspan (Niacin Er)     FLUSHING   . Oxycodone-Acetaminophen Other (See Comments)    Reaction unknown  . Sulfonamide Derivatives Other (See Comments)    Reaction unkonwn    Current Outpatient Prescriptions  Medication Sig Dispense Refill  . albuterol (PROVENTIL HFA;VENTOLIN HFA) 108 (90 BASE) MCG/ACT inhaler Inhale 1-2 puffs into the lungs every 6 (six) hours as needed for  wheezing.  1 Inhaler  0  . amLODipine-benazepril (LOTREL) 5-20 MG per capsule Take 1 capsule by mouth daily.  30 capsule  3  . aspirin 81 MG tablet Take 81 mg by mouth daily.      . Calcium Carbonate-Vitamin D (CALCIUM 600-D) 600-400 MG-UNIT per tablet Take 1 tablet by mouth 2 (two) times daily.       Marland Kitchen doxazosin (CARDURA) 8 MG tablet Take 8 mg by mouth at bedtime.       . Ezetimibe-Simvastatin (VYTORIN PO) Take by mouth daily. Last dose will be 07/11/13. Dr Patty Sermons changed to Simvastatin.      . finasteride (PROSCAR) 5 MG tablet Take 5 mg by mouth daily.        . furosemide (LASIX) 20 MG tablet Take 1 tablet (20 mg total) by mouth 2 (two) times daily as needed.  60 tablet  11  . metoprolol succinate (TOPROL-XL) 50 MG 24 hr tablet Take 1 tablet (50 mg total) by mouth daily.  30 tablet  3  . nitroGLYCERIN (NITROSTAT) 0.4 MG SL tablet Place 1 tablet (0.4 mg total) under the tongue every 5 (five) minutes as needed. For chest pain  25 tablet  6  . pantoprazole (PROTONIX) 40 MG tablet Take 1 tablet (40 mg total) by mouth daily.  30 tablet  0  . potassium chloride (KLOR-CON) 20 MEQ packet Take 20 mEq by mouth every other day.  16 tablet  11  . Saw Palmetto, Serenoa repens, (SAW PALMETTO PO) Take 1 capsule by mouth every evening.      . vitamin C (ASCORBIC ACID) 500 MG tablet Take 500 mg by mouth daily.        . Vitamins-Lipotropics (B-100 COMPLEX) TABS Take 1 tablet by mouth daily.       . simvastatin (ZOCOR) 20 MG tablet Take 1 tablet (20 mg total) by mouth at bedtime.  90 tablet  3   No current facility-administered medications for this visit.    History   Social History  . Marital Status: Married    Spouse Name: N/A    Number of Children: N/A  . Years of Education: N/A   Occupational History  . retired     Psychologist, sport and exercise   Social History Main Topics  . Smoking status: Former Smoker -- 10 years    Types: Pipe, Cigars    Quit date: 05/26/1991  . Smokeless tobacco: Current User  . Alcohol  Use: No  . Drug Use: No  . Sexually Active: Not on file   Other Topics Concern  . Not on file   Social History Narrative  . No narrative on file    Family History  Problem Relation Age of Onset  . Pancreatic cancer Father   . Diabetes Son   . Emphysema Mother   . Anesthesia problems Neg Hx   . Hypotension Neg Hx   . Malignant hyperthermia Neg Hx   . Pseudochol deficiency Neg Hx     Past Medical History  Diagnosis Date  . 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 Pure hypercholesterolemia     takes Vytorin daily  . Personal history of colonic polyps     adenomatous  . Diverticulosis of colon (without mention of hemorrhage)   . Cardiovascular disease   . IHD (ischemic heart disease)   . History of colon cancer   . Colon cancer   . Hypertension     takes Amlodipine,Metoprolol,and Cardura daily  . Coronary artery disease   . Acute myocardial infarction, unspecified site, episode of care unspecified   . MI (myocardial infarction) 1993  . CHF (congestive heart failure)     takes Lasix daily  . H/O blood clots 1993  . Dysrhythmia   . Shortness of breath     with exertion  . Arthritis   . Joint pain   . Joint swelling   . Edema     to right leg-below knee;not any different than when he saw brackbill in jan 2013  . Bruises easily     takes ASA daily  . Skin cancer     nose  . GERD (gastroesophageal reflux disease)     takes Protonix daily  . Diarrhea   . Constipation   . Enlarged prostate     takes Finasteride daily  . Blood transfusion 2008  . Insomnia   . Hx of CABG     1993  . Ejection fraction     EF 51%, nuclear, 2010    Past Surgical History  Procedure Laterality Date  . Shoulder surgery  2007    left  . Cataract extraction      right  . Abdominoperineal proctocolectomy  06/01/2007  . Coronary angioplasty  05/22/92  . Colostomy  2008  . Colonoscopy    . Coronary artery  bypass graft  1993  . Colon surgery  2008    colectomy  . Knee arthroscopy   2010    right   . Total knee arthroplasty  02/28/2012    Procedure: TOTAL KNEE ARTHROPLASTY;  Surgeon: Raymon Mutton, MD;  Location: [facility] OR;  Service: Orthopedics;  Laterality: Right;    Patient Active Problem List   Diagnosis Date Noted  . Coronary artery disease   . Ejection fraction   . Renal insufficiency 05/05/2012  . BPH (benign prostatic hyperplasia) 09/21/2011  . Hx of CABG 05/27/2011  . Asymptomatic PVCs 05/27/2011  . Personal history of malignant neoplasm of rectum, rectosigmoid junction, and anus 05/14/2011  . GERD (gastroesophageal reflux disease) 05/14/2011  . Hernia of abdominal cavity 05/14/2011  . CARCINOMA, RECTUM 05/08/2009  . HYPERCHOLESTEROLEMIA 05/08/2009  . ANEMIA, IRON DEFICIENCY 05/08/2009  . HYPERTENSION 05/08/2009  . MYOCARDIAL INFARCTION 05/08/2009  . GERD 05/08/2009  . DIVERTICULOSIS, COLON 05/08/2009  . DEGENERATIVE JOINT DISEASE, GENERALIZED 05/08/2009  . COLONIC POLYPS, ADENOMATOUS, HX OF 05/08/2009    ROS   Patient denies fever, chills, headache, sweats, rash, change in vision, change in hearing, chest pain, cough, nausea vomiting, urinary symptoms. All other systems are reviewed and are negative.  PHYSICAL EXAM  Patient is overweight. There is no jugulovenous distention. He is oriented to person time and place. Affect is normal. Lungs are clear. Respiratory effort is nonlabored. Cardiac exam her vitals S1 and S2. The rate is slow. The abdomen is soft. He does have 2+ peripheral edema in the right leg and 1+ in the left leg. There no musculoskeletal deformities. There are no skin rashes.  Filed Vitals:   07/10/13 1438  BP: 138/72  Pulse: 41  Height: 5\' 10"  (1.778 m)  Weight: 230 lb 12.8 oz (104.69 kg)   The quality of the fax EKG is difficult. Repeat EKG today shows that the patient does have atrial fibrillation with a slow ventricular response.  ASSESSMENT & PLAN

## 2013-07-10 NOTE — Assessment & Plan Note (Signed)
Coronary disease is stable. No change in therapy. 

## 2013-07-10 NOTE — Addendum Note (Signed)
Addended by: Iona Coach on: 07/10/2013 03:46 PM   Modules accepted: Orders

## 2013-07-10 NOTE — Patient Instructions (Addendum)
**Note De-Identified  Obfuscation** Your physician has recommended you make the following change in your medication: decrease Toprol XL (Metoprolol) to 25 mg daily and increase Furosemide to 40 mg (2 tablets of your 20 mg tablets) until you see Dr Patty Sermons on Monday.  Please limit your salt and fluid intake  Your physician recommends that you schedule a follow-up appointment in: Monday  Your physician recommends that you have lab work today: BMP

## 2013-07-10 NOTE — Assessment & Plan Note (Signed)
This appears to be new documentation of atrial fibrillation. Clinically we think it was not present in June, but there was no EKG then. The rate is slow. Today I will reduce his metoprolol from 50-25 mg daily. I've chosen not to start anticoagulation but rather wait for that decision by Dr. Patty Sermons. There will be very early followup.

## 2013-07-16 ENCOUNTER — Encounter: Payer: Self-pay | Admitting: Cardiology

## 2013-07-16 ENCOUNTER — Ambulatory Visit (INDEPENDENT_AMBULATORY_CARE_PROVIDER_SITE_OTHER): Payer: Medicare Other | Admitting: Cardiology

## 2013-07-16 VITALS — BP 134/62 | HR 51 | Ht 70.0 in | Wt 235.1 lb

## 2013-07-16 DIAGNOSIS — I2510 Atherosclerotic heart disease of native coronary artery without angina pectoris: Secondary | ICD-10-CM

## 2013-07-16 DIAGNOSIS — I5032 Chronic diastolic (congestive) heart failure: Secondary | ICD-10-CM

## 2013-07-16 DIAGNOSIS — I4891 Unspecified atrial fibrillation: Secondary | ICD-10-CM

## 2013-07-16 DIAGNOSIS — E8770 Fluid overload, unspecified: Secondary | ICD-10-CM

## 2013-07-16 DIAGNOSIS — N289 Disorder of kidney and ureter, unspecified: Secondary | ICD-10-CM

## 2013-07-16 DIAGNOSIS — E8779 Other fluid overload: Secondary | ICD-10-CM

## 2013-07-16 MED ORDER — METOPROLOL SUCCINATE ER 25 MG PO TB24: 25.0000 mg | ORAL_TABLET | Freq: Every day | ORAL | Status: DC

## 2013-07-16 MED ORDER — FUROSEMIDE 40 MG PO TABS: 80.0000 mg | ORAL_TABLET | Freq: Every day | ORAL | Status: DC

## 2013-07-16 NOTE — Progress Notes (Signed)
Nicholas [name] Date of Birth:  1934/06/25 [facility] [address] [place], [place]  [address] [phone]         Fax   [phone]  History of Present Illness: This pleasant 79 year old gentleman is seen for a work in office visit. He has a past history of ischemic heart disease and had coronary artery bypass graft surgery on 05/27/92. He has a past history of compensated congestive heart failure. He had a nuclear stress test in June 2010 showing an old inferior scar but no reversible ischemia and his ejection fraction was 51%.  Recently he has had a problem with weight gain and increasing peripheral edema.  He was seen as a work in by Dr. Myrtis Ser last week and prior to that had been seen by Dr. Jeannetta Nap because of increasing peripheral edema.  Dr. Jeannetta Nap did a chest x-ray which the patient was told was okay.  Dr. Myrtis Ser had him increase his Lasix from 20 mg up to 40 mg daily.  Despite this, and his weight is up 5 more pounds and he is experiencing orthopnea and persistent ankle edema.   Current Outpatient Prescriptions  Medication Sig Dispense Refill  . albuterol (PROVENTIL HFA;VENTOLIN HFA) 108 (90 BASE) MCG/ACT inhaler Inhale 1-2 puffs into the lungs every 6 (six) hours as needed for wheezing.  1 Inhaler  0  . amLODipine-benazepril (LOTREL) 5-20 MG per capsule Take 1 capsule by mouth daily.  30 capsule  3  . aspirin 81 MG tablet Take 81 mg by mouth daily.      . Calcium Carbonate-Vitamin D (CALCIUM 600-D) 600-400 MG-UNIT per tablet Take 1 tablet by mouth 2 (two) times daily.       Marland Kitchen doxazosin (CARDURA) 8 MG tablet Take 8 mg by mouth at bedtime.       . Ezetimibe-Simvastatin (VYTORIN PO) Take by mouth daily. Last dose will be 07/11/13. Dr Patty Sermons changed to Simvastatin.      . finasteride (PROSCAR) 5 MG tablet Take 5 mg by mouth daily.        . furosemide (LASIX) 20 MG tablet Take 2 tablets (40 mg total) by mouth daily.  60 tablet  11  . metoprolol succinate (TOPROL-XL)  25 MG 24 hr tablet Take 1 tablet (25 mg total) by mouth daily.  30 tablet  3  . nitroGLYCERIN (NITROSTAT) 0.4 MG SL tablet Place 1 tablet (0.4 mg total) under the tongue every 5 (five) minutes as needed. For chest pain  25 tablet  6  . pantoprazole (PROTONIX) 40 MG tablet Take 1 tablet (40 mg total) by mouth daily.  30 tablet  0  . potassium chloride (KLOR-CON) 20 MEQ packet Take 20 mEq by mouth every other day.  16 tablet  11  . Saw Palmetto, Serenoa repens, (SAW PALMETTO PO) Take 1 capsule by mouth every evening.      . simvastatin (ZOCOR) 20 MG tablet Take 1 tablet (20 mg total) by mouth at bedtime.  90 tablet  3  . vitamin C (ASCORBIC ACID) 500 MG tablet Take 500 mg by mouth daily.        . Vitamins-Lipotropics (B-100 COMPLEX) TABS Take 1 tablet by mouth daily.        No current facility-administered medications for this visit.    Allergies  Allergen Reactions  . Cephalexin   . Ciprofloxacin Other (See Comments)    Reaction unknown  . Hctz (Hydrochlorothiazide)     DIZZINESS   . Levofloxacin   .  Lipitor (Atorvastatin Calcium)     MUSCLE PAIN  . Niaspan (Niacin Er)     FLUSHING   . Oxycodone-Acetaminophen Other (See Comments)    Reaction unknown  . Sulfonamide Derivatives Other (See Comments)    Reaction unkonwn    Patient Active Problem List   Diagnosis Date Noted  . Hx of CABG 05/27/2011    Priority: High  . Asymptomatic PVCs 05/27/2011    Priority: High  . HYPERTENSION 05/08/2009    Priority: High  . Atrial fibrillation 07/10/2013  . Volume overload 07/10/2013  . Coronary artery disease   . Ejection fraction   . Renal insufficiency 05/05/2012  . BPH (benign prostatic hyperplasia) 09/21/2011  . Personal history of malignant neoplasm of rectum, rectosigmoid junction, and anus 05/14/2011  . GERD (gastroesophageal reflux disease) 05/14/2011  . Hernia of abdominal cavity 05/14/2011  . CARCINOMA, RECTUM 05/08/2009  . HYPERCHOLESTEROLEMIA 05/08/2009  . ANEMIA, IRON  DEFICIENCY 05/08/2009  . MYOCARDIAL INFARCTION 05/08/2009  . GERD 05/08/2009  . DIVERTICULOSIS, COLON 05/08/2009  . DEGENERATIVE JOINT DISEASE, GENERALIZED 05/08/2009  . COLONIC POLYPS, ADENOMATOUS, HX OF 05/08/2009    History  Smoking status  . Former Smoker -- 10 years  . Types: Pipe, Cigars  . Quit date: 05/26/1991  Smokeless tobacco  . Current User    History  Alcohol Use No    Family History  Problem Relation Age of Onset  . Pancreatic cancer Father   . Diabetes Son   . Emphysema Mother   . Anesthesia problems Neg Hx   . Hypotension Neg Hx   . Malignant hyperthermia Neg Hx   . Pseudochol deficiency Neg Hx     Review of Systems: Constitutional: no fever chills diaphoresis or fatigue or change in weight.  Head and neck: no hearing loss, no epistaxis, no photophobia or visual disturbance. Respiratory: No cough, shortness of breath or wheezing. Cardiovascular: No chest pain peripheral edema, palpitations. Gastrointestinal: No abdominal distention, no abdominal pain, no change in bowel habits hematochezia or melena. Genitourinary: No dysuria, no frequency, no urgency, no nocturia. Musculoskeletal:No arthralgias, no back pain, no gait disturbance or myalgias. Neurological: No dizziness, no headaches, no numbness, no seizures, no syncope, no weakness, no tremors. Hematologic: No lymphadenopathy, no easy bruising. Psychiatric: No confusion, no hallucinations, no sleep disturbance.    Physical Exam: There were no vitals filed for this visit.  The general appearance reveals a well-developed well-nourished gentleman in no distress.The head and neck exam reveals pupils equal and reactive.  Extraocular movements are full.  There is no scleral icterus.  The mouth and pharynx are normal.  The neck is supple.  The carotids reveal no bruits.  The jugular venous pressure is normal.  The  thyroid is not enlarged.  There is no lymphadenopathy.  The chest is clear to percussion and  auscultation.  There are no rales or rhonchi.  Expansion of the chest is symmetrical.  The precordium is quiet.  The first heart sound is normal.  The second heart sound is physiologically split.  There is no murmur gallop rub or click.  There is no abnormal lift or heave.  The abdomen is soft and nontender.  There is a colostomy in left lower quadrant.  The bowel sounds are normal.  The liver and spleen are not enlarged.  There are no abdominal masses.  There are no abdominal bruits.  Extremities reveal good pedal pulses.  There is marked bilateral pretibial and pedal edema.  There is no cyanosis or clubbing.  Strength is normal and symmetrical in all extremities.  There is no lateralizing weakness.  There are no sensory deficits.  The skin is warm and dry.  There is no rash.   Assessment / Plan: The patient will return soon for a 2-D echo.  We are increasing Lasix 80 mg daily.  Continue low-dose Toprol 25 mg daily.  Recheck in 2 weeks for office visit and basal metabolic panel.

## 2013-07-16 NOTE — Patient Instructions (Addendum)
Your physician has requested that you have an echocardiogram. Echocardiography is a painless test that uses sound waves to create images of your heart. It provides your doctor with information about the size and shape of your heart and how well your heart's chambers and valves are working. This procedure takes approximately one hour. There are no restrictions for this procedure.  Your physician recommends that you schedule a follow-up appointment in: 2 weeks  With Dr Patty Sermons  Your physician recommends that you return for lab work in: 2 weeks// bmet

## 2013-07-16 NOTE — Assessment & Plan Note (Signed)
The patient has not responded to Lasix 40 mg daily.  We will increase his Lasix to 80 mg daily.  Computer's were down today and we did not obtain any lab work today.  We will have him return in 2 weeks for office visit and basal metabolic panel.  In the meantime we will also arrange for a two-dimensional echocardiogram to evaluate left ventricular systolic and diastolic function.  He will continue on a reduced dose of Toprol 25 mg daily.

## 2013-07-16 NOTE — Assessment & Plan Note (Signed)
Blood pressure remained stable on current medication.

## 2013-07-16 NOTE — Assessment & Plan Note (Signed)
The patient has not been experiencing any chest discomfort or angina pectoris

## 2013-07-23 ENCOUNTER — Other Ambulatory Visit: Payer: Self-pay | Admitting: *Deleted

## 2013-07-23 MED ORDER — PANTOPRAZOLE SODIUM 40 MG PO TBEC: 40.0000 mg | DELAYED_RELEASE_TABLET | Freq: Every day | ORAL | Status: DC

## 2013-07-31 ENCOUNTER — Other Ambulatory Visit (INDEPENDENT_AMBULATORY_CARE_PROVIDER_SITE_OTHER): Payer: Medicare Other

## 2013-07-31 ENCOUNTER — Ambulatory Visit (HOSPITAL_COMMUNITY): Payer: Medicare Other | Attending: Cardiology

## 2013-07-31 ENCOUNTER — Other Ambulatory Visit (HOSPITAL_COMMUNITY): Payer: Medicare Other

## 2013-07-31 DIAGNOSIS — I379 Nonrheumatic pulmonary valve disorder, unspecified: Secondary | ICD-10-CM | POA: Insufficient documentation

## 2013-07-31 DIAGNOSIS — I5032 Chronic diastolic (congestive) heart failure: Secondary | ICD-10-CM

## 2013-07-31 DIAGNOSIS — I080 Rheumatic disorders of both mitral and aortic valves: Secondary | ICD-10-CM | POA: Insufficient documentation

## 2013-07-31 DIAGNOSIS — I509 Heart failure, unspecified: Secondary | ICD-10-CM

## 2013-07-31 LAB — BASIC METABOLIC PANEL
CO2: 27 mEq/L (ref 19–32)
Chloride: 105 mEq/L (ref 96–112)
Creatinine, Ser: 2.1 mg/dL — ABNORMAL HIGH (ref 0.4–1.5)
Potassium: 3.9 mEq/L (ref 3.5–5.1)
Sodium: 140 mEq/L (ref 135–145)

## 2013-07-31 NOTE — Progress Notes (Signed)
Echocardiogram performed.  

## 2013-07-31 NOTE — Progress Notes (Signed)
Quick Note:  Please report to patient. The recent labs are stable. Continue same medication and careful diet. ______ 

## 2013-08-06 ENCOUNTER — Encounter: Payer: Self-pay | Admitting: Cardiology

## 2013-08-06 ENCOUNTER — Ambulatory Visit (INDEPENDENT_AMBULATORY_CARE_PROVIDER_SITE_OTHER): Payer: Medicare Other | Admitting: Cardiology

## 2013-08-06 VITALS — BP 144/68 | HR 56 | Ht 70.0 in | Wt 229.4 lb

## 2013-08-06 DIAGNOSIS — I2510 Atherosclerotic heart disease of native coronary artery without angina pectoris: Secondary | ICD-10-CM

## 2013-08-06 DIAGNOSIS — E8779 Other fluid overload: Secondary | ICD-10-CM

## 2013-08-06 DIAGNOSIS — I4891 Unspecified atrial fibrillation: Secondary | ICD-10-CM

## 2013-08-06 DIAGNOSIS — E8770 Fluid overload, unspecified: Secondary | ICD-10-CM

## 2013-08-06 MED ORDER — RIVAROXABAN 15 MG PO TABS: 15.0000 mg | ORAL_TABLET | Freq: Every day | ORAL | Status: DC

## 2013-08-06 MED ORDER — AMLODIPINE BESY-BENAZEPRIL HCL 5-20 MG PO CAPS: 1.0000 | ORAL_CAPSULE | Freq: Every day | ORAL | Status: DC

## 2013-08-06 NOTE — Assessment & Plan Note (Addendum)
The patient is in atrial fibrillation.  This is a new rhythm since 2013.  He is not currently on anticoagulation but should be.  He is not having any evidence of GI bleeding.  He does have a past history of anemia and is presently on iron therapy.  He does have chronic renal insufficiency.  To prevent embolic stroke we will put him on Xarelto 15 mg daily.  Once he starts  Xarelto he will stop his daily aspirin.

## 2013-08-06 NOTE — Assessment & Plan Note (Signed)
His breathing is improved and is less dyspneic.  His weight is down 6 pounds since last visit.  He is now on Lasix 80 mg daily.  Recent basal metabolic panel showed that his renal function is stable with creatinine 2.1

## 2013-08-06 NOTE — Patient Instructions (Addendum)
START XARELTO 15 MG DAILY  STOP ASPIRIN   Your physician recommends that you schedule a follow-up appointment in: 2 MONTH OV/BMET/CBC/EKG

## 2013-08-06 NOTE — Progress Notes (Signed)
Nicholas [name] Date of Birth:  Mar 25, 1934 [facility] [address] [place], [place]  [address] [phone]         Fax   [phone]  History of Present Illness: This pleasant 79 year old gentleman is seen for a followup office visit. He has a past history of ischemic heart disease and had coronary artery bypass graft surgery on 05/27/92. He has a past history of compensated congestive heart failure. He had a nuclear stress test in June 2010 showing an old inferior scar but no reversible ischemia and his ejection fraction was 51%. Recently he has had a problem with weight gain and increasing peripheral edema. He was seen as a work in by Dr. Myrtis Ser in early July 2014 and prior to that had been seen by Dr. Jeannetta Nap because of increasing peripheral edema. Dr. Jeannetta Nap did a chest x-ray which the patient was told was okay. Dr. Myrtis Ser had him increase his Lasix from 20 mg up to 40 mg daily. Despite this, and his weight is up 5 more pounds and he is experiencing orthopnea and persistent ankle edema.  When Dr. Myrtis Ser saw him his EKG showed atrial fibrillation which was new since 2013.   Current Outpatient Prescriptions  Medication Sig Dispense Refill  . amLODipine-benazepril (LOTREL) 5-20 MG per capsule Take 1 capsule by mouth daily.  30 capsule  11  . Calcium Carbonate-Vitamin D (CALCIUM 600-D) 600-400 MG-UNIT per tablet Take 1 tablet by mouth 2 (two) times daily.       Marland Kitchen doxazosin (CARDURA) 8 MG tablet Take 8 mg by mouth at bedtime.       . ferrous sulfate 325 (65 FE) MG tablet Take 325 mg by mouth daily with breakfast.      . finasteride (PROSCAR) 5 MG tablet Take 5 mg by mouth daily.        . furosemide (LASIX) 40 MG tablet Take 2 tablets (80 mg total) by mouth daily.  60 tablet  11  . metoprolol succinate (TOPROL-XL) 25 MG 24 hr tablet Take 1 tablet (25 mg total) by mouth daily.  30 tablet  5  . nitroGLYCERIN (NITROSTAT) 0.4 MG SL tablet Place 1 tablet (0.4 mg total) under the  tongue every 5 (five) minutes as needed. For chest pain  25 tablet  6  . pantoprazole (PROTONIX) 40 MG tablet Take 1 tablet (40 mg total) by mouth daily.  30 tablet  0  . potassium chloride (KLOR-CON) 20 MEQ packet Take 20 mEq by mouth every other day.  16 tablet  11  . Saw Palmetto, Serenoa repens, (SAW PALMETTO PO) Take 1 capsule by mouth every evening.      . simvastatin (ZOCOR) 20 MG tablet Take 1 tablet (20 mg total) by mouth at bedtime.  90 tablet  3  . vitamin C (ASCORBIC ACID) 500 MG tablet Take 500 mg by mouth daily.        . Vitamins-Lipotropics (B-100 COMPLEX) TABS Take 1 tablet by mouth daily.       . Rivaroxaban (XARELTO) 15 MG TABS tablet Take 1 tablet (15 mg total) by mouth daily with supper.  30 tablet  5   No current facility-administered medications for this visit.    Allergies  Allergen Reactions  . Cephalexin   . Ciprofloxacin Other (See Comments)    Reaction unknown  . Hctz (Hydrochlorothiazide)     DIZZINESS   . Levofloxacin   . Lipitor (Atorvastatin Calcium)     MUSCLE PAIN  .  Niaspan (Niacin Er)     FLUSHING   . Oxycodone-Acetaminophen Other (See Comments)    Reaction unknown  . Sulfonamide Derivatives Other (See Comments)    Reaction unkonwn    Patient Active Problem List   Diagnosis Date Noted  . Hx of CABG 05/27/2011    Priority: High  . Asymptomatic PVCs 05/27/2011    Priority: High  . HYPERTENSION 05/08/2009    Priority: High  . Atrial fibrillation 07/10/2013  . Volume overload 07/10/2013  . Coronary artery disease   . Ejection fraction   . Renal insufficiency 05/05/2012  . BPH (benign prostatic hyperplasia) 09/21/2011  . Personal history of malignant neoplasm of rectum, rectosigmoid junction, and anus 05/14/2011  . GERD (gastroesophageal reflux disease) 05/14/2011  . Hernia of abdominal cavity 05/14/2011  . CARCINOMA, RECTUM 05/08/2009  . HYPERCHOLESTEROLEMIA 05/08/2009  . ANEMIA, IRON DEFICIENCY 05/08/2009  . MYOCARDIAL INFARCTION  05/08/2009  . GERD 05/08/2009  . DIVERTICULOSIS, COLON 05/08/2009  . DEGENERATIVE JOINT DISEASE, GENERALIZED 05/08/2009  . COLONIC POLYPS, ADENOMATOUS, HX OF 05/08/2009    History  Smoking status  . Former Smoker -- 10 years  . Types: Pipe, Cigars  . Quit date: 05/26/1991  Smokeless tobacco  . Current User    History  Alcohol Use No    Family History  Problem Relation Age of Onset  . Pancreatic cancer Father   . Diabetes Son   . Emphysema Mother   . Anesthesia problems Neg Hx   . Hypotension Neg Hx   . Malignant hyperthermia Neg Hx   . Pseudochol deficiency Neg Hx     Review of Systems: Constitutional: no fever chills diaphoresis or fatigue or change in weight.  Head and neck: no hearing loss, no epistaxis, no photophobia or visual disturbance. Respiratory: No cough, shortness of breath or wheezing. Cardiovascular: No chest pain peripheral edema, palpitations. Gastrointestinal: No abdominal distention, no abdominal pain, no change in bowel habits hematochezia or melena. Genitourinary: No dysuria, no frequency, no urgency, no nocturia. Musculoskeletal:No arthralgias, no back pain, no gait disturbance or myalgias. Neurological: No dizziness, no headaches, no numbness, no seizures, no syncope, no weakness, no tremors. Hematologic: No lymphadenopathy, no easy bruising. Psychiatric: No confusion, no hallucinations, no sleep disturbance.    Physical Exam: Filed Vitals:   08/06/13 1058  BP: 144/68  Pulse: 56   the general appearance reveals a large gentleman in no acute distress.  His weight is down 6 pounds.The head and neck exam reveals pupils equal and reactive.  Extraocular movements are full.  There is no scleral icterus.  The mouth and pharynx are normal.  The neck is supple.  The carotids reveal no bruits.  The jugular venous pressure is normal.  The  thyroid is not enlarged.  There is no lymphadenopathy.  The chest is clear to percussion and auscultation.  There are  no rales or rhonchi.  Expansion of the chest is symmetrical.  The precordium is quiet.  The pulse is irregularly irregular in atrial fibrillation The first heart sound is normal.  The second heart sound is physiologically split.  There is no murmur gallop rub or click.  There is no abnormal lift or heave.  The abdomen is soft and nontender.  The bowel sounds are normal.  The liver and spleen are not enlarged.  There are no abdominal masses.  There are no abdominal bruits.  Extremities reveal good pedal pulses.  There is severe dependent bilateral peripheral edema which is chronic but which goes down at  night.  There is no cyanosis or clubbing.  Strength is normal and symmetrical in all extremities.  There is no lateralizing weakness.  There are no sensory deficits.  The skin is warm and dry.  There is no rash.    Assessment / Plan: For treatment of his atrial fibrillation we are starting the Xarelto 15 mg daily which is the renal dose for him. Recheck in 2 months for office visit CBC basal metabolic panel and EKG. Stop aspirin once he is on Xarelto.

## 2013-08-06 NOTE — Assessment & Plan Note (Signed)
Patient has not had any recurrent chest pain or angina pectoris

## 2013-08-09 ENCOUNTER — Encounter: Payer: Self-pay | Admitting: Gastroenterology

## 2013-08-09 ENCOUNTER — Ambulatory Visit (INDEPENDENT_AMBULATORY_CARE_PROVIDER_SITE_OTHER): Payer: Medicare Other | Admitting: Gastroenterology

## 2013-08-09 VITALS — BP 140/58 | HR 56 | Ht 70.0 in | Wt 229.0 lb

## 2013-08-09 DIAGNOSIS — Z9889 Other specified postprocedural states: Secondary | ICD-10-CM

## 2013-08-09 DIAGNOSIS — Z9049 Acquired absence of other specified parts of digestive tract: Secondary | ICD-10-CM

## 2013-08-09 DIAGNOSIS — K219 Gastro-esophageal reflux disease without esophagitis: Secondary | ICD-10-CM

## 2013-08-09 DIAGNOSIS — Z85038 Personal history of other malignant neoplasm of large intestine: Secondary | ICD-10-CM

## 2013-08-09 DIAGNOSIS — Z7901 Long term (current) use of anticoagulants: Secondary | ICD-10-CM

## 2013-08-09 MED ORDER — PANTOPRAZOLE SODIUM 40 MG PO TBEC: 40.0000 mg | DELAYED_RELEASE_TABLET | Freq: Every day | ORAL | Status: DC

## 2013-08-09 NOTE — Patient Instructions (Signed)
  New prescription for Protonix was sent to your pharmacy  Please follow up with Dr. Jarold Motto in one year _____________________________________________                                               We are excited to introduce MyChart, a new best-in-class service that provides you online access to important information in your electronic medical record. We want to make it easier for you to view your health information - all in one secure location - when and where you need it. We expect MyChart will enhance the quality of care and service we provide.  When you register for MyChart, you can:    View your test results.    Request appointments and receive appointment reminders via email.    Request medication renewals.    View your medical history, allergies, medications and immunizations.    Communicate with your physician's office through a password-protected site.    Conveniently print information such as your medication lists.  To find out if MyChart is right for you, please talk to a member of our clinical staff today. We will gladly answer your questions about this free health and wellness tool.  If you are age 77 or older and want a member of your family to have access to your record, you must provide written consent by completing a proxy form available at our office. Please speak to our clinical staff about guidelines regarding accounts for patients younger than age 77.  As you activate your MyChart account and need any technical assistance, please call the MyChart technical support line at [phone] [phone]) or email your question to [email or web address]. If you email your question(s), please include your name, a return phone number and the best time to reach you.  If you have non-urgent health-related questions, you can send a message to our office through MyChart at [email or web address]. If you have a medical emergency, call 911.  Thank you for using MyChart as  your new health and wellness resource!   MyChart licensed from [employer],  4540-9811. Patents Pending.

## 2013-08-09 NOTE — Progress Notes (Signed)
History of Present Illness: This is a chronically ill and fragile 79 year old Caucasian male with multiple cardiovascular issues currently on Xarelto and multiple cardiac medications with hypertensive cardiovascular disease, coronary artery disease, atrial fibrillation, chronic CHF.  He is status post abdominal perineal resection for T3,No,Mo   rectal carcinoma in 2008 with subsequent negative colonoscopies.  He has chronic GERD which is managed with Protonix 40 mg a day.  He currently is asymptomatic and denies chest pain or dysphagia, lower GI or hepatobiliary complaints.  He does have a colostomy and a peristomal hernia, and has periodic traumatic bleeding likely from his colostomy.  He is up-to-date on his colonoscopy.  He recently was placed on over-the-counter iron by Dr. Windle Guard, and is apparently having his CBC and iron studies followed by Dr. Jeannetta Nap.    Current Medications, Allergies, Past Medical History, Past Surgical History, Family History and Social History were reviewed in [employer] record.  ROS: All systems were reviewed and are negative unless otherwise stated in the HPI.         Assessment and plan: His GERD is doing well on daily PPI, and I've renewed his prescription.  With his cardiovascular issues and recent bout of worsening CHF, I do not think we need to do any endoscopic procedures at this time.  He is having no dysphagia, and I have her renewed his Protonix prescription.  I will see him again in one years time or when necessary as needed.  Apparently he is going to see Dr. Mancel Bale in oncology for his routine scheduled oncology followup.  CT scan in April of 2012 showed a peristomal hernia, but otherwise was unremarkable.  I did not repeat any labs today.  Please copy her primary care physician, referring physician, and pertinent subspecialists.

## 2013-08-09 NOTE — Addendum Note (Signed)
Addended by: Ok Anis A on: 08/09/2013 11:16 AM   Modules accepted: Orders

## 2013-08-15 ENCOUNTER — Telehealth: Payer: Self-pay | Admitting: Gastroenterology

## 2013-08-15 NOTE — Telephone Encounter (Signed)
Assessment and plan: His GERD is doing well on daily PPI, and I've renewed his prescription. With his cardiovascular issues and recent bout of worsening CHF, I do not think we need to do any endoscopic procedures at this time. He is having no dysphagia, and I have her renewed his Protonix prescription. I will see him again in one years time or when necessary as needed. Apparently he is going to see Dr. Mancel Bale in oncology for his routine scheduled oncology followup. CT scan in April of 2012 showed a peristomal hernia, but otherwise was unremarkable. I did not repeat any labs today.    Informed pt of Dr Norval Gable OV notes as listed above. He states once in a while he eats too fast and has trouble swallowing; he will attempt to take smaller bites, chew well and concentrate on eating more slowly. Pt will call if interventions don't work.

## 2013-09-24 ENCOUNTER — Telehealth: Payer: Self-pay | Admitting: *Deleted

## 2013-09-24 NOTE — Telephone Encounter (Signed)
Tammy from Dr Jeannetta Nap' ofc called to report pt's HGB is slowly dropping. Last week he was 9.9, but today he is 9.8. He also had a + stool for blood via his stoma, but pt states it may not be accurate because his stoma bleeds at times. Dr Jeannetta Nap has referred pt to [place].. Please advise. Thanks.

## 2013-09-24 NOTE — Telephone Encounter (Signed)
Message copied by Florene Glen on Mon Sep 24, 2013  2:03 PM ------      Message from: Park Liter L      Created: Mon Sep 24, 2013  1:42 PM       Tammy from Dr. Jeannetta Nap office called wanting to speak to you. Call her at [phone].  ------

## 2013-09-25 NOTE — Telephone Encounter (Signed)
Routed last office note to Dr Jeannetta Nap.

## 2013-09-25 NOTE — Telephone Encounter (Signed)
Oral iron po chronically..Sendu. Dr. Jeannetta Nap a copy of my last clinic note!!!

## 2013-10-16 ENCOUNTER — Encounter: Payer: Self-pay | Admitting: Gastroenterology

## 2013-10-16 ENCOUNTER — Ambulatory Visit (INDEPENDENT_AMBULATORY_CARE_PROVIDER_SITE_OTHER): Payer: Medicare Other | Admitting: Gastroenterology

## 2013-10-16 ENCOUNTER — Other Ambulatory Visit (INDEPENDENT_AMBULATORY_CARE_PROVIDER_SITE_OTHER): Payer: Medicare Other

## 2013-10-16 ENCOUNTER — Other Ambulatory Visit: Payer: Self-pay | Admitting: *Deleted

## 2013-10-16 VITALS — BP 114/60 | HR 72 | Ht 70.0 in | Wt 202.8 lb

## 2013-10-16 DIAGNOSIS — R634 Abnormal weight loss: Secondary | ICD-10-CM

## 2013-10-16 DIAGNOSIS — C20 Malignant neoplasm of rectum: Secondary | ICD-10-CM

## 2013-10-16 DIAGNOSIS — Z79899 Other long term (current) drug therapy: Secondary | ICD-10-CM

## 2013-10-16 DIAGNOSIS — IMO0002 Reserved for concepts with insufficient information to code with codable children: Secondary | ICD-10-CM

## 2013-10-16 DIAGNOSIS — I509 Heart failure, unspecified: Secondary | ICD-10-CM

## 2013-10-16 DIAGNOSIS — D649 Anemia, unspecified: Secondary | ICD-10-CM

## 2013-10-16 DIAGNOSIS — K219 Gastro-esophageal reflux disease without esophagitis: Secondary | ICD-10-CM

## 2013-10-16 DIAGNOSIS — Z85038 Personal history of other malignant neoplasm of large intestine: Secondary | ICD-10-CM

## 2013-10-16 DIAGNOSIS — Z7901 Long term (current) use of anticoagulants: Secondary | ICD-10-CM

## 2013-10-16 LAB — FOLATE: Folate: 24.6 ng/mL (ref >5.9)

## 2013-10-16 NOTE — Progress Notes (Signed)
This is a 79 year old Caucasian male who has a chronically guaiac positive stool because of a chronically bleeding colostomy site which is a result of partial colectomy for colon cancer many years ago.  He is still followed by Dr. Truett Perna in oncology routinely. He is up-to-date on his endoscopy and colonoscopies.  He is on iron therapy intermittently per DR. Jeannetta Nap..  Hemoglobin is currently 10 ,does not appear to be hypochromic indices.. he is on daily Protonix 40 mg and does take a variety of NSAIDs.  He has a history of chronic GERD.  He also takes n Xarelto 15 milligrams a day per Dr. Patty Sermons in cardiology.  He has had 4 colonoscopies over the last 6 years, last performed in June of 2012.  He denies melena, hematemesis, and only blood that he sees his with daily removal of his colostomy.Marland Kitchen  He is followed by cardiology because of atriib and is on variety of cardiac medications for congestive heart failure,and has had a recent weights 20 pound weight loss.  He does suffer from a large peristomal abdominal wall hernia.   urrent Medications, Allergies, Past Medical History, Past Surgical History, Family History and Social History were reviewed in [employer] record.  ROS: All systems were reviewed and are negative unless otherwise stated in the HPI.          Physical Exam: Healthy nontoxic appearing patient in no distress.  Low pressure 114/60, pulse 72 and regular and weight 202 with a BMI of 29.1.  His abdomen is performed because of a large peristomal hernia with his colostomy site in the left upper quadrant.  I did not remove the colostomy padding today for exam.  There no other abdominal masses or tenderness.  He is in not in congestive heart failure today.    Assessment and Plan: This patient does not have recurrent colon cancer.  He has bleeding probably from a combination of causes, ie. NSAID gastritis, trauma from his colostomy equipment, and his chronic  anticoagulation.  He should be chronically on po iron therapy, daily PPI, and followed by primary care.  I do not think he needs to see GI every time he has a guaiac positive stool, and I do not think this is a valid test in his case.  I have ordered a Cologuard DNA tests for stool to confirm the fact that he is cancer free.  I really see no benefit of proceeding with any further endoscopy or colonoscopy.  I did order iron studies today so we can give him some guidance about his oral iron therapy.  Again, I would not be checking stool Hemoccult cards regularly.  He appears to be improved in terms of his congestive heart failure and cardiac problems.  I've asked him to avoid NSAIDs if at all possible.      Please copy her primary care physician, referring physician, and pertinent subspecialists.

## 2013-10-16 NOTE — Patient Instructions (Signed)
Your physician has requested that you go to the basement for the following lab work before leaving today: We have given you information on Cologuard, the company will contact you directly.  Follow the instructions they give you and be sure to get your sample from your colostomy. CC:  Windle Guard MD

## 2013-10-17 ENCOUNTER — Encounter: Payer: Self-pay | Admitting: Cardiology

## 2013-10-17 ENCOUNTER — Other Ambulatory Visit: Payer: 59 | Admitting: Lab

## 2013-10-17 ENCOUNTER — Ambulatory Visit (INDEPENDENT_AMBULATORY_CARE_PROVIDER_SITE_OTHER): Payer: Medicare Other | Admitting: Cardiology

## 2013-10-17 VITALS — BP 140/66 | HR 74 | Ht 70.0 in | Wt 201.0 lb

## 2013-10-17 DIAGNOSIS — I493 Ventricular premature depolarization: Secondary | ICD-10-CM

## 2013-10-17 DIAGNOSIS — D509 Iron deficiency anemia, unspecified: Secondary | ICD-10-CM

## 2013-10-17 DIAGNOSIS — C20 Malignant neoplasm of rectum: Secondary | ICD-10-CM

## 2013-10-17 DIAGNOSIS — I5032 Chronic diastolic (congestive) heart failure: Secondary | ICD-10-CM

## 2013-10-17 DIAGNOSIS — I4891 Unspecified atrial fibrillation: Secondary | ICD-10-CM

## 2013-10-17 DIAGNOSIS — I2510 Atherosclerotic heart disease of native coronary artery without angina pectoris: Secondary | ICD-10-CM

## 2013-10-17 DIAGNOSIS — I4949 Other premature depolarization: Secondary | ICD-10-CM

## 2013-10-17 NOTE — Assessment & Plan Note (Signed)
The patient is not having any symptoms from his atrial fibrillation.  He is on Xarelto.  He has not had any TIA symptoms.

## 2013-10-17 NOTE — Assessment & Plan Note (Signed)
The patient has not been experiencing any chest pain or angina pectoris. 

## 2013-10-17 NOTE — Patient Instructions (Signed)
DECREASE YOUR LASIX (FUROSEMIDE) TO 40 MG DAILY  Your physician wants you to follow-up in: 3 MONTH OV/BMET/CBC You will receive a reminder letter in the mail two months in advance. If you don't receive a letter, please call our office to schedule the follow-up appointment.

## 2013-10-17 NOTE — Progress Notes (Signed)
Nicholas [name] Date of Birth:  26-Jan-1934 [address] [place], [place]  [address] [phone]         Fax   [phone]  History of Present Illness: This pleasant 79 year old gentleman is seen for a followup office visit. He has a past history of ischemic heart disease and had coronary artery bypass graft surgery on 05/27/92. He has a past history of compensated congestive heart failure. He had a nuclear stress test in June 2010 showing an old inferior scar but no reversible ischemia and his ejection fraction was 51%. Recently he has had a problem with weight gain and increasing peripheral edema. He was seen as a work in by Dr. Myrtis Ser in early July 2014 and prior to that had been seen by Dr. Jeannetta Nap because of increasing peripheral edema. Dr. Jeannetta Nap did a chest x-ray which the patient was told was okay. Dr. Myrtis Ser had him increase his Lasix from 20 mg up to 40 mg daily.   When Dr. Myrtis Ser saw him his EKG showed atrial fibrillation which was new since 2013.  His most recent echocardiogram on 07/31/13 showed normal systolic function with ejection fraction 55% and there was inferior wall akinesis.  There was biatrial enlargement.  There was mild to moderate mitral regurgitation and his pulmonary artery pressure was elevated at 46-50 mm mercury.  His last visit he has diuresed well and has lost 28 pounds.  He had been on 80 mg of Lasix initially and is now back to 40 mg daily.  He has had some problems with iron deficiency anemia and has seen Dr. Sheryn Bison.  He is taking oral iron therapy.   Current Outpatient Prescriptions  Medication Sig Dispense Refill  . amLODipine-benazepril (LOTREL) 5-20 MG per capsule Take 1 capsule by mouth daily.  30 capsule  11  . Calcium Carbonate-Vitamin D (CALCIUM 600-D) 600-400 MG-UNIT per tablet Take 1 tablet by mouth 2 (two) times daily.       Marland Kitchen doxazosin (CARDURA) 8 MG tablet Take 8 mg by mouth at bedtime.       . ferrous sulfate 325 (65 FE) MG tablet  Take 325 mg by mouth daily with breakfast.      . finasteride (PROSCAR) 5 MG tablet Take 5 mg by mouth daily.        . furosemide (LASIX) 40 MG tablet Take 40 mg by mouth daily.      . metoprolol succinate (TOPROL-XL) 25 MG 24 hr tablet Take 1 tablet (25 mg total) by mouth daily.  30 tablet  5  . nitroGLYCERIN (NITROSTAT) 0.4 MG SL tablet Place 1 tablet (0.4 mg total) under the tongue every 5 (five) minutes as needed. For chest pain  25 tablet  6  . pantoprazole (PROTONIX) 40 MG tablet Take 1 tablet (40 mg total) by mouth daily.  30 tablet  11  . potassium chloride (KLOR-CON) 20 MEQ packet Take 20 mEq by mouth every other day.  16 tablet  11  . potassium chloride SA (K-DUR,KLOR-CON) 20 MEQ tablet       . Rivaroxaban (XARELTO) 15 MG TABS tablet Take 1 tablet (15 mg total) by mouth daily with supper.  30 tablet  5  . Saw Palmetto, Serenoa repens, (SAW PALMETTO PO) Take 1 capsule by mouth every evening.      . simvastatin (ZOCOR) 20 MG tablet Take 1 tablet (20 mg total) by mouth at bedtime.  90 tablet  3  . vitamin C (ASCORBIC ACID)  500 MG tablet Take 500 mg by mouth daily.        . Vitamins-Lipotropics (B-100 COMPLEX) TABS Take 1 tablet by mouth daily.        No current facility-administered medications for this visit.    Allergies  Allergen Reactions  . Cephalexin   . Ciprofloxacin Other (See Comments)    Reaction unknown  . Hctz [Hydrochlorothiazide]     DIZZINESS   . Levofloxacin   . Lipitor [Atorvastatin Calcium]     MUSCLE PAIN  . Niaspan [Niacin Er]     FLUSHING   . Oxycodone-Acetaminophen Other (See Comments)    Reaction unknown  . Sulfonamide Derivatives Other (See Comments)    Reaction unkonwn    Patient Active Problem List   Diagnosis Date Noted  . Hx of CABG 05/27/2011    Priority: High  . Asymptomatic PVCs 05/27/2011    Priority: High  . HYPERTENSION 05/08/2009    Priority: High  . Atrial fibrillation 07/10/2013  . Volume overload 07/10/2013  . Coronary artery  disease   . Ejection fraction   . Renal insufficiency 05/05/2012  . BPH (benign prostatic hyperplasia) 09/21/2011  . Personal history of malignant neoplasm of rectum, rectosigmoid junction, and anus 05/14/2011  . GERD (gastroesophageal reflux disease) 05/14/2011  . Hernia of abdominal cavity 05/14/2011  . CARCINOMA, RECTUM 05/08/2009  . HYPERCHOLESTEROLEMIA 05/08/2009  . ANEMIA, IRON DEFICIENCY 05/08/2009  . MYOCARDIAL INFARCTION 05/08/2009  . GERD 05/08/2009  . DIVERTICULOSIS, COLON 05/08/2009  . DEGENERATIVE JOINT DISEASE, GENERALIZED 05/08/2009  . COLONIC POLYPS, ADENOMATOUS, HX OF 05/08/2009    History  Smoking status  . Former Smoker -- 10 years  . Types: Pipe, Cigars  . Quit date: 05/26/1991  Smokeless tobacco  . Current User    History  Alcohol Use No    Family History  Problem Relation Age of Onset  . Pancreatic cancer Father   . Diabetes Son   . Emphysema Mother   . Anesthesia problems Neg Hx   . Hypotension Neg Hx   . Malignant hyperthermia Neg Hx   . Pseudochol deficiency Neg Hx     Review of Systems: Constitutional: no fever chills diaphoresis or fatigue or change in weight.  Head and neck: no hearing loss, no epistaxis, no photophobia or visual disturbance. Respiratory: No cough, shortness of breath or wheezing. Cardiovascular: No chest pain peripheral edema, palpitations. Gastrointestinal: No abdominal distention, no abdominal pain, no change in bowel habits hematochezia or melena. Genitourinary: No dysuria, no frequency, no urgency, no nocturia. Musculoskeletal:No arthralgias, no back pain, no gait disturbance or myalgias. Neurological: No dizziness, no headaches, no numbness, no seizures, no syncope, no weakness, no tremors. Hematologic: No lymphadenopathy, no easy bruising. Psychiatric: No confusion, no hallucinations, no sleep disturbance.    Physical Exam: Filed Vitals:   10/17/13 1153  BP: 140/66  Pulse: 74   the general appearance  reveals a large gentleman in no acute distress.  His weight is down 6 pounds.The head and neck exam reveals pupils equal and reactive.  Extraocular movements are full.  There is no scleral icterus.  The mouth and pharynx are normal.  The neck is supple.  The carotids reveal no bruits.  The jugular venous pressure is normal.  The  thyroid is not enlarged.  There is no lymphadenopathy.  The chest is clear to percussion and auscultation.  There are no rales or rhonchi.  Expansion of the chest is symmetrical.  The precordium is quiet.  The pulse is irregularly  irregular in atrial fibrillation The first heart sound is normal.  The second heart sound is physiologically split.  There is no murmur gallop rub or click.  There is no abnormal lift or heave.  The abdomen is soft and nontender.  The bowel sounds are normal.  The liver and spleen are not enlarged.  There are no abdominal masses.  There are no abdominal bruits.  Extremities reveal good pedal pulses.  There is severe dependent bilateral peripheral edema which is chronic but which goes down at night.  There is no cyanosis or clubbing.  Strength is normal and symmetrical in all extremities.  There is no lateralizing weakness.  There are no sensory deficits.  The skin is warm and dry.  There is no rash.  EKG today shows atrial fibrillation with controlled ventricular response.  PVCs are present.  Assessment / Plan: The patient appears to be euvolemic from the standpoint of his chronic diastolic congestive heart failure.  He will continue Lasix 40 mg once a day.  He will be rechecked here in 3 months for followup office visit basal metabolic panel and CBC.  He has an appointment soon to see his new oncologist Dr. Truett Perna.

## 2013-10-17 NOTE — Assessment & Plan Note (Signed)
The patient has felt better since starting oral iron.  He saw Dr. Sheryn Bison yesterday who felt that the patient did not need any further invasive GI workup such as colonoscopy.  The patient does have known chronic low-grade bleeding from irritation around the colostomy stoma site.

## 2013-10-18 LAB — CEA: CEA: 0.7 ng/mL (ref 0.0–5.0)

## 2013-10-19 ENCOUNTER — Ambulatory Visit (HOSPITAL_BASED_OUTPATIENT_CLINIC_OR_DEPARTMENT_OTHER): Payer: Medicare Other | Admitting: Lab

## 2013-10-19 ENCOUNTER — Ambulatory Visit (HOSPITAL_BASED_OUTPATIENT_CLINIC_OR_DEPARTMENT_OTHER): Payer: Medicare Other | Admitting: Oncology

## 2013-10-19 ENCOUNTER — Telehealth: Payer: Self-pay | Admitting: *Deleted

## 2013-10-19 ENCOUNTER — Ambulatory Visit: Payer: Medicare Other | Admitting: Hematology and Oncology

## 2013-10-19 ENCOUNTER — Telehealth: Payer: Self-pay | Admitting: Oncology

## 2013-10-19 VITALS — BP 122/45 | HR 58 | Temp 98.0°F | Resp 17 | Ht 70.0 in | Wt 199.9 lb

## 2013-10-19 DIAGNOSIS — C20 Malignant neoplasm of rectum: Secondary | ICD-10-CM

## 2013-10-19 DIAGNOSIS — D649 Anemia, unspecified: Secondary | ICD-10-CM

## 2013-10-19 LAB — CBC WITH DIFFERENTIAL/PLATELET
Basophils Absolute: 0.1 10*3/uL (ref 0.0–0.1)
EOS%: 1.1 % (ref 0.0–7.0)
Eosinophils Absolute: 0.1 10*3/uL (ref 0.0–0.5)
HGB: 9.6 g/dL — ABNORMAL LOW (ref 13.0–17.1)
LYMPH%: 8.3 % — ABNORMAL LOW (ref 14.0–49.0)
MCH: 29.4 pg (ref 27.2–33.4)
MCV: 89.8 fL (ref 79.3–98.0)
MONO%: 7.7 % (ref 0.0–14.0)
NEUT#: 8.3 10*3/uL — ABNORMAL HIGH (ref 1.5–6.5)
NEUT%: 82.3 % — ABNORMAL HIGH (ref 39.0–75.0)
Platelets: 160 10*3/uL (ref 140–400)

## 2013-10-19 LAB — RETICULOCYTES
Immature Retic Fract: 6.3 % (ref 3.00–10.60)
RBC: 3.29 10*6/uL — ABNORMAL LOW (ref 4.20–5.82)
Retic Ct Abs: 25.66 10*3/uL — ABNORMAL LOW (ref 34.80–93.90)

## 2013-10-19 NOTE — Telephone Encounter (Signed)
Called pt, instructed him to increase Ferrous Sulfate 325 mg to TID- per Dr. Truett Perna. He voiced appropriate instructions. Stated he will take one in the morning, at noon and one at night. Per Dr. Truett Perna: if not better in 2 months, will evaluate anemia further.

## 2013-10-19 NOTE — Progress Notes (Signed)
   [facility]    OFFICE PROGRESS NOTE   INTERVAL HISTORY:   Nicholas [name] has been followed by Dr. Dalene Carrow since being diagnosed with rectal cancer in 2008. He returns for a scheduled yearly followup visit. He feels well. He relates recent weight loss to a diuretic. He has intermittent bleeding at the colostomy site when he changes the bag and wafer. He has undergone repeat colonoscopy evaluations by Dr. Jarold Motto and recently saw Dr. Jarold Motto for evaluation of a Hemoccult positive stool. Dr. Jarold Motto does not recommend further endoscopy procedures. Nicholas [name] is taking iron once daily.  Objective:  Vital signs in last 24 hours:  Blood pressure 122/45, pulse 58, temperature 98 F (36.7 C), temperature source Oral, resp. rate 17, height 5\' 10"  (1.778 m), weight 199 lb 14.4 oz (90.674 kg), SpO2 100.00%.    HEENT: Neck without mass Lymphatics: No cervical, supraclavicular, or axillary nodes. Prominent fat tissue in the inguinal area bilaterally. Resp: Lungs clear bilaterally Cardio: Irregular GI: No hepatomegaly, no mass, left lower quadrant colostomy Vascular: Pitting edema at the right greater than left lower leg   Lab Results:  Lab Results  Component Value Date   WBC 10.1 10/19/2013   HGB 9.6* 10/19/2013   HCT 29.4* 10/19/2013   MCV 89.8 10/19/2013   PLT 160 10/19/2013   ANC 8.3 Reticulocyte count 0.78%  Blood smear-numerous ovalocytes, future drops, rare cigar cell, there are hypochromic red cells, the polychromasia is not increased. The white cell morphology is unremarkable. The platelets appear adequate in number.  10/16/2013-ferritin 162.5, percent transferrin saturation 7.7, B12 418, folate 24.6    Medications: I have reviewed the patient's current medications.  Assessment/Plan:  1. Rectal cancer-stage II, T3 N0, status post neoadjuvant infusional 5-fluorouracil and radiation, status post an APR 06/01/2007. He completed 4 months of adjuvant  Xeloda.  2. Anemia-likely related to chronic GI bleeding and iron deficiency versus myelodysplasia. We will ask him to increase the ferrous sulfate to 3 times per day. If the hemoglobin does not improve we will consider a bone marrow biopsy.  Disposition:  He remains in clinical remission from rectal cancer. Dr. Jarold Motto does not recommend further endoscopic evaluation of the Hemoccult positive stool/anemia. I will recommend he increase the ferrous sulfate to 3 times per day. He will return for an office visit and repeat CBC in 2 months. We will consider proceeding with a bone marrow biopsy if the anemia does not correct with an increased dose of iron.   Thornton Papas, MD  10/19/2013  4:34 PM

## 2013-10-19 NOTE — Telephone Encounter (Signed)
gv and printed appt sched and avs for pt for DEC...sent pt to lab °

## 2013-11-20 ENCOUNTER — Inpatient Hospital Stay (HOSPITAL_COMMUNITY): Payer: Medicare Other

## 2013-11-20 ENCOUNTER — Inpatient Hospital Stay (HOSPITAL_COMMUNITY)
Admission: EM | Admit: 2013-11-20 | Discharge: 2013-11-26 | DRG: 690 | Disposition: A | Discharge status: Home / Self Care | Payer: Medicare Other | Attending: Internal Medicine | Admitting: Internal Medicine

## 2013-11-20 ENCOUNTER — Encounter (HOSPITAL_COMMUNITY): Payer: Self-pay | Admitting: Emergency Medicine

## 2013-11-20 ENCOUNTER — Emergency Department (HOSPITAL_COMMUNITY): Payer: Medicare Other

## 2013-11-20 DIAGNOSIS — I5022 Chronic systolic (congestive) heart failure: Secondary | ICD-10-CM | POA: Diagnosis present

## 2013-11-20 DIAGNOSIS — E7800 Pure hypercholesterolemia, unspecified: Secondary | ICD-10-CM | POA: Diagnosis present

## 2013-11-20 DIAGNOSIS — N184 Chronic kidney disease, stage 4 (severe): Secondary | ICD-10-CM | POA: Diagnosis present

## 2013-11-20 DIAGNOSIS — K219 Gastro-esophageal reflux disease without esophagitis: Secondary | ICD-10-CM | POA: Diagnosis present

## 2013-11-20 DIAGNOSIS — I2510 Atherosclerotic heart disease of native coronary artery without angina pectoris: Secondary | ICD-10-CM | POA: Diagnosis present

## 2013-11-20 DIAGNOSIS — Z87891 Personal history of nicotine dependence: Secondary | ICD-10-CM

## 2013-11-20 DIAGNOSIS — Z882 Allergy status to sulfonamides status: Secondary | ICD-10-CM

## 2013-11-20 DIAGNOSIS — I509 Heart failure, unspecified: Secondary | ICD-10-CM | POA: Diagnosis present

## 2013-11-20 DIAGNOSIS — A498 Other bacterial infections of unspecified site: Secondary | ICD-10-CM | POA: Diagnosis present

## 2013-11-20 DIAGNOSIS — Z951 Presence of aortocoronary bypass graft: Secondary | ICD-10-CM

## 2013-11-20 DIAGNOSIS — I493 Ventricular premature depolarization: Secondary | ICD-10-CM | POA: Diagnosis present

## 2013-11-20 DIAGNOSIS — Z85828 Personal history of other malignant neoplasm of skin: Secondary | ICD-10-CM

## 2013-11-20 DIAGNOSIS — D696 Thrombocytopenia, unspecified: Secondary | ICD-10-CM | POA: Diagnosis present

## 2013-11-20 DIAGNOSIS — I252 Old myocardial infarction: Secondary | ICD-10-CM

## 2013-11-20 DIAGNOSIS — Z85038 Personal history of other malignant neoplasm of large intestine: Secondary | ICD-10-CM

## 2013-11-20 DIAGNOSIS — Z85048 Personal history of other malignant neoplasm of rectum, rectosigmoid junction, and anus: Secondary | ICD-10-CM

## 2013-11-20 DIAGNOSIS — E872 Acidosis, unspecified: Secondary | ICD-10-CM | POA: Diagnosis not present

## 2013-11-20 DIAGNOSIS — I4891 Unspecified atrial fibrillation: Secondary | ICD-10-CM | POA: Diagnosis present

## 2013-11-20 DIAGNOSIS — E8809 Other disorders of plasma-protein metabolism, not elsewhere classified: Secondary | ICD-10-CM | POA: Diagnosis present

## 2013-11-20 DIAGNOSIS — A419 Sepsis, unspecified organism: Secondary | ICD-10-CM

## 2013-11-20 DIAGNOSIS — B9620 Unspecified Escherichia coli [E. coli] as the cause of diseases classified elsewhere: Secondary | ICD-10-CM

## 2013-11-20 DIAGNOSIS — Z96659 Presence of unspecified artificial knee joint: Secondary | ICD-10-CM

## 2013-11-20 DIAGNOSIS — I129 Hypertensive chronic kidney disease with stage 1 through stage 4 chronic kidney disease, or unspecified chronic kidney disease: Secondary | ICD-10-CM | POA: Diagnosis present

## 2013-11-20 DIAGNOSIS — N12 Tubulo-interstitial nephritis, not specified as acute or chronic: Secondary | ICD-10-CM | POA: Diagnosis present

## 2013-11-20 DIAGNOSIS — N179 Acute kidney failure, unspecified: Secondary | ICD-10-CM

## 2013-11-20 DIAGNOSIS — Z933 Colostomy status: Secondary | ICD-10-CM

## 2013-11-20 DIAGNOSIS — I4949 Other premature depolarization: Secondary | ICD-10-CM | POA: Diagnosis not present

## 2013-11-20 DIAGNOSIS — N400 Enlarged prostate without lower urinary tract symptoms: Secondary | ICD-10-CM | POA: Diagnosis present

## 2013-11-20 DIAGNOSIS — D509 Iron deficiency anemia, unspecified: Secondary | ICD-10-CM | POA: Diagnosis present

## 2013-11-20 DIAGNOSIS — N138 Other obstructive and reflux uropathy: Secondary | ICD-10-CM | POA: Diagnosis present

## 2013-11-20 DIAGNOSIS — I10 Essential (primary) hypertension: Secondary | ICD-10-CM | POA: Diagnosis present

## 2013-11-20 DIAGNOSIS — N289 Disorder of kidney and ureter, unspecified: Secondary | ICD-10-CM

## 2013-11-20 DIAGNOSIS — R7881 Bacteremia: Secondary | ICD-10-CM | POA: Diagnosis not present

## 2013-11-20 DIAGNOSIS — Z7901 Long term (current) use of anticoagulants: Secondary | ICD-10-CM

## 2013-11-20 DIAGNOSIS — Z881 Allergy status to other antibiotic agents status: Secondary | ICD-10-CM

## 2013-11-20 DIAGNOSIS — M129 Arthropathy, unspecified: Secondary | ICD-10-CM | POA: Diagnosis present

## 2013-11-20 DIAGNOSIS — N401 Enlarged prostate with lower urinary tract symptoms: Secondary | ICD-10-CM | POA: Diagnosis present

## 2013-11-20 DIAGNOSIS — N170 Acute kidney failure with tubular necrosis: Secondary | ICD-10-CM | POA: Diagnosis present

## 2013-11-20 DIAGNOSIS — N10 Acute tubulo-interstitial nephritis: Principal | ICD-10-CM | POA: Diagnosis present

## 2013-11-20 LAB — URINALYSIS, ROUTINE W REFLEX MICROSCOPIC
Ketones, ur: 15 mg/dL — AB
Nitrite: POSITIVE — AB
Specific Gravity, Urine: 1.021 (ref 1.005–1.030)
Urobilinogen, UA: 1 mg/dL (ref 0.0–1.0)
pH: 5.5 (ref 5.0–8.0)

## 2013-11-20 LAB — HEPATIC FUNCTION PANEL
ALT: 14 U/L (ref 0–53)
Albumin: 3.2 g/dL — ABNORMAL LOW (ref 3.5–5.2)
Alkaline Phosphatase: 64 U/L (ref 39–117)
Total Protein: 6.2 g/dL (ref 6.0–8.3)

## 2013-11-20 LAB — LACTIC ACID, PLASMA: Lactic Acid, Venous: 1.6 mmol/L (ref 0.5–2.2)

## 2013-11-20 LAB — MAGNESIUM: Magnesium: 2 mg/dL (ref 1.5–2.5)

## 2013-11-20 LAB — CBC WITH DIFFERENTIAL/PLATELET
Basophils Absolute: 0 10*3/uL (ref 0.0–0.1)
Basophils Relative: 0 % (ref 0–1)
HCT: 28.2 % — ABNORMAL LOW (ref 39.0–52.0)
MCHC: 33.3 g/dL (ref 30.0–36.0)
Neutro Abs: 18.9 10*3/uL — ABNORMAL HIGH (ref 1.7–7.7)
Neutrophils Relative %: 91 % — ABNORMAL HIGH (ref 43–77)
RBC: 3.04 MIL/uL — ABNORMAL LOW (ref 4.22–5.81)
RDW: 18.3 % — ABNORMAL HIGH (ref 11.5–15.5)
WBC: 20.9 10*3/uL — ABNORMAL HIGH (ref 4.0–10.5)

## 2013-11-20 LAB — PROTIME-INR
INR: 2.81 — ABNORMAL HIGH (ref 0.00–1.49)
Prothrombin Time: 28.6 seconds — ABNORMAL HIGH (ref 11.6–15.2)

## 2013-11-20 LAB — BASIC METABOLIC PANEL
BUN: 48 mg/dL — ABNORMAL HIGH (ref 6–23)
Calcium: 9.5 mg/dL (ref 8.4–10.5)
Chloride: 101 mEq/L (ref 96–112)
GFR calc Af Amer: 12 mL/min — ABNORMAL LOW (ref >90)
Potassium: 4.7 mEq/L (ref 3.5–5.1)
Sodium: 134 mEq/L — ABNORMAL LOW (ref 135–145)

## 2013-11-20 LAB — GLUCOSE, CAPILLARY: Glucose-Capillary: 99 mg/dL (ref 70–99)

## 2013-11-20 LAB — APTT: aPTT: 45 seconds — ABNORMAL HIGH (ref 24–37)

## 2013-11-20 LAB — LIPASE, BLOOD: Lipase: 14 U/L (ref 11–59)

## 2013-11-20 LAB — URINE MICROSCOPIC-ADD ON

## 2013-11-20 LAB — CREATININE, URINE, RANDOM: Creatinine, Urine: 124.28 mg/dL

## 2013-11-20 MED ORDER — SODIUM CHLORIDE 0.9 % IV SOLN
INTRAVENOUS | Status: DC
  Administered 2013-11-20: 10:00:00 via INTRAVENOUS

## 2013-11-20 MED ORDER — SODIUM CHLORIDE 0.9 % IJ SOLN: 3.0000 mL | INTRAMUSCULAR | Status: DC | PRN

## 2013-11-20 MED ORDER — SODIUM CHLORIDE 0.9 % IJ SOLN
3.0000 mL | Freq: Two times a day (BID) | INTRAMUSCULAR | Status: DC
  Administered 2013-11-22 – 2013-11-26 (×7): 3 mL via INTRAVENOUS

## 2013-11-20 MED ORDER — FINASTERIDE 5 MG PO TABS
5.0000 mg | ORAL_TABLET | Freq: Every day | ORAL | Status: DC
  Administered 2013-11-21 – 2013-11-26 (×6): 5 mg via ORAL
  Filled 2013-11-20 (×6): qty 1

## 2013-11-20 MED ORDER — METOPROLOL SUCCINATE ER 25 MG PO TB24
25.0000 mg | ORAL_TABLET | Freq: Every day | ORAL | Status: DC
  Administered 2013-11-21 – 2013-11-25 (×4): 25 mg via ORAL
  Filled 2013-11-20 (×6): qty 1

## 2013-11-20 MED ORDER — SODIUM CHLORIDE 0.9 % IV SOLN: INTRAVENOUS | Status: AC

## 2013-11-20 MED ORDER — AMOXICILLIN 500 MG PO CAPS
500.0000 mg | ORAL_CAPSULE | Freq: Three times a day (TID) | ORAL | Status: DC
  Administered 2013-11-20: 500 mg via ORAL
  Filled 2013-11-20: qty 1

## 2013-11-20 MED ORDER — PANTOPRAZOLE SODIUM 40 MG PO TBEC
40.0000 mg | DELAYED_RELEASE_TABLET | Freq: Every day | ORAL | Status: DC
  Administered 2013-11-21 – 2013-11-22 (×2): 40 mg via ORAL
  Filled 2013-11-20 (×2): qty 1

## 2013-11-20 MED ORDER — SODIUM CHLORIDE 0.9 % IV SOLN: 250.0000 mL | INTRAVENOUS | Status: DC | PRN

## 2013-11-20 MED ORDER — SODIUM CHLORIDE 0.9 % IV SOLN
500.0000 mg | Freq: Three times a day (TID) | INTRAVENOUS | Status: DC
  Administered 2013-11-20: 500 mg via INTRAVENOUS
  Filled 2013-11-20 (×4): qty 500

## 2013-11-20 MED ORDER — ASPIRIN EC 81 MG PO TBEC
81.0000 mg | DELAYED_RELEASE_TABLET | Freq: Every day | ORAL | Status: DC
  Administered 2013-11-20 – 2013-11-26 (×7): 81 mg via ORAL
  Filled 2013-11-20 (×7): qty 1

## 2013-11-20 MED ORDER — DOXAZOSIN MESYLATE 8 MG PO TABS
8.0000 mg | ORAL_TABLET | Freq: Every day | ORAL | Status: DC
  Administered 2013-11-20 – 2013-11-25 (×6): 8 mg via ORAL
  Filled 2013-11-20 (×7): qty 1

## 2013-11-20 MED ORDER — AMLODIPINE BESYLATE 5 MG PO TABS
5.0000 mg | ORAL_TABLET | Freq: Every day | ORAL | Status: DC
  Administered 2013-11-21 – 2013-11-26 (×6): 5 mg via ORAL
  Filled 2013-11-20 (×7): qty 1

## 2013-11-20 MED ORDER — SODIUM CHLORIDE 0.9 % IV SOLN
250.0000 mg | Freq: Two times a day (BID) | INTRAVENOUS | Status: DC
  Administered 2013-11-20 – 2013-11-24 (×8): 250 mg via INTRAVENOUS
  Filled 2013-11-20 (×10): qty 250

## 2013-11-20 NOTE — ED Provider Notes (Signed)
CSN: [id number]     Arrival date & time 11/20/13  0534 History   First MD Initiated Contact with Patient 11/20/13 [phone]     Chief Complaint  Patient presents with  . Hematuria  . Back Pain   (Consider location/radiation/quality/duration/timing/severity/associated sxs/prior Treatment) HPI 79 year old male presents emergency apartment with mild left flank pain.  Yesterday.  Today, the pain has resolved, but urinating this morning, he noticed blood.  He has not been able to urinate a large amount, but does not have the urgency to go.  No pain with urination.  Patient denies previous history of kidney stone.  He does have history of kidney infections.  He has history of BPH.  He sees a Insurance underwriter in [place].  Patient also with significant past medical history of colorectal cancer status post resection and colostomy.  Patient denies any fever, chills, nausea, vomiting, or abdominal pain.  No trauma to the area.  No pain with movement Past Medical History  Diagnosis Date  . 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 Pure hypercholesterolemia     takes Vytorin daily  . Personal history of colonic polyps     adenomatous  . Diverticulosis of colon (without mention of hemorrhage)   . Cardiovascular disease   . IHD (ischemic heart disease)   . History of colon cancer   . Colon cancer   . Hypertension     takes Amlodipine,Metoprolol,and Cardura daily  . Coronary artery disease   . Acute myocardial infarction, unspecified site, episode of care unspecified   . MI (myocardial infarction) 1993  . CHF (congestive heart failure)     takes Lasix daily  . H/O blood clots 1993  . Dysrhythmia   . Shortness of breath     with exertion  . Arthritis   . Joint pain   . Joint swelling   . Edema     to right leg-below knee;not any different than when he saw brackbill in jan 2013  . Bruises easily     takes ASA daily  . Skin cancer     nose  . GERD (gastroesophageal reflux disease)     takes Protonix  daily  . Diarrhea   . Constipation   . Enlarged prostate     takes Finasteride daily  . Blood transfusion 2008  . Insomnia   . Hx of CABG     1993  . Ejection fraction     EF 51%, nuclear, 2010   Past Surgical History  Procedure Laterality Date  . Shoulder surgery  2007    left  . Cataract extraction      right  . Abdominoperineal proctocolectomy  06/01/2007  . Coronary angioplasty  05/22/92  . Colostomy  2008  . Colonoscopy    . Coronary artery bypass graft  1993  . Colon surgery  2008    colectomy  . Knee arthroscopy  2010    right   . Total knee arthroplasty  02/28/2012    Procedure: TOTAL KNEE ARTHROPLASTY;  Surgeon: Raymon Mutton, MD;  Location: [facility] OR;  Service: Orthopedics;  Laterality: Right;   Family History  Problem Relation Age of Onset  . Pancreatic cancer Father   . Diabetes Son   . Emphysema Mother   . Anesthesia problems Neg Hx   . Hypotension Neg Hx   . Malignant hyperthermia Neg Hx   . Pseudochol deficiency Neg Hx    History  Substance Use Topics  . Smoking status: Former  Smoker -- 10 years    Types: Pipe, Cigars    Quit date: 05/26/1991  . Smokeless tobacco: Current User  . Alcohol Use: No    Review of Systems  See History of Present Illness; otherwise all other systems are reviewed and negative Allergies  Cephalexin; Ciprofloxacin; Hctz; Levofloxacin; Lipitor; Niaspan; Oxycodone-acetaminophen; and Sulfonamide derivatives  Home Medications   Current Outpatient Rx  Name  Route  Sig  Dispense  Refill  . amLODipine-benazepril (LOTREL) 5-20 MG per capsule   Oral   Take 1 capsule by mouth daily.   30 capsule   11   . Calcium Carbonate-Vitamin D (CALCIUM 600-D) 600-400 MG-UNIT per tablet   Oral   Take 1 tablet by mouth 2 (two) times daily.          Marland Kitchen doxazosin (CARDURA) 8 MG tablet   Oral   Take 8 mg by mouth at bedtime.          . ferrous sulfate 325 (65 FE) MG tablet   Oral   Take 325 mg by mouth daily with breakfast.          . finasteride (PROSCAR) 5 MG tablet   Oral   Take 5 mg by mouth daily.           . furosemide (LASIX) 40 MG tablet   Oral   Take 40 mg by mouth daily.         . metoprolol succinate (TOPROL-XL) 25 MG 24 hr tablet   Oral   Take 1 tablet (25 mg total) by mouth daily.   30 tablet   5   . nitroGLYCERIN (NITROSTAT) 0.4 MG SL tablet   Sublingual   Place 1 tablet (0.4 mg total) under the tongue every 5 (five) minutes as needed. For chest pain   25 tablet   6   . pantoprazole (PROTONIX) 40 MG tablet   Oral   Take 1 tablet (40 mg total) by mouth daily.   30 tablet   11   . potassium chloride (KLOR-CON) 20 MEQ packet   Oral   Take 20 mEq by mouth every other day.   16 tablet   11   . Rivaroxaban (XARELTO) 15 MG TABS tablet   Oral   Take 1 tablet (15 mg total) by mouth daily with supper.   30 tablet   5   . Saw Palmetto, Serenoa repens, (SAW PALMETTO PO)   Oral   Take 1 capsule by mouth every evening.         . simvastatin (ZOCOR) 20 MG tablet   Oral   Take 1 tablet (20 mg total) by mouth at bedtime.   90 tablet   3     DC VYTORIN   . vitamin C (ASCORBIC ACID) 500 MG tablet   Oral   Take 500 mg by mouth daily.           . Vitamins-Lipotropics (B-100 COMPLEX) TABS   Oral   Take 1 tablet by mouth daily.           BP 102/56  Pulse 69  Temp(Src) 97 F (36.1 C) (Oral)  Resp 18  Ht 5\' 10"  (1.778 m)  Wt 210 lb (95.255 kg)  BMI 30.13 kg/m2  SpO2 99% Physical Exam  Nursing note and vitals reviewed. Constitutional: He appears well-developed and well-nourished. No distress.  Cardiovascular: Normal rate, regular rhythm, normal heart sounds and intact distal pulses.  Exam reveals no gallop and no friction rub.  No murmur heard. Pulmonary/Chest: Effort normal and breath sounds normal. No respiratory distress. He has no wheezes. He has no rales. He exhibits no tenderness.  Abdominal: Soft. Bowel sounds are normal. He exhibits no distension and no mass.  There is no tenderness. There is no rebound and no guarding.  Musculoskeletal: Normal range of motion. He exhibits edema. He exhibits no tenderness.  Skin: Skin is warm and dry. No rash noted. No erythema. No pallor.    ED Course  Procedures (including critical care time) Labs Review Labs Reviewed  URINALYSIS, ROUTINE W REFLEX MICROSCOPIC - Abnormal; Notable for the following:    Color, Urine RED (*)    APPearance TURBID (*)    Hgb urine dipstick LARGE (*)    Bilirubin Urine MODERATE (*)    Ketones, ur 15 (*)    Protein, ur >300 (*)    Nitrite POSITIVE (*)    Leukocytes, UA LARGE (*)    All other components within normal limits  URINE MICROSCOPIC-ADD ON - Abnormal; Notable for the following:    Bacteria, UA MANY (*)    All other components within normal limits  URINE CULTURE  CBC WITH DIFFERENTIAL  BASIC METABOLIC PANEL   Imaging Review Ct Abdomen Pelvis Wo Contrast  11/20/2013   CLINICAL DATA:  Right flank pain and hematuria. History of rectal carcinoma.  EXAM: CT ABDOMEN AND PELVIS WITHOUT CONTRAST  TECHNIQUE: Multidetector CT imaging of the abdomen and pelvis was performed following the standard protocol without intravenous contrast.  COMPARISON:  CT chest, abdomen and pelvis 10/16/2012 and 10/18/2011.  FINDINGS: The patient has a small right pleural effusion. There is no left pleural effusion or pericardial effusion. Cardiomegaly is noted. Compressive atelectasis is seen in the right lung base with due to the patient's small effusion. Calcific coronary artery disease is identified. The patient is status post CABG.  The right kidney is atrophic. No renal or ureteral stones are identified and there is no hydronephrosis on the right or left. Two large left renal cysts are unchanged. There is stranding about the left kidney which is new since the prior CT scans.  The liver is low attenuating consistent with fatty infiltration. No focal liver lesion is seen on this uninfused examination.  The gallbladder, spleen, adrenal glands and pancreas appear normal. Ventral and parastomal hernias containing loops of bowel are again identified. There is no evidence of bowel obstruction other complicating feature. Colonic diverticulosis without diverticulitis is seen. There is extensive aortoiliac atherosclerosis without aneurysm. No lymphadenopathy is identified. A small fluid collection anterior to the sacrum at the site of prior proctocolectomy and is again seen and smaller measuring 1.7 cm in diameter compared to 2.1 cm. No lytic or sclerotic bony lesion is identified.  IMPRESSION: The right kidney is atrophic but there is no hydronephrosis on the right or left and no urinary tract stones.  Stranding about the left kidney is new since the prior CT scanning and could be due to infectious or inflammatory process. Two large left renal cysts are unchanged.  No change in ventral and parastomal hernias containing loops of bowel without obstruction other complicating feature.  Diverticulosis without diverticulitis.  Small right pleural effusion with associated compressive basilar atelectasis.  Cardiomegaly.  Extensive atherosclerosis.  Fatty infiltration of the liver.   Electronically Signed   By: Drusilla Kanner M.D.   On: 11/20/2013 07:26     MDM  No diagnosis found. 79 year old male with hematuria today, left flank pain yesterday.  We'll check urine.  May need CT scan   Care passed to Dr Jeraldine Loots awaiting labs, abx.    Olivia Mackie, MD 11/20/13 [phone]

## 2013-11-20 NOTE — Consult Note (Signed)
Consulted, however noted this patient has colostomy for many years. Using closed end pouches at home however while inpatient I have assisted staff to order 2pc 2 3/4" pouches to allow for accurate I&O and drainage.    Davina Poke RN, CWOCN [phone]

## 2013-11-20 NOTE — H&P (Signed)
Date: 11/20/2013               Patient Name:  Nicholas [name] MRN: [id number]  DOB: 09-01-1934 Age / Sex: 79 y.o., male   PCP: [name], MD         Medical Service: Internal Medicine Teaching Service         Attending Physician: Dr. Inez Catalina, MD    First Contact: Dr. Johna Roles Pager: [phone]  Second Contact: Dr. Burtis Junes Pager: [phone]       After Hours (After 5p/  First Contact Pager: [phone]  weekends / holidays): Second Contact Pager: [phone]   Chief Complaint: Left flank pain and hematuria   History of Present Illness:  Patient is a 79 year old man with a PMH of BPH managed by an Urologist at [facility] , CHF/ICM, s/p CABG in 1993, A Fib since July 2014 managed by [facility] cardiology, Rectal cancer in 2008 with chronic guaiac positive stool managed by GI and Oncology, who presented with left flank pain and hematuria.  Patient states that he woke up from sudden onset left flank pain at 3 AM yesterday on 11/19/2013. He described that his pain was located at left flank area, sharp pain,10/10, constant, with no radiation. He states that his pain lasted about 12 hours and subsided spontaneously. He noticed decreased urine output yesterday evening and did not pay much attention to it until early this am when he noted that he "was peeing blood."  Patient presented to the ED for further evaluation. He was noted to have worsening of renal function with Cr of 4.73, and evidences of Pyelonephritis. He was started on Imipenem due to multiple ABX allergies. The IMTS is called for admission.    Denies previous history of kidney stone or kidney infections.  Denies sick contacts.  Denies injury or trauma to the back area. Denies urinary frequency, urgency or dysuria. Denies fever, chills, headaches, rhinorrhea, sneezing, sore throat, shortness breath, chest pain/pressure/palpitation, nausea, vomiting, abdominal pain or weakness.   Meds: Current Facility-Administered Medications  Medication  Dose Route Frequency Provider Last Rate Last Dose  . 0.9 %  sodium chloride infusion  250 mL Intravenous PRN Arlyn Buerkle, MD      . 0.9 %  sodium chloride infusion   Intravenous Continuous Nyasia Baxley, MD      . amLODipine (NORVASC) tablet 5 mg  5 mg Oral Daily Ethanael Veith, MD      . doxazosin (CARDURA) tablet 8 mg  8 mg Oral QHS Amelya Mabry, MD      . Melene Muller ON 11/21/2013] finasteride (PROSCAR) tablet 5 mg  5 mg Oral Daily Candy Leverett, MD      . imipenem-cilastatin (PRIMAXIN) 500 mg in sodium chloride 0.9 % 100 mL IVPB  500 mg Intravenous Q8H Gerhard Munch, MD 200 mL/hr at 11/20/13 1016 500 mg at 11/20/13 1016  . [START ON 11/21/2013] metoprolol succinate (TOPROL-XL) 24 hr tablet 25 mg  25 mg Oral Daily Kendria Halberg, MD      . Melene Muller ON 11/21/2013] pantoprazole (PROTONIX) EC tablet 40 mg  40 mg Oral Daily Mildred Bollard, MD      . sodium chloride 0.9 % injection 3 mL  3 mL Intravenous Q12H Lasharn Bufkin, MD      . sodium chloride 0.9 % injection 3 mL  3 mL Intravenous PRN Dede Query, MD        Allergies: Allergies as of 11/20/2013 - Review Complete 11/20/2013  Allergen Reaction Noted  .  Cephalexin  05/05/2012  . Ciprofloxacin Other (See Comments)   . Hctz [hydrochlorothiazide]  05/26/2011  . Levofloxacin  05/05/2012  . Lipitor [atorvastatin calcium]  05/26/2011  . Niaspan [niacin er]  05/26/2011  . Oxycodone-acetaminophen Other (See Comments)   . Sulfonamide derivatives Other (See Comments)    Past Medical History  Diagnosis Date  . 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 Pure hypercholesterolemia     takes Vytorin daily  . Personal history of colonic polyps     adenomatous  . Diverticulosis of colon (without mention of hemorrhage)   . Cardiovascular disease   . IHD (ischemic heart disease)   . History of colon cancer   . Colon cancer   . Hypertension     takes Amlodipine,Metoprolol,and Cardura daily  . Coronary artery disease   . Acute myocardial infarction, unspecified site, episode of care unspecified   . MI (myocardial  infarction) 1993  . CHF (congestive heart failure)     takes Lasix daily  . H/O blood clots 1993  . Dysrhythmia   . Shortness of breath     with exertion  . Arthritis   . Joint pain   . Joint swelling   . Edema     to right leg-below knee;not any different than when he saw brackbill in jan 2013  . Bruises easily     takes ASA daily  . Skin cancer     nose  . GERD (gastroesophageal reflux disease)     takes Protonix daily  . Diarrhea   . Constipation   . Enlarged prostate     takes Finasteride daily  . Blood transfusion 2008  . Insomnia   . Hx of CABG     1993  . Ejection fraction     EF 51%, nuclear, 2010   Past Surgical History  Procedure Laterality Date  . Shoulder surgery  2007    left  . Cataract extraction      right  . Abdominoperineal proctocolectomy  06/01/2007  . Coronary angioplasty  05/22/92  . Colostomy  2008  . Colonoscopy    . Coronary artery bypass graft  1993  . Colon surgery  2008    colectomy  . Knee arthroscopy  2010    right   . Total knee arthroplasty  02/28/2012    Procedure: TOTAL KNEE ARTHROPLASTY;  Surgeon: Raymon Mutton, MD;  Location: [facility] OR;  Service: Orthopedics;  Laterality: Right;   Family History  Problem Relation Age of Onset  . Pancreatic cancer Father   . Diabetes Son   . Emphysema Mother   . Anesthesia problems Neg Hx   . Hypotension Neg Hx   . Malignant hyperthermia Neg Hx   . Pseudochol deficiency Neg Hx    History   Social History  . Marital Status: Married    Spouse Name: N/A    Number of Children: N/A  . Years of Education: N/A   Occupational History  . retired     Psychologist, sport and exercise   Social History Main Topics  . Smoking status: Former Smoker -- 10 years    Types: Pipe, Cigars    Quit date: 05/26/1991  . Smokeless tobacco: Current User  . Alcohol Use: No  . Drug Use: No  . Sexual Activity: Not on file   Other Topics Concern  . Not on file   Social History Narrative  . No narrative on file    Review of  Systems:  Constitutional:  Denies fever, chills, diaphoresis, appetite change and positive fatigue.   HEENT:  Denies congestion, sore throat, rhinorrhea, sneezing, mouth sores, trouble swallowing, neck pain   Respiratory:  Denies SOB, DOE, cough, and wheezing.   Cardiovascular:  Denies palpitations and leg swelling.   Gastrointestinal:  Denies nausea, vomiting, abdominal pain, diarrhea, constipation, blood in stool and abdominal distention.   Genitourinary:  Denies dysuria, urgency, frequency. Positive for hematuria and left flank pain   Musculoskeletal:  Denies myalgias, back pain, joint swelling, arthralgias and gait problem.   Skin:  Denies pallor, rash and wound.   Neurological:  Denies dizziness, seizures, syncope, weakness, light-headedness, numbness and headaches.    .    Physical Exam: Blood pressure 129/51, pulse 50, temperature 98.6 F (37 C), temperature source Oral, resp. rate 20, height 5\' 10"  (1.778 m), weight 214 lb 4.6 oz (97.2 kg), SpO2 99.00%. General: acute sickness appearing Head: normocephalic and atraumatic.  Eyes: vision grossly intact, pupils equal, pupils round, pupils reactive to light, no injection and anicteric.  Mouth: pharynx pink and moist, no erythema, and no exudates.  Neck: supple, full ROM, no thyromegaly, no JVD, and no carotid bruits.  Lungs: normal respiratory effort, no accessory muscle use, normal breath sounds, no crackles, and no wheezes. Heart: irregularly irregular rhythm, no murmur, no gallop, and no rub.  Abdomen: soft, rounded, LLQ colostomy bag, non-tender, normal bowel sounds, no distention, no guarding, no rebound tenderness, no hepatomegaly, and no splenomegaly.  Equivocal left CTA tenderness  Msk: no joint swelling, no joint warmth, and no redness over joints.  Pulses: 2+ DP/PT pulses bilaterally Extremities: No cyanosis, clubbing. B/L LE 3+ pitting edema right > Left.  Neurologic: alert & oriented X3, cranial nerves II-XII intact,  strength normal in all extremities, sensation intact to light touch, and gait normal.  Skin: turgor normal and no rashes.  Psych: Oriented X3, memory intact for recent and remote, normally interactive, good eye contact, not anxious appearing, and not depressed appearing.   Lab results: Basic Metabolic Panel:  Recent Labs  56/21/30 0804  Jomarion Mish 134*  K 4.7  CL 101  CO2 22  GLUCOSE 90  BUN 48*  CREATININE 4.73*  CALCIUM 9.5   Liver Function Tests: No results found for this basename: AST, ALT, ALKPHOS, BILITOT, PROT, ALBUMIN,  in the last 72 hours No results found for this basename: LIPASE, AMYLASE,  in the last 72 hours No results found for this basename: AMMONIA,  in the last 72 hours CBC:  Recent Labs  11/20/13 0804  WBC 20.9*  NEUTROABS 18.9*  HGB 9.4*  HCT 28.2*  MCV 92.8  PLT 105*   Cardiac Enzymes:  Recent Labs  11/20/13 1150  TROPONINI <0.30   Urinalysis:  Recent Labs  11/20/13 0630  COLORURINE RED*  LABSPEC 1.021  PHURINE 5.5  GLUCOSEU NEGATIVE  HGBUR LARGE*  BILIRUBINUR MODERATE*  KETONESUR 15*  PROTEINUR >300*  UROBILINOGEN 1.0  NITRITE POSITIVE*  LEUKOCYTESUR LARGE*    Imaging results:  Ct Abdomen Pelvis Wo Contrast  11/20/2013   CLINICAL DATA:  Right flank pain and hematuria. History of rectal carcinoma.  EXAM: CT ABDOMEN AND PELVIS WITHOUT CONTRAST  TECHNIQUE: Multidetector CT imaging of the abdomen and pelvis was performed following the standard protocol without intravenous contrast.  COMPARISON:  CT chest, abdomen and pelvis 10/16/2012 and 10/18/2011.  FINDINGS: The patient has a small right pleural effusion. There is no left pleural effusion or pericardial effusion. Cardiomegaly is noted. Compressive atelectasis is seen in the  right lung base with due to the patient's small effusion. Calcific coronary artery disease is identified. The patient is status post CABG.  The right kidney is atrophic. No renal or ureteral stones are identified and  there is no hydronephrosis on the right or left. Two large left renal cysts are unchanged. There is stranding about the left kidney which is new since the prior CT scans.  The liver is low attenuating consistent with fatty infiltration. No focal liver lesion is seen on this uninfused examination. The gallbladder, spleen, adrenal glands and pancreas appear normal. Ventral and parastomal hernias containing loops of bowel are again identified. There is no evidence of bowel obstruction other complicating feature. Colonic diverticulosis without diverticulitis is seen. There is extensive aortoiliac atherosclerosis without aneurysm. No lymphadenopathy is identified. A small fluid collection anterior to the sacrum at the site of prior proctocolectomy and is again seen and smaller measuring 1.7 cm in diameter compared to 2.1 cm. No lytic or sclerotic bony lesion is identified.  IMPRESSION: The right kidney is atrophic but there is no hydronephrosis on the right or left and no urinary tract stones.  Stranding about the left kidney is new since the prior CT scanning and could be due to infectious or inflammatory process. Two large left renal cysts are unchanged.  No change in ventral and parastomal hernias containing loops of bowel without obstruction other complicating feature.  Diverticulosis without diverticulitis.  Small right pleural effusion with associated compressive basilar atelectasis.  Cardiomegaly.  Extensive atherosclerosis.  Fatty infiltration of the liver.   Electronically Signed   By: Drusilla Kanner M.D.   On: 11/20/2013 07:26    Assessment & Plan by Problem:  Patient is a 79 year old man with a PMH of BPH managed by an Urologist at [facility] , CHF/ICM, s/p CABG in 1993, A Fib since July 2014 managed by [facility] cardiology, Rectal cancer in 2008 with chronic guaiac positive stool managed by GI and Oncology, who presented with left flank pain and hematuria. UA and abd CT suggestive acute Pyelonephritis.  #  left kidney Pyelonephritis, acute    Patient with a hx of BPH on Doxazosin and Finasteride presented with acute onset left flank pain and hematuria. Physical examination revealed equivocal left CTA tenderness. Lab work showed Significant leukocytosis with WBC of 20.9.  UA and abdominal CT showed evidences of acute left kidney pyelonephritis. The etiology of acute pyelonephritis is unclear.     He had a left kidney stone on abd CT in Oct 2013, which is not noted on CT scan on admission. Given his sudden onset of left flank pain and hematuria, the clinical manifestation is suggestive of passing of kidney stone, which could cause acute pyelonephritis.    Left kidney cystic infection is unlikely since the appearance of cysts is stable and no inflammation changed noted around cysts area on abd CT scan.     Other differential diagnosis including cholecystitis, appendicitis, pancreatitis, diverticulitis, pneumonia, which are all unlikely give his clinical presentations and negative physical examinations for above DDX. Lipase is pending on admission.      Plan - Admit to Tele given his A fib with frequent PVCs. - Pan cultures - Imipenem per pharmacy ---ED discussed with pharmacist and given multiple antibiotics allergies. -  May change to po in 24 -48 hours and then complete 14 days course.  # Acute on chronic kidney failure, baseline 1.6-2.1--likely Pre-renal vs ATN No evidence of Uremia, electrolytes imbalance and metabolic derangement. AG of 11.  The patient reports decreased urinary output x1 day.  Denies nausea, anorexia or malaise.  He is noted to have [facility] renal failure with BUN/Cr of 48/4.73 on admission. The etiology of his AC renal failure is likely associated with acute pyelonephritis. His abd CT showed chronic right kidney atrophy, which makes him prone to renal failure in the setting of left kidney pyelonephritis.  His home meds of ACEI and Lasix can certainly complicate the renal failure.          Denies decreased oral intake. Denies NSAIDs use, recent contrast, or recent Abx use.  Denies ingestions of toxins. No evidence of post renal obstruction n CT scan.   - close monitoring renal function  - strict Intake and output - FEurea, lactic acid pending - Does not need renal ultrasound after discussing with radiologist - avoid all nephrotoxic medications--hold Lasix and Lotrel - IVF hydration--oral intake and IVF 50 /h x 12 h  # A Fib with Asymptomatic PVCs     Patient was noted to have a new onset atrial fibrillation with a slow HR in July 2014. His cardiologist decreased the Metoprolol from 50 to 25 mg po daily. He is on Xarelto 15 Qday for anticoagulation.   - Tele - Troponin negative x 1. Will not cycle CE - Hold Xarelto while GFR < 15--consider resume Xarelto when GFR > 15. - start ASA while Xarel is on hold  #  Acute on chronic thrombocytopenia, baseline 106-160. Last PLT was 160 in Oct. 2014.    - PLT 105 on admission, which is likely associated with AC renal failure.   - PT/APTT -  SCD.   # HYPERTENSION  - Continue Metoprolol and Norvasc - hold Lasix and ACEI in the setting of AC renal failure - consider stop CCB given his chronic moderate to severe ankle/feet pitting edema. Will defer it to his PCP/cardiology as outpatient.   # History of fluid overload--last Echo 07/31/13 showed EF of 55% with LVH and Moderate MVR.    Patient endorses baseline SOB. He uses two pillows when sleeping and does not require additional pillows. Denies orthopnea or PND. Per chart review, his Lasix was increased in July 2014 to facilitate diuresis of 34 lbs, and his lasix was decreased to 40 daily in Oct. 2014. His weight is up 11 lbs on admission.  - Daily weight and strict I&O. - hold lasix in the setting of AC renal failure.  - gentle hydration of IVF NS 50/h x 12 - monitor fluid status closely.   # BPH (benign prostatic hyperplasia)--stable, no indication of post urinary  obstruction  - continue home meds of Doxazosin and Finasteride  # ANEMIA, IRON DEFICIENCY, stable at baseline - hold iron supplement in the acute setting - resume it upon discharge,   # Personal history of malignant neoplasm of rectum, rectosigmoid junction, and anus Stable, managed by his oncologist Dr. Truett Perna.   - No indication of metastatic versus compression lesions on abdomen CT scan on admission  # VTE: SCD. Hold Heparin in the setting of thrombocytopenia. Trend PLT. PTT/INR pending     Dispo: Disposition is deferred at this time, awaiting improvement of current medical problems. Anticipated discharge in approximately 1-2 day(s).   The patient does have a current PCP Andrey Campanile Elizebeth Koller, MD) and does not need an [facility] hospital follow-up appointment after discharge.  The patient does not have transportation limitations that hinder transportation to clinic appointments.  Signed: Dede Query, MD PGY-3 IMTS 11/20/2013, 1:46 PM

## 2013-11-20 NOTE — ED Notes (Signed)
Called [employer] for ostomy bags.

## 2013-11-20 NOTE — ED Notes (Signed)
Patient states L lower back pain since yesterday.  Patient states his back is not hurting right this moment.   Patient states started having blood in urine this morning.  Patient states he is having trouble urinating.   Patient states he "can only go a little".

## 2013-11-20 NOTE — ED Notes (Signed)
Phlebotomy at bedside.

## 2013-11-20 NOTE — ED Notes (Signed)
Admitting physician at bedside

## 2013-11-20 NOTE — ED Provider Notes (Signed)
9:14 AM On re-exam the patient seems comfortable. He and his wife are aware of all results. I discussed his ABX w pharmacy, given his allergies. He was admitted due to pyelo w AKI.   Gerhard Munch, MD 11/20/13 [phone]

## 2013-11-20 NOTE — Progress Notes (Signed)
ANTIBIOTIC CONSULT NOTE - INITIAL  Pharmacy Consult for  Imipenem Indication: pyelonephritis coverage  Allergies  Allergen Reactions  . Cephalexin   . Ciprofloxacin Other (See Comments)    Reaction unknown  . Hctz [Hydrochlorothiazide]     DIZZINESS   . Levofloxacin   . Lipitor [Atorvastatin Calcium]     MUSCLE PAIN  . Niaspan [Niacin Er]     FLUSHING   . Oxycodone-Acetaminophen Other (See Comments)    Reaction unknown  . Sulfonamide Derivatives Other (See Comments)    Reaction unkonwn    Patient Measurements: Height: 5\' 10"  (177.8 cm) Weight: 214 lb 4.6 oz (97.2 kg) IBW/kg (Calculated) : 73  Vital Signs: Temp: 98.7 F (37.1 C) (11/25 1419) Temp src: Oral (11/25 1419) BP: 142/47 mmHg (11/25 1419) Pulse Rate: 71 (11/25 1419)  Labs:  Recent Labs  11/20/13 0804  WBC 20.9*  HGB 9.4*  PLT 105*  CREATININE 4.73*   Estimated Creatinine Clearance: 15.3 ml/min (by C-G formula based on Cr of 4.73).  Microbiology:  11/25 - blood and urine cultures sent  Medical History: Past Medical History  Diagnosis Date  . 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 Pure hypercholesterolemia     takes Vytorin daily  . Personal history of colonic polyps     adenomatous  . Diverticulosis of colon (without mention of hemorrhage)   . Cardiovascular disease   . IHD (ischemic heart disease)   . History of colon cancer   . Colon cancer   . Hypertension     takes Amlodipine,Metoprolol,and Cardura daily  . Coronary artery disease   . Acute myocardial infarction, unspecified site, episode of care unspecified   . MI (myocardial infarction) 1993  . CHF (congestive heart failure)     takes Lasix daily  . H/O blood clots 1993  . Dysrhythmia   . Shortness of breath     with exertion  . Arthritis   . Joint pain   . Joint swelling   . Edema     to right leg-below knee;not any different than when he saw brackbill in jan 2013  . Bruises easily     takes ASA daily  . Skin cancer     nose  . GERD (gastroesophageal reflux disease)     takes Protonix daily  . Diarrhea   . Constipation   . Enlarged prostate     takes Finasteride daily  . Blood transfusion 2008  . Insomnia   . Hx of CABG     1993  . Ejection fraction     EF 51%, nuclear, 2010   Assessment:   Imipenem begun this morning with 500 mg IV.   Acute on chronic renal insufficiency.  Cultures sent.  Goal of Therapy:  appropriate Imipemen dose for renal function and infection  Plan:   Will continue Imipenem 250 mg IV q12hrs.  Follow up renal function for any need to adjust.  Follow culture data and clinical progress.  Dennie Fetters, [place] Pager: [phone] 11/20/2013,2:22 PM

## 2013-11-21 LAB — CBC
MCH: 30.6 pg (ref 26.0–34.0)
MCHC: 33.2 g/dL (ref 30.0–36.0)
MCV: 92.3 fL (ref 78.0–100.0)
Platelets: 86 10*3/uL — ABNORMAL LOW (ref 150–400)
RBC: 3.1 MIL/uL — ABNORMAL LOW (ref 4.22–5.81)

## 2013-11-21 LAB — BASIC METABOLIC PANEL
BUN: 57 mg/dL — ABNORMAL HIGH (ref 6–23)
CO2: 18 mEq/L — ABNORMAL LOW (ref 19–32)
CO2: 18 mEq/L — ABNORMAL LOW (ref 19–32)
CO2: 18 mEq/L — ABNORMAL LOW (ref 19–32)
Calcium: 9.2 mg/dL (ref 8.4–10.5)
Calcium: 9.2 mg/dL (ref 8.4–10.5)
Calcium: 9.3 mg/dL (ref 8.4–10.5)
Chloride: 102 mEq/L (ref 96–112)
Chloride: 100 mEq/L (ref 96–112)
Creatinine, Ser: 5.15 mg/dL — ABNORMAL HIGH (ref 0.50–1.35)
GFR calc Af Amer: 11 mL/min — ABNORMAL LOW (ref >90)
GFR calc non Af Amer: 10 mL/min — ABNORMAL LOW (ref >90)
GFR calc non Af Amer: 9 mL/min — ABNORMAL LOW (ref >90)
Glucose, Bld: 82 mg/dL (ref 70–99)
Glucose, Bld: 99 mg/dL (ref 70–99)
Glucose, Bld: 96 mg/dL (ref 70–99)
Potassium: 5.3 mEq/L — ABNORMAL HIGH (ref 3.5–5.1)
Potassium: 4.5 mEq/L (ref 3.5–5.1)
Sodium: 130 mEq/L — ABNORMAL LOW (ref 135–145)
Sodium: 133 mEq/L — ABNORMAL LOW (ref 135–145)
Sodium: 131 mEq/L — ABNORMAL LOW (ref 135–145)

## 2013-11-21 LAB — PROTIME-INR: INR: 2.38 — ABNORMAL HIGH (ref 0.00–1.49)

## 2013-11-21 LAB — GLUCOSE, CAPILLARY: Glucose-Capillary: 104 mg/dL — ABNORMAL HIGH (ref 70–99)

## 2013-11-21 LAB — TROPONIN I: Troponin I: <0.3 ng/mL (ref <0.30)

## 2013-11-21 LAB — APTT: aPTT: 45 seconds — ABNORMAL HIGH (ref 24–37)

## 2013-11-21 LAB — TSH: TSH: 1.457 u[IU]/mL (ref 0.350–4.500)

## 2013-11-21 MED ORDER — SODIUM CHLORIDE 0.9 % IV SOLN: INTRAVENOUS | Status: DC

## 2013-11-21 MED ORDER — SODIUM CHLORIDE 0.9 % IV SOLN
INTRAVENOUS | Status: AC
  Administered 2013-11-21: 10:00:00 via INTRAVENOUS

## 2013-11-21 NOTE — H&P (Signed)
  Date: 11/21/2013  Patient name: Nicholas [name]  Medical record number: [id number]  Date of birth: 1934/05/03   I have seen and evaluated Nicholas [name] and discussed their care with the Residency Team.  I spoke with Nicholas [name] with the medical team.  He confirms HPI.  His main complaints were sudden onset of left flank pain which resolved on its own in about 12 hours along with decreased UOP and hematuria.  The hematuria prompted his evaluation.  In the ED he was noted to be in AKI with a leukocytosis and a CT scan showed evidence of pyelonephritis.  He was started on imipenem due to multiple drug allergies.  He has never had a kidney stone, however, on previous CT of the abdomen there was one apparent on the left.   Assessment and Plan: I have seen and evaluated the patient as outlined above. I agree with the formulated Assessment and Plan as detailed in the residents' admission note, with the following changes:   1. Pyelonephritis.  - Blood cultures and urine cultures pending - Imipenem - once improved, will need to plan out which oral Abx he can be on given his allergies.  He reports not being able to remember what reaction he had to which Abx, but he does note that one or more of them caused him to have difficulty breathing.   - Will likely need prolonged course given pyelonephritis - Strict I/O  2. AKI on CKD (baseline 1.6 - 2.1) - Cr has doubled - IVF at low rate overnight given h/o CHF per chart review  - Strict I/O - FE Urea shows pre-renal etiology, however, BUN/Cr ratio only 10 making infrarenal likely a contributor - If no improvement in renal function with fluids, would check post void residual for possible post renal component - Trend BMPs - Avoid nephrotoxins  Other issues per resident note.   Nicholas Catalina, MD 11/26/201411:17 AM

## 2013-11-21 NOTE — Progress Notes (Signed)
Called Dr. Mikey Bussing with BMET results, also made him aware of fluids to be discontinued. Orders given. Monitoring will continue.

## 2013-11-21 NOTE — Progress Notes (Signed)
Patient voided 50 ml, assisted pt to bed, bladder scan performed results 22 ml, Pt then requested to sit back up in the recliner chair, states he breaths better when in chair and sleeps in recliner chair most nites at home. Tele show atrial Fib, with freq pair of PVC, paged PA to notify Georgette Dover

## 2013-11-21 NOTE — Care Management Note (Unsigned)
    Page 1 of 1   11/21/2013     1:36:25 PM   CARE MANAGEMENT NOTE 11/21/2013  Patient:  Nicholas [name], Nicholas [name]   Account Number:  [id number]  Date Initiated:  11/21/2013  Documentation initiated by:  Makinzey Banes  Subjective/Objective Assessment:   PT ADM ON 11/20/13 WITH PYELONEPHRITIS.  PTA, PT INDEPENDENT, LIVES WITH SPOUSE.     Action/Plan:   WILL FOLLOW FOR DISCHARGE NEEDS AS PT PROGRESSES.   Anticipated DC Date:  11/24/2013   Anticipated DC Plan:  HOME/SELF CARE      DC Planning Services  CM consult      Choice offered to / List presented to:             Status of service:  In process, will continue to follow Medicare Important Message given?   (If response is "NO", the following Medicare IM given date fields will be blank) Date Medicare IM given:   Date Additional Medicare IM given:    Discharge Disposition:    Per UR Regulation:  Reviewed for med. necessity/level of care/duration of stay  If discussed at Long Length of Stay Meetings, dates discussed:    Comments:

## 2013-11-21 NOTE — Progress Notes (Signed)
Subjective:  Pt seen and examined in AM. No acute events overnight. Pt reports his bed is uncomfortable. No flank pain or hematuria. He denies fever, chills, nausea, vomiting, abdominal pain, or change in BMs. Pt reports allergies to various antibiotics but does not remember the type of reaction, possibly shortness of breath.    Objective: Vital signs in last 24 hours: Filed Vitals:   11/20/13 1230 11/20/13 1419 11/20/13 2027 11/21/13 0533  BP: 129/51 142/47 147/49 128/52  Pulse:  71 60 80  Temp: 98.6 F (37 C) 98.7 F (37.1 C) 98.3 F (36.8 C) 98.2 F (36.8 C)  TempSrc: Oral Oral Oral Oral  Resp: 20 20 24 20   Height: 5\' 10"  (1.778 m)     Weight: 97.2 kg (214 lb 4.6 oz)   98.3 kg (216 lb 11.4 oz)  SpO2: 99% 97% 96% 98%   Weight change: 1.945 kg (4 lb 4.6 oz)  Intake/Output Summary (Last 24 hours) at 11/21/13 1047 Last data filed at 11/21/13 1004  Gross per 24 hour  Intake   1180 ml  Output    700 ml  Net    480 ml   General: NAD Head: normocephalic and atraumatic.  Eyes: vision grossly intact, pupils equal, pupils round, pupils reactive to light, no injection and anicteric.  Mouth: pharynx pink and moist, no erythema, and no exudates.  Neck: supple, full ROM, no thyromegaly, no JVD, and no carotid bruits.  Lungs: normal respiratory effort, no accessory muscle use, normal breath sounds, no crackles, and no wheezes. Heart: irregularly irregular rhythm, no murmur, no gallop, and no rub.  Abdomen: soft, rounded, LLQ colostomy bag, non-tender, normal bowel sounds, no distention, no guarding, no rebound tenderness, no hepatomegaly, and no splenomegaly. No CVA tenderness  Msk: no joint swelling, no joint warmth, and no redness over joints.  Extremities:  B/L LE 2+ pitting edema   Neurologic: alert & oriented X3, cranial nerves II-XII intact, strength normal in all extremities, sensation intact to light touch, and gait normal.  Skin: turgor normal and no rashes.  Psych:  Oriented X3, memory intact for recent and remote, normally interactive, good eye contact, not anxious appearing, and not depressed appearing.    Lab Results: Basic Metabolic Panel:  Recent Labs Lab 11/20/13 0804 11/20/13 1355 11/21/13 0348  NA 134*  --  130*  K 4.7  --  4.5  CL 101  --  99  CO2 22  --  18*  GLUCOSE 90  --  82  BUN 48*  --  57*  CREATININE 4.73*  --  5.15*  CALCIUM 9.5  --  9.2  MG  --  2.0  --   PHOS  --  3.0  --    Liver Function Tests:  Recent Labs Lab 11/20/13 1355  AST 19  ALT 14  ALKPHOS 64  BILITOT 1.2  PROT 6.2  ALBUMIN 3.2*    Recent Labs Lab 11/20/13 1500  LIPASE 14   No results found for this basename: AMMONIA,  in the last 168 hours CBC:  Recent Labs Lab 11/20/13 0804 11/21/13 0348  WBC 20.9* 17.4*  NEUTROABS 18.9*  --   HGB 9.4* 9.5*  HCT 28.2* 28.6*  MCV 92.8 92.3  PLT 105* 86*   Cardiac Enzymes:  Recent Labs Lab 11/20/13 1150 11/20/13 1825 11/20/13 2330  TROPONINI <0.30 <0.30 <0.30   BNP: No results found for this basename: PROBNP,  in the last 168 hours D-Dimer: No results found for this  basename: DDIMER,  in the last 168 hours CBG:  Recent Labs Lab 11/20/13 1600 11/20/13 2127 11/21/13 0635  GLUCAP 84 99 95   Hemoglobin A1C: No results found for this basename: HGBA1C,  in the last 168 hours Fasting Lipid Panel: No results found for this basename: CHOL, HDL, LDLCALC, TRIG, CHOLHDL, LDLDIRECT,  in the last 168 hours Thyroid Function Tests:  Recent Labs Lab 11/20/13 1355  TSH 1.457   Coagulation:  Recent Labs Lab 11/20/13 1355 11/21/13 0348  LABPROT 28.6* 25.2*  INR 2.81* 2.38*   Anemia Panel: No results found for this basename: VITAMINB12, FOLATE, FERRITIN, TIBC, IRON, RETICCTPCT,  in the last 168 hours Urine Drug Screen: Drugs of Abuse  No results found for this basename: labopia, cocainscrnur, labbenz, amphetmu, thcu, labbarb    Alcohol Level: No results found for this basename:  ETH,  in the last 168 hours Urinalysis:  Recent Labs Lab 11/20/13 0630  COLORURINE RED*  LABSPEC 1.021  PHURINE 5.5  GLUCOSEU NEGATIVE  HGBUR LARGE*  BILIRUBINUR MODERATE*  KETONESUR 15*  PROTEINUR >300*  UROBILINOGEN 1.0  NITRITE POSITIVE*  LEUKOCYTESUR LARGE*   Micro Results: Recent Results (from the past 240 hour(s))  CULTURE, BLOOD (ROUTINE X 2)     Status: None   Collection Time    11/20/13 11:15 AM      Result Value Range Status   Specimen Description BLOOD RIGHT ANTECUBITAL   Final   Special Requests BOTTLES DRAWN AEROBIC ONLY 5CC   Final   Culture  Setup Time     Final   Value: 11/20/2013 17:00     Performed at [facility]   Culture     Final   Value:        BLOOD CULTURE RECEIVED NO GROWTH TO DATE CULTURE WILL BE HELD FOR 5 DAYS BEFORE ISSUING A FINAL NEGATIVE REPORT     Performed at [facility]   Report Status PENDING   Incomplete  CULTURE, BLOOD (ROUTINE X 2)     Status: None   Collection Time    11/20/13 11:20 AM      Result Value Range Status   Specimen Description BLOOD RIGHT ARM   Final   Special Requests BOTTLES DRAWN AEROBIC ONLY 8CC   Final   Culture  Setup Time     Final   Value: 11/20/2013 16:59     Performed at [facility]   Culture     Final   Value:        BLOOD CULTURE RECEIVED NO GROWTH TO DATE CULTURE WILL BE HELD FOR 5 DAYS BEFORE ISSUING A FINAL NEGATIVE REPORT     Performed at [facility]   Report Status PENDING   Incomplete   Studies/Results: Ct Abdomen Pelvis Wo Contrast  11/20/2013   CLINICAL DATA:  Right flank pain and hematuria. History of rectal carcinoma.  EXAM: CT ABDOMEN AND PELVIS WITHOUT CONTRAST  TECHNIQUE: Multidetector CT imaging of the abdomen and pelvis was performed following the standard protocol without intravenous contrast.  COMPARISON:  CT chest, abdomen and pelvis 10/16/2012 and 10/18/2011.  FINDINGS: The patient has a small right pleural effusion. There is no left pleural effusion  or pericardial effusion. Cardiomegaly is noted. Compressive atelectasis is seen in the right lung base with due to the patient's small effusion. Calcific coronary artery disease is identified. The patient is status post CABG.  The right kidney is atrophic. No renal or ureteral stones are identified and there is no  hydronephrosis on the right or left. Two large left renal cysts are unchanged. There is stranding about the left kidney which is new since the prior CT scans.  The liver is low attenuating consistent with fatty infiltration. No focal liver lesion is seen on this uninfused examination. The gallbladder, spleen, adrenal glands and pancreas appear normal. Ventral and parastomal hernias containing loops of bowel are again identified. There is no evidence of bowel obstruction other complicating feature. Colonic diverticulosis without diverticulitis is seen. There is extensive aortoiliac atherosclerosis without aneurysm. No lymphadenopathy is identified. A small fluid collection anterior to the sacrum at the site of prior proctocolectomy and is again seen and smaller measuring 1.7 cm in diameter compared to 2.1 cm. No lytic or sclerotic bony lesion is identified.  IMPRESSION: The right kidney is atrophic but there is no hydronephrosis on the right or left and no urinary tract stones.  Stranding about the left kidney is new since the prior CT scanning and could be due to infectious or inflammatory process. Two large left renal cysts are unchanged.  No change in ventral and parastomal hernias containing loops of bowel without obstruction other complicating feature.  Diverticulosis without diverticulitis.  Small right pleural effusion with associated compressive basilar atelectasis.  Cardiomegaly.  Extensive atherosclerosis.  Fatty infiltration of the liver.   Electronically Signed   By: Drusilla Kanner M.D.   On: 11/20/2013 07:26   X-ray Chest Pa And Lateral   11/20/2013   CLINICAL DATA:  Shortness of breath   EXAM: CHEST  2 VIEW  COMPARISON:  04/08/2013  FINDINGS: Cardiac shadow is again enlarged. Postsurgical changes are seen. Lungs are well aerated bilaterally with mild interstitial change. No focal confluent infiltrate is seen. Mild changes are noted projecting over the lower lobe on the lateral projection but less well visualized on the frontal film likely lying in the right lower lobe. No other focal abnormality is noted.  IMPRESSION: Posterior lower lobe atelectasis likely on the right.   Electronically Signed   By: Alcide Clever M.D.   On: 11/20/2013 15:20   Medications: I have reviewed the patient's current medications. Scheduled Meds: . amLODipine  5 mg Oral Daily  . aspirin EC  81 mg Oral Daily  . doxazosin  8 mg Oral QHS  . finasteride  5 mg Oral Daily  . imipenem-cilastatin  250 mg Intravenous Q12H  . metoprolol succinate  25 mg Oral Daily  . pantoprazole  40 mg Oral Daily  . sodium chloride  3 mL Intravenous Q12H   Continuous Infusions: . sodium chloride 75 mL/hr at 11/21/13 0930   PRN Meds:.sodium chloride, sodium chloride Assessment/Plan: Principal Problem:   Pyelonephritis, acute Active Problems:   HYPERCHOLESTEROLEMIA   ANEMIA, IRON DEFICIENCY   HYPERTENSION   Personal history of malignant neoplasm of rectum, rectosigmoid junction, and anus   Asymptomatic PVCs   BPH (benign prostatic hyperplasia)   Acute on chronic kidney failure   Pyelonephritis  Pyelonephritis -  Pt with left sided flank pain and CVA tenderness with leukocytosis (20.9K) and UA with pyuria and hematuria and stranding about the left kidney concerning for pyelonephritis. Most likely due to obstructive left renal stone present on previous imaging that passed and caused inflammation and infection.   -Blood cultures x2 --> NGTD -Urine culture >100K E. Coli , sensitivities pending -Continue Imipenem Day 2, consider transition to PO in setting of multiple allergies -Continue to monitor CBC  AKI on CKD -  Baseline Cr 1.6-2.1.  Etiology of  CKD unknown. FeUrea indicating pre-renal azotemia. Due to uptrending Cr, most likely ATN component as well due to atrophic right kidney and possible left sided stone and pyelonephritis.  Pt on diuretic therapy for chronic LE edema. Pt received IVF 50/h x 12 hr. No recent contrast or NSAID use.  -Strict I & O's -Monitor Weight -Obtain bladder scan w/ PRV--> WNL -Monitor Cr -IVF 75 mL over 10 hrs -Hold home Lasix 40 mg daily -Avoid nephrotoxins -Consider renal US if continues to worsen  A Fib with Asymptomatic PVCs - Patient was noted to have a new onset atrial fibrillation with a slow HR in July 2014. His cardiologist decreased the Metoprolol from 50 to 25 mg po daily. He is on Xarelto 15 Qday for anticoagulation.  - Monitor on telemetry   - Troponin negative x 1  - Hold Xarelto while GFR < 15--consider resume Xarelto when GFR > 15.  - Continue ASA while Xarelto is on hold   Acute on chronic thrombocytopenia, baseline 106-160. Last PLT was 160K in Oct. 2014. PLT 105K on admission, which is likely associated with AC renal failure.  - PT/APTT --> INR 2.38 -Continue to monitor CBC  HYPERTENSION - BP range 96/39 - 147/49.  - Continue Metoprolol and Norvasc  - Hold home Lasix and ACEI in the setting of AC renal failure  - Consider stop CCB given his chronic moderate to severe ankle/feet pitting edema. Will defer it to his PCP/cardiology as outpatient.   History of fluid overload--last Echo 07/31/13 showed EF of 55% with LVH and Moderate MVR.  Patient endorses baseline SOB. He uses two pillows when sleeping and does not require additional pillows. Denies orthopnea or PND. Per chart review, his Lasix was increased in July 2014 to facilitate diuresis of 34 lbs, and his lasix was decreased to 40 daily in Oct. 2014. His weight is up 11 lbs on admission.  - Daily weight and strict I&O.  - Hold lasix in the setting of AC renal failure.  - Gentle hydration of IVF NS  750/h x 10hr - Monitor fluid status closely.   BPH (benign prostatic hyperplasia)--stable, no indication of post urinary obstruction  - continue home meds of Doxazosin and Finasteride   ANEMIA, IRON DEFICIENCY, stable at baseline. Most likely due to malabsorption in setting of colon resection. - hold iron supplement in the acute setting of infection - resume it upon discharge   Personal history of malignant neoplasm of rectum, rectosigmoid junction, and anus  Stable, managed by his oncologist Dr. Truett Perna.  - No indication of metastatic versus compression lesions on abdomen CT scan on admission   VTE: SCD. Hold Heparin in the setting of thrombocytopenia. INR 2.38      Dispo: Disposition is deferred at this time, awaiting improvement of current medical problems.  Anticipated discharge in approximately 1 day(s).   The patient does not have a current PCP Andrey Campanile Elizebeth Koller, MD) and does need an [facility] hospital follow-up appointment after discharge.  The patient does not have transportation limitations that hinder transportation to clinic appointments.  .Services Needed at time of discharge: Y = Yes, Blank = No PT:   OT:   RN:   Equipment:   Other:     LOS: 1 day   Otis Brace, MD 11/21/2013, 10:47 AM

## 2013-11-22 LAB — URINALYSIS, ROUTINE W REFLEX MICROSCOPIC
Bilirubin Urine: NEGATIVE
Nitrite: NEGATIVE
Protein, ur: 30 mg/dL — AB
Specific Gravity, Urine: 1.007 (ref 1.005–1.030)
Urobilinogen, UA: 0.2 mg/dL (ref 0.0–1.0)

## 2013-11-22 LAB — CBC WITH DIFFERENTIAL/PLATELET
Basophils Relative: 0 % (ref 0–1)
HCT: 26.6 % — ABNORMAL LOW (ref 39.0–52.0)
Hemoglobin: 9 g/dL — ABNORMAL LOW (ref 13.0–17.0)
Lymphs Abs: 0.3 10*3/uL — ABNORMAL LOW (ref 0.7–4.0)
Monocytes Relative: 6 % (ref 3–12)
Neutro Abs: 12.3 10*3/uL — ABNORMAL HIGH (ref 1.7–7.7)
Neutrophils Relative %: 89 % — ABNORMAL HIGH (ref 43–77)
Platelets: 81 10*3/uL — ABNORMAL LOW (ref 150–400)
RBC: 2.94 MIL/uL — ABNORMAL LOW (ref 4.22–5.81)

## 2013-11-22 LAB — URINE CULTURE: Colony Count: >=100000

## 2013-11-22 LAB — BASIC METABOLIC PANEL
Chloride: 100 mEq/L (ref 96–112)
Creatinine, Ser: 5.39 mg/dL — ABNORMAL HIGH (ref 0.50–1.35)
GFR calc Af Amer: 11 mL/min — ABNORMAL LOW (ref >90)
GFR calc non Af Amer: 9 mL/min — ABNORMAL LOW (ref >90)
Glucose, Bld: 92 mg/dL (ref 70–99)
Potassium: 4.4 mEq/L (ref 3.5–5.1)
Sodium: 132 mEq/L — ABNORMAL LOW (ref 135–145)

## 2013-11-22 LAB — GLUCOSE, CAPILLARY: Glucose-Capillary: 120 mg/dL — ABNORMAL HIGH (ref 70–99)

## 2013-11-22 LAB — URINE MICROSCOPIC-ADD ON

## 2013-11-22 MED ORDER — SODIUM BICARBONATE 650 MG PO TABS
650.0000 mg | ORAL_TABLET | Freq: Three times a day (TID) | ORAL | Status: AC
  Administered 2013-11-22 – 2013-11-24 (×6): 650 mg via ORAL
  Filled 2013-11-22 (×6): qty 1

## 2013-11-22 MED ORDER — SIMVASTATIN 20 MG PO TABS
20.0000 mg | ORAL_TABLET | Freq: Every day | ORAL | Status: DC
  Administered 2013-11-22 – 2013-11-25 (×4): 20 mg via ORAL
  Filled 2013-11-22 (×5): qty 1

## 2013-11-22 NOTE — Progress Notes (Addendum)
Subjective:  Pt seen and examined in AM. No acute events overnight. Pt denies fever, chills,  flank pain, hematuria, nausea, vomiting, or lightheadedness. Reports he is urinating more and color is not as dark.  Reports his LE swelling is unchanged.    Objective: Vital signs in last 24 hours: Filed Vitals:   11/21/13 1104 11/21/13 1408 11/21/13 1938 11/22/13 0422  BP: 125/53 132/85 118/74 130/51  Pulse: 67 88 73 52  Temp:  98.5 F (36.9 C) 97.7 F (36.5 C) 97.7 F (36.5 C)  TempSrc:  Axillary Oral Oral  Resp:  18 18 18   Height:      Weight:    98.2 kg (216 lb 7.9 oz)  SpO2:  100% 96% 98%   Weight change: 1 kg (2 lb 3.3 oz)  Intake/Output Summary (Last 24 hours) at 11/22/13 0733 Last data filed at 11/22/13 0600  Gross per 24 hour  Intake   1652 ml  Output   1347 ml  Net    305 ml   General: NAD Head: normocephalic and atraumatic.  Eyes: vision grossly intact, pupils equal, pupils round, pupils reactive to light, no injection and anicteric.  Mouth: pharynx pink and moist, no erythema, and no exudates.  Neck: supple, full ROM, no thyromegaly, no JVD, and no carotid bruits.  Lungs: normal respiratory effort, no accessory muscle use, normal breath sounds, no crackles, and no wheezes. Heart: irregularly irregular rhythm, no murmur, no gallop, and no rub.  Abdomen: soft, rounded, LLQ colostomy bag, non-tender, normal bowel sounds, no distention, no guarding, no rebound tenderness, no hepatomegaly, and no splenomegaly. No CVA tenderness  Msk: no joint swelling, no joint warmth, and no redness over joints.  Extremities:  B/L LE 2+ pitting edema   Neurologic: alert & oriented X3, cranial nerves II-XII intact, strength normal in all extremities, sensation intact to light touch, and gait normal.  Skin: turgor normal and no rashes.  Psych: Oriented X3, memory intact for recent and remote, normally interactive, good eye contact, not anxious appearing, and not depressed  appearing.    Lab Results: Basic Metabolic Panel:  Recent Labs Lab 11/20/13 0804 11/20/13 1355  11/21/13 2239 11/22/13 0500  NA 134*  --   < > 131* 132*  K 4.7  --   < > 4.5 4.4  CL 101  --   < > 100 100  CO2 22  --   < > 18* 16*  GLUCOSE 90  --   < > 96 92  BUN 48*  --   < > 65* 69*  CREATININE 4.73*  --   < > 5.32* 5.39*  CALCIUM 9.5  --   < > 9.3 9.1  MG  --  2.0  --   --   --   PHOS  --  3.0  --   --   --   < > = values in this interval not displayed. Liver Function Tests:  Recent Labs Lab 11/20/13 1355  AST 19  ALT 14  ALKPHOS 64  BILITOT 1.2  PROT 6.2  ALBUMIN 3.2*    Recent Labs Lab 11/20/13 1500  LIPASE 14   No results found for this basename: AMMONIA,  in the last 168 hours CBC:  Recent Labs Lab 11/20/13 0804 11/21/13 0348 11/22/13 0500  WBC 20.9* 17.4* 13.8*  NEUTROABS 18.9*  --  12.3*  HGB 9.4* 9.5* 9.0*  HCT 28.2* 28.6* 26.6*  MCV 92.8 92.3 90.5  PLT 105* 86* 81*  Cardiac Enzymes:  Recent Labs Lab 11/20/13 1150 11/20/13 1825 11/20/13 2330  TROPONINI <0.30 <0.30 <0.30   BNP: No results found for this basename: PROBNP,  in the last 168 hours D-Dimer: No results found for this basename: DDIMER,  in the last 168 hours CBG:  Recent Labs Lab 11/20/13 2127 11/21/13 0635 11/21/13 1114 11/21/13 1609 11/21/13 2121 11/22/13 0609  GLUCAP 99 95 106* 94 104* 82   Hemoglobin A1C: No results found for this basename: HGBA1C,  in the last 168 hours Fasting Lipid Panel: No results found for this basename: CHOL, HDL, LDLCALC, TRIG, CHOLHDL, LDLDIRECT,  in the last 168 hours Thyroid Function Tests:  Recent Labs Lab 11/20/13 1355  TSH 1.457   Coagulation:  Recent Labs Lab 11/20/13 1355 11/21/13 0348  LABPROT 28.6* 25.2*  INR 2.81* 2.38*   Anemia Panel: No results found for this basename: VITAMINB12, FOLATE, FERRITIN, TIBC, IRON, RETICCTPCT,  in the last 168 hours Urine Drug Screen: Drugs of Abuse  No results found for  this basename: labopia,  cocainscrnur,  labbenz,  amphetmu,  thcu,  labbarb    Alcohol Level: No results found for this basename: ETH,  in the last 168 hours Urinalysis:  Recent Labs Lab 11/20/13 0630  COLORURINE RED*  LABSPEC 1.021  PHURINE 5.5  GLUCOSEU NEGATIVE  HGBUR LARGE*  BILIRUBINUR MODERATE*  KETONESUR 15*  PROTEINUR >300*  UROBILINOGEN 1.0  NITRITE POSITIVE*  LEUKOCYTESUR LARGE*   Micro Results: Recent Results (from the past 240 hour(s))  URINE CULTURE     Status: None   Collection Time    11/20/13  6:30 AM      Result Value Range Status   Specimen Description URINE, CLEAN CATCH   Final   Special Requests NONE   Final   Culture  Setup Time     Final   Value: 11/20/2013 07:04     Performed at [facility] Count     Final   Value: >=100,000 COLONIES/ML     Performed at [facility]   Culture     Final   Value: ESCHERICHIA COLI     Performed at [facility]   Report Status 11/22/2013 FINAL   Final   Organism ID, Bacteria ESCHERICHIA COLI   Final  CULTURE, BLOOD (ROUTINE X 2)     Status: None   Collection Time    11/20/13 11:15 AM      Result Value Range Status   Specimen Description BLOOD RIGHT ANTECUBITAL   Final   Special Requests BOTTLES DRAWN AEROBIC ONLY 5CC   Final   Culture  Setup Time     Final   Value: 11/20/2013 17:00     Performed at [facility]   Culture     Final   Value:        BLOOD CULTURE RECEIVED NO GROWTH TO DATE CULTURE WILL BE HELD FOR 5 DAYS BEFORE ISSUING A FINAL NEGATIVE REPORT     Performed at [facility]   Report Status PENDING   Incomplete  CULTURE, BLOOD (ROUTINE X 2)     Status: None   Collection Time    11/20/13 11:20 AM      Result Value Range Status   Specimen Description BLOOD RIGHT ARM   Final   Special Requests BOTTLES DRAWN AEROBIC ONLY 8CC   Final   Culture  Setup Time     Final   Value: 11/20/2013 16:59     Performed  at [facility]     Final    Value:        BLOOD CULTURE RECEIVED NO GROWTH TO DATE CULTURE WILL BE HELD FOR 5 DAYS BEFORE ISSUING A FINAL NEGATIVE REPORT     Performed at [facility]   Report Status PENDING   Incomplete   Studies/Results: X-ray Chest Pa And Lateral   11/20/2013   CLINICAL DATA:  Shortness of breath  EXAM: CHEST  2 VIEW  COMPARISON:  04/08/2013  FINDINGS: Cardiac shadow is again enlarged. Postsurgical changes are seen. Lungs are well aerated bilaterally with mild interstitial change. No focal confluent infiltrate is seen. Mild changes are noted projecting over the lower lobe on the lateral projection but less well visualized on the frontal film likely lying in the right lower lobe. No other focal abnormality is noted.  IMPRESSION: Posterior lower lobe atelectasis likely on the right.   Electronically Signed   By: Alcide Clever M.D.   On: 11/20/2013 15:20   Medications: I have reviewed the patient's current medications. Scheduled Meds: . amLODipine  5 mg Oral Daily  . aspirin EC  81 mg Oral Daily  . doxazosin  8 mg Oral QHS  . finasteride  5 mg Oral Daily  . imipenem-cilastatin  250 mg Intravenous Q12H  . metoprolol succinate  25 mg Oral Daily  . pantoprazole  40 mg Oral Daily  . sodium chloride  3 mL Intravenous Q12H   Continuous Infusions:   PRN Meds:.sodium chloride, sodium chloride Assessment/Plan: Principal Problem:   Pyelonephritis, acute Active Problems:   HYPERCHOLESTEROLEMIA   ANEMIA, IRON DEFICIENCY   HYPERTENSION   Personal history of malignant neoplasm of rectum, rectosigmoid junction, and anus   Asymptomatic PVCs   BPH (benign prostatic hyperplasia)   Acute on chronic kidney failure   Pyelonephritis    AKI on CKD with metabolic acidosis  - Cr uptrending despite fluid challenge. Baseline Cr 1.6-2.1.  Etiology of CKD unknown. FeUrea (32.2) indicating pre-renal azotemia. Due to uptrending Cr, most likely ATN component (BUN/Cr: 10.1) as well due to atrophic right kidney  and possible left sided stone that passed and pyelonephritis. Bladder scan on 11/26 with minimal PRV (0-22) so post-renal etiology unlikely, however pt with BPH that is medically managed. Pt on diuretic therapy for chronic LE edema, etiology unknown. Pt received IVF 50/h x 12 hr on 11/25 and 13mL/hr x 10 hr on 11/26 without improvement of renal function.  No recent contrast or NSAID use.  -Strict I & O's--> Uoutput 1.3L  -Monitor Weight --> 210 lb to 216 lb since admission -Obtain bladder scan w/ PRV--> WNL -Monitor Cr--> 4.73 on admission to 5.32  -Hold home Lasix 40 mg daily -Avoid nephrotoxins - hold home ACEi, avoid NSAIDs, ARB, contrast -Consider renal US if continues to worsen -Monitor bicarbonate --> 22 on admission to 18 -Nephrology consult   Pyelonephritis -  Afebrile with improving leukocytosis.Pt with left sided flank pain and CVA tenderness with leukocytosis (20.9K) and UA with pyuria and hematuria and stranding about the left kidney concerning for pyelonephritis. Most likely due to obstructive left renal stone present on previous imaging that passed and caused inflammation and infection.    -Blood cultures x2 --> NGTD -Urine culture >100K E. Coli , pansensitive (except to ampicillin)  -Continue Imipenem Day 3 -due to multiple allergies, per ID no other good options, renal function also worsening  -Continue to monitor CBC--> downtrending  History of fluid overload - currently  with fluid overload--last Echo 07/31/13 showed EF of 55% with LVH and Moderate MVR. Per records he has history of compensated heart failure. Patient endorses baseline dyspnea with exertion, denies  orthopnea (two pillows) or PND. Per chart review, his Lasix was increased in July 2014 to facilitate diuresis of 34 lbs, and his lasix was decreased to 40 daily in Oct. 2014. His weight is up 11 lbs on admission. Pt received gentile IV hydration in setting of renal failure.   -Monitor daily weight --> 210 lb to 216 lb  since admission -Consider restarting lasix 40 mg daily --> wt gain since admission    A Fib with Asymptomatic PVCs - Currently in NSR. Patient was noted to have a new onset atrial fibrillation with a slow HR in July 2014. His cardiologist decreased the Metoprolol from 50 to 25 mg po daily. He is on Xarelto 15 Qday for anticoagulation.  - Monitor on telemetry   - Troponin negative x 1  - Hold Xarelto while GFR < 15--consider resume Xarelto when GFR > 15.  - Continue ASA while Xarelto is on hold   Acute on chronic thrombocytopenia - stable. Baseline 106-160. Last PLT was 160K in Oct. 2014. PLT 105K on admission.  - PT/APTT --> INR 2.38 -Continue to monitor CBC  Hypertension - currently normotensive. BP range 96/39 - 147/49.  -Continue metoprolol succinate 24 mg daily  -Hold amlodipine-benazepril 5-20 mg daily in setting of AKI  Hyperlipidemia - Last lipid panel on 06/11/13 within normal limits. At home on simvastatin.  -Restart home Simvastatin 20 mg daily   BPH (benign prostatic hyperplasia)--stable, no indication of post urinary obstruction. Follows with urologist in [place].   -Continue home doxazosin 8 mg daily  -Continue home finasteride 5 mg daily   Iron Deficicency Anemia -  stable at baseline. Last anemia panel on 04/19/13 within normal limits. Ferritin on 10/21 within normal limits.  Most likely due to malabsorption in setting of colon resection. - Hold iron supplement in the acute setting of infection - Resume upon discharge   Personal history of malignant neoplasm of rectum, rectosigmoid junction, and anus  Stable, managed by his oncologist Dr. Truett Perna.  - No indication of metastatic versus compression lesions on abdomen CT scan on admission   GERD - No reports of acid reflux symptoms -Continue home protonix 40 mg daily   VTE: SCDs (Hold Heparin in the setting of thrombocytopenia. INR 2.38 )     Dispo: Disposition is deferred at this time, awaiting improvement of  current medical problems.  Anticipated discharge in approximately 1 day(s).   The patient does not have a current PCP Andrey Campanile Elizebeth Koller, MD) and does need an [facility] hospital follow-up appointment after discharge.  The patient does not have transportation limitations that hinder transportation to clinic appointments.  .Services Needed at time of discharge: Y = Yes, Blank = No PT:   OT:   RN:   Equipment:   Other:     LOS: 2 days   Otis Brace, MD 11/22/2013, 7:33 AM

## 2013-11-22 NOTE — Consult Note (Signed)
Reason for Consult: Acute renal failure on chronic kidney disease stage IV Referring Physician: Dr. 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 Presented to the emergency room 2 days ago with hematuria and left-sided flank pain. He reports preceding sudden left flank pain one day prior to developing hematuria that apparently resolved after about 12 hours. He did not see any kidney stones passed. He does not have knowledge of chronic kidney disease and states that he sees his urologist once a year. He denies any history of foamy urine and has not had hematuria before this.  Denies any recent intravenous contrast exposure and reports taking 2 tablets of naproxen for his flank pain prior to presentation. Medications prior to admission reviewed-noted he is on Benazepril and furosemide as well as potassium supplement. Since admission, he has cautiously received intravenous fluids with paradoxical worsening of renal function as well as metabolic acidosis.   07/10/2013  07/31/2013  11/20/2013  11/21/2013  11/21/2013  11/22/2013   BUN 21 24 (H) 48 (H) 57 (H) 65 (H) 69 (H)  Creatinine 2.1 (H) 2.1 (H) 4.73 (H) 5.15 (H) 5.32 (H) 5.39 (H)    Past Medical History  Diagnosis Date  . 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 Pure hypercholesterolemia     takes Vytorin daily  . Personal history of colonic polyps     adenomatous  . Diverticulosis of colon (without mention of hemorrhage)   . Cardiovascular disease   . IHD (ischemic heart disease)   . History of colon cancer   . Colon cancer   . Hypertension     takes Amlodipine,Metoprolol,and Cardura daily  .  Coronary artery disease   . Acute myocardial infarction, unspecified site, episode of care unspecified   . MI (myocardial infarction) 1993  . CHF (congestive heart failure)     takes Lasix daily  . H/O blood clots 1993  . Dysrhythmia   . Shortness of breath     with exertion  . Arthritis   . Joint pain   . Joint swelling   . Edema     to right leg-below knee;not any different than when he saw brackbill in jan 2013  . Bruises easily     takes ASA daily  . Skin cancer     nose  . GERD (gastroesophageal reflux disease)     takes Protonix daily  . Diarrhea   . Constipation   . Enlarged prostate     takes Finasteride daily  . Blood transfusion 2008  . Insomnia   . Hx of CABG     1993  . Ejection fraction     EF 51%, nuclear, 2010    Past Surgical History  Procedure Laterality Date  . Shoulder surgery  2007    left  . Cataract extraction      right  . Abdominoperineal proctocolectomy  06/01/2007  . Coronary angioplasty  05/22/92  . Colostomy  2008  . Colonoscopy    . Coronary artery bypass graft  1993  . Colon surgery  2008    colectomy  . Knee arthroscopy  2010    right   . Total knee arthroplasty  02/28/2012    Procedure: TOTAL KNEE  ARTHROPLASTY;  Surgeon: Raymon Mutton, MD;  Location: [facility] OR;  Service: Orthopedics;  Laterality: Right;    Family History  Problem Relation Age of Onset  . Pancreatic cancer Father   . Diabetes Son   . Emphysema Mother   . Anesthesia problems Neg Hx   . Hypotension Neg Hx   . Malignant hyperthermia Neg Hx   . Pseudochol deficiency Neg Hx     Social History:  reports that he quit smoking about 22 years ago. His smoking use included Pipe and Cigars. His smokeless tobacco use includes Chew. He reports that he does not drink alcohol or use illicit drugs.  Allergies:  Allergies  Allergen Reactions  . Cephalexin   . Ciprofloxacin Other (See Comments)    Reaction unknown  . Hctz [Hydrochlorothiazide]     DIZZINESS   .  Levofloxacin   . Lipitor [Atorvastatin Calcium]     MUSCLE PAIN  . Niaspan [Niacin Er]     FLUSHING   . Oxycodone-Acetaminophen Other (See Comments)    Reaction unknown  . Sulfonamide Derivatives Other (See Comments)    Reaction unkonwn    Medications:  Scheduled: . amLODipine  5 mg Oral Daily  . aspirin EC  81 mg Oral Daily  . doxazosin  8 mg Oral QHS  . finasteride  5 mg Oral Daily  . imipenem-cilastatin  250 mg Intravenous Q12H  . metoprolol succinate  25 mg Oral Daily  . pantoprazole  40 mg Oral Daily  . simvastatin  20 mg Oral q1800  . sodium chloride  3 mL Intravenous Q12H    Results for orders placed during the hospital encounter of 11/20/13 (from the past 48 hour(s))  TROPONIN I     Status: None   Collection Time    11/20/13 11:50 AM      Result Value Range   Troponin I <0.30  <0.30 ng/mL   Comment:            Due to the release kinetics of cTnI,     a negative result within the first hours     of the onset of symptoms does not rule out     myocardial infarction with certainty.     If myocardial infarction is still suspected,     repeat the test at appropriate intervals.  HEPATIC FUNCTION PANEL     Status: Abnormal   Collection Time    11/20/13  1:55 PM      Result Value Range   Total Protein 6.2  6.0 - 8.3 g/dL   Albumin 3.2 (*) 3.5 - 5.2 g/dL   AST 19  0 - 37 U/L   ALT 14  0 - 53 U/L   Alkaline Phosphatase 64  39 - 117 U/L   Total Bilirubin 1.2  0.3 - 1.2 mg/dL   Bilirubin, Direct 0.6 (*) 0.0 - 0.3 mg/dL   Indirect Bilirubin 0.6  0.3 - 0.9 mg/dL  MAGNESIUM     Status: None   Collection Time    11/20/13  1:55 PM      Result Value Range   Magnesium 2.0  1.5 - 2.5 mg/dL  PHOSPHORUS     Status: None   Collection Time    11/20/13  1:55 PM      Result Value Range   Phosphorus 3.0  2.3 - 4.6 mg/dL  PROTIME-INR     Status: Abnormal   Collection Time    11/20/13  1:55 PM  Result Value Range   Prothrombin Time 28.6 (*) 11.6 - 15.2 seconds   INR  2.81 (*) 0.00 - 1.49  TSH     Status: None   Collection Time    11/20/13  1:55 PM      Result Value Range   TSH 1.457  0.350 - 4.500 uIU/mL   Comment: Performed at [facility]  APTT     Status: Abnormal   Collection Time    11/20/13  1:55 PM      Result Value Range   aPTT 45 (*) 24 - 37 seconds   Comment:            IF BASELINE aPTT IS ELEVATED,     SUGGEST PATIENT RISK ASSESSMENT     BE USED TO DETERMINE APPROPRIATE     ANTICOAGULANT THERAPY.  LACTIC ACID, PLASMA     Status: None   Collection Time    11/20/13  2:36 PM      Result Value Range   Lactic Acid, Venous 1.6  0.5 - 2.2 mmol/L  LIPASE, BLOOD     Status: None   Collection Time    11/20/13  3:00 PM      Result Value Range   Lipase 14  11 - 59 U/L  GLUCOSE, CAPILLARY     Status: None   Collection Time    11/20/13  4:00 PM      Result Value Range   Glucose-Capillary 84  70 - 99 mg/dL  TROPONIN I     Status: None   Collection Time    11/20/13  6:25 PM      Result Value Range   Troponin I <0.30  <0.30 ng/mL   Comment:            Due to the release kinetics of cTnI,     a negative result within the first hours     of the onset of symptoms does not rule out     myocardial infarction with certainty.     If myocardial infarction is still suspected,     repeat the test at appropriate intervals.  GLUCOSE, CAPILLARY     Status: None   Collection Time    11/20/13  9:27 PM      Result Value Range   Glucose-Capillary 99  70 - 99 mg/dL  TROPONIN I     Status: None   Collection Time    11/20/13 11:30 PM      Result Value Range   Troponin I <0.30  <0.30 ng/mL   Comment:            Due to the release kinetics of cTnI,     a negative result within the first hours     of the onset of symptoms does not rule out     myocardial infarction with certainty.     If myocardial infarction is still suspected,     repeat the test at appropriate intervals.  BASIC METABOLIC PANEL     Status: Abnormal   Collection Time     11/21/13  3:48 AM      Result Value Range   Sodium 130 (*) 135 - 145 mEq/L   Potassium 4.5  3.5 - 5.1 mEq/L   Chloride 99  96 - 112 mEq/L   CO2 18 (*) 19 - 32 mEq/L   Glucose, Bld 82  70 - 99 mg/dL   BUN 57 (*) 6 - 23 mg/dL   Creatinine,  Ser 5.15 (*) 0.50 - 1.35 mg/dL   Calcium 9.2  8.4 - 47.8 mg/dL   GFR calc non Af Amer 10 (*) >90 mL/min   GFR calc Af Amer 11 (*) >90 mL/min   Comment: (NOTE)     The eGFR has been calculated using the CKD EPI equation.     This calculation has not been validated in all clinical situations.     eGFR's persistently <90 mL/min signify possible Chronic Kidney     Disease.  CBC     Status: Abnormal   Collection Time    11/21/13  3:48 AM      Result Value Range   WBC 17.4 (*) 4.0 - 10.5 K/uL   RBC 3.10 (*) 4.22 - 5.81 MIL/uL   Hemoglobin 9.5 (*) 13.0 - 17.0 g/dL   HCT 29.5 (*) 62.1 - 30.8 %   MCV 92.3  78.0 - 100.0 fL   MCH 30.6  26.0 - 34.0 pg   MCHC 33.2  30.0 - 36.0 g/dL   RDW 65.7 (*) 84.6 - 96.2 %   Platelets 86 (*) 150 - 400 K/uL   Comment: CONSISTENT WITH PREVIOUS RESULT  APTT     Status: Abnormal   Collection Time    11/21/13  3:48 AM      Result Value Range   aPTT 45 (*) 24 - 37 seconds   Comment:            IF BASELINE aPTT IS ELEVATED,     SUGGEST PATIENT RISK ASSESSMENT     BE USED TO DETERMINE APPROPRIATE     ANTICOAGULANT THERAPY.  PROTIME-INR     Status: Abnormal   Collection Time    11/21/13  3:48 AM      Result Value Range   Prothrombin Time 25.2 (*) 11.6 - 15.2 seconds   INR 2.38 (*) 0.00 - 1.49  GLUCOSE, CAPILLARY     Status: None   Collection Time    11/21/13  6:35 AM      Result Value Range   Glucose-Capillary 95  70 - 99 mg/dL  GLUCOSE, CAPILLARY     Status: Abnormal   Collection Time    11/21/13 11:14 AM      Result Value Range   Glucose-Capillary 106 (*) 70 - 99 mg/dL  GLUCOSE, CAPILLARY     Status: None   Collection Time    11/21/13  4:09 PM      Result Value Range   Glucose-Capillary 94  70 - 99 mg/dL   BASIC METABOLIC PANEL     Status: Abnormal   Collection Time    11/21/13  8:16 PM      Result Value Range   Sodium 133 (*) 135 - 145 mEq/L   Potassium 5.3 (*) 3.5 - 5.1 mEq/L   Comment: HEMOLYSIS AT THIS LEVEL MAY AFFECT RESULT   Chloride 102  96 - 112 mEq/L   CO2 18 (*) 19 - 32 mEq/L   Glucose, Bld 99  70 - 99 mg/dL   BUN 64 (*) 6 - 23 mg/dL   Creatinine, Ser 9.52 (*) 0.50 - 1.35 mg/dL   Calcium 9.2  8.4 - 84.1 mg/dL   GFR calc non Af Amer 9 (*) >90 mL/min   GFR calc Af Amer 11 (*) >90 mL/min   Comment: (NOTE)     The eGFR has been calculated using the CKD EPI equation.     This calculation has not been validated in all clinical  situations.     eGFR's persistently <90 mL/min signify possible Chronic Kidney     Disease.  GLUCOSE, CAPILLARY     Status: Abnormal   Collection Time    11/21/13  9:21 PM      Result Value Range   Glucose-Capillary 104 (*) 70 - 99 mg/dL  BASIC METABOLIC PANEL     Status: Abnormal   Collection Time    11/21/13 10:39 PM      Result Value Range   Sodium 131 (*) 135 - 145 mEq/L   Potassium 4.5  3.5 - 5.1 mEq/L   Chloride 100  96 - 112 mEq/L   CO2 18 (*) 19 - 32 mEq/L   Glucose, Bld 96  70 - 99 mg/dL   BUN 65 (*) 6 - 23 mg/dL   Creatinine, Ser 1.61 (*) 0.50 - 1.35 mg/dL   Calcium 9.3  8.4 - 09.6 mg/dL   GFR calc non Af Amer 9 (*) >90 mL/min   GFR calc Af Amer 11 (*) >90 mL/min   Comment: (NOTE)     The eGFR has been calculated using the CKD EPI equation.     This calculation has not been validated in all clinical situations.     eGFR's persistently <90 mL/min signify possible Chronic Kidney     Disease.  BASIC METABOLIC PANEL     Status: Abnormal   Collection Time    11/22/13  5:00 AM      Result Value Range   Sodium 132 (*) 135 - 145 mEq/L   Potassium 4.4  3.5 - 5.1 mEq/L   Chloride 100  96 - 112 mEq/L   CO2 16 (*) 19 - 32 mEq/L   Glucose, Bld 92  70 - 99 mg/dL   BUN 69 (*) 6 - 23 mg/dL   Creatinine, Ser 0.45 (*) 0.50 - 1.35 mg/dL    Calcium 9.1  8.4 - 40.9 mg/dL   GFR calc non Af Amer 9 (*) >90 mL/min   GFR calc Af Amer 11 (*) >90 mL/min   Comment: (NOTE)     The eGFR has been calculated using the CKD EPI equation.     This calculation has not been validated in all clinical situations.     eGFR's persistently <90 mL/min signify possible Chronic Kidney     Disease.  CBC WITH DIFFERENTIAL     Status: Abnormal   Collection Time    11/22/13  5:00 AM      Result Value Range   WBC 13.8 (*) 4.0 - 10.5 K/uL   RBC 2.94 (*) 4.22 - 5.81 MIL/uL   Hemoglobin 9.0 (*) 13.0 - 17.0 g/dL   HCT 81.1 (*) 91.4 - 78.2 %   MCV 90.5  78.0 - 100.0 fL   MCH 30.6  26.0 - 34.0 pg   MCHC 33.8  30.0 - 36.0 g/dL   RDW 95.6 (*) 21.3 - 08.6 %   Platelets 81 (*) 150 - 400 K/uL   Comment: CONSISTENT WITH PREVIOUS RESULT   Neutrophils Relative % 89 (*) 43 - 77 %   Neutro Abs 12.3 (*) 1.7 - 7.7 K/uL   Lymphocytes Relative 2 (*) 12 - 46 %   Lymphs Abs 0.3 (*) 0.7 - 4.0 K/uL   Monocytes Relative 6  3 - 12 %   Monocytes Absolute 0.9  0.1 - 1.0 K/uL   Eosinophils Relative 2  0 - 5 %   Eosinophils Absolute 0.3  0.0 - 0.7 K/uL  Basophils Relative 0  0 - 1 %   Basophils Absolute 0.0  0.0 - 0.1 K/uL  GLUCOSE, CAPILLARY     Status: None   Collection Time    11/22/13  6:09 AM      Result Value Range   Glucose-Capillary 82  70 - 99 mg/dL  GLUCOSE, CAPILLARY     Status: Abnormal   Collection Time    11/22/13 11:27 AM      Result Value Range   Glucose-Capillary 120 (*) 70 - 99 mg/dL   Comment 1 Notify RN      X-ray Chest Pa And Lateral   11/20/2013   CLINICAL DATA:  Shortness of breath  EXAM: CHEST  2 VIEW  COMPARISON:  04/08/2013  FINDINGS: Cardiac shadow is again enlarged. Postsurgical changes are seen. Lungs are well aerated bilaterally with mild interstitial change. No focal confluent infiltrate is seen. Mild changes are noted projecting over the lower lobe on the lateral projection but less well visualized on the frontal film likely lying in  the right lower lobe. No other focal abnormality is noted.  IMPRESSION: Posterior lower lobe atelectasis likely on the right.   Electronically Signed   By: Alcide Clever M.D.   On: 11/20/2013 15:20    Review of Systems  Constitutional: Positive for malaise/fatigue. Negative for fever, chills, weight loss and diaphoresis.  HENT: Negative.   Respiratory: Negative.   Cardiovascular: Positive for leg swelling.       Reports leg swelling for 10-15 years  Gastrointestinal: Positive for nausea. Negative for heartburn, vomiting, abdominal pain, diarrhea, constipation, blood in stool and melena.       Status post colostomy left lower quadrant  Genitourinary: Positive for dysuria, hematuria and flank pain. Negative for urgency and frequency.       Resolving hematuria-resolved flank pain at this time  Musculoskeletal: Negative.   Skin: Negative.   Neurological: Negative.  Negative for weakness.  Endo/Heme/Allergies: Negative.   Psychiatric/Behavioral: Negative.    Blood pressure 130/51, pulse 52, temperature 97.7 F (36.5 C), temperature source Oral, resp. rate 18, height 5\' 10"  (1.778 m), weight 98.2 kg (216 lb 7.9 oz), SpO2 98.00%. Physical Exam  Nursing note and vitals reviewed. Constitutional: He is oriented to person, place, and time. He appears well-developed and well-nourished. No distress.  HENT:  Head: Normocephalic and atraumatic.  Nose: Nose normal.  Mouth/Throat: No oropharyngeal exudate.  Eyes: EOM are normal. Pupils are equal, round, and reactive to light. No scleral icterus.  Neck: Normal range of motion. Neck supple. No JVD present. No tracheal deviation present. No thyromegaly present.  Cardiovascular: Normal rate.  Exam reveals no friction rub.   No murmur heard. Irregularly irregular heart rhythm  Respiratory: Effort normal and breath sounds normal. No respiratory distress. He has no wheezes. He has no rales.  GI: Soft. Bowel sounds are normal. He exhibits distension. There  is no tenderness. There is no rebound and no guarding.  Left lower quadrant colostomy  Musculoskeletal: Normal range of motion. He exhibits edema.  2-3+ pitting edema from the feet to the thighs  Lymphadenopathy:    He has no cervical adenopathy.  Neurological: He is alert and oriented to person, place, and time.  Skin: Skin is warm and dry. No rash noted. No erythema.  Psychiatric: He has a normal mood and affect. His behavior is normal.    Assessment/Plan: 1. Acute renal failure chronic kidney disease stage IV: Data base and timeline of events pointing to possible acute  tubular necrosis/pyelonephritis associated AIN in this patient who likely has a solitary functioning left kidney. Continue treatment of infection with imipenem as currently being done as we engage in supportive management. Clinically, does not appear to be volume depleted (extensive and chronic pedal edema present) and mucosal membranes moist. He did have transient hypotension on 11/20/2013 with systolic prep pressures in the 90s and diastolics in the 30s to 40s that may have been the reason for an ischemic-type ATN. We'll start him on oral sodium bicarbonate therapy for metabolic acidosis. Continue to avoid nephrotoxins as you're currently doing and monitor input/output strictly. No indications at this time for dialysis or acute interventions. His hematuria and proteinuria trace concerns of a glomerulonephritis however these are most likely from concomitant pyelonephritis. 2. Pyelonephritis: Escherichia coli urinary tract infection however patient allergic or intolerant of several antibiotics-currently receiving Primaxin. 3. Metabolic acidosis: Most likely secondary to acute renal failure, we'll begin oral sodium bicarbonate therapy as intravenous fluids may be fraught with risk of volume overload. 4. Anemia: Possibly anemia from chronic kidney disease-check iron stores and undertake replacement after infection controlled. No  indications for PRBCs at this time.   Cherrell Maybee K. 11/22/2013, 11:40 AM

## 2013-11-23 LAB — RENAL FUNCTION PANEL
BUN: 79 mg/dL — ABNORMAL HIGH (ref 6–23)
CO2: 20 mEq/L (ref 19–32)
Calcium: 9.4 mg/dL (ref 8.4–10.5)
Chloride: 104 mEq/L (ref 96–112)
Creatinine, Ser: 5.6 mg/dL — ABNORMAL HIGH (ref 0.50–1.35)
Glucose, Bld: 97 mg/dL (ref 70–99)
Phosphorus: 4.3 mg/dL (ref 2.3–4.6)
Sodium: 136 mEq/L (ref 135–145)

## 2013-11-23 LAB — CBC WITH DIFFERENTIAL/PLATELET
Basophils Absolute: 0 10*3/uL (ref 0.0–0.1)
Basophils Relative: 0 % (ref 0–1)
Eosinophils Relative: 3 % (ref 0–5)
HCT: 27.1 % — ABNORMAL LOW (ref 39.0–52.0)
MCHC: 33.9 g/dL (ref 30.0–36.0)
MCV: 89.1 fL (ref 78.0–100.0)
Monocytes Absolute: 1 10*3/uL (ref 0.1–1.0)
RBC: 3.04 MIL/uL — ABNORMAL LOW (ref 4.22–5.81)
RDW: 17.7 % — ABNORMAL HIGH (ref 11.5–15.5)

## 2013-11-23 LAB — GLUCOSE, CAPILLARY
Glucose-Capillary: 119 mg/dL — ABNORMAL HIGH (ref 70–99)
Glucose-Capillary: 104 mg/dL — ABNORMAL HIGH (ref 70–99)

## 2013-11-23 LAB — FERRITIN: Ferritin: 260 ng/mL (ref 22–322)

## 2013-11-23 NOTE — Progress Notes (Addendum)
Subjective:  Pt seen and examined in AM. No acute events overnight. Pt denies fever, chills, rashes, joint pain,  flank pain, hematuria, nausea, vomiting, or lightheadedness. Reports he is urinating more with no evidence of hemturia.  He is not sure if his LE edema  has improved or worsened. Pt would to like ambulate as he is tired of sitting in the chair.    Objective: Vital signs in last 24 hours: Filed Vitals:   11/22/13 1345 11/22/13 2137 11/23/13 0500 11/23/13 0629  BP: 126/55 140/56  117/55  Pulse: 75 71  103  Temp: 98.1 F (36.7 C) 98.3 F (36.8 C)  98 F (36.7 C)  TempSrc: Oral Oral  Oral  Resp: 18 18  18   Height:      Weight:   99.4 kg (219 lb 2.2 oz)   SpO2: 97% 99%  97%   Weight change: 1.2 kg (2 lb 10.3 oz)  Intake/Output Summary (Last 24 hours) at 11/23/13 0904 Last data filed at 11/23/13 0834  Gross per 24 hour  Intake    483 ml  Output   1250 ml  Net   -767 ml   General: NAD Head: normocephalic and atraumatic.  Eyes: vision grossly intact, pupils equal, pupils round, pupils reactive to light, no injection and anicteric.  Mouth: pharynx pink and moist, no erythema, and no exudates.  Neck: supple, full ROM, no thyromegaly, no JVD, and no carotid bruits.  Lungs: normal respiratory effort, no accessory muscle use, normal breath sounds, no crackles, and no wheezes. Heart: irregularly irregular rhythm, no murmur, no gallop, and no rub.  Abdomen: soft, rounded, LLQ colostomy bag, non-tender, normal bowel sounds, no distention, no guarding, no rebound tenderness, no hepatomegaly, and no splenomegaly. No CVA tenderness  Msk: no joint swelling, no joint warmth, and no redness over joints.  Extremities:  B/L LE 2+ pitting edema   Neurologic: alert & oriented X3, cranial nerves II-XII intact, strength normal in all extremities, sensation intact to light touch, and gait normal.  Skin: turgor normal and no rashes. Multiple lesions on face Psych: Oriented X3, memory  intact for recent and remote, normally interactive, good eye contact, not anxious appearing, and not depressed appearing.    Lab Results: Basic Metabolic Panel:  Recent Labs Lab 11/20/13 1355  11/22/13 0500 11/23/13 0557  NA  --   < > 132* 136  K  --   < > 4.4 4.6  CL  --   < > 100 104  CO2  --   < > 16* 20  GLUCOSE  --   < > 92 97  BUN  --   < > 69* 79*  CREATININE  --   < > 5.39* 5.60*  CALCIUM  --   < > 9.1 9.4  MG 2.0  --   --  2.4  PHOS 3.0  --   --  4.3  < > = values in this interval not displayed. Liver Function Tests:  Recent Labs Lab 11/20/13 1355 11/23/13 0557  AST 19  --   ALT 14  --   ALKPHOS 64  --   BILITOT 1.2  --   PROT 6.2  --   ALBUMIN 3.2* 2.6*    Recent Labs Lab 11/20/13 1500  LIPASE 14   No results found for this basename: AMMONIA,  in the last 168 hours CBC:  Recent Labs Lab 11/22/13 0500 11/23/13 0557  WBC 13.8* 11.5*  NEUTROABS 12.3* 9.7*  HGB  9.0* 9.2*  HCT 26.6* 27.1*  MCV 90.5 89.1  PLT 81* 93*   Cardiac Enzymes:  Recent Labs Lab 11/20/13 1150 11/20/13 1825 11/20/13 2330  TROPONINI <0.30 <0.30 <0.30   BNP: No results found for this basename: PROBNP,  in the last 168 hours D-Dimer: No results found for this basename: DDIMER,  in the last 168 hours CBG:  Recent Labs Lab 11/21/13 2121 11/22/13 0609 11/22/13 1127 11/22/13 1623 11/22/13 2136 11/23/13 0628  GLUCAP 104* 82 120* 92 105* 94   Hemoglobin A1C: No results found for this basename: HGBA1C,  in the last 168 hours Fasting Lipid Panel: No results found for this basename: CHOL, HDL, LDLCALC, TRIG, CHOLHDL, LDLDIRECT,  in the last 168 hours Thyroid Function Tests:  Recent Labs Lab 11/20/13 1355  TSH 1.457   Coagulation:  Recent Labs Lab 11/20/13 1355 11/21/13 0348  LABPROT 28.6* 25.2*  INR 2.81* 2.38*   Anemia Panel:  Recent Labs Lab 11/23/13 0557  RETICCTPCT 0.8   Urine Drug Screen: Drugs of Abuse  No results found for this  basename: labopia,  cocainscrnur,  labbenz,  amphetmu,  thcu,  labbarb    Alcohol Level: No results found for this basename: ETH,  in the last 168 hours Urinalysis:  Recent Labs Lab 11/20/13 0630 11/22/13 1504  COLORURINE RED* YELLOW  LABSPEC 1.021 1.007  PHURINE 5.5 5.5  GLUCOSEU NEGATIVE NEGATIVE  HGBUR LARGE* LARGE*  BILIRUBINUR MODERATE* NEGATIVE  KETONESUR 15* NEGATIVE  PROTEINUR >300* 30*  UROBILINOGEN 1.0 0.2  NITRITE POSITIVE* NEGATIVE  LEUKOCYTESUR LARGE* LARGE*   Micro Results: Recent Results (from the past 240 hour(s))  URINE CULTURE     Status: None   Collection Time    11/20/13  6:30 AM      Result Value Range Status   Specimen Description URINE, CLEAN CATCH   Final   Special Requests NONE   Final   Culture  Setup Time     Final   Value: 11/20/2013 07:04     Performed at [facility] Count     Final   Value: >=100,000 COLONIES/ML     Performed at [facility]   Culture     Final   Value: ESCHERICHIA COLI     Performed at [facility]   Report Status 11/22/2013 FINAL   Final   Organism ID, Bacteria ESCHERICHIA COLI   Final  CULTURE, BLOOD (ROUTINE X 2)     Status: None   Collection Time    11/20/13 11:15 AM      Result Value Range Status   Specimen Description BLOOD RIGHT ANTECUBITAL   Final   Special Requests BOTTLES DRAWN AEROBIC ONLY 5CC   Final   Culture  Setup Time     Final   Value: 11/20/2013 17:00     Performed at [facility]   Culture     Final   Value: GRAM NEGATIVE RODS     1610 Note: Gram Stain Report Called to,Read Back By and Verified With: Gerrit Halls 11/20/2013 FULKC     Performed at [facility]   Report Status PENDING   Incomplete  CULTURE, BLOOD (ROUTINE X 2)     Status: None   Collection Time    11/20/13 11:20 AM      Result Value Range Status   Specimen Description BLOOD RIGHT ARM   Final   Special Requests BOTTLES DRAWN AEROBIC ONLY 8CC   Final   Culture  Setup Time      Final   Value: 11/20/2013 16:59     Performed at [facility]   Culture     Final   Value: GRAM NEGATIVE RODS     Note: Gram Stain Report Called to,Read Back By and Verified With: BRITTANY B @ [phone] 11/23/13 BY KRAWS     Performed at [facility]   Report Status PENDING   Incomplete   Studies/Results: No results found. Medications: I have reviewed the patient's current medications. Scheduled Meds: . amLODipine  5 mg Oral Daily  . aspirin EC  81 mg Oral Daily  . doxazosin  8 mg Oral QHS  . finasteride  5 mg Oral Daily  . imipenem-cilastatin  250 mg Intravenous Q12H  . metoprolol succinate  25 mg Oral Daily  . simvastatin  20 mg Oral q1800  . sodium bicarbonate  650 mg Oral TID  . sodium chloride  3 mL Intravenous Q12H   Continuous Infusions:   PRN Meds:.sodium chloride, sodium chloride Assessment/Plan: Principal Problem:   Pyelonephritis, acute Active Problems:   HYPERCHOLESTEROLEMIA   ANEMIA, IRON DEFICIENCY   HYPERTENSION   Personal history of malignant neoplasm of rectum, rectosigmoid junction, and anus   Asymptomatic PVCs   BPH (benign prostatic hyperplasia)   Acute on chronic kidney failure   Pyelonephritis    AKI on CKD with metabolic acidosis  - nonoliguric, Cr uptrending, acidosis resolved. Baseline Cr 1.6-2.1.  Etiology of CKD unknown. FeUrea (32.2) indicating pre-renal azotemia. Due to uptrending Cr, most likely ATN component (BUN/Cr: 10.1, hypotension on admission, home ACEi, diuretics, NSAID) vs AIN from pyelonephritis (1 functioning kidney). No fever, rash, or peripheral eosinophilia to suggest AIN. Glomerulonephritis or nephrotic syndrome also possible considering hypoalbuminemia, however no casts, hypertension, or significant proteinuria or edema.  Bladder scan on 11/26 with minimal PRV (0-22) so post-renal etiology unlikely, however pt with BPH that is medically managed. Pt on diuretic therapy for chronic LE edema, etiology unknown. Pt received  IVF 50/h x 12 hr on 11/25 and 53mL/hr x 10 hr on 11/26 without improvement of renal function.  No recent contrast. Reports using  NSAIDs (naproxen) recently at home.  -Strict I & O's--> Uoutput 1.35L  -Monitor Weight --> 210 lb to 219 lb since admission -Obtain bladder scan (11/26) w/ PRV--> WNL -Monitor Cr--> 4.73 on admission (11/25) to 5.60  -Hold home Lasix 40 mg daily - consider trial with improving renal function -Avoid nephrotoxins - hold home ACEi, avoid NSAIDs, ARB, contrast, hold protonix -Consider renal US if continues to worsen -Monitor bicarbonate, acidosis resolved --> 22 on admission to 18 to 20 w/ PO bicarbonate 650 mg TID -Appreciate nephrology consult---most likely approaching plateau phase of ATN, no indication for HD at this time -Nursing order to ambulate patient  Gram negative rod bacteremia - Bottle x2 from 11/25 with GNR. Most likely due to E. Coli from pyelonephritis.  -Continue Impinemem Day 4 -Repeat blood cultures x 2 if fever or leukocytosis  Pyelonephritis -  Afebrile with improving leukocytosis. Pt with left sided flank pain and CVA tenderness with leukocytosis (20.9K) and UA on 11/25 with pyuria and hematuria and stranding about the left kidney concerning for pyelonephritis. Most likely due to obstructive left renal stone present on previous imaging that passed and caused inflammation and infection.    -Blood cultures x2 from 11/25 --> GNR  -Urine culture >100K E. Coli , pansensitive (except to ampicillin)  -Repeat UA 11/27 still with hematuria and pyuria, proteinuria  improved  -Continue Imipenem Day 4 -due to multiple allergies, per ID no other good options--> will likely need PICC line -Continue to monitor CBC--> downtrending (20.9K on admission to 11.5K)  Hypoalbuminemia - Pt with albumin of 3.2 on admission(11/25) that is downtrending to 2.6 on 11/28. Nephrotic syndrome possible considering renal failure and lower extremity edema, however no significant  proteinuria (improved from admission). Liver function normal. Chronic malnutrition also possible. -Continue to monitor -Heart healthy diet -Nutrition consult -Obtain pre-albumin  History of fluid overload - currently with fluid overload--last Echo 07/31/13 showed EF of 55% with LVH and Moderate MVR. Per records he has history of compensated heart failure. Patient endorses baseline dyspnea with exertion, denies  orthopnea (two pillows) or PND. Per chart review, his Lasix was increased in July 2014 to facilitate diuresis of 34 lbs, and his lasix was decreased to 40 daily in Oct. 2014. His weight is up 11 lbs on admission. Pt received gentile IV hydration in setting of renal failure.   -Monitor daily weight --> 210 lb to 219 lb since admission -Consider restarting lasix 40 mg daily --> wt gain since admission    A Fib with Asymptomatic PVCs - Currently in NSR. Patient was noted to have a new onset atrial fibrillation with a slow HR in July 2014. His cardiologist decreased the Metoprolol from 50 to 25 mg po daily. He is on Xarelto 15 Qday for anticoagulation.  - Monitor on telemetry   - Troponin negative x 1  - Hold Xarelto while GFR < 15--consider resume Xarelto when GFR > 15.  - Continue ASA while Xarelto is on hold   Acute on chronic thrombocytopenia - stable. Baseline 106-160. Last PLT was 160K in Oct. 2014. PLT 105K on admission.  - PT/APTT --> INR 2.38 -Continue to monitor CBC  Hypertension - currently normotensive. BP range 96/39 - 147/49.  -Continue metoprolol succinate 24 mg daily  -Hold amlodipine-benazepril 5-20 mg daily in setting of AKI   Hyperlipidemia - Last lipid panel on 06/11/13 within normal limits. At home on simvastatin.  -Continue home Simvastatin 20 mg daily   BPH (benign prostatic hyperplasia)--stable, no indication of post urinary obstruction. Follows with urologist in [place].   -Continue home doxazosin 8 mg daily  -Continue home finasteride 5 mg daily   Iron  Deficicency Anemia -  stable at baseline. Last anemia panel on 04/19/13 within normal limits. Ferritin on 10/21 within normal limits.  Most likely due to malabsorption in setting of colon resection. - Hold iron supplement in the acute setting of infection - Resume upon discharge  -Obtain anemia panel  Personal history of malignant neoplasm of rectum, rectosigmoid junction, and anus  Stable, managed by his oncologist Dr. Truett Perna.  - No indication of metastatic versus compression lesions on abdomen CT scan on admission   GERD - No reports of acid reflux symptoms -Hold home protonix 40 mg daily in setting of AKI   Diet: Heart DVT PPx: SCDs (Hold Heparin in the setting of thrombocytopenia. INR 2.38 ) Code: Full     Dispo: Disposition is deferred at this time, awaiting improvement of current medical problems.  Anticipated discharge in approximately 1 day(s).   The patient does not have a current PCP Andrey Campanile Elizebeth Koller, MD) and does need an [facility] hospital follow-up appointment after discharge.  The patient does not have transportation limitations that hinder transportation to clinic appointments.  .Services Needed at time of discharge: Y = Yes, Blank = No PT:   OT:  RN:   Equipment:   Other:     LOS: 3 days   Otis Brace, MD 11/23/2013, 9:04 AM

## 2013-11-23 NOTE — Progress Notes (Signed)
ANTIBIOTIC CONSULT NOTE - FOLLOW UP  Pharmacy Consult for Imipenem Indication: E. Coli pyelonephritis, Gram-negative bacteremia  Allergies  Allergen Reactions  . Cephalexin   . Ciprofloxacin Other (See Comments)    Reaction unknown  . Hctz [Hydrochlorothiazide]     DIZZINESS   . Levofloxacin   . Lipitor [Atorvastatin Calcium]     MUSCLE PAIN  . Niaspan [Niacin Er]     FLUSHING   . Oxycodone-Acetaminophen Other (See Comments)    Reaction unknown  . Sulfonamide Derivatives Other (See Comments)    Reaction unkonwn    Patient Measurements: Height: 5\' 10"  (177.8 cm) Weight: 219 lb 2.2 oz (99.4 kg) IBW/kg (Calculated) : 73  Vital Signs: Temp: 98 F (36.7 C) (11/28 0629) Temp src: Oral (11/28 0629) BP: 117/55 mmHg (11/28 0629) Pulse Rate: 103 (11/28 0629) Intake/Output from previous day: 11/27 0701 - 11/28 0700 In: 483 [P.O.:480; I.V.:3] Out: 1350 [Urine:1350] Intake/Output from this shift: Total I/O In: 240 [P.O.:240] Out: -   Labs:  Recent Labs  11/21/13 0348  11/21/13 2239 11/22/13 0500 11/23/13 0557  WBC 17.4*  --   --  13.8* 11.5*  HGB 9.5*  --   --  9.0* 9.2*  PLT 86*  --   --  81* 93*  CREATININE 5.15*  < > 5.32* 5.39* 5.60*  < > = values in this interval not displayed. Estimated Creatinine Clearance: 13.1 ml/min (by C-G formula based on Cr of 5.6). No results found for this basename: VANCOTROUGH, Leodis Binet, VANCORANDOM, GENTTROUGH, GENTPEAK, GENTRANDOM, TOBRATROUGH, TOBRAPEAK, TOBRARND, AMIKACINPEAK, AMIKACINTROU, AMIKACIN,  in the last 72 hours   Microbiology: Recent Results (from the past 720 hour(s))  URINE CULTURE     Status: None   Collection Time    11/20/13  6:30 AM      Result Value Range Status   Specimen Description URINE, CLEAN CATCH   Final   Special Requests NONE   Final   Culture  Setup Time     Final   Value: 11/20/2013 07:04     Performed at [facility] Count     Final   Value: >=100,000 COLONIES/ML      Performed at [facility]   Culture     Final   Value: ESCHERICHIA COLI     Performed at [facility]   Report Status 11/22/2013 FINAL   Final   Organism ID, Bacteria ESCHERICHIA COLI   Final  CULTURE, BLOOD (ROUTINE X 2)     Status: None   Collection Time    11/20/13 11:15 AM      Result Value Range Status   Specimen Description BLOOD RIGHT ANTECUBITAL   Final   Special Requests BOTTLES DRAWN AEROBIC ONLY 5CC   Final   Culture  Setup Time     Final   Value: 11/20/2013 17:00     Performed at [facility]   Culture     Final   Value: GRAM NEGATIVE RODS     1610 Note: Gram Stain Report Called to,Read Back By and Verified With: Gerrit Halls 11/20/2013 FULKC     Performed at [facility]   Report Status PENDING   Incomplete  CULTURE, BLOOD (ROUTINE X 2)     Status: None   Collection Time    11/20/13 11:20 AM      Result Value Range Status   Specimen Description BLOOD RIGHT ARM   Final   Special Requests BOTTLES DRAWN AEROBIC ONLY 8CC  Final   Culture  Setup Time     Final   Value: 11/20/2013 16:59     Performed at [facility]   Culture     Final   Value: GRAM NEGATIVE RODS     Note: Gram Stain Report Called to,Read Back By and Verified With: BRITTANY B @ [phone] 11/23/13 BY KRAWS     Performed at [facility]   Report Status PENDING   Incomplete    Anti-infectives   Start     Dose/Rate Route Frequency Ordered Stop   11/20/13 2200  imipenem-cilastatin (PRIMAXIN) 250 mg in sodium chloride 0.9 % 100 mL IVPB     250 mg 200 mL/hr over 30 Minutes Intravenous Every 12 hours 11/20/13 1420     11/20/13 0930  imipenem-cilastatin (PRIMAXIN) 500 mg in sodium chloride 0.9 % 100 mL IVPB  Status:  Discontinued     500 mg 200 mL/hr over 30 Minutes Intravenous 3 times per day 11/20/13 0923 11/20/13 1420   11/20/13 0745  amoxicillin (AMOXIL) capsule 500 mg  Status:  Discontinued     500 mg Oral 3 times per day 11/20/13 6962 11/20/13 9528       Assessment: 79 year old male on Day #4 of Imipenem for E. coli pyelonephritis with Gram-negative bacteremia.  He appears to be clinically responding.  His serum creatinine remains elevated and his Imipenem dose is adjusted appropriately.  Plan:  Continue Imipenem 250mg  IV q12h Await speciation of blood cultures Monitor renal function and adjust as needed  Estella Husk, Pharm.D., BCPS, AAHIVP Clinical Pharmacist Phone: [phone] or [phone] 11/23/2013, 12:22 PM

## 2013-11-23 NOTE — Progress Notes (Signed)
Patient ID: Nicholas [name], male   DOB: 1934/10/30, 79 y.o.   MRN: [id number]  [facility] Progress Note    Assessment/ Plan:   1. Acute renal failure chronic kidney disease stage IV: Likely from ATN (hypotension, recent nonsteroidal anti-inflammatory drug use in the face of ongoing ACE inhibitor/diuretic) versus AIN from pyelonephritis to what appears to be a solitary functioning kidney. Currently, Non-oliguric but was slightly worsened kidney function. No acute dialysis needs noted at this time-has chronic stable hypervolemia. Metabolic acidosis corrected with oral sodium bicarbonate. He may be approaching the plateau phase of ATN-Continue to monitor closely anticipating some renal recovery. 2. Pyelonephritis: Escherichia coli urinary tract infection however patient allergic or intolerant of several antibiotics-currently receiving Primaxin.  3. Metabolic acidosis: Most likely secondary to acute renal failure plus/minus functional acidosis with chronic kidney disease. Improved with oral sodium bicarbonate.  4. Anemia: Possibly anemia from chronic kidney disease-check iron stores and undertake replacement after infection controlled. No indications for PRBCs at this time.   Subjective:   Reports to be feeling fair-wants to get up and try and ambulate around his room with assistance.    Objective:   BP 117/55  Pulse 103  Temp(Src) 98 F (36.7 C) (Oral)  Resp 18  Ht 5\' 10"  (1.778 m)  Wt 99.4 kg (219 lb 2.2 oz)  BMI 31.44 kg/m2  SpO2 97%  Intake/Output Summary (Last 24 hours) at 11/23/13 0830 Last data filed at 11/23/13 4742  Gross per 24 hour  Intake    243 ml  Output   1250 ml  Net  -1007 ml   Weight change: 1.2 kg (2 lb 10.3 oz)  Physical Exam: Gen: Comfortably sitting up in his recliner CVS: Pulse regular in rate and rhythm, S1 and S2 normal Resp: Decreased breath sounds over the bases otherwise clear to auscultation Abd: Soft, obese, left lower quadrant  colostomy Ext: 2-3+ edema bilateral lower extremities  Imaging: No results found.  Labs: BMET  Recent Labs Lab 11/20/13 0804 11/20/13 1355 11/21/13 0348 11/21/13 2016 11/21/13 2239 11/22/13 0500 11/23/13 0557  NA 134*  --  130* 133* 131* 132* 136  K 4.7  --  4.5 5.3* 4.5 4.4 4.6  CL 101  --  99 102 100 100 104  CO2 22  --  18* 18* 18* 16* 20  GLUCOSE 90  --  82 99 96 92 97  BUN 48*  --  57* 64* 65* 69* 79*  CREATININE 4.73*  --  5.15* 5.28* 5.32* 5.39* 5.60*  CALCIUM 9.5  --  9.2 9.2 9.3 9.1 9.4  PHOS  --  3.0  --   --   --   --  4.3   CBC  Recent Labs Lab 11/20/13 0804 11/21/13 0348 11/22/13 0500 11/23/13 0557  WBC 20.9* 17.4* 13.8* 11.5*  NEUTROABS 18.9*  --  12.3* 9.7*  HGB 9.4* 9.5* 9.0* 9.2*  HCT 28.2* 28.6* 26.6* 27.1*  MCV 92.8 92.3 90.5 89.1  PLT 105* 86* 81* 93*    Medications:    . amLODipine  5 mg Oral Daily  . aspirin EC  81 mg Oral Daily  . doxazosin  8 mg Oral QHS  . finasteride  5 mg Oral Daily  . imipenem-cilastatin  250 mg Intravenous Q12H  . metoprolol succinate  25 mg Oral Daily  . simvastatin  20 mg Oral q1800  . sodium bicarbonate  650 mg Oral TID  . sodium chloride  3 mL Intravenous Q12H  Zetta Bills, MD 11/23/2013, 8:30 AM

## 2013-11-23 NOTE — Progress Notes (Signed)
INITIAL NUTRITION ASSESSMENT  DOCUMENTATION CODES Per approved criteria  -Obesity Unspecified   INTERVENTION: No nutrition intervention at this time --- patient declined RD to follow for nutrition care plan  NUTRITION DIAGNOSIS: Altered nutrition related laboratory value related to acute stress response as evidenced by albumin 2.6 g/dL  Goal: Pt to meet >/= 16% of their estimated nutrition needs   Monitor:  PO intake, weight, labs, I/O's  Reason for Assessment: Consult  79 y.o. male  Admitting Dx: Pyelonephritis, acute  ASSESSMENT: Patient with PMH of colon cancer, HTN, CHF, CAD; presented with sudden onset left flank pain with hematuria; admitted for further evaluation.   Patient reports his appetite is OK; PO intake variable at 50-100% per flowsheet records; per weight readings, patient's weight fluctuates more than likely given fluid; declined addition of nutrition supplements at this time.  Albumin has a half-life of 21 days and is strongly affected by stress response and inflammatory process, therefore, do not expect to see an improvement in this lab value during acute hospitalization.  Height: Ht Readings from Last 1 Encounters:  11/20/13 5\' 10"  (1.778 m)    Weight: Wt Readings from Last 1 Encounters:  11/23/13 219 lb 2.2 oz (99.4 kg)    Ideal Body Weight: 166 lb  % Ideal Body Weight: 132%  Wt Readings from Last 10 Encounters:  11/23/13 219 lb 2.2 oz (99.4 kg)  10/19/13 199 lb 14.4 oz (90.674 kg)  10/17/13 201 lb (91.173 kg)  10/16/13 202 lb 12.8 oz (91.989 kg)  08/09/13 229 lb (103.874 kg)  08/06/13 229 lb 6.4 oz (104.055 kg)  07/16/13 235 lb 1.9 oz (106.65 kg)  07/10/13 230 lb 12.8 oz (104.69 kg)  06/11/13 222 lb 12.8 oz (101.061 kg)  04/08/13 224 lb 8 oz (101.833 kg)    Usual Body Weight: 229 lb  % Usual Body Weight: 96%  BMI:  Body mass index is 31.44 kg/(m^2).  Estimated Nutritional Needs: Kcal: 2000-2200 Protein: 100-110 gm Fluid: per  MD  Skin: Intact  Diet Order: Cardiac  EDUCATION NEEDS: -No education needs identified at this time   Intake/Output Summary (Last 24 hours) at 11/23/13 1131 Last data filed at 11/23/13 0834  Gross per 24 hour  Intake    483 ml  Output   1050 ml  Net   -567 ml    Labs:   Recent Labs Lab 11/20/13 0804 11/20/13 1355  11/21/13 2239 11/22/13 0500 11/23/13 0557  NA 134*  --   < > 131* 132* 136  K 4.7  --   < > 4.5 4.4 4.6  CL 101  --   < > 100 100 104  CO2 22  --   < > 18* 16* 20  BUN 48*  --   < > 65* 69* 79*  CREATININE 4.73*  --   < > 5.32* 5.39* 5.60*  CALCIUM 9.5  --   < > 9.3 9.1 9.4  MG  --  2.0  --   --   --  2.4  PHOS  --  3.0  --   --   --  4.3  GLUCOSE 90  --   < > 96 92 97  < > = values in this interval not displayed.  CBG (last 3)   Recent Labs  11/22/13 2136 11/23/13 0628 11/23/13 1114  GLUCAP 105* 94 100*    Scheduled Meds: . amLODipine  5 mg Oral Daily  . aspirin EC  81 mg Oral Daily  .  doxazosin  8 mg Oral QHS  . finasteride  5 mg Oral Daily  . imipenem-cilastatin  250 mg Intravenous Q12H  . metoprolol succinate  25 mg Oral Daily  . simvastatin  20 mg Oral q1800  . sodium bicarbonate  650 mg Oral TID  . sodium chloride  3 mL Intravenous Q12H    Continuous Infusions:   Past Medical History  Diagnosis Date  . 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 Pure hypercholesterolemia     takes Vytorin daily  . Personal history of colonic polyps     adenomatous  . Diverticulosis of colon (without mention of hemorrhage)   . Cardiovascular disease   . IHD (ischemic heart disease)   . History of colon cancer   . Colon cancer   . Hypertension     takes Amlodipine,Metoprolol,and Cardura daily  . Coronary artery disease   . Acute myocardial infarction, unspecified site, episode of care unspecified   . MI (myocardial infarction) 1993  . CHF (congestive heart failure)     takes Lasix daily  . H/O blood clots 1993  . Dysrhythmia   . Shortness  of breath     with exertion  . Arthritis   . Joint pain   . Joint swelling   . Edema     to right leg-below knee;not any different than when he saw brackbill in jan 2013  . Bruises easily     takes ASA daily  . Skin cancer     nose  . GERD (gastroesophageal reflux disease)     takes Protonix daily  . Diarrhea   . Constipation   . Enlarged prostate     takes Finasteride daily  . Blood transfusion 2008  . Insomnia   . Hx of CABG     1993  . Ejection fraction     EF 51%, nuclear, 2010    Past Surgical History  Procedure Laterality Date  . Shoulder surgery  2007    left  . Cataract extraction      right  . Abdominoperineal proctocolectomy  06/01/2007  . Coronary angioplasty  05/22/92  . Colostomy  2008  . Colonoscopy    . Coronary artery bypass graft  1993  . Colon surgery  2008    colectomy  . Knee arthroscopy  2010    right   . Total knee arthroplasty  02/28/2012    Procedure: TOTAL KNEE ARTHROPLASTY;  Surgeon: Raymon Mutton, MD;  Location: [facility] OR;  Service: Orthopedics;  Laterality: Right;    Maureen Chatters, RD, LDN Pager #: [phone] After-Hours Pager #: [phone]

## 2013-11-23 NOTE — Progress Notes (Signed)
Ambulated about 500 ft with pt with a walker.  Pt is very unsteady and had a great deal of difficult getting out of his chair even with assistance.  He got short of breath a few times while walking and had to stop.

## 2013-11-24 DIAGNOSIS — Z85048 Personal history of other malignant neoplasm of rectum, rectosigmoid junction, and anus: Secondary | ICD-10-CM

## 2013-11-24 DIAGNOSIS — D509 Iron deficiency anemia, unspecified: Secondary | ICD-10-CM

## 2013-11-24 DIAGNOSIS — N179 Acute kidney failure, unspecified: Secondary | ICD-10-CM

## 2013-11-24 DIAGNOSIS — D696 Thrombocytopenia, unspecified: Secondary | ICD-10-CM

## 2013-11-24 DIAGNOSIS — N400 Enlarged prostate without lower urinary tract symptoms: Secondary | ICD-10-CM

## 2013-11-24 DIAGNOSIS — E8779 Other fluid overload: Secondary | ICD-10-CM

## 2013-11-24 LAB — GLUCOSE, CAPILLARY
Glucose-Capillary: 84 mg/dL (ref 70–99)
Glucose-Capillary: 83 mg/dL (ref 70–99)

## 2013-11-24 LAB — IRON AND TIBC
Iron: 26 ug/dL — ABNORMAL LOW (ref 42–135)
Saturation Ratios: 13 % — ABNORMAL LOW (ref 20–55)
TIBC: 204 ug/dL — ABNORMAL LOW (ref 215–435)
UIBC: 178 ug/dL (ref 125–400)

## 2013-11-24 LAB — COMPREHENSIVE METABOLIC PANEL
ALT: 15 U/L (ref 0–53)
AST: 21 U/L (ref 0–37)
Albumin: 2.5 g/dL — ABNORMAL LOW (ref 3.5–5.2)
Alkaline Phosphatase: 81 U/L (ref 39–117)
BUN: 81 mg/dL — ABNORMAL HIGH (ref 6–23)
CO2: 19 mEq/L (ref 19–32)
Calcium: 9.1 mg/dL (ref 8.4–10.5)
Chloride: 106 mEq/L (ref 96–112)
GFR calc Af Amer: 10 mL/min — ABNORMAL LOW (ref >90)
GFR calc non Af Amer: 9 mL/min — ABNORMAL LOW (ref >90)
Glucose, Bld: 92 mg/dL (ref 70–99)
Potassium: 4.5 mEq/L (ref 3.5–5.1)
Total Bilirubin: 1 mg/dL (ref 0.3–1.2)
Total Protein: 5.8 g/dL — ABNORMAL LOW (ref 6.0–8.3)

## 2013-11-24 LAB — CBC WITH DIFFERENTIAL/PLATELET
Basophils Relative: 0 % (ref 0–1)
Eosinophils Absolute: 0.2 10*3/uL (ref 0.0–0.7)
HCT: 26.8 % — ABNORMAL LOW (ref 39.0–52.0)
Hemoglobin: 9.3 g/dL — ABNORMAL LOW (ref 13.0–17.0)
Lymphocytes Relative: 8 % — ABNORMAL LOW (ref 12–46)
Lymphs Abs: 0.6 10*3/uL — ABNORMAL LOW (ref 0.7–4.0)
MCH: 30.9 pg (ref 26.0–34.0)
MCHC: 34.7 g/dL (ref 30.0–36.0)
MCV: 89 fL (ref 78.0–100.0)
Monocytes Relative: 10 % (ref 3–12)
Neutrophils Relative %: 79 % — ABNORMAL HIGH (ref 43–77)
RBC: 3.01 MIL/uL — ABNORMAL LOW (ref 4.22–5.81)

## 2013-11-24 MED ORDER — LEVOFLOXACIN 500 MG PO TABS
500.0000 mg | ORAL_TABLET | Freq: Every day | ORAL | Status: AC
  Administered 2013-11-24: 500 mg via ORAL
  Filled 2013-11-24: qty 1

## 2013-11-24 MED ORDER — DIPHENHYDRAMINE HCL 50 MG/ML IJ SOLN: 25.0000 mg | INTRAMUSCULAR | Status: DC | PRN

## 2013-11-24 MED ORDER — SODIUM CHLORIDE 0.9 % IV SOLN
250.0000 mg | Freq: Two times a day (BID) | INTRAVENOUS | Status: DC
  Administered 2013-11-24 – 2013-11-25 (×2): 250 mg via INTRAVENOUS
  Filled 2013-11-24 (×3): qty 250

## 2013-11-24 MED ORDER — SODIUM CHLORIDE 0.9 % IV SOLN: 500.0000 mg | Freq: Three times a day (TID) | INTRAVENOUS | Status: DC

## 2013-11-24 MED ORDER — LEVOFLOXACIN 250 MG PO TABS: 250.0000 mg | ORAL_TABLET | ORAL | Status: DC

## 2013-11-24 NOTE — Progress Notes (Signed)
Occupational Therapy Evaluation Patient Details Name: DONAVYN FECHER MRN: [id number] DOB: 1934/07/21 Today's Date: 11/24/2013 Time: 1543-1600 OT Time Calculation (min): 17 min  OT Assessment / Plan / Recommendation History of present illness Patient is a 79 yo male admitted with pyelonephritis, acute on chronic kidney disease, anemia.  Patient with h/o colon CA with colostomy, MI, CHF, HTN.   Clinical Impression   PTA, pt independent with ADL and mobility. Pt has difficulty with LB ADL due to colostomy bag. Will see tomorrow to educate on compensatory techniques and AE.     OT Assessment  Patient needs continued OT Services    Follow Up Recommendations  No OT follow up    Barriers to Discharge      Equipment Recommendations  None recommended by OT    Recommendations for Other Services    Frequency  Min 2X/week    Precautions / Restrictions Precautions Precautions: Fall Restrictions Weight Bearing Restrictions: No   Pertinent Vitals/Pain no apparent distress     ADL  Lower Body Bathing: Minimal assistance Where Assessed - Lower Body Bathing: Unsupported sit to stand Lower Body Dressing: Minimal assistance Where Assessed - Lower Body Dressing: Unsupported sit to stand Toilet Transfer: Supervision/safety Transfers/Ambulation Related to ADLs: S ADL Comments: would benefit from AE for LB ADL, states he has difficulty with socks and LB dressing    OT Diagnosis: Generalized weakness  OT Problem List: Decreased knowledge of use of DME or AE OT Treatment Interventions: Self-care/ADL training;DME and/or AE instruction;Energy conservation;Patient/family education   OT Goals(Current goals can be found in the care plan section) Acute Rehab OT Goals Patient Stated Goal: To go home and be able to work in yard OT Goal Formulation: With patient Time For Goal Achievement: 12/01/13 Potential to Achieve Goals: Good  Visit Information  Last OT Received On: 11/24/13 Assistance  Needed: +1 History of Present Illness: Patient is a 79 yo male admitted with pyelonephritis, acute on chronic kidney disease, anemia.  Patient with h/o colon CA with colostomy, MI, CHF, HTN.       Prior Functioning     Home Living Family/patient expects to be discharged to:: Private residence Living Arrangements: Spouse/significant other Available Help at Discharge: Family;Available 24 hours/day Type of Home: House Home Access: Stairs to enter [employer] of Steps: 4 Entrance Stairs-Rails: Right;Left Home Layout: One level Home Equipment: Walker - 2 wheels;Cane - single point Prior Function Level of Independence: Independent Comments: Working in yard week before admission Communication Communication: No difficulties         Vision/Perception     Copywriter, advertising Arousal/Alertness: Awake/alert Behavior During Therapy: WFL for tasks assessed/performed (Agitated when wife states he has been having trouble walking) Overall Cognitive Status: Within Functional Limits for tasks assessed    Extremity/Trunk Assessment Upper Extremity Assessment Upper Extremity Assessment: Overall WFL for tasks assessed Lower Extremity Assessment Lower Extremity Assessment: Generalized weakness (BLE edema) Cervical / Trunk Assessment Cervical / Trunk Assessment: Kyphotic     Mobility Bed Mobility Bed Mobility: Not assessed Transfers Sit to Stand: With upper extremity assist;With armrests;From chair/3-in-1;5: Supervision Stand to Sit: With upper extremity assist;With armrests;To chair/3-in-1;5: Supervision Details for Transfer Assistance: Verbal cues for hand placement and to scoot to edge of chair before standing.  Encouraged patient to move head/trunk forward over feet - initially tried to stand extending trunk backward.  Practiced x2.     Exercise     Balance Balance Balance Assessed:  (WFL for ADL)   End of Session  OT - End of Session Activity Tolerance: Patient  tolerated treatment well Patient left: in chair;with call bell/phone within reach Nurse Communication: Mobility status  GO     Parul Porcelli,HILLARY 11/24/2013, 5:07 PM [facility], OTR/L  [phone] 11/24/2013

## 2013-11-24 NOTE — Progress Notes (Addendum)
Subjective:  Pt seen and examined. No acute events overnight. Reports he is doing well and would like to go home. He denies fever, chills, flank pain, nausea, vomiting, abdominal pain, or change in BM. Reports his urination is increasing with no hematuria. Reports LE swelling is the same. No headache, lightheadedness, palpitations, dyspnea, or CP.  Reports he ambulated without difficulty with nurse yesterday.       Objective: Vital signs in last 24 hours: Filed Vitals:   11/23/13 0500 11/23/13 0629 11/23/13 2158 11/24/13 0448  BP:  117/55 128/56 141/49  Pulse:  103 90 56  Temp:  98 F (36.7 C) 97.9 F (36.6 C) 97.7 F (36.5 C)  TempSrc:  Oral Oral Oral  Resp:  18 19 19   Height:      Weight: 99.4 kg (219 lb 2.2 oz)   100.8 kg (222 lb 3.6 oz)  SpO2:  97% 97% 97%   Weight change: 1.4 kg (3 lb 1.4 oz)  Intake/Output Summary (Last 24 hours) at 11/24/13 0720 Last data filed at 11/24/13 0500  Gross per 24 hour  Intake    580 ml  Output   1275 ml  Net   -695 ml   General: NAD Head: normocephalic and atraumatic.  Eyes: vision grossly intact, pupils equal, pupils round, pupils reactive to light, no injection and anicteric.  Mouth: pharynx pink and moist, no erythema, and no exudates.  Neck: supple, full ROM, no thyromegaly, no JVD, and no carotid bruits.  Lungs: normal respiratory effort, no accessory muscle use, normal breath sounds, no crackles, and no wheezes. Heart: irregularly irregular rhythm, no murmur, no gallop, and no rub.  Abdomen: soft, rounded, LLQ colostomy bag, non-tender, normal bowel sounds, no distention, no guarding, no rebound tenderness, no hepatomegaly, and no splenomegaly. No CVA tenderness  Msk: no joint swelling, no joint warmth, and no redness over joints.  Extremities:  B/L +3 LE pitting edema   Neurologic: alert & oriented X3, cranial nerves II-XII intact, strength normal in all extremities, sensation intact to light touch, and gait normal.  Skin:  turgor normal and no rashes. Multiple lesions on face Psych: Oriented X3, memory intact for recent and remote, normally interactive, good eye contact, not anxious appearing, and not depressed appearing.    Lab Results: Basic Metabolic Panel:  Recent Labs Lab 11/20/13 1355  11/22/13 0500 11/23/13 0557  NA  --   < > 132* 136  K  --   < > 4.4 4.6  CL  --   < > 100 104  CO2  --   < > 16* 20  GLUCOSE  --   < > 92 97  BUN  --   < > 69* 79*  CREATININE  --   < > 5.39* 5.60*  CALCIUM  --   < > 9.1 9.4  MG 2.0  --   --  2.4  PHOS 3.0  --   --  4.3  < > = values in this interval not displayed. Liver Function Tests:  Recent Labs Lab 11/20/13 1355 11/23/13 0557  AST 19  --   ALT 14  --   ALKPHOS 64  --   BILITOT 1.2  --   PROT 6.2  --   ALBUMIN 3.2* 2.6*    Recent Labs Lab 11/20/13 1500  LIPASE 14   No results found for this basename: AMMONIA,  in the last 168 hours CBC:  Recent Labs Lab 11/22/13 0500 11/23/13 0557  WBC 13.8* 11.5*  NEUTROABS 12.3* 9.7*  HGB 9.0* 9.2*  HCT 26.6* 27.1*  MCV 90.5 89.1  PLT 81* 93*   Cardiac Enzymes:  Recent Labs Lab 11/20/13 1150 11/20/13 1825 11/20/13 2330  TROPONINI <0.30 <0.30 <0.30   BNP: No results found for this basename: PROBNP,  in the last 168 hours D-Dimer: No results found for this basename: DDIMER,  in the last 168 hours CBG:  Recent Labs Lab 11/22/13 2136 11/23/13 0628 11/23/13 1114 11/23/13 1613 11/23/13 2123 11/24/13 0602  GLUCAP 105* 94 100* 119* 104* 87   Hemoglobin A1C: No results found for this basename: HGBA1C,  in the last 168 hours Fasting Lipid Panel: No results found for this basename: CHOL, HDL, LDLCALC, TRIG, CHOLHDL, LDLDIRECT,  in the last 168 hours Thyroid Function Tests:  Recent Labs Lab 11/20/13 1355  TSH 1.457   Coagulation:  Recent Labs Lab 11/20/13 1355 11/21/13 0348  LABPROT 28.6* 25.2*  INR 2.81* 2.38*   Anemia Panel:  Recent Labs Lab 11/23/13 0557    VITAMINB12 436  FOLATE >20.0  FERRITIN 260  TIBC 204*  IRON 26*  RETICCTPCT 0.8   Urine Drug Screen: Drugs of Abuse  No results found for this basename: labopia,  cocainscrnur,  labbenz,  amphetmu,  thcu,  labbarb    Alcohol Level: No results found for this basename: ETH,  in the last 168 hours Urinalysis:  Recent Labs Lab 11/20/13 0630 11/22/13 1504  COLORURINE RED* YELLOW  LABSPEC 1.021 1.007  PHURINE 5.5 5.5  GLUCOSEU NEGATIVE NEGATIVE  HGBUR LARGE* LARGE*  BILIRUBINUR MODERATE* NEGATIVE  KETONESUR 15* NEGATIVE  PROTEINUR >300* 30*  UROBILINOGEN 1.0 0.2  NITRITE POSITIVE* NEGATIVE  LEUKOCYTESUR LARGE* LARGE*   Micro Results: Recent Results (from the past 240 hour(s))  URINE CULTURE     Status: None   Collection Time    11/20/13  6:30 AM      Result Value Range Status   Specimen Description URINE, CLEAN CATCH   Final   Special Requests NONE   Final   Culture  Setup Time     Final   Value: 11/20/2013 07:04     Performed at [facility] Count     Final   Value: >=100,000 COLONIES/ML     Performed at [facility]   Culture     Final   Value: ESCHERICHIA COLI     Performed at [facility]   Report Status 11/22/2013 FINAL   Final   Organism ID, Bacteria ESCHERICHIA COLI   Final  CULTURE, BLOOD (ROUTINE X 2)     Status: None   Collection Time    11/20/13 11:15 AM      Result Value Range Status   Specimen Description BLOOD RIGHT ANTECUBITAL   Final   Special Requests BOTTLES DRAWN AEROBIC ONLY 5CC   Final   Culture  Setup Time     Final   Value: 11/20/2013 17:00     Performed at [facility]   Culture     Final   Value: GRAM NEGATIVE RODS     1610 Note: Gram Stain Report Called to,Read Back By and Verified With: Gerrit Halls 11/20/2013 [facility]     Performed at [facility]   Report Status PENDING   Incomplete  CULTURE, BLOOD (ROUTINE X 2)     Status: None   Collection Time    11/20/13 11:20 AM      Result  Value Range  Status   Specimen Description BLOOD RIGHT ARM   Final   Special Requests BOTTLES DRAWN AEROBIC ONLY 8CC   Final   Culture  Setup Time     Final   Value: 11/20/2013 16:59     Performed at [facility]   Culture     Final   Value: GRAM NEGATIVE RODS     Note: Gram Stain Report Called to,Read Back By and Verified With: BRITTANY B @ [phone] 11/23/13 BY KRAWS     Performed at [facility]   Report Status PENDING   Incomplete   Studies/Results: No results found. Medications: I have reviewed the patient's current medications. Scheduled Meds: . amLODipine  5 mg Oral Daily  . aspirin EC  81 mg Oral Daily  . doxazosin  8 mg Oral QHS  . finasteride  5 mg Oral Daily  . imipenem-cilastatin  250 mg Intravenous Q12H  . metoprolol succinate  25 mg Oral Daily  . simvastatin  20 mg Oral q1800  . sodium bicarbonate  650 mg Oral TID  . sodium chloride  3 mL Intravenous Q12H   Continuous Infusions:   PRN Meds:.sodium chloride, sodium chloride Assessment/Plan: Principal Problem:   Pyelonephritis, acute Active Problems:   HYPERCHOLESTEROLEMIA   ANEMIA, IRON DEFICIENCY   HYPERTENSION   Personal history of malignant neoplasm of rectum, rectosigmoid junction, and anus   Asymptomatic PVCs   BPH (benign prostatic hyperplasia)   Acute on chronic kidney failure   Pyelonephritis    AKI on CKD   - nonoliguric, Cr beginning to downtrend, acidosis resolved. Baseline Cr 1.6-2.1.  Etiology of CKD unknown. FeUrea (32.2) indicating pre-renal azotemia. Due to uptrending Cr, most likely ATN component (BUN/Cr: 10.1, hypotension on admission, home ACEi, diuretics, NSAID) vs AIN from pyelonephritis (1 functioning kidney). No fever, rash, or peripheral eosinophilia to suggest AIN. Glomerulonephritis or nephrotic syndrome also possible considering hypoalbuminemia, however no casts, hypertension, or significant proteinuria or edema.  Bladder scan on 11/26 with minimal PRV (0-22) so post-renal  etiology unlikely, however pt with BPH that is medically managed. Pt on diuretic therapy for chronic LE edema, etiology unknown. Pt received IVF 50/h x 12 hr on 11/25 and 46mL/hr x 10 hr on 11/26 without improvement of renal function.  No recent contrast. Reports using  NSAIDs (naproxen) recently at home.  -Strict I & O's--> Uoutput 1.275L yesterday  -Monitor Weight --> 210 lb to 222 lb since admission -Obtain bladder scan (11/26) w/ PRV--> WNL -Monitor Cr--> 4.73 on admission (11/25) to 5.60 yesteday to 5.52 today -Hold home Lasix 40 mg daily - per nephro consider restarting tomm if renal function continues to improve -Avoid nephrotoxins - hold home ACEi, avoid NSAIDs, ARB, contrast, hold protonix -Consider renal US if continues to worsen -Monitor bicarbonate, acidosis improved with  PO bicarbonate 650 mg TID -Appreciate nephrology consult---most likely plateau phase of ATN, no indication for HD at this time -PT/OT consult  Gram negative rod bacteremia - Bottle x2 from 11/25 with GNR. Most likely due to E. Coli from pyelonephritis.  -Awaiting ID and sensitivities  -Continue Impinemem Day 5 -Repeat blood cultures x 2   E.Coli Pyelonephritis -  Afebrile with improving leukocytosis. Pt with left sided flank pain and CVA tenderness with leukocytosis (20.9K) and UA on 11/25 with pyuria and hematuria and stranding about the left kidney concerning for pyelonephritis. Most likely due to obstructive left renal stone present on previous imaging that passed and caused inflammation and infection.    -Blood  cultures x2 from 11/25 --> GNR  -Urine culture >100K E. Coli , pansensitive except to ampicillin  -Repeat UA 11/27 still with hematuria and pyuria, proteinuria improved  -Continue Imipenem Day 5 -due to multiple allergies, per ID no other good options--> will likely need central  line placement -Continue to monitor CBC--> leukocytosis resolved (20.9K on admission to 7.8K on 11/29)  Hypoalbuminemia -  stable. Pt with albumin of 3.2 on admission(11/25) that is downtrending to 2.6 on 11/28. Nephrotic syndrome possible considering renal failure and lower extremity edema, however no significant proteinuria (improved from admission). Liver function normal. Chronic malnutrition also possible. -Continue to monitor -Heart healthy diet -Nutrition consult -Obtain pre-albumin  History of fluid overload - currently with fluid overload--last Echo 07/31/13 showed EF of 55% with LVH and Moderate MVR. Per records he has history of compensated heart failure. Patient endorses baseline dyspnea with exertion, denies  orthopnea (two pillows) or PND. Per chart review, his Lasix was increased in July 2014 to facilitate diuresis of 34 lbs, and his lasix was decreased to 40 daily in Oct. 2014. His weight is up 11 lbs on admission. Pt received gentile IV hydration in setting of renal failure.   -Monitor daily weight --> 210 lb to 222 lb since admission -Consider restarting lasix 40 mg daily tomm --> 12lb wt gain since admission    A Fib with Asymptomatic PVCs - Currently in NSR. Patient was noted to have a new onset atrial fibrillation with a slow HR in July 2014. His cardiologist decreased the Metoprolol from 50 to 25 mg po daily. He is on Xarelto 15 Qday for anticoagulation.  - Monitor on telemetry   - Troponin negative x 1  - Hold Xarelto while GFR < 15--consider resume Xarelto when GFR > 15.  - Continue ASA while Xarelto is on hold   Acute on chronic thrombocytopenia - stable. Baseline 106-160. Last PLT was 160K in Oct. 2014. PLT 105K on admission.  - PT/APTT --> INR 2.38 -Continue to monitor CBC  Hypertension - currently normotensive. BP range 96/39 - 147/49.  -Continue metoprolol succinate 24 mg daily  -Hold amlodipine-benazepril 5-20 mg daily in setting of AKI  Hyperlipidemia - Last lipid panel on 06/11/13 within normal limits. At home on simvastatin.  -Continue home Simvastatin 20 mg daily   BPH (benign  prostatic hyperplasia)--stable, no indication of post urinary obstruction. Follows with urologist in [place].   -Continue home doxazosin 8 mg daily  -Continue home finasteride 5 mg daily   Iron Deficicency Anemia -  Asymptomatic, stable at baseline Hg of 9. Last anemia panel on 04/19/13 within normal limits. Ferritin on 10/21 within normal limits.  Most likely due to malabsorption in setting of colon resection. - Hold iron supplement in the acute setting of infection - Resume upon discharge  -Obtain anemia panel- low iron (26), low TIBC (204), low Iron sat (13%) --> most likely due to ACD due to CKD (not on outpatient Epo)    Personal history of malignant neoplasm of rectum, rectosigmoid junction, and anus  Stable, managed by his oncologist Dr. Truett Perna.  - No indication of metastatic versus compression lesions on abdomen CT scan on admission   GERD - No reports of acid reflux symptoms -Hold home protonix 40 mg daily in setting of AKI   Diet: Heart DVT PPx: SCDs (Hold Heparin in the setting of thrombocytopenia. INR 2.38 ) Code: Full     Dispo: Disposition is deferred at this time, awaiting improvement of current medical problems.  Anticipated discharge in approximately 1 day(s).   The patient does not have a current PCP Andrey Campanile Elizebeth Koller, MD) and does need an [facility] hospital follow-up appointment after discharge.  The patient does not have transportation limitations that hinder transportation to clinic appointments.  .Services Needed at time of discharge: Y = Yes, Blank = No PT:   OT:   RN:   Equipment:   Other:     LOS: 4 days   Otis Brace, MD 11/24/2013, 7:20 AM

## 2013-11-24 NOTE — Progress Notes (Signed)
Patient ID: Nicholas [name], male   DOB: 03-15-1934, 79 y.o.   MRN: [id number]  [facility] Progress Note    Assessment/ Plan:   1. Acute renal failure chronic kidney disease stage IV: Likely from ATN (hypotension, recent nonsteroidal anti-inflammatory drug use in the face of ongoing ACE inhibitor/diuretic) versus AIN from pyelonephritis to what appears to be a solitary functioning kidney. Currently, Non-oliguric and likely in the plateau phase of ATN-Continue to monitor closely anticipating some renal recovery. No acute electrolyte of volume concerns/emergent uremic symptoms to prompt consideration for dialysis. Clearly, is not volume depleted and will restart his diuretic tomorrow if creatinine stable/improving. 2. Pyelonephritis: Escherichia coli urinary tract infection however patient allergic or intolerant of several antibiotics-currently receiving Primaxin. Afebrile and without markers of sepsis at this time-leukocytosis notably better. 3. Metabolic acidosis: Most likely secondary to acute renal failure plus/minus functional acidosis with chronic kidney disease. Improved with oral sodium bicarbonate.  4. Anemia: Possibly anemia from chronic kidney disease-check iron stores and undertake replacement after infection controlled. No indications for PRBCs at this time.    Subjective:   Reports to be feeling fair-anxious about discharge home.    Objective:   BP 141/49  Pulse 56  Temp(Src) 97.7 F (36.5 C) (Oral)  Resp 19  Ht 5\' 10"  (1.778 m)  Wt 100.8 kg (222 lb 3.6 oz)  BMI 31.89 kg/m2  SpO2 97%  Intake/Output Summary (Last 24 hours) at 11/24/13 0847 Last data filed at 11/24/13 0500  Gross per 24 hour  Intake    340 ml  Output   1275 ml  Net   -935 ml   Weight change: 1.4 kg (3 lb 1.4 oz)  Physical Exam: Gen: Comfortably sitting up in his recliner CVS: Pulse irregularly irregular, S1 and S2 with an ejection systolic murmur Resp: Coarse breath sounds  bilaterally-poor respiratory effort Abd: Soft, obese, nontender and bowel sounds are normal Ext: 3+ lower extremity edema bilaterally.  Imaging: No results found.  Labs: BMET  Recent Labs Lab 11/20/13 0804 11/20/13 1355 11/21/13 0348 11/21/13 2016 11/21/13 2239 11/22/13 0500 11/23/13 0557 11/24/13 0605  NA 134*  --  130* 133* 131* 132* 136 139  K 4.7  --  4.5 5.3* 4.5 4.4 4.6 4.5  CL 101  --  99 102 100 100 104 106  CO2 22  --  18* 18* 18* 16* 20 19  GLUCOSE 90  --  82 99 96 92 97 92  BUN 48*  --  57* 64* 65* 69* 79* 81*  CREATININE 4.73*  --  5.15* 5.28* 5.32* 5.39* 5.60* 5.52*  CALCIUM 9.5  --  9.2 9.2 9.3 9.1 9.4 9.1  PHOS  --  3.0  --   --   --   --  4.3  --    CBC  Recent Labs Lab 11/20/13 0804 11/21/13 0348 11/22/13 0500 11/23/13 0557 11/24/13 0605  WBC 20.9* 17.4* 13.8* 11.5* 7.8  NEUTROABS 18.9*  --  12.3* 9.7* 6.2  HGB 9.4* 9.5* 9.0* 9.2* 9.3*  HCT 28.2* 28.6* 26.6* 27.1* 26.8*  MCV 92.8 92.3 90.5 89.1 89.0  PLT 105* 86* 81* 93* 97*   Medications:    . amLODipine  5 mg Oral Daily  . aspirin EC  81 mg Oral Daily  . doxazosin  8 mg Oral QHS  . finasteride  5 mg Oral Daily  . imipenem-cilastatin  250 mg Intravenous Q12H  . metoprolol succinate  25 mg Oral Daily  . simvastatin  20 mg Oral q1800  . sodium bicarbonate  650 mg Oral TID  . sodium chloride  3 mL Intravenous Q12H   Zetta Bills, MD 11/24/2013, 8:47 AM

## 2013-11-24 NOTE — Evaluation (Signed)
Physical Therapy Evaluation Patient Details Name: Nicholas [name] MRN: [id number] DOB: [birth year] Today's Date: 11/24/2013 Time: 8295-6213 PT Time Calculation (min): 18 min  PT Assessment / Plan / Recommendation History of Present Illness  Patient is a 79 yo male admitted with pyelonephritis, acute on chronic kidney disease, anemia.  Patient with h/o colon CA with colostomy, MI, CHF, HTN.  Clinical Impression  Patient presents with problems listed below.  Will benefit from acute PT to maximize independence and safety prior to discharge home with wife.  Recommended use of RW for safety - patient declined.  Recommend HHPT at discharge for continued therapy - unsure if patient will agree to this or not.    PT Assessment  Patient needs continued PT services    Follow Up Recommendations  Home health PT;Supervision/Assistance - 24 hour    Does the patient have the potential to tolerate intense rehabilitation      Barriers to Discharge        Equipment Recommendations  None recommended by PT    Recommendations for Other Services     Frequency Min 3X/week    Precautions / Restrictions Precautions Precautions: Fall Restrictions Weight Bearing Restrictions: No   Pertinent Vitals/Pain       Mobility  Bed Mobility Bed Mobility: Not assessed Transfers Transfers: Sit to Stand;Stand to Sit Sit to Stand: 4: Min guard;With upper extremity assist;With armrests;From chair/3-in-1 Stand to Sit: 4: Min guard;With upper extremity assist;With armrests;To chair/3-in-1 Details for Transfer Assistance: Verbal cues for hand placement and to scoot to edge of chair before standing.  Encouraged patient to move head/trunk forward over feet - initially tried to stand extending trunk backward.  Practiced x2. Ambulation/Gait Ambulation/Gait Assistance: 4: Min assist Ambulation Distance (Feet): 180 Feet Assistive device: None Ambulation/Gait Assistance Details: Patient declined use of RW.  Patient  with flexed trunk - cues to stand upright. Patient with shuffling gait, with lateral trunk motion to both sides.  Patient reaching for objects for support during gait.  Encouraged use of RW for safety - patient declined. Gait Pattern: Step-through pattern;Decreased step length - right;Decreased step length - left;Decreased stride length;Shuffle;Lateral trunk lean to right;Lateral trunk lean to left;Trunk flexed Gait velocity: Slow gait speed. General Gait Details: Patient with dyspnea of 3/4 with gait.    Exercises     PT Diagnosis: Difficulty walking;Generalized weakness  PT Problem List: Decreased strength;Decreased activity tolerance;Decreased balance;Decreased mobility;Decreased knowledge of use of DME;Decreased safety awareness;Cardiopulmonary status limiting activity;Obesity PT Treatment Interventions: DME instruction;Gait training;Stair training;Functional mobility training;Patient/family education     PT Goals(Current goals can be found in the care plan section) Acute Rehab PT Goals Patient Stated Goal: To go home and be able to work in yard PT Goal Formulation: With patient/family Time For Goal Achievement: 12/01/13 Potential to Achieve Goals: Good  Visit Information  Last PT Received On: 11/24/13 Assistance Needed: +1 History of Present Illness: Patient is a 79 yo male admitted with pyelonephritis, acute on chronic kidney disease, anemia.  Patient with h/o colon CA with colostomy, MI, CHF, HTN.       Prior Functioning  Home Living Family/patient expects to be discharged to:: Private residence Living Arrangements: Spouse/significant other Available Help at Discharge: Family;Available 24 hours/day Type of Home: House Home Access: Stairs to enter [employer] of Steps: 4 Entrance Stairs-Rails: Right;Left Home Layout: One level Home Equipment: Walker - 2 wheels;Cane - single point Prior Function Level of Independence: Independent Comments: Working in yard week  before admission Communication Communication: No difficulties  Cognition  Cognition Arousal/Alertness: Awake/alert Behavior During Therapy: WFL for tasks assessed/performed (Agitated when wife states he has been having trouble walking) Overall Cognitive Status: Within Functional Limits for tasks assessed    Extremity/Trunk Assessment Upper Extremity Assessment Upper Extremity Assessment: Overall WFL for tasks assessed Lower Extremity Assessment Lower Extremity Assessment: Generalized weakness Cervical / Trunk Assessment Cervical / Trunk Assessment: Kyphotic   Balance Balance Balance Assessed: Yes Static Standing Balance Static Standing - Balance Support: Right upper extremity supported Static Standing - Level of Assistance: 5: Stand by assistance Static Standing - Comment/# of Minutes: 2 minutes with flexed trunk.  Leaning posteriorly initially.  End of Session PT - End of Session Equipment Utilized During Treatment: Gait belt Activity Tolerance: Patient limited by fatigue Patient left: in chair;with call bell/phone within reach;with family/visitor present Nurse Communication: Mobility status  GP     Vena Austria 11/24/2013, 10:42 AM Durenda Hurt. Renaldo Fiddler, [facility] Acute Rehab Services Pager [phone]

## 2013-11-24 NOTE — Progress Notes (Signed)
  Date: 11/24/2013  Patient name: Nicholas [name]  Medical record number: [id number]  Date of birth: [birth year]   This patient has been seen and the plan of care was discussed with the house staff. Please see their note for complete details. I concur with their findings with the following additions/corrections: Feels well. No dizziness, no CP, no SOB. Solitary functioning kidney with renal function at a plateau s/p ATN. Receiving imipenem due to multiple allergies. Would ask ID about length of therapy in this case, as with bacteremia noted in 2/2 cultures will need at least 2 weeks due to sepsis as a result of pyelonephritis.  Jonah Blue, DO, FACP Faculty [facility] Internal Medicine Residency Program 11/24/2013, 1:07 PM

## 2013-11-24 NOTE — Progress Notes (Signed)
Patient setting in chair asleep and H.R. Drop to 30 At fib with pvc's awaken patient and assist pt. back to bed H.R. Up to 72. Cont. To monitor patient and rhythm/

## 2013-11-25 DIAGNOSIS — A498 Other bacterial infections of unspecified site: Secondary | ICD-10-CM

## 2013-11-25 DIAGNOSIS — A419 Sepsis, unspecified organism: Secondary | ICD-10-CM

## 2013-11-25 DIAGNOSIS — A4151 Sepsis due to Escherichia coli [E. coli]: Secondary | ICD-10-CM

## 2013-11-25 DIAGNOSIS — R7881 Bacteremia: Secondary | ICD-10-CM

## 2013-11-25 DIAGNOSIS — N12 Tubulo-interstitial nephritis, not specified as acute or chronic: Secondary | ICD-10-CM

## 2013-11-25 LAB — CBC WITH DIFFERENTIAL/PLATELET
Basophils Absolute: 0 10*3/uL (ref 0.0–0.1)
Basophils Relative: 0 % (ref 0–1)
Eosinophils Absolute: 0.2 10*3/uL (ref 0.0–0.7)
Eosinophils Relative: 3 % (ref 0–5)
Hemoglobin: 9.4 g/dL — ABNORMAL LOW (ref 13.0–17.0)
Lymphs Abs: 0.6 10*3/uL — ABNORMAL LOW (ref 0.7–4.0)
MCH: 30.6 pg (ref 26.0–34.0)
MCHC: 34.3 g/dL (ref 30.0–36.0)
Neutro Abs: 5.9 10*3/uL (ref 1.7–7.7)
Neutrophils Relative %: 79 % — ABNORMAL HIGH (ref 43–77)
Platelets: 114 10*3/uL — ABNORMAL LOW (ref 150–400)
RBC: 3.07 MIL/uL — ABNORMAL LOW (ref 4.22–5.81)

## 2013-11-25 LAB — BASIC METABOLIC PANEL
Calcium: 9.2 mg/dL (ref 8.4–10.5)
GFR calc Af Amer: 11 mL/min — ABNORMAL LOW (ref >90)
GFR calc non Af Amer: 9 mL/min — ABNORMAL LOW (ref >90)
Glucose, Bld: 93 mg/dL (ref 70–99)
Sodium: 138 mEq/L (ref 135–145)

## 2013-11-25 LAB — CULTURE, BLOOD (ROUTINE X 2)

## 2013-11-25 LAB — GLUCOSE, CAPILLARY
Glucose-Capillary: 110 mg/dL — ABNORMAL HIGH (ref 70–99)
Glucose-Capillary: 91 mg/dL (ref 70–99)

## 2013-11-25 MED ORDER — FUROSEMIDE 40 MG PO TABS
40.0000 mg | ORAL_TABLET | Freq: Every day | ORAL | Status: DC
  Administered 2013-11-25 – 2013-11-26 (×2): 40 mg via ORAL
  Filled 2013-11-25 (×2): qty 1

## 2013-11-25 MED ORDER — LEVOFLOXACIN 250 MG PO TABS
250.0000 mg | ORAL_TABLET | ORAL | Status: DC
  Administered 2013-11-26: 250 mg via ORAL
  Filled 2013-11-25: qty 1

## 2013-11-25 NOTE — Progress Notes (Addendum)
Patient ID: Nicholas [name], male   DOB: 1934/08/05, 79 y.o.   MRN: [id number]  [facility] Progress Note    Assessment/ Plan:   1. Acute renal failure chronic kidney disease stage IV: Likely from ATN (hypotension, recent nonsteroidal anti-inflammatory drug use in the face of ongoing ACE inhibitor/diuretic) versus AIN from pyelonephritis to what appears to be a solitary functioning kidney. Currently, Non-oliguric (RN admits that UOP charted likely inaccurately low) and renal recovery appears to be underway. No acute electrolyte of volume concerns/emergent uremic symptoms to prompt consideration for dialysis. I will restart his diuretic today with dropping creatinine and continued volume overload. 2. Pyelonephritis: Escherichia coli urinary tract infection however patient allergic or intolerant of several antibiotics-currently receiving Primaxin. Afebrile and without markers of sepsis at this time-leukocytosis notably better.  3. Metabolic acidosis: Most likely secondary to acute renal failure plus/minus functional acidosis with chronic kidney disease. Improved with oral sodium bicarbonate- will stop bicarbonate today.  4. Anemia: Possibly anemia from chronic kidney disease-check iron stores and undertake replacement after infection controlled. No indications for PRBCs at this time.   May be able to discharge home in the next 24-48 hours if creatinine continues to trend downwards. He will need lab followup with his primary care provider in 2-3 days after discharge. I will arrange renal follow for him with myself in 3-4 weeks (will update this on Epic)   Subjective:   Reports to be doing better and inquires about DC home   Objective:   BP 134/66  Pulse 68  Temp(Src) 97.6 F (36.4 C) (Tympanic)  Resp 18  Ht 5\' 10"  (1.778 m)  Wt 98.7 kg (217 lb 9.5 oz)  BMI 31.22 kg/m2  SpO2 99%  Intake/Output Summary (Last 24 hours) at 11/25/13 0914 Last data filed at 11/25/13 0700  Gross per  24 hour  Intake    480 ml  Output    525 ml  Net    -45 ml   Weight change: -2.1 kg (-4 lb 10.1 oz)  Physical Exam: WUJ:[id number] into hallways with walker and RN assistance FAO:ZHYQM irregularly irregular, normal S1 and S2 Resp: Decreased breath sounds over bases otherwise clear to auscultation Abd: Soft, obese, nontender and bowel sounds are normal-left lower quadrant colostomy Ext: 3+ lower extremity edema  Imaging: No results found.  Labs: BMET  Recent Labs Lab 11/20/13 0804 11/20/13 1355 11/21/13 0348 11/21/13 2016 11/21/13 2239 11/22/13 0500 11/23/13 0557 11/24/13 0605 11/25/13 0530  NA 134*  --  130* 133* 131* 132* 136 139 138  K 4.7  --  4.5 5.3* 4.5 4.4 4.6 4.5 4.8  CL 101  --  99 102 100 100 104 106 105  CO2 22  --  18* 18* 18* 16* 20 19 21   GLUCOSE 90  --  82 99 96 92 97 92 93  BUN 48*  --  57* 64* 65* 69* 79* 81* 78*  CREATININE 4.73*  --  5.15* 5.28* 5.32* 5.39* 5.60* 5.52* 5.29*  CALCIUM 9.5  --  9.2 9.2 9.3 9.1 9.4 9.1 9.2  PHOS  --  3.0  --   --   --   --  4.3  --   --    CBC  Recent Labs Lab 11/22/13 0500 11/23/13 0557 11/24/13 0605 11/25/13 0530  WBC 13.8* 11.5* 7.8 7.5  NEUTROABS 12.3* 9.7* 6.2 5.9  HGB 9.0* 9.2* 9.3* 9.4*  HCT 26.6* 27.1* 26.8* 27.4*  MCV 90.5 89.1 89.0 89.3  PLT 81*  93* 97* 114*    Medications:    . amLODipine  5 mg Oral Daily  . aspirin EC  81 mg Oral Daily  . doxazosin  8 mg Oral QHS  . finasteride  5 mg Oral Daily  . imipenem-cilastatin  250 mg Intravenous Q12H  . metoprolol succinate  25 mg Oral Daily  . simvastatin  20 mg Oral q1800  . sodium chloride  3 mL Intravenous Q12H   Zetta Bills, MD 11/25/2013, 9:14 AM

## 2013-11-25 NOTE — Progress Notes (Signed)
   CARE MANAGEMENT NOTE 11/25/2013  Patient:  Nicholas [name], Nicholas [name]   Account Number:  [id number]  Date Initiated:  11/21/2013  Documentation initiated by:  AMERSON,JULIE  Subjective/Objective Assessment:   PT ADM ON 11/20/13 WITH PYELONEPHRITIS.  PTA, PT INDEPENDENT, LIVES WITH SPOUSE.     Action/Plan:   WILL FOLLOW FOR DISCHARGE NEEDS AS PT PROGRESSES.   Anticipated DC Date:  11/24/2013   Anticipated DC Plan:  HOME/SELF CARE      DC Planning Services  CM consult      [facility] Choice  HOME HEALTH   Choice offered to / List presented to:  C-1 Patient        HH arranged  HH-1 RN  HH-10 DISEASE MANAGEMENT  HH-2 PT      Status of service:  Completed, signed off Medicare Important Message given?   (If response is "NO", the following Medicare IM given date fields will be blank) Date Medicare IM given:   Date Additional Medicare IM given:    Discharge Disposition:  HOME W HOME HEALTH SERVICES  Per UR Regulation:  Reviewed for med. necessity/level of care/duration of stay  If discussed at Long Length of Stay Meetings, dates discussed:    Comments:  11/25/13 16:45 CM spoke with pt in room to offer choice for HHPT/RN disease mgmt HF.  Pt chose [facility].  Address and contact numbers were verified.  Referral faxed to [facility].  Pt states he never received free trial card for Xarelto.  CM activated card and gave to pt.  No other CM needs were communicated.  Freddy Jaksch, BSN, CM [phone].

## 2013-11-25 NOTE — Progress Notes (Signed)
  Date: 11/25/2013  Patient name: Nicholas [name]  Medical record number: [id number]  Date of birth: 1934/06/19   This patient has been seen and the plan of care was discussed with the house staff. Please see their note for complete details. I concur with their findings with the following additions/corrections: Feels well today. Denies CP, SOB, N/V/D/C.  On exam, GEN: AAOX3, NAD CV: S1S2, no m/r/g, irreg, no JVD. PULM: CTA bilat Abd/GI: Soft, NT, +BS, no guarding, colostomy bag LLQ c/d/i skin placement. LE: 2+ pitting edema.  -AKI on CKD 4: 2nd to ATN. NSAIDs and pyelo contributing. Slow improvement of renal function. He has a solitary functioning kidney. Appreciate renal input. Restarting diuretics. -Sepsis due to E. Coli: 2/2 BC + and UC +. He was on imipenem due to multiple drug allergies. Need ID input into length of therapy and whether levaquin is appropriate at this time (would continue imipenem unless ID states otherwise). -Continue to monitor for another day until final Abx length and choice is decided, and renal approves of D/C home.  Rest per resident note.  Jonah Blue, DO, FACP Faculty [facility] Internal Medicine Residency Program 11/25/2013, 1:04 PM

## 2013-11-25 NOTE — Progress Notes (Signed)
Pt ambulated in hallway 200 ft with rolling walker on Room Air and tolerated activity well. Will continue to monitor.

## 2013-11-25 NOTE — Progress Notes (Signed)
Pt ambulated in hallway 200 ft with rolling walker on room air and tolerated activity well. Will continue to monitor.

## 2013-11-25 NOTE — Consult Note (Signed)
[facility]  Total days of antibiotics 6        Day 6 imipenem        1 dose of levo on 11/29               Reason for Consult: ecoli pyelo and bacteremia    Referring Physician: paya  Principal Problem:   Pyelonephritis, acute Active Problems:   HYPERCHOLESTEROLEMIA   ANEMIA, IRON DEFICIENCY   HYPERTENSION   Personal history of malignant neoplasm of rectum, rectosigmoid junction, and anus   Asymptomatic PVCs   BPH (benign prostatic hyperplasia)   Acute on chronic kidney failure   Pyelonephritis    HPI: Nicholas [name] is a 79 y.o. male  with a PMH of BPH  , CHF/ICM, s/p CABG in 1993, A Fib since July 2014 managed by [facility] cardiology, Rectal cancer in 2008 , who presented with left flank pain and hematuria on 11/25  He noticed decreased urine output the day prior to admit as well as the start of noticing blood in his urine. His work up revealed leukocytosis of 21K with left shift, worsening of renal function with Cr of 4.73 which has not improved during the hospitalization. Imaging revealed stranding about left kidney and atrophy to right kidney (previously known), due to concern for pyelonephritis, He was started on Imipenem due to multiple ABX allergies. Infectious work-up revealed finding e.coli (amp R amp/sub R, but otherwise pan sensitive) in blood culture as well as urine culture from 11/25. Repeat blood cx on 11/29 document clearance. Due to ambiguous intolerance to FQ, he was re-introduced to levofloxacin x 1 dose yesterday without difficulty. Renal would like to see if patient could be treated with oral regimen to spare his vascular for future use for nephrology. Patient feels "100%" better since being admitted.     Past Medical History  Diagnosis Date  . 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 Pure hypercholesterolemia     takes Vytorin daily  . Personal history of colonic polyps     adenomatous  . Diverticulosis of colon (without mention of  hemorrhage)   . Cardiovascular disease   . IHD (ischemic heart disease)   . History of colon cancer   . Colon cancer   . Hypertension     takes Amlodipine,Metoprolol,and Cardura daily  . Coronary artery disease   . Acute myocardial infarction, unspecified site, episode of care unspecified   . MI (myocardial infarction) 1993  . CHF (congestive heart failure)     takes Lasix daily  . H/O blood clots 1993  . Dysrhythmia   . Shortness of breath     with exertion  . Arthritis   . Joint pain   . Joint swelling   . Edema     to right leg-below knee;not any different than when he saw brackbill in jan 2013  . Bruises easily     takes ASA daily  . Skin cancer     nose  . GERD (gastroesophageal reflux disease)     takes Protonix daily  . Diarrhea   . Constipation   . Enlarged prostate     takes Finasteride daily  . Blood transfusion 2008  . Insomnia   . Hx of CABG     1993  . Ejection fraction     EF 51%, nuclear, 2010    Allergies:  Allergies  Allergen Reactions  . Cephalexin Other (See Comments)    "too  strong for my system" - per office note 05/05/2012  . Ciprofloxacin Other (See Comments)    Reaction unknown  . Hctz [Hydrochlorothiazide]     DIZZINESS   . Levofloxacin Other (See Comments)    "too strong for my system" - per office note 05/05/2012  . Lipitor [Atorvastatin Calcium]     MUSCLE PAIN  . Niaspan [Niacin Er]     FLUSHING   . Oxycodone-Acetaminophen Other (See Comments)    Reaction unknown  . Sulfonamide Derivatives Other (See Comments)    Reaction unkonwn     MEDICATIONS: . amLODipine  5 mg Oral Daily  . aspirin EC  81 mg Oral Daily  . doxazosin  8 mg Oral QHS  . finasteride  5 mg Oral Daily  . furosemide  40 mg Oral Daily  . [START ON 11/26/2013] levofloxacin  250 mg Oral Q48H  . metoprolol succinate  25 mg Oral Daily  . simvastatin  20 mg Oral q1800  . sodium chloride  3 mL Intravenous Q12H    History  Substance Use Topics  . Smoking  status: Former Smoker -- 10 years    Types: Pipe, Cigars    Quit date: 05/26/1991  . Smokeless tobacco: Current User    Types: Chew  . Alcohol Use: No    Family History  Problem Relation Age of Onset  . Pancreatic cancer Father   . Diabetes Son   . Emphysema Mother   . Anesthesia problems Neg Hx   . Hypotension Neg Hx   . Malignant hyperthermia Neg Hx   . Pseudochol deficiency Neg Hx      Review of Systems  Constitutional: Negative for fever, chills, diaphoresis, activity change, appetite change, fatigue and unexpected weight change.  HENT: Negative for congestion, sore throat, rhinorrhea, sneezing, trouble swallowing and sinus pressure.  Eyes: Negative for photophobia and visual disturbance.  Respiratory: Negative for cough, chest tightness, shortness of breath, wheezing and stridor.  Cardiovascular: Negative for chest pain, palpitations and leg swelling.  Gastrointestinal: Negative for nausea, vomiting, abdominal pain, diarrhea, constipation, blood in stool, abdominal distention and anal bleeding.  Genitourinary: Negative for dysuria, hematuria, flank pain and difficulty urinating.  Musculoskeletal: Negative for myalgias, back pain, joint swelling, arthralgias and gait problem.  Skin: Negative for color change, pallor, rash and wound.  Neurological: Negative for dizziness, tremors, weakness and light-headedness.  Hematological: Negative for adenopathy. Does not bruise/bleed easily.  Psychiatric/Behavioral: Negative for behavioral problems, confusion, sleep disturbance, dysphoric mood, decreased concentration and agitation.     OBJECTIVE: Temp:  [97.6 F (36.4 C)-97.7 F (36.5 C)] 97.6 F (36.4 C) (11/30 0441) Pulse Rate:  [42-105] 66 (11/30 1014) Resp:  [18-20] 18 (11/30 0441) BP: (134-151)/(54-66) 136/54 mmHg (11/30 1014) SpO2:  [99 %-100 %] 99 % (11/30 0441) Weight:  [217 lb 9.5 oz (98.7 kg)] 217 lb 9.5 oz (98.7 kg) (11/30 0441)  General: a x o by 3 in NAD Head:  normocephalic and atraumatic.  Eyes: vision grossly intact, pupils equal, pupils round, pupils reactive to light, no injection and anicteric.  Mouth: pharynx pink and moist, no erythema, and no exudates.  Neck: supple, full ROM, no thyromegaly, no JVD, and no carotid bruits.  Lungs: normal respiratory effort, no accessory muscle use, normal breath sounds, no crackles, and no wheezes. Heart: irregularly irregular rhythm, no murmur, no gallop, and no rub.  Abdomen: soft, rounded, LLQ colostomy bag, non-tender, normal bowel sounds, no distention, no guarding, no rebound tenderness, no hepatomegaly, and no splenomegaly.  Back: no cva tenderness Msk: no joint swelling, no joint warmth, and no redness over joints.  Pulses: 2+ DP/PT pulses bilaterally Extremities: No cyanosis, clubbing. B/L LE 2+ pitting edema right > Left.  Neurologic: alert & oriented X3, cranial nerves II-XII intact, strength normal in all extremities, sensation intact to light touch, and gait normal.  Skin: turgor normal and no rashes.  Psych: Oriented X3, memory intact for recent and remote, normally interactive, good eye contact, not anxious appearing, and not depressed appearing.     LABS: Results for orders placed during the hospital encounter of 11/20/13 (from the past 48 hour(s))  GLUCOSE, CAPILLARY     Status: Abnormal   Collection Time    11/23/13  4:13 PM      Result Value Range   Glucose-Capillary 119 (*) 70 - 99 mg/dL  GLUCOSE, CAPILLARY     Status: Abnormal   Collection Time    11/23/13  9:23 PM      Result Value Range   Glucose-Capillary 104 (*) 70 - 99 mg/dL  GLUCOSE, CAPILLARY     Status: None   Collection Time    11/24/13  6:02 AM      Result Value Range   Glucose-Capillary 87  70 - 99 mg/dL  PREALBUMIN     Status: Abnormal   Collection Time    11/24/13  6:05 AM      Result Value Range   Prealbumin 10.0 (*) 17.0 - 34.0 mg/dL   Comment: Performed at [facility]  CBC WITH DIFFERENTIAL      Status: Abnormal   Collection Time    11/24/13  6:05 AM      Result Value Range   WBC 7.8  4.0 - 10.5 K/uL   RBC 3.01 (*) 4.22 - 5.81 MIL/uL   Hemoglobin 9.3 (*) 13.0 - 17.0 g/dL   HCT 16.1 (*) 09.6 - 04.5 %   MCV 89.0  78.0 - 100.0 fL   MCH 30.9  26.0 - 34.0 pg   MCHC 34.7  30.0 - 36.0 g/dL   RDW 40.9 (*) 81.1 - 91.4 %   Platelets 97 (*) 150 - 400 K/uL   Comment: CONSISTENT WITH PREVIOUS RESULT   Neutrophils Relative % 79 (*) 43 - 77 %   Neutro Abs 6.2  1.7 - 7.7 K/uL   Lymphocytes Relative 8 (*) 12 - 46 %   Lymphs Abs 0.6 (*) 0.7 - 4.0 K/uL   Monocytes Relative 10  3 - 12 %   Monocytes Absolute 0.7  0.1 - 1.0 K/uL   Eosinophils Relative 3  0 - 5 %   Eosinophils Absolute 0.2  0.0 - 0.7 K/uL   Basophils Relative 0  0 - 1 %   Basophils Absolute 0.0  0.0 - 0.1 K/uL  COMPREHENSIVE METABOLIC PANEL     Status: Abnormal   Collection Time    11/24/13  6:05 AM      Result Value Range   Sodium 139  135 - 145 mEq/L   Potassium 4.5  3.5 - 5.1 mEq/L   Chloride 106  96 - 112 mEq/L   CO2 19  19 - 32 mEq/L   Glucose, Bld 92  70 - 99 mg/dL   BUN 81 (*) 6 - 23 mg/dL   Creatinine, Ser 7.82 (*) 0.50 - 1.35 mg/dL   Calcium 9.1  8.4 - 95.6 mg/dL   Total Protein 5.8 (*) 6.0 - 8.3 g/dL   Albumin 2.5 (*) 3.5 -  5.2 g/dL   AST 21  0 - 37 U/L   ALT 15  0 - 53 U/L   Alkaline Phosphatase 81  39 - 117 U/L   Total Bilirubin 1.0  0.3 - 1.2 mg/dL   GFR calc non Af Amer 9 (*) >90 mL/min   GFR calc Af Amer 10 (*) >90 mL/min   Comment: (NOTE)     The eGFR has been calculated using the CKD EPI equation.     This calculation has not been validated in all clinical situations.     eGFR's persistently <90 mL/min signify possible Chronic Kidney     Disease.  GLUCOSE, CAPILLARY     Status: None   Collection Time    11/24/13 11:34 AM      Result Value Range   Glucose-Capillary 84  70 - 99 mg/dL   Comment 1 Notify RN    CULTURE, BLOOD (ROUTINE X 2)     Status: None   Collection Time    11/24/13 12:00 PM        Result Value Range   Specimen Description BLOOD RIGHT HAND     Special Requests BOTTLES DRAWN AEROBIC ONLY 5CC     Culture  Setup Time       Value: 11/24/2013 17:23     Performed at [facility]   Culture       Value:        BLOOD CULTURE RECEIVED NO GROWTH TO DATE CULTURE WILL BE HELD FOR 5 DAYS BEFORE ISSUING A FINAL NEGATIVE REPORT     Performed at [facility]   Report Status PENDING    CULTURE, BLOOD (ROUTINE X 2)     Status: None   Collection Time    11/24/13 12:44 PM      Result Value Range   Specimen Description BLOOD RIGHT ARM     Special Requests BOTTLES DRAWN AEROBIC AND ANAEROBIC 10CC     Culture  Setup Time       Value: 11/24/2013 17:23     Performed at [facility]   Culture       Value:        BLOOD CULTURE RECEIVED NO GROWTH TO DATE CULTURE WILL BE HELD FOR 5 DAYS BEFORE ISSUING A FINAL NEGATIVE REPORT     Performed at [facility]   Report Status PENDING    GLUCOSE, CAPILLARY     Status: None   Collection Time    11/24/13  4:12 PM      Result Value Range   Glucose-Capillary 83  70 - 99 mg/dL   Comment 1 Notify RN    GLUCOSE, CAPILLARY     Status: Abnormal   Collection Time    11/24/13  9:41 PM      Result Value Range   Glucose-Capillary 110 (*) 70 - 99 mg/dL  BASIC METABOLIC PANEL     Status: Abnormal   Collection Time    11/25/13  5:30 AM      Result Value Range   Sodium 138  135 - 145 mEq/L   Potassium 4.8  3.5 - 5.1 mEq/L   Chloride 105  96 - 112 mEq/L   CO2 21  19 - 32 mEq/L   Glucose, Bld 93  70 - 99 mg/dL   BUN 78 (*) 6 - 23 mg/dL   Creatinine, Ser 1.61 (*) 0.50 - 1.35 mg/dL   Calcium 9.2  8.4 - 09.6 mg/dL   GFR calc  non Af Amer 9 (*) >90 mL/min   GFR calc Af Amer 11 (*) >90 mL/min   Comment: (NOTE)     The eGFR has been calculated using the CKD EPI equation.     This calculation has not been validated in all clinical situations.     eGFR's persistently <90 mL/min signify possible Chronic Kidney      Disease.  CBC WITH DIFFERENTIAL     Status: Abnormal   Collection Time    11/25/13  5:30 AM      Result Value Range   WBC 7.5  4.0 - 10.5 K/uL   RBC 3.07 (*) 4.22 - 5.81 MIL/uL   Hemoglobin 9.4 (*) 13.0 - 17.0 g/dL   HCT 60.6 (*) 30.1 - 60.1 %   MCV 89.3  78.0 - 100.0 fL   MCH 30.6  26.0 - 34.0 pg   MCHC 34.3  30.0 - 36.0 g/dL   RDW 09.3 (*) 23.5 - 57.3 %   Platelets 114 (*) 150 - 400 K/uL   Comment: CONSISTENT WITH PREVIOUS RESULT   Neutrophils Relative % 79 (*) 43 - 77 %   Neutro Abs 5.9  1.7 - 7.7 K/uL   Lymphocytes Relative 9 (*) 12 - 46 %   Lymphs Abs 0.6 (*) 0.7 - 4.0 K/uL   Monocytes Relative 9  3 - 12 %   Monocytes Absolute 0.7  0.1 - 1.0 K/uL   Eosinophils Relative 3  0 - 5 %   Eosinophils Absolute 0.2  0.0 - 0.7 K/uL   Basophils Relative 0  0 - 1 %   Basophils Absolute 0.0  0.0 - 0.1 K/uL  GLUCOSE, CAPILLARY     Status: None   Collection Time    11/25/13  6:16 AM      Result Value Range   Glucose-Capillary 91  70 - 99 mg/dL  GLUCOSE, CAPILLARY     Status: Abnormal   Collection Time    11/25/13 11:14 AM      Result Value Range   Glucose-Capillary 102 (*) 70 - 99 mg/dL   Comment 1 Notify RN      MICRO: 11/25 urine cx ecoli (R amp, S to all others) 11/25 blood cx ecoli ( R amp, R amp/sub, S to all others) 11/29 blood cx NGTD  Assessment/Plan:  79yo M with hx of atrophic right kidney, CKD, history of colon ca s/p colostomy, multiple antibiotic allergies presents with ecoli pyelonephritis and bacteremia. Now improved , on day #6 imipenem.  ecoli pyelonephritis and associated bacteremia = can treat with fluoroquinolones of levofloxacin 500mg  PO every other day, next dose on 12/1. Would treat for a total of 14 days. Using 11/26 as day #1; thus,last dose to be given on dec 9th.  Duke Salvia Drue Second MD MPH [facility] [phone]

## 2013-11-25 NOTE — Progress Notes (Addendum)
Subjective:  Pt seen and examined. No acute events overnight. Pt reports he is doing well. He denies fever, chills, flank pain, nausea, vomiting, abdominal pain, or change in BM. Reports his urination is increasing with no hematuria. Reports LE swelling is possibly worse. No headache, lightheadedness, palpitations, dyspnea, or CP. Reports he is able to ambulate without difficulty.   Objective: Vital signs in last 24 hours: Filed Vitals:   11/24/13 1349 11/24/13 2003 11/25/13 0441 11/25/13 0700  BP: 138/58 151/54 134/66   Pulse: 57 42 105 68  Temp: 97.6 F (36.4 C) 97.7 F (36.5 C) 97.6 F (36.4 C)   TempSrc: Oral  Tympanic   Resp: 19 20 18    Height:      Weight:   98.7 kg (217 lb 9.5 oz)   SpO2: 95% 100% 99%    Weight change: -2.1 kg (-4 lb 10.1 oz)  Intake/Output Summary (Last 24 hours) at 11/25/13 1610 Last data filed at 11/25/13 0700  Gross per 24 hour  Intake    480 ml  Output    525 ml  Net    -45 ml   General: NAD Head: normocephalic and atraumatic.  Eyes: vision grossly intact, pupils equal, pupils round, pupils reactive to light, no injection and anicteric.  Mouth: pharynx pink and moist, no erythema, and no exudates.  Neck: supple, full ROM, no thyromegaly, no JVD, and no carotid bruits.  Lungs: normal respiratory effort, no accessory muscle use, normal breath sounds, no crackles, and no wheezes. Heart: irregularly irregular rhythm, no murmur, no gallop, and no rub.  Abdomen: soft, rounded, LLQ colostomy bag, non-tender, normal bowel sounds, no distention, no guarding, no rebound tenderness, no hepatomegaly, and no splenomegaly. No CVA tenderness  Msk: no joint swelling, no joint warmth, and no redness over joints.  Extremities:  B/L +3 LE pitting edema   Neurologic: alert & oriented X3, cranial nerves II-XII intact, strength normal in all extremities, sensation intact to light touch, and gait normal.  Skin: turgor normal and no rashes. Multiple lesions on  face Psych: Oriented X3, memory intact for recent and remote, normally interactive, good eye contact, not anxious appearing, and not depressed appearing.    Lab Results: Basic Metabolic Panel:  Recent Labs Lab 11/20/13 1355  11/23/13 0557 11/24/13 0605 11/25/13 0530  NA  --   < > 136 139 138  K  --   < > 4.6 4.5 4.8  CL  --   < > 104 106 105  CO2  --   < > 20 19 21   GLUCOSE  --   < > 97 92 93  BUN  --   < > 79* 81* 78*  CREATININE  --   < > 5.60* 5.52* 5.29*  CALCIUM  --   < > 9.4 9.1 9.2  MG 2.0  --  2.4  --   --   PHOS 3.0  --  4.3  --   --   < > = values in this interval not displayed. Liver Function Tests:  Recent Labs Lab 11/20/13 1355 11/23/13 0557 11/24/13 0605  AST 19  --  21  ALT 14  --  15  ALKPHOS 64  --  81  BILITOT 1.2  --  1.0  PROT 6.2  --  5.8*  ALBUMIN 3.2* 2.6* 2.5*    Recent Labs Lab 11/20/13 1500  LIPASE 14   No results found for this basename: AMMONIA,  in the last  168 hours CBC:  Recent Labs Lab 11/24/13 0605 11/25/13 0530  WBC 7.8 7.5  NEUTROABS 6.2 5.9  HGB 9.3* 9.4*  HCT 26.8* 27.4*  MCV 89.0 89.3  PLT 97* 114*   Cardiac Enzymes:  Recent Labs Lab 11/20/13 1150 11/20/13 1825 11/20/13 2330  TROPONINI <0.30 <0.30 <0.30   BNP: No results found for this basename: PROBNP,  in the last 168 hours D-Dimer: No results found for this basename: DDIMER,  in the last 168 hours CBG:  Recent Labs Lab 11/23/13 2123 11/24/13 0602 11/24/13 1134 11/24/13 1612 11/24/13 2141 11/25/13 0616  GLUCAP 104* 87 84 83 110* 91   Hemoglobin A1C: No results found for this basename: HGBA1C,  in the last 168 hours Fasting Lipid Panel: No results found for this basename: CHOL, HDL, LDLCALC, TRIG, CHOLHDL, LDLDIRECT,  in the last 168 hours Thyroid Function Tests:  Recent Labs Lab 11/20/13 1355  TSH 1.457   Coagulation:  Recent Labs Lab 11/20/13 1355 11/21/13 0348  LABPROT 28.6* 25.2*  INR 2.81* 2.38*   Anemia Panel:  Recent  Labs Lab 11/23/13 0557  VITAMINB12 436  FOLATE >20.0  FERRITIN 260  TIBC 204*  IRON 26*  RETICCTPCT 0.8   Urine Drug Screen: Drugs of Abuse  No results found for this basename: labopia,  cocainscrnur,  labbenz,  amphetmu,  thcu,  labbarb    Alcohol Level: No results found for this basename: ETH,  in the last 168 hours Urinalysis:  Recent Labs Lab 11/20/13 0630 11/22/13 1504  COLORURINE RED* YELLOW  LABSPEC 1.021 1.007  PHURINE 5.5 5.5  GLUCOSEU NEGATIVE NEGATIVE  HGBUR LARGE* LARGE*  BILIRUBINUR MODERATE* NEGATIVE  KETONESUR 15* NEGATIVE  PROTEINUR >300* 30*  UROBILINOGEN 1.0 0.2  NITRITE POSITIVE* NEGATIVE  LEUKOCYTESUR LARGE* LARGE*   Micro Results: Recent Results (from the past 240 hour(s))  URINE CULTURE     Status: None   Collection Time    11/20/13  6:30 AM      Result Value Range Status   Specimen Description URINE, CLEAN CATCH   Final   Special Requests NONE   Final   Culture  Setup Time     Final   Value: 11/20/2013 07:04     Performed at [facility] Count     Final   Value: >=100,000 COLONIES/ML     Performed at [facility]   Culture     Final   Value: ESCHERICHIA COLI     Performed at [facility]   Report Status 11/22/2013 FINAL   Final   Organism ID, Bacteria ESCHERICHIA COLI   Final  CULTURE, BLOOD (ROUTINE X 2)     Status: None   Collection Time    11/20/13 11:15 AM      Result Value Range Status   Specimen Description BLOOD RIGHT ANTECUBITAL   Final   Special Requests BOTTLES DRAWN AEROBIC ONLY 5CC   Final   Culture  Setup Time     Final   Value: 11/20/2013 17:00     Performed at [facility]   Culture     Final   Value: ESCHERICHIA COLI     Note: SUSCEPTIBILITIES PERFORMED ON PREVIOUS CULTURE WITHIN THE LAST 5 DAYS.     0753 Note: Gram Stain Report Called to,Read Back By and Verified With: Gerrit Halls 11/20/2013 [facility]     Performed at [facility]   Report Status 11/25/2013 FINAL    Final  CULTURE, BLOOD (ROUTINE  X 2)     Status: None   Collection Time    11/20/13 11:20 AM      Result Value Range Status   Specimen Description BLOOD RIGHT ARM   Final   Special Requests BOTTLES DRAWN AEROBIC ONLY 8CC   Final   Culture  Setup Time     Final   Value: 11/20/2013 16:59     Performed at [facility]   Culture     Final   Value: ESCHERICHIA COLI     Note: Gram Stain Report Called to,Read Back By and Verified WithLowanda Foster B @ [phone] 11/23/13 BY KRAWS     Performed at [facility]   Report Status 11/25/2013 FINAL   Final   Organism ID, Bacteria ESCHERICHIA COLI   Final   Studies/Results: No results found. Medications: I have reviewed the patient's current medications. Scheduled Meds: . amLODipine  5 mg Oral Daily  . aspirin EC  81 mg Oral Daily  . doxazosin  8 mg Oral QHS  . finasteride  5 mg Oral Daily  . furosemide  40 mg Oral Daily  . imipenem-cilastatin  250 mg Intravenous Q12H  . metoprolol succinate  25 mg Oral Daily  . simvastatin  20 mg Oral q1800  . sodium chloride  3 mL Intravenous Q12H   Continuous Infusions:   PRN Meds:.sodium chloride, diphenhydrAMINE, sodium chloride Assessment/Plan: Principal Problem:   Pyelonephritis, acute Active Problems:   HYPERCHOLESTEROLEMIA   ANEMIA, IRON DEFICIENCY   HYPERTENSION   Personal history of malignant neoplasm of rectum, rectosigmoid junction, and anus   Asymptomatic PVCs   BPH (benign prostatic hyperplasia)   Acute on chronic kidney failure   Pyelonephritis    AKI on CKD   - nonoliguric, Cr downtrending, acidosis resolved. Baseline Cr 1.6-2.1.  Etiology of CKD unknown. FeUrea (32.2) indicating pre-renal azotemia. Due to uptrending Cr, most likely ATN component (BUN/Cr: 10.1, hypotension on admission, home ACEi, diuretics, NSAID) vs AIN from pyelonephritis (1 functioning kidney). No fever, rash, or peripheral eosinophilia to suggest AIN. Glomerulonephritis or nephrotic syndrome also possible  considering hypoalbuminemia, however no casts, hypertension, or significant proteinuria or edema.  Bladder scan on 11/26 with minimal PRV (0-22) so post-renal etiology unlikely, however pt with BPH that is medically managed. Pt on diuretic therapy for chronic LE edema, etiology unknown. Pt received IVF 50/h x 12 hr on 11/25 and 14mL/hr x 10 hr on 11/26 without improvement of renal function.  No recent contrast. Reports using  NSAIDs (naproxen) recently at home.  -Strict I & O's--> Uoutput yesterday (most likely inaccurate per RN) -Monitor Weight --> 210 lb to 217 lb since admission -Obtain bladder scan (11/26) w/ PRV--> WNL -Monitor Cr--> 4.73 on admission (11/25) to 5.60 yesteday to 5.29 today -Resume home Lasix 40 mg daily per nephro recs -Avoid nephrotoxins - hold home ACEi, avoid NSAIDs, ARB, contrast, hold protonix -Metabolic acidosis resolved ---> per nephro recs  d/c PO bicarbonate 650 mg TID -PT Consult --> home health PT  -OT Consult --> No recommendations  Gram negative rod bacteremia - Bottle x2 from 11/25 with  E. Coli most likely from pyelonephritis as urine cultures also with E. Coli growth.  -Awaiting ID and sensitivities --> resistant to ampicillin & unasyn -Repeat blood cultures x 2 (11/29) - NGTD -Continue Imipenem Day 6 --tolerated Levofloxacin 500 mg on 11/29--per ID to continue for 14 day total course (Day 5/14) with Levofloxacin 250 mg Q48 tomm (12/1)  -ID consult  E.Coli  Pyelonephritis complicated by bacteremia -  Afebrile with improving leukocytosis. Pt with left sided flank pain and CVA tenderness with leukocytosis (20.9K) and UA on 11/25 with pyuria and hematuria and stranding about the left kidney concerning for pyelonephritis. Most likely due to obstructive left renal stone present on previous imaging that passed and caused inflammation and infection.    -Urine culture >100K E. Coli , pansensitive except to ampicillin  -Repeat UA 11/27 still with hematuria and  pyuria, proteinuria improved  -Continue Imipenem Day 6 --tolerated Levofloxacin 500 mg on 11/29--per ID to continue for 14 day total course (Day 5/14) with Levofloxacin 250 mg Q48 tomm (12/1)  -ID consult  -Continue to monitor CBC--> leukocytosis resolved (20.9K on admission to 7.8K on 11/29)  Hypoalbuminemia - stable. Pt with albumin of 3.2 on admission(11/25) that is downtrending to 2.6 on 11/28. Nephrotic syndrome possible considering renal failure and lower extremity edema, however no significant proteinuria (improved from admission). Liver function normal. Chronic malnutrition also possible. -Continue to monitor -Heart healthy diet -Nutrition consult --> no recommendations  -Obtain pre-albumin --> low (10L)  Congestive Heart Failure - Last Echo 07/31/13 showed EF of 55% with LVH and moderate MVR. Per records he has history of compensated heart failure. Patient reported baseline dyspnea with exertion, orthopnea (two pillows), and chronic LE edema. Per chart review, his Lasix was increased in July 2014 to facilitate diuresis of 34 lbs, and his lasix was decreased to 40 daily in Oct. 2014. Wt gain of 11 lbs o admission. Pt received gentile IV hydration in setting of renal failure initially and then lasix was restarted on 11/30.  -Monitor daily weight --> 210 lb to 217lb since admission -Resume lasix 40 mg daily tomm --> 17lb wt gain since admission   -Home health RN for HF  A Fib with Asymptomatic PVCs - Currently in NSR. Patient was noted to have a new onset atrial fibrillation with a slow HR in July 2014. His cardiologist decreased the Metoprolol from 50 to 25 mg po daily. He is on Xarelto 15 Qday for anticoagulation.  - Monitor on telemetry   - Troponin negative x 1  - Hold Xarelto while GFR < 15-  - Continue ASA while Xarelto is on hold   Chronic thrombocytopenia - stable. Baseline 106-160. Last PLT was 160K in Oct. 2014. PLT 105K on admission.  - PT/APTT --> INR 2.38 -Continue to monitor  CBC  Hypertension - currently normotensive.  -Continue metoprolol succinate 24 mg daily  -Hold amlodipine-benazepril 5-20 mg daily in setting of AKI  Hyperlipidemia - Last lipid panel on 06/11/13 within normal limits. At home on simvastatin.  -Continue home Simvastatin 20 mg daily   BPH (benign prostatic hyperplasia)--stable, no indication of post urinary obstruction. Follows with urologist in [place].   -Continue home doxazosin 8 mg daily  -Continue home finasteride 5 mg daily   Iron Deficicency Anemia -  Asymptomatic, stable at baseline Hg of 9. Last anemia panel on 04/19/13 within normal limits. Ferritin on 10/21 within normal limits.  Most likely due to malabsorption in setting of colon resection. - Hold iron supplement in the acute setting of infection - Resume iron supplements upon discharge  -Obtain anemia panel---> low iron (26), low TIBC (204), low Iron sat (13%) --> most likely due to ACD due to CKD (not on outpatient Epo)    Malignant neoplasm of rectum, rectosigmoid junction, and anus -Stable, managed by his oncologist Dr. Truett Perna. There was no indication of metastatic versus compression lesions on  abdomen CT scan on admission.  GERD - No reports of acid reflux symptoms -Hold home protonix 40 mg daily in setting of AKI   Diet: Heart DVT PPx: SCDs (Hold Heparin in the setting of thrombocytopenia. INR 2.38 ) Code: Full     Dispo: Disposition is deferred at this time, awaiting improvement of current medical problems.  Anticipated discharge in approximately 1 day(s).   The patient does not have a current PCP Andrey Campanile Elizebeth Koller, MD) and does need an [facility] hospital follow-up appointment after discharge.  The patient does not have transportation limitations that hinder transportation to clinic appointments.  .Services Needed at time of discharge: Y = Yes, Blank = No PT:   OT:   RN:   Equipment:   Other:     LOS: 5 days   Otis Brace, MD 11/25/2013, 9:37 AM

## 2013-11-26 DIAGNOSIS — N10 Acute tubulo-interstitial nephritis: Principal | ICD-10-CM

## 2013-11-26 DIAGNOSIS — I509 Heart failure, unspecified: Secondary | ICD-10-CM

## 2013-11-26 DIAGNOSIS — I4891 Unspecified atrial fibrillation: Secondary | ICD-10-CM

## 2013-11-26 DIAGNOSIS — D649 Anemia, unspecified: Secondary | ICD-10-CM

## 2013-11-26 DIAGNOSIS — N189 Chronic kidney disease, unspecified: Secondary | ICD-10-CM

## 2013-11-26 DIAGNOSIS — I129 Hypertensive chronic kidney disease with stage 1 through stage 4 chronic kidney disease, or unspecified chronic kidney disease: Secondary | ICD-10-CM

## 2013-11-26 LAB — BASIC METABOLIC PANEL
BUN: 78 mg/dL — ABNORMAL HIGH (ref 6–23)
CO2: 23 mEq/L (ref 19–32)
Calcium: 9.3 mg/dL (ref 8.4–10.5)
Creatinine, Ser: 5.33 mg/dL — ABNORMAL HIGH (ref 0.50–1.35)
GFR calc non Af Amer: 9 mL/min — ABNORMAL LOW (ref >90)
Glucose, Bld: 92 mg/dL (ref 70–99)
Sodium: 139 mEq/L (ref 135–145)

## 2013-11-26 LAB — CBC WITH DIFFERENTIAL/PLATELET
Basophils Absolute: 0 10*3/uL (ref 0.0–0.1)
Basophils Relative: 0 % (ref 0–1)
Eosinophils Absolute: 0.3 10*3/uL (ref 0.0–0.7)
MCH: 30.9 pg (ref 26.0–34.0)
MCHC: 34.6 g/dL (ref 30.0–36.0)
Neutrophils Relative %: 77 % (ref 43–77)
Platelets: 118 10*3/uL — ABNORMAL LOW (ref 150–400)
RDW: 17.7 % — ABNORMAL HIGH (ref 11.5–15.5)

## 2013-11-26 MED ORDER — ASPIRIN 81 MG PO TBEC: 81.0000 mg | DELAYED_RELEASE_TABLET | Freq: Every day | ORAL | Status: DC

## 2013-11-26 MED ORDER — LEVOFLOXACIN 250 MG PO TABS: 250.0000 mg | ORAL_TABLET | ORAL | Status: DC

## 2013-11-26 NOTE — Progress Notes (Signed)
Discharge instructions along with med list provided. IV d/c'd with catheter intact. Patient transported via wheelchair to main lobby for d/c home. Belongings with patient and wife.Mamie Levers

## 2013-11-26 NOTE — Progress Notes (Addendum)
Subjective:  Pt seen and examined. No acute events overnight. Pt reports he "feels great" and wants to go home. He denies fever, chills, flank pain, nausea, vomiting, abdominal pain, or change in BM. Reports his urination is increasing with no hematuria. He is amenable to home health PT and RN.   Objective: Vital signs in last 24 hours: Filed Vitals:   11/25/13 1534 11/25/13 2056 11/26/13 0351 11/26/13 0353  BP: 132/52 153/50  138/54  Pulse: 64 55  51  Temp: 97.3 F (36.3 C) 98 F (36.7 C)  97.7 F (36.5 C)  TempSrc: Oral Oral  Oral  Resp: 16 18  16   Height:      Weight:   213 lb 13.5 oz (97 kg)   SpO2: 100% 99%  99%   Weight change: -3 lb 12 oz (-1.7 kg)  Intake/Output Summary (Last 24 hours) at 11/26/13 0727 Last data filed at 11/26/13 0350  Gross per 24 hour  Intake    120 ml  Output    600 ml  Net   -480 ml   General: NAD Head: normocephalic and atraumatic.  Eyes: vision grossly intact, pupils equal, pupils round, pupils reactive to light, no injection and anicteric.  Mouth: pharynx pink and moist, no erythema, and no exudates.  Neck: supple, full ROM, no thyromegaly, no JVD, and no carotid bruits.  Lungs: normal respiratory effort, no accessory muscle use, normal breath sounds, no crackles, and no wheezes. Heart: RRR, no murmur, no gallop, and no rub.  Abdomen: soft, nontender, LLQ colostomy bag, non-tender, normal bowel sounds, no distention, no guarding, no rebound tenderness, no hepatomegaly, and no splenomegaly. No CVA tenderness. Msk: no joint swelling, no joint warmth, and no redness over joints.  Extremities:  B/L +2-3 LE pitting edema   Neurologic: alert & oriented X3, cranial nerves II-XII intact, strength normal in all extremities, sensation intact to light touch, and gait normal.  Skin: turgor normal and no rashes. Psych: Oriented X3, memory intact for recent and remote, normally interactive, good eye contact, not anxious appearing, and not  depressed appearing.    Lab Results: Basic Metabolic Panel:  Recent Labs Lab 11/20/13 1355  11/23/13 0557  11/25/13 0530 11/26/13 0507  NA  --   < > 136  < > 138 139  K  --   < > 4.6  < > 4.8 5.0  CL  --   < > 104  < > 105 107  CO2  --   < > 20  < > 21 23  GLUCOSE  --   < > 97  < > 93 92  BUN  --   < > 79*  < > 78* 78*  CREATININE  --   < > 5.60*  < > 5.29* 5.33*  CALCIUM  --   < > 9.4  < > 9.2 9.3  MG 2.0  --  2.4  --   --   --   PHOS 3.0  --  4.3  --   --   --   < > = values in this interval not displayed. Liver Function Tests:  Recent Labs Lab 11/20/13 1355 11/23/13 0557 11/24/13 0605  AST 19  --  21  ALT 14  --  15  ALKPHOS 64  --  81  BILITOT 1.2  --  1.0  PROT 6.2  --  5.8*  ALBUMIN 3.2* 2.6* 2.5*    Recent Labs Lab 11/20/13 1500  LIPASE 14   CBC:  Recent Labs Lab 11/25/13 0530 11/26/13 0507  WBC 7.5 8.2  NEUTROABS 5.9 6.3  HGB 9.4* 9.4*  HCT 27.4* 27.2*  MCV 89.3 89.5  PLT 114* 118*   Cardiac Enzymes:  Recent Labs Lab 11/20/13 1150 11/20/13 1825 11/20/13 2330  TROPONINI <0.30 <0.30 <0.30   CBG:  Recent Labs Lab 11/24/13 0602 11/24/13 1134 11/24/13 1612 11/24/13 2141 11/25/13 0616 11/25/13 1114  GLUCAP 87 84 83 110* 91 102*   Thyroid Function Tests:  Recent Labs Lab 11/20/13 1355  TSH 1.457   Coagulation:  Recent Labs Lab 11/20/13 1355 11/21/13 0348  LABPROT 28.6* 25.2*  INR 2.81* 2.38*   Anemia Panel:  Recent Labs Lab 11/23/13 0557  VITAMINB12 436  FOLATE >20.0  FERRITIN 260  TIBC 204*  IRON 26*  RETICCTPCT 0.8   Urinalysis:  Recent Labs Lab 11/20/13 0630 11/22/13 1504  COLORURINE RED* YELLOW  LABSPEC 1.021 1.007  PHURINE 5.5 5.5  GLUCOSEU NEGATIVE NEGATIVE  HGBUR LARGE* LARGE*  BILIRUBINUR MODERATE* NEGATIVE  KETONESUR 15* NEGATIVE  PROTEINUR >300* 30*  UROBILINOGEN 1.0 0.2  NITRITE POSITIVE* NEGATIVE  LEUKOCYTESUR LARGE* LARGE*   Micro Results: Recent Results (from the past 240  hour(s))  URINE CULTURE     Status: None   Collection Time    11/20/13  6:30 AM      Result Value Range Status   Specimen Description URINE, CLEAN CATCH   Final   Special Requests NONE   Final   Culture  Setup Time     Final   Value: 11/20/2013 07:04     Performed at [facility] Count     Final   Value: >=100,000 COLONIES/ML     Performed at [facility]   Culture     Final   Value: ESCHERICHIA COLI     Performed at [facility]   Report Status 11/22/2013 FINAL   Final   Organism ID, Bacteria ESCHERICHIA COLI   Final  CULTURE, BLOOD (ROUTINE X 2)     Status: None   Collection Time    11/20/13 11:15 AM      Result Value Range Status   Specimen Description BLOOD RIGHT ANTECUBITAL   Final   Special Requests BOTTLES DRAWN AEROBIC ONLY 5CC   Final   Culture  Setup Time     Final   Value: 11/20/2013 17:00     Performed at [facility]   Culture     Final   Value: ESCHERICHIA COLI     Note: SUSCEPTIBILITIES PERFORMED ON PREVIOUS CULTURE WITHIN THE LAST 5 DAYS.     0753 Note: Gram Stain Report Called to,Read Back By and Verified With: Gerrit Halls 11/20/2013 FULKC     Performed at [facility]   Report Status 11/25/2013 FINAL   Final  CULTURE, BLOOD (ROUTINE X 2)     Status: None   Collection Time    11/20/13 11:20 AM      Result Value Range Status   Specimen Description BLOOD RIGHT ARM   Final   Special Requests BOTTLES DRAWN AEROBIC ONLY 8CC   Final   Culture  Setup Time     Final   Value: 11/20/2013 16:59     Performed at [facility]   Culture     Final   Value: ESCHERICHIA COLI     Note: Gram Stain Report Called to,Read Back By and Verified With: BRITTANY B @  1610 11/23/13 BY KRAWS     Performed at [facility]   Report Status 11/25/2013 FINAL   Final   Organism ID, Bacteria ESCHERICHIA COLI   Final  CULTURE, BLOOD (ROUTINE X 2)     Status: None   Collection Time    11/24/13 12:00 PM      Result Value  Range Status   Specimen Description BLOOD RIGHT HAND   Final   Special Requests BOTTLES DRAWN AEROBIC ONLY 5CC   Final   Culture  Setup Time     Final   Value: 11/24/2013 17:23     Performed at [facility]   Culture     Final   Value:        BLOOD CULTURE RECEIVED NO GROWTH TO DATE CULTURE WILL BE HELD FOR 5 DAYS BEFORE ISSUING A FINAL NEGATIVE REPORT     Performed at [facility]   Report Status PENDING   Incomplete  CULTURE, BLOOD (ROUTINE X 2)     Status: None   Collection Time    11/24/13 12:44 PM      Result Value Range Status   Specimen Description BLOOD RIGHT ARM   Final   Special Requests BOTTLES DRAWN AEROBIC AND ANAEROBIC 10CC   Final   Culture  Setup Time     Final   Value: 11/24/2013 17:23     Performed at [facility]   Culture     Final   Value:        BLOOD CULTURE RECEIVED NO GROWTH TO DATE CULTURE WILL BE HELD FOR 5 DAYS BEFORE ISSUING A FINAL NEGATIVE REPORT     Performed at [facility]   Report Status PENDING   Incomplete   Studies/Results: No results found. Medications: I have reviewed the patient's current medications. Scheduled Meds: . amLODipine  5 mg Oral Daily  . aspirin EC  81 mg Oral Daily  . doxazosin  8 mg Oral QHS  . finasteride  5 mg Oral Daily  . furosemide  40 mg Oral Daily  . levofloxacin  250 mg Oral Q48H  . metoprolol succinate  25 mg Oral Daily  . simvastatin  20 mg Oral q1800  . sodium chloride  3 mL Intravenous Q12H   Continuous Infusions:   PRN Meds:.sodium chloride, diphenhydrAMINE, sodium chloride Assessment/Plan: Principal Problem:   Pyelonephritis, acute Active Problems:   HYPERCHOLESTEROLEMIA   ANEMIA, IRON DEFICIENCY   HYPERTENSION   Personal history of malignant neoplasm of rectum, rectosigmoid junction, and anus   Asymptomatic PVCs   BPH (benign prostatic hyperplasia)   Acute on chronic kidney failure   Pyelonephritis  AKI on CKD   - nonoliguric, Cr stable, acidosis resolved.  Baseline Cr 1.6-2.1, now ~2.3 which could represent new baseline.  Etiology of CKD unknown. FeUrea (32.2) indicating pre-renal azotemia. Due to uptrending Cr, most likely ATN component (BUN/Cr: 10.1, hypotension on admission, home ACEi, diuretics, NSAID) vs AIN from pyelonephritis (1 functioning kidney). No fever, rash, or peripheral eosinophilia to suggest AIN. Glomerulonephritis or nephrotic syndrome also possible considering hypoalbuminemia, however no casts, hypertension, or significant proteinuria or edema.  Bladder scan on 11/26 with minimal PRV (0-22) so post-renal etiology unlikely, however pt with BPH that is medically managed. Pt on diuretic therapy for chronic LE edema, etiology unknown. Pt received IVF 50/h x 12 hr on 11/25 and 39mL/hr x 10 hr on 11/26 without improvement of renal function.  No recent contrast. Reports using  NSAIDs (naproxen) recently  at home.  -Strict I & O's--> Uoutput 1000cc yesterday, 400cc this am -Monitor Weight --> 210 lb to 217 lb since admission -Obtain bladder scan (11/26) w/ PRV--> WNL -Monitor Cr--> 4.73 on admission (11/25) to 5.29 yesterday and 5.3 today -Resume home Lasix 40 mg daily per nephro recs -Avoid nephrotoxins - hold home ACEi, avoid NSAIDs, ARB, contrast, hold protonix -Metabolic acidosis resolved ---> per nephro recs  d/c PO bicarbonate 650 mg TID -PT Consult --> home health PT  -OT Consult --> No recommendations - Medically stable for discharge today, Nephrology followup is scheduled with Dr. Zetta Bills at [facility], [address] on12/29/14 at 3:15 PM  - Per renal will need follow up labs with his primary care provider 2-3 days post hospital discharge  Gram negative rod bacteremia - Bottle x2 from 11/25 with  E. Coli most likely from pyelonephritis as urine cultures also with E. Coli growth.  - Appreciate ID recs -Awaiting ID and sensitivities --> resistant to ampicillin & unasyn -Repeat blood cultures x 2 (11/29) - NGTD -Continue  Imipenem Day 6 --tolerated Levofloxacin 500 mg on 11/29--per ID to continue for 14 day total course (Day 5/14) with Levofloxacin 250 mg Q48 tomm (12/1) to stop on Dec 9  E.Coli Pyelonephritis complicated by bacteremia -  Afebrile with improving leukocytosis. Pt with left sided flank pain and CVA tenderness with leukocytosis (20.9K) and UA on 11/25 with pyuria and hematuria and stranding about the left kidney concerning for pyelonephritis. Most likely due to obstructive left renal stone present on previous imaging that passed and caused inflammation and infection.    -Urine culture >100K E. Coli , pansensitive except to ampicillin  -Repeat UA 11/27 still with hematuria and pyuria, proteinuria improved  -Continue 14 day total course (Day 6/14) with Levofloxacin 250 mg Q48 -Continue to monitor CBC--> leukocytosis resolved (20.9K on admission to 7.8K on 11/29, now 8.2)  Hypoalbuminemia - stable. Pt with albumin of 3.2 on admission(11/25) that is downtrending to 2.6 on 11/28. Nephrotic syndrome possible considering renal failure and lower extremity edema, however no significant proteinuria (improved from admission). Liver function normal. Chronic malnutrition also possible. -Continue to monitor -Heart healthy diet -Nutrition consult --> no recommendations  -Obtain pre-albumin --> low (10L)  Mild chronic systolic Congestive Heart Failure - Last Echo 07/31/13 showed EF of 55% with LVH and moderate MVR. Per records he has history of compensated heart failure. Patient reported baseline dyspnea with exertion, orthopnea (two pillows), and chronic LE edema. Per chart review, his Lasix was increased in July 2014 to facilitate diuresis of 34 lbs, and his lasix was decreased to 40 daily in Oct. 2014. Wt gain of 11 lbs o admission. Pt received gentile IV hydration in setting of renal failure initially and then lasix was restarted on 11/30.  -Monitor daily weight --> 210 lb to 213lb since admission -Continue lasix 40  mg daily  -Home health RN for HF  A Fib with Asymptomatic PVCs - Currently in NSR. Patient was noted to have a new onset atrial fibrillation with a slow HR in July 2014. His cardiologist decreased the Metoprolol from 50 to 25 mg po daily. He is on Xarelto 15 Qday for anticoagulation.  - Monitor on telemetry   - Troponin negative x 1  - Hold Xarelto while GFR < 15-  - Continue ASA while Xarelto is on hold   Chronic thrombocytopenia - stable. Baseline 106-160. Last PLT was 160K in Oct. 2014. PLT 105K on admission.  - PT/APTT -->  INR 2.38 -Continue to monitor CBC  Hypertension - currently normotensive.  -Continue metoprolol succinate 24 mg daily  -Hold amlodipine-benazepril 5-20 mg daily in setting of AKI  Hyperlipidemia - Last lipid panel on 06/11/13 within normal limits. At home on simvastatin.  -Continue home Simvastatin 20 mg daily   BPH (benign prostatic hyperplasia)--stable, no indication of post urinary obstruction. Follows with urologist in [place].   -Continue home doxazosin 8 mg daily  -Continue home finasteride 5 mg daily   Iron Deficicency Anemia -  Asymptomatic, stable at baseline Hg of 9. Last anemia panel on 04/19/13 within normal limits. Ferritin on 10/21 within normal limits.  Most likely due to malabsorption in setting of colon resection. - Hold iron supplement in the acute setting of infection - Resume iron supplements upon discharge  -Obtain anemia panel---> low iron (26), low TIBC (204), low Iron sat (13%) --> most likely due to ACD due to CKD (not on outpatient Epo)    Malignant neoplasm of rectum, rectosigmoid junction, and anus -Stable, managed by his oncologist Dr. Truett Perna. There was no indication of metastatic versus compression lesions on abdomen CT scan on admission.  GERD - No reports of acid reflux symptoms -Hold home protonix 40 mg daily in setting of AKI   Diet: Heart DVT PPx: SCDs (Hold Heparin in the setting of thrombocytopenia. INR 2.38 ) Code:  Full     Dispo: Disposition is deferred at this time, awaiting improvement of current medical problems.  Anticipated discharge in approximately 1 day(s).   The patient does not have a current PCP Andrey Campanile Elizebeth Koller, MD) and does need an [facility] hospital follow-up appointment after discharge.  The patient does not have transportation limitations that hinder transportation to clinic appointments.  .Services Needed at time of discharge: Y = Yes, Blank = No PT:   OT:   RN:   Equipment:   Other:     LOS: 6 days   Vivi Barrack, MD 11/26/2013, 7:27 AM

## 2013-11-26 NOTE — Progress Notes (Signed)
INTERNAL MEDICINE ATTENDING NOTE Date: 11/26/2013  Patient name: Nicholas [name]  Medical record number: [id number]  Date of birth: [birth year]  Assessment: 79 year old man with BPH, CBF, AFib, rectal cancer hsitory admitted with left flank pain and hematuria and was found to have left pyleonephritis (E Coli) and sepsis, and acute kidney failure. He has a solitary kidney. He was treated with Imipenem (multiple allergies), and nephrology was consulted for AKI. However, levofloxacin has been reintroduced without difficulty yesterday. I have discussed this patient with my resident team. I have reviewed his medications, and blood work.  Patient feels much improved today. His exam is significant for no abdominal pain, colostomy bag intact on left lower abdominal wall, mild leg swelling and regular bradycardia.   Plan  Pyleopnephritis - He appears to have recovered, and does not have any fever, chills, rigors, or abdominal pain for now. Does not report discomfort on urinating. Currently on levofloxacin PO. Ok to discharge on this.   Sepsis - Cleared.   ARF on CKD - Stuck with creatinine around 5; its been 6 days now, patient clinically manifesting no symptoms. Baseline around 2 on 07/31/2013. Ok to discharge with nephrology follow up, only if nephrology is okay with this plan.     Other plan per resident note,   Jeraldine Loots 11/26/2013, 12:48 PM.

## 2013-11-26 NOTE — Progress Notes (Signed)
Occupational Therapy Treatment Patient Details Name: Nicholas [name] MRN: [id number] DOB: 1934-10-01 Today's Date: 11/26/2013 Time: 4098-1191 OT Time Calculation (min): 8 min  OT Assessment / Plan / Recommendation  History of present illness Patient is a 79 yo male admitted with pyelonephritis, acute on chronic kidney disease, anemia.  Patient with h/o colon CA with colostomy, MI, CHF, HTN.   OT comments  Pt and wife instructed in use of sock aid; however, pt reports that he would prefer to have wife assist him.  He is eager to discharge home.  THey have been instructed in acquisition of AE  Follow Up Recommendations  No OT follow up    Barriers to Discharge       Equipment Recommendations  None recommended by OT    Recommendations for Other Services    Frequency Min 2X/week   Progress towards OT Goals Progress towards OT goals: Progressing toward goals  Plan Discharge plan remains appropriate    Precautions / Restrictions Precautions Precautions: Fall   Pertinent Vitals/Pain     ADL  ADL Comments: Pt preparing for discharge.  Spoke with he and spouse re: interest in sock aid.  They both requested OT show them how to use it, as he has one at home, but does not know how to use it.  Pt and wife instructed how to use sock aid, but his foot is too wide for the standard sock aid and no extra wide sock aids in stock in main storage area.  Explained that I would quickly go check the other storage areas, but he is eager to discharge and states he likely would not use it if he had it.  He reports that wife has been helping him and would continue to do so.  Pt inquiring about LH sponge - instructed them where they could obtain one.     OT Diagnosis:    OT Problem List:   OT Treatment Interventions:     OT Goals(current goals can now be found in the care plan section) ADL Goals Pt Will Perform Upper Body Dressing: with modified independence;with adaptive equipment Pt Will Perform Lower  Body Dressing: with modified independence;with adaptive equipment;sit to/from stand  Visit Information  Last OT Received On: 11/26/13 History of Present Illness: Patient is a 79 yo male admitted with pyelonephritis, acute on chronic kidney disease, anemia.  Patient with h/o colon CA with colostomy, MI, CHF, HTN.    Subjective Data      Prior Functioning       Cognition  Cognition Arousal/Alertness: Awake/alert Behavior During Therapy: WFL for tasks assessed/performed Overall Cognitive Status: Within Functional Limits for tasks assessed    Mobility  Transfers Transfers: Sit to Stand;Stand to Sit Sit to Stand: 5: Supervision Stand to Sit: 5: Supervision    Exercises      Balance     End of Session OT - End of Session Activity Tolerance: Patient tolerated treatment well Patient left: in chair;with call bell/phone within reach;with nursing/sitter in room;with family/visitor present  GO     Hershall Benkert, Ursula Alert M 11/26/2013, 2:49 PM

## 2013-11-26 NOTE — Progress Notes (Signed)
Pt heart rate dropped in the night but didn't sustain. Dropped down to 29-30.  Pt was sleeping  When awaken heart rate elevates back up. Monitoring will continue.

## 2013-11-26 NOTE — Progress Notes (Signed)
Subjective: Says he is going home after lunch today Walked some today Feels like he is making larger quantities of urine since lasix started back  Objective Vital signs in last 24 hours: Filed Vitals:   11/25/13 1534 11/25/13 2056 11/26/13 0351 11/26/13 0353  BP: 132/52 153/50  138/54  Pulse: 64 55  51  Temp: 97.3 F (36.3 C) 98 F (36.7 C)  97.7 F (36.5 C)  TempSrc: Oral Oral  Oral  Resp: 16 18  16   Height:      Weight:   97 kg (213 lb 13.5 oz)   SpO2: 100% 99%  99%   Weight change: -1.7 kg (-3 lb 12 oz)  Intake/Output Summary (Last 24 hours) at 11/26/13 1049 Last data filed at 11/26/13 0857  Gross per 24 hour  Intake    120 ml  Output   1400 ml  Net  -1280 ml   Physical Exam:  Blood pressure 138/54, pulse 51, temperature 97.7 F (36.5 C), temperature source Oral, resp. rate 16, height 5\' 10"  (1.778 m), weight 97 kg (213 lb 13.5 oz), SpO2 99.00%. Bald pleasant WM  Couple scabbed over lesions on his head Lungs clear Colostomy bag with brown stool Abd not tender Bilateral LE edema 2+  Labs: Basic Metabolic Panel:  Recent Labs Lab 11/20/13 0804 11/20/13 1355  11/21/13 2016 11/21/13 2239 11/22/13 0500 11/23/13 0557 11/24/13 0605 11/25/13 0530 11/26/13 0507  NA 134*  --   < > 133* 131* 132* 136 139 138 139  K 4.7  --   < > 5.3* 4.5 4.4 4.6 4.5 4.8 5.0  CL 101  --   < > 102 100 100 104 106 105 107  CO2 22  --   < > 18* 18* 16* 20 19 21 23   GLUCOSE 90  --   < > 99 96 92 97 92 93 92  BUN 48*  --   < > 64* 65* 69* 79* 81* 78* 78*  CREATININE 4.73*  --   < > 5.28* 5.32* 5.39* 5.60* 5.52* 5.29* 5.33*  CALCIUM 9.5  --   < > 9.2 9.3 9.1 9.4 9.1 9.2 9.3  PHOS  --  3.0  --   --   --   --  4.3  --   --   --   < > = values in this interval not displayed. Liver Function Tests:  Recent Labs Lab 11/20/13 1355 11/23/13 0557 11/24/13 0605  AST 19  --  21  ALT 14  --  15  ALKPHOS 64  --  81  BILITOT 1.2  --  1.0  PROT 6.2  --  5.8*  ALBUMIN 3.2* 2.6* 2.5*     Recent Labs Lab 11/20/13 1500  LIPASE 14   No results found for this basename: AMMONIA,  in the last 168 hours CBC:  Recent Labs Lab 11/23/13 0557 11/24/13 0605 11/25/13 0530 11/26/13 0507  WBC 11.5* 7.8 7.5 8.2  NEUTROABS 9.7* 6.2 5.9 6.3  HGB 9.2* 9.3* 9.4* 9.4*  HCT 27.1* 26.8* 27.4* 27.2*  MCV 89.1 89.0 89.3 89.5  PLT 93* 97* 114* 118*   Recent Labs Lab 11/20/13 1150 11/20/13 1825 11/20/13 2330  TROPONINI <0.30 <0.30 <0.30    Recent Labs Lab 11/24/13 1134 11/24/13 1612 11/24/13 2141 11/25/13 0616 11/25/13 1114  GLUCAP 84 83 110* 91 102*     Recent Labs Lab 11/23/13 0557  IRON 26*  TIBC 204*  FERRITIN 260   Studies/Results: No results  found. Medications:   . amLODipine  5 mg Oral Daily  . aspirin EC  81 mg Oral Daily  . doxazosin  8 mg Oral QHS  . finasteride  5 mg Oral Daily  . furosemide  40 mg Oral Daily  . levofloxacin  250 mg Oral Q48H  . metoprolol succinate  25 mg Oral Daily  . simvastatin  20 mg Oral q1800  . sodium chloride  3 mL Intravenous Q12H    I  have reviewed scheduled and prn medications.  Assessment/ Plan:    1. Acute renal failure chronic kidney disease stage IV:  Likely from ATN (hypotension, recent nonsteroidal anti-inflammatory drug use in the face of ongoing ACE inhibitor/diuretic) versus AIN from pyelonephritis to what appears to be a solitary functioning kidney.  Currently, Non-oliguric  and renal recovery appears to be underway (creatnine is "stuck" at around 5.3 No acute electrolyte of volume concerns/emergent uremic symptoms to prompt consideration for dialysis. Home lasix was restarted yesterday  2. Pyelonephritis: Escherichia coli urinary tract infection however patient allergic or intolerant of several antibiotics-currently receiving levaquin.  Afebrile and without markers of sepsis at this time-leukocytosis notably better.   3. Metabolic acidosis: Most likely secondary to acute renal failure plus/minus  functional acidosis with chronic kidney disease. Improved with oral sodium bicarbonate - stable since bicarb stopped   4. Anemia:  Possibly anemia from chronic kidney disease-check iron stores and undertake replacement after infection controlled. No indications for PRBCs at this time.   With patient's tentative discharge today (per the pt), please note will need follow up labs with his primary care provider 2-3 days post hospital discharge. Nephrology followup is scheduled with Dr. Zetta Bills at [facility], [address] on12/29/14 at 3:15 PM   Camille Bal, MD [facility] [phone] pager 11/26/2013, 10:49 AM

## 2013-11-26 NOTE — Progress Notes (Signed)
Ambulated 300 ft in hallway using RW. Returned to bedside chair. Call bell near.Nicholas [name]

## 2013-11-26 NOTE — Progress Notes (Signed)
Physical Therapy Treatment Patient Details Name: Nicholas [name] MRN: [id number] DOB: Feb 05, 1934 Today's Date: 11/26/2013 Time: 0865-7846 PT Time Calculation (min): 25 min  PT Assessment / Plan / Recommendation  History of Present Illness Patient is a 79 yo male admitted with pyelonephritis, acute on chronic kidney disease, anemia.  Patient with h/o colon CA with colostomy, MI, CHF, HTN.   PT Comments   Pt progressing well and feels he is close to his baseline for mobility (just feels a bit weaker). Demonstrated continued gait deviations, however no loss of balance. Agrees to HHPT to assist with increasing balance and strength to prior status.   Follow Up Recommendations  Home health PT;Supervision - Intermittent     Does the patient have the potential to tolerate intense rehabilitation     Barriers to Discharge        Equipment Recommendations  None recommended by PT    Recommendations for Other Services    Frequency Min 3X/week   Progress towards PT Goals Progress towards PT goals: Progressing toward goals  Plan Current plan remains appropriate    Precautions / Restrictions Precautions Precautions: Fall Restrictions Weight Bearing Restrictions: No   Pertinent Vitals/Pain Chronic Lt shoulder pain from shoulder fx/replacement    Mobility  Bed Mobility Bed Mobility: Not assessed Transfers Transfers: Sit to Stand;Stand to Sit Sit to Stand: 4: Min guard;With upper extremity assist;With armrests;From chair/3-in-1;5: Supervision Stand to Sit: 4: Min guard;With armrests;To chair/3-in-1;5: Supervision;Without upper extremity assist Details for Transfer Assistance: x 4; Pt initially recalled to scoot to edge and get feet under him without cues, however as he fatigued, required cues; had pt stand to sit without UE support to incr workload on LEs Ambulation/Gait Ambulation/Gait Assistance: 4: Min guard Ambulation Distance (Feet): 220 Feet Assistive device: None Ambulation/Gait  Assistance Details: in room x 3 laps for practicing negotiating around obstacles without reaching/holding onto objects; no LOB; then ambulated in hall without UE support, again no LOB Gait Pattern: Step-through pattern;Decreased stride length;Shuffle;Lateral trunk lean to right;Lateral trunk lean to left;Trunk flexed Gait velocity: Slow gait speed. General Gait Details: one standing rest break due to dyspnea (2/4) Stairs: No (pt deferred; "not a problem")    Exercises General Exercises - Lower Extremity Hip Flexion/Marching: AROM;Both;10 reps;Standing;Other (comment) (light UE support on RW) Toe Raises: AROM;Both;10 reps;Standing;Other (comment) (light UE support on RW) Heel Raises: AROM;Both;10 reps;Standing;Other (comment) (light UE support on RW) Mini-Sqauts: AROM;Both;10 reps   PT Diagnosis:    PT Problem List:   PT Treatment Interventions:     PT Goals (current goals can now be found in the care plan section) Acute Rehab PT Goals Patient Stated Goal: To go home and be able to work in yard PT Goal Formulation: With patient/family Time For Goal Achievement: 12/01/13 Potential to Achieve Goals: Good  Visit Information  Last PT Received On: 11/26/13 Assistance Needed: +1 History of Present Illness: Patient is a 79 yo male admitted with pyelonephritis, acute on chronic kidney disease, anemia.  Patient with h/o colon CA with colostomy, MI, CHF, HTN.    Subjective Data  Subjective: I think they're going to let me go home today Patient Stated Goal: To go home and be able to work in yard   [employer] Arousal/Alertness: Awake/alert Behavior During Therapy: WFL for tasks assessed/performed (Agitated when wife states he has been having trouble walking) Overall Cognitive Status: Within Functional Limits for tasks assessed    Balance  Balance Balance Assessed: Yes Static Standing Balance Static Standing -  Balance Support: No upper extremity supported Static Standing -  Level of Assistance: 7: Independent Static Standing - Comment/# of Minutes: 1 minute Dynamic Standing Balance Dynamic Standing - Balance Support: No upper extremity supported Dynamic Standing - Level of Assistance: 5: Stand by assistance Dynamic Standing - Comments: mini-squats; forward reaching >6"; lateral weight shifting  End of Session PT - End of Session Equipment Utilized During Treatment: Gait belt Activity Tolerance: Patient tolerated treatment well Patient left: in chair;with call bell/phone within reach   GP     Aadi Bordner 11/26/2013, 10:31 AM Pager [phone]

## 2013-11-26 NOTE — Discharge Summary (Signed)
Name: Nicholas [name] MRN: [id number] DOB: 1934/02/04 79 y.o. PCP: [name], MD  Date of Admission: 11/20/2013  5:34 AM Date of Discharge: 11/26/2013 Attending Physician:   Discharge Diagnosis: 1. Acute left pyelonephritis complicated by bacteremia 2. Acute on chronic CKD 3. CHF 4. A Fib with Asymptomatic PVCs 5. Hypertension 6. Chronic anemia  Discharge Medications:   Medication List    STOP taking these medications       potassium chloride 20 MEQ packet  Commonly known as:  KLOR-CON     Rivaroxaban 15 MG Tabs tablet  Commonly known as:  XARELTO      TAKE these medications       amLODipine-benazepril 5-20 MG per capsule  Commonly known as:  LOTREL  Take 1 capsule by mouth daily.     aspirin 81 MG EC tablet  Take 1 tablet (81 mg total) by mouth daily.     B-100 COMPLEX Tabs  Take 1 tablet by mouth daily.     CALCIUM 600-D 600-400 MG-UNIT per tablet  Generic drug:  Calcium Carbonate-Vitamin D  Take 1 tablet by mouth 2 (two) times daily.     doxazosin 8 MG tablet  Commonly known as:  CARDURA  Take 8 mg by mouth at bedtime.     finasteride 5 MG tablet  Commonly known as:  PROSCAR  Take 5 mg by mouth daily.     furosemide 40 MG tablet  Commonly known as:  LASIX  Take 40 mg by mouth daily.     levofloxacin 250 MG tablet  Commonly known as:  LEVAQUIN  Take 1 tablet (250 mg total) by mouth every other day.     metoprolol succinate 25 MG 24 hr tablet  Commonly known as:  TOPROL-XL  Take 1 tablet (25 mg total) by mouth daily.     nitroGLYCERIN 0.4 MG SL tablet  Commonly known as:  NITROSTAT  Place 1 tablet (0.4 mg total) under the tongue every 5 (five) minutes as needed. For chest pain     pantoprazole 40 MG tablet  Commonly known as:  PROTONIX  Take 1 tablet (40 mg total) by mouth daily.     SAW PALMETTO PO  Take 1 capsule by mouth every evening.     simvastatin 20 MG tablet  Commonly known as:  ZOCOR  Take 1 tablet (20 mg total) by  mouth at bedtime.     vitamin C 500 MG tablet  Commonly known as:  ASCORBIC ACID  Take 500 mg by mouth daily.        Disposition and follow-up:   [name] was discharged from [facility] in Stable condition.  At the hospital follow up visit please address:  1.  Please assess renal function at follow up appointment. Please address anticoagulation for AF (cannot tolerate Xarelto with GFR<15).  2.  Labs / imaging needed at time of follow-up: BMP, CBC  3.  Pending labs/ test needing follow-up: None  Follow-up Appointments:     Follow-up Information   Follow up with [facility]. (home health physical therapy and nurse for congestive heart failure management)    Contact information:   [address] [place] [address] [phone]       Follow up with Dagoberto Ligas., MD On 12/24/2013. (@3 :15 PM. This is your kidney doctor.)    Specialty:  Nephrology   Contact information:   [address] [facility] [place] [address] [phone]  Follow up with [name], MD On 11/28/2013. (@11 :30am. This is your primary care doctor.)    Specialty:  Family Medicine   Contact information:   [address] [place] [address] [phone]       Discharge Instructions: Discharge Orders   Future Appointments Provider Department Dept Phone   12/14/2013 12:00 PM [facility] MEDICAL ONCOL[facility] [phone]   12/17/2013 12:30 PM Ladene Artist, MD [facility] MEDICAL ONCOLOGY [phone]   01/21/2014 8:30 AM [name], MD [facility] [phone]   Future Orders Complete By Expires   Diet - low sodium heart healthy  As directed    Increase activity slowly  As directed       Consultations: Treatment Team:  Hartley Barefoot. Allena Katz, MD  Procedures Performed:  Ct Abdomen Pelvis Wo Contrast  11/20/2013   CLINICAL DATA:  Right flank pain and hematuria.  History of rectal carcinoma.  EXAM: CT ABDOMEN AND PELVIS WITHOUT CONTRAST  TECHNIQUE: Multidetector CT imaging of the abdomen and pelvis was performed following the standard protocol without intravenous contrast.  COMPARISON:  CT chest, abdomen and pelvis 10/16/2012 and 10/18/2011.  FINDINGS: The patient has a small right pleural effusion. There is no left pleural effusion or pericardial effusion. Cardiomegaly is noted. Compressive atelectasis is seen in the right lung base with due to the patient's small effusion. Calcific coronary artery disease is identified. The patient is status post CABG.  The right kidney is atrophic. No renal or ureteral stones are identified and there is no hydronephrosis on the right or left. Two large left renal cysts are unchanged. There is stranding about the left kidney which is new since the prior CT scans.  The liver is low attenuating consistent with fatty infiltration. No focal liver lesion is seen on this uninfused examination. The gallbladder, spleen, adrenal glands and pancreas appear normal. Ventral and parastomal hernias containing loops of bowel are again identified. There is no evidence of bowel obstruction other complicating feature. Colonic diverticulosis without diverticulitis is seen. There is extensive aortoiliac atherosclerosis without aneurysm. No lymphadenopathy is identified. A small fluid collection anterior to the sacrum at the site of prior proctocolectomy and is again seen and smaller measuring 1.7 cm in diameter compared to 2.1 cm. No lytic or sclerotic bony lesion is identified.  IMPRESSION: The right kidney is atrophic but there is no hydronephrosis on the right or left and no urinary tract stones.  Stranding about the left kidney is new since the prior CT scanning and could be due to infectious or inflammatory process. Two large left renal cysts are unchanged.  No change in ventral and parastomal hernias containing loops of bowel without obstruction other  complicating feature.  Diverticulosis without diverticulitis.  Small right pleural effusion with associated compressive basilar atelectasis.  Cardiomegaly.  Extensive atherosclerosis.  Fatty infiltration of the liver.   Electronically Signed   By: Drusilla Kanner M.D.   On: 11/20/2013 07:26   X-ray Chest Pa And Lateral   11/20/2013   CLINICAL DATA:  Shortness of breath  EXAM: CHEST  2 VIEW  COMPARISON:  04/08/2013  FINDINGS: Cardiac shadow is again enlarged. Postsurgical changes are seen. Lungs are well aerated bilaterally with mild interstitial change. No focal confluent infiltrate is seen. Mild changes are noted projecting over the lower lobe on the lateral projection but less well visualized on the frontal film likely lying in the right lower lobe. No other focal abnormality is  noted.  IMPRESSION: Posterior lower lobe atelectasis likely on the right.   Electronically Signed   By: Alcide Clever M.D.   On: 11/20/2013 15:20   Admission HPI:  Patient is a 79 year old man with a PMH of BPH managed by an Urologist at [facility] , CHF/ICM, s/p CABG in 1993, A Fib since July 2014 managed by [facility] cardiology, Rectal cancer in 2008 with chronic guaiac positive stool managed by GI and Oncology, who presented with left flank pain and hematuria.   Patient states that he woke up from sudden onset left flank pain at 3 AM yesterday on 11/19/2013. He described that his pain was located at left flank area, sharp pain,10/10, constant, with no radiation. He states that his pain lasted about 12 hours and subsided spontaneously. He noticed decreased urine output yesterday evening and did not pay much attention to it until early this am when he noted that he "was peeing blood." Patient presented to the ED for further evaluation. He was noted to have worsening of renal function with Cr of 4.73, and evidences of Pyelonephritis. He was started on Imipenem due to multiple ABX allergies. The IMTS is called for admission.   Denies  previous history of kidney stone or kidney infections. Denies sick contacts. Denies injury or trauma to the back area. Denies urinary frequency, urgency or dysuria. Denies fever, chills, headaches, rhinorrhea, sneezing, sore throat, shortness breath, chest pain/pressure/palpitation, nausea, vomiting, abdominal pain or weakness.   Physical Exam:  Blood pressure 129/51, pulse 50, temperature 98.6 F (37 C), temperature source Oral, resp. rate 20, height 5\' 10"  (1.778 m), weight 214 lb 4.6 oz (97.2 kg), SpO2 99.00%.  General: acute sickness appearing Head: normocephalic and atraumatic.  Eyes: vision grossly intact, pupils equal, pupils round, pupils reactive to light, no injection and anicteric.  Mouth: pharynx pink and moist, no erythema, and no exudates.  Neck: supple, full ROM, no thyromegaly, no JVD, and no carotid bruits.  Lungs: normal respiratory effort, no accessory muscle use, normal breath sounds, no crackles, and no wheezes. Heart: irregularly irregular rhythm, no murmur, no gallop, and no rub.  Abdomen: soft, rounded, LLQ colostomy bag, non-tender, normal bowel sounds, no distention, no guarding, no rebound tenderness, no hepatomegaly, and no splenomegaly. Equivocal left CTA tenderness  Msk: no joint swelling, no joint warmth, and no redness over joints.  Pulses: 2+ DP/PT pulses bilaterally Extremities: No cyanosis, clubbing. B/L LE 3+ pitting edema right > Left.  Neurologic: alert & oriented X3, cranial nerves II-XII intact, strength normal in all extremities, sensation intact to light touch, and gait normal.  Skin: turgor normal and no rashes.  Psych: Oriented X3, memory intact for recent and remote, normally interactive, good eye contact, not anxious appearing, and not depressed appearing.  Hospital Course by problem list:  1. Acute left pyelonephritis complicated by bacteremia - Pt with left sided flank pain and CVA tenderness with leukocytosis (20.9K). UA on 11/25 with pyuria and  hematuria; there was stranding about the left kidney on CT concerning for pyelonephritis. Could have been due to obstructive left renal stone present on previous imaging that passed. Urine and blood cultures also positive for E. Coli, resistant to ampicillin & Unasyn. Patient with multiple antibiotic allergies, so was given Imipenem x6 days then tried on Levofloxacin 500 mg on 11/29, which he tolerated well. Per ID to continue for 14 day total course with renal dosing of Levofloxacin 250 mg Q48 (to stop on Dec 9). Repeat blood cultures s/p antibiotics were NGTD.  Afebrile and all vital signs stable at discharge.  2. Acute on chronic CKD - Baseline Cr 1.6-2.1. Cr 4.73 on admission, increased to a max of 5.6 on 11/28 then stablized at ~5.3 at discharge, which could represent new baseline. Pt was nonoliguric. Etiology of CKD unknown. Patient does have an atrophic kidney on the right per CT. FeUrea (32.2) indicating pre-renal azotemia. However there is most likely ATN component (BUN/Cr: 10.1, hypotension on admission, home ACEi, diuretics, NSAID) vs AIN from pyelonephritis (1 functioning kidney). No fever, rash, or peripheral eosinophilia to suggest AIN. Glomerulonephritis or nephrotic syndrome also possible due to hypoalbuminemia, however no casts, hypertension, or significant proteinuria or edema. Bladder scan on 11/26 with minimal PRV (0-22) so post-renal etiology unlikely, however pt with BPH that is medically managed. Pt on diuretic therapy for chronic LE edema, etiology unknown. Pt received IVF 50/h x 12 hr on 11/25 and 24mL/hr x 10 hr on 11/26 without improvement of renal function. No recent contrast. Does report using NSAIDs (naproxen) recently at home. We avoided nephrotoxins and resumed his Lasix per renal recommendations. I have discontinued his potassium supplementation at discharge, and he was counseled on a low potassium diet. He is scheduled for follow up with Dr. Allena Katz on 12/29 at [facility]. He  will need follow up labs with his primary care provider 2-3 days post hospital discharge; this appointment was made for 12/3.   3. CHF - Last Echo 07/31/13 showed EF of 55% with LVH and moderate MVR. Per records he has history of compensated heart failure. Patient reported baseline dyspnea with exertion, orthopnea (two pillows), and chronic LE edema. Per chart review, his Lasix was increased in July 2014 to facilitate diuresis of 34 lbs, and his lasix was decreased to 40 daily in Oct. 2014. Wt gain of 11 lbs on admission. Pt received gentile IV hydration in setting of renal failure initially and then lasix was restarted on 11/30. Weight stable at 213 lbs on discharge. Home health nursing was arranged to assist in management of his HF.  4. A Fib with Asymptomatic PVCs - NSR during admission. In July 2014, patient was noted to have a new onset paroxysmal atrial fibrillation with a slow HR. His cardiologist decreased his Metoprolol from 50 to 25 mg po daily. He was on Xarelto 15 Qday for anticoagulation on admission but this was held due to GFR<15. Daily ASA was started in the meantime for stroke prophylaxs. Troponin negative x 1. His anticoagulation will need to be further addressed as an outpatient.  5. Hypertension - We continued metoprolol succinate 24 mg daily but held amlodipine-benazepril 5-20 mg daily during admission in setting of AKI. Normotensive on discharge.  6. Chronic anemia - Asymptomatic, stable at baseline Hg of 9. Anemia panel showed low iron (26), low TIBC (204), low Iron sat (13%) (not on outpatient Epo). Most likely due to malabsorption in setting of colon resection. We held iron supplement in the acute setting of infection but this may be resumed on discharge.   Discharge Vitals:   BP 134/51  Pulse 51  Temp(Src) 97.7 F (36.5 C) (Oral)  Resp 16  Ht 5\' 10"  (1.778 m)  Wt 213 lb 13.5 oz (97 kg)  BMI 30.68 kg/m2  SpO2 99%  Discharge Labs:  Results for orders placed during the  hospital encounter of 11/20/13 (from the past 24 hour(s))  BASIC METABOLIC PANEL     Status: Abnormal   Collection Time    11/26/13  5:07 AM  Result Value Range   Sodium 139  135 - 145 mEq/L   Potassium 5.0  3.5 - 5.1 mEq/L   Chloride 107  96 - 112 mEq/L   CO2 23  19 - 32 mEq/L   Glucose, Bld 92  70 - 99 mg/dL   BUN 78 (*) 6 - 23 mg/dL   Creatinine, Ser 5.62 (*) 0.50 - 1.35 mg/dL   Calcium 9.3  8.4 - 13.0 mg/dL   GFR calc non Af Amer 9 (*) >90 mL/min   GFR calc Af Amer 11 (*) >90 mL/min  CBC WITH DIFFERENTIAL     Status: Abnormal   Collection Time    11/26/13  5:07 AM      Result Value Range   WBC 8.2  4.0 - 10.5 K/uL   RBC 3.04 (*) 4.22 - 5.81 MIL/uL   Hemoglobin 9.4 (*) 13.0 - 17.0 g/dL   HCT 86.5 (*) 78.4 - 69.6 %   MCV 89.5  78.0 - 100.0 fL   MCH 30.9  26.0 - 34.0 pg   MCHC 34.6  30.0 - 36.0 g/dL   RDW 29.5 (*) 28.4 - 13.2 %   Platelets 118 (*) 150 - 400 K/uL   Neutrophils Relative % 77  43 - 77 %   Neutro Abs 6.3  1.7 - 7.7 K/uL   Lymphocytes Relative 11 (*) 12 - 46 %   Lymphs Abs 0.9  0.7 - 4.0 K/uL   Monocytes Relative 8  3 - 12 %   Monocytes Absolute 0.7  0.1 - 1.0 K/uL   Eosinophils Relative 4  0 - 5 %   Eosinophils Absolute 0.3  0.0 - 0.7 K/uL   Basophils Relative 0  0 - 1 %   Basophils Absolute 0.0  0.0 - 0.1 K/uL    Signed: Vivi Barrack, MD 11/26/2013, 1:38 PM   Time Spent on Discharge: 30 minutes Services Ordered on Discharge: Home health PT and RN Equipment Ordered on Discharge: None

## 2013-11-28 NOTE — Discharge Summary (Signed)
   Date: 11/28/2013   Patient name: Nicholas [name]  MRN: [id number]  Date of birth: August 23, 1934  I evaluated the patient on the day of discharge and discussed the discharge plan with my resident team. I agree with the discharge documentation and disposition.     Aletta Edouard 11/28/2013, 12:55 PM

## 2013-11-29 ENCOUNTER — Telehealth: Payer: Self-pay | Admitting: *Deleted

## 2013-11-29 NOTE — Telephone Encounter (Signed)
Per [facility] Physical Therapist, pt has declined home health physical therapy.  RN spoke with [facility] RN, there are still home health nurse visits scheduled. Andree Coss, RN

## 2013-11-30 LAB — CULTURE, BLOOD (ROUTINE X 2): Culture: NO GROWTH

## 2013-12-05 ENCOUNTER — Telehealth: Payer: Self-pay | Admitting: *Deleted

## 2013-12-05 NOTE — Telephone Encounter (Signed)
Received via fax from [employer] regarding patients Cologuard test that not enough stool was obtained. I called [phone] and spoke with Tiffany. Per Tiffany they will call the patient and advise him that there was not enough stool collected to do test. Then per Tiffany if patient denies redoing test then they will send [place] a letter. Per Tiffany to retest is free to patient.

## 2013-12-14 ENCOUNTER — Other Ambulatory Visit (HOSPITAL_BASED_OUTPATIENT_CLINIC_OR_DEPARTMENT_OTHER): Payer: Medicare Other

## 2013-12-14 DIAGNOSIS — C20 Malignant neoplasm of rectum: Secondary | ICD-10-CM

## 2013-12-14 DIAGNOSIS — D649 Anemia, unspecified: Secondary | ICD-10-CM

## 2013-12-14 LAB — CBC WITH DIFFERENTIAL/PLATELET
Eosinophils Absolute: 0.2 10*3/uL (ref 0.0–0.5)
HCT: 27.8 % — ABNORMAL LOW (ref 38.4–49.9)
MCV: 96 fL (ref 79.3–98.0)
MONO#: 0.5 10*3/uL (ref 0.1–0.9)
MONO%: 8.4 % (ref 0.0–14.0)
NEUT#: 4.3 10*3/uL (ref 1.5–6.5)
RBC: 2.9 10*6/uL — ABNORMAL LOW (ref 4.20–5.82)
RDW: 18.3 % — ABNORMAL HIGH (ref 11.0–14.6)
WBC: 5.8 10*3/uL (ref 4.0–10.3)

## 2013-12-17 ENCOUNTER — Other Ambulatory Visit: Payer: Medicare Other | Admitting: Lab

## 2013-12-17 ENCOUNTER — Ambulatory Visit (HOSPITAL_BASED_OUTPATIENT_CLINIC_OR_DEPARTMENT_OTHER): Payer: Medicare Other | Admitting: Oncology

## 2013-12-17 ENCOUNTER — Telehealth: Payer: Self-pay | Admitting: Oncology

## 2013-12-17 VITALS — BP 133/51 | HR 55 | Temp 97.0°F | Resp 20 | Ht 70.0 in | Wt 208.5 lb

## 2013-12-17 DIAGNOSIS — D649 Anemia, unspecified: Secondary | ICD-10-CM

## 2013-12-17 DIAGNOSIS — D638 Anemia in other chronic diseases classified elsewhere: Secondary | ICD-10-CM

## 2013-12-17 DIAGNOSIS — N289 Disorder of kidney and ureter, unspecified: Secondary | ICD-10-CM

## 2013-12-17 DIAGNOSIS — C20 Malignant neoplasm of rectum: Secondary | ICD-10-CM

## 2013-12-17 DIAGNOSIS — N179 Acute kidney failure, unspecified: Secondary | ICD-10-CM

## 2013-12-17 DIAGNOSIS — N189 Chronic kidney disease, unspecified: Secondary | ICD-10-CM

## 2013-12-17 MED ORDER — EPOETIN ALFA 40000 UNIT/ML IJ SOLN
40000.0000 [IU] | Freq: Once | INTRAMUSCULAR | Status: AC
  Administered 2013-12-17: 40000 [IU] via SUBCUTANEOUS
  Filled 2013-12-17: qty 1

## 2013-12-17 NOTE — Telephone Encounter (Signed)
gv pt appt schedule for january °

## 2013-12-17 NOTE — Progress Notes (Signed)
Reviewed rationale for Procrit with patient and provided handout. He was able to verbalize reason for injection and frequency of injection and to take his iron twice daily using TEACH back method.

## 2013-12-17 NOTE — Progress Notes (Signed)
   [facility]    OFFICE PROGRESS NOTE   INTERVAL HISTORY:   Mr. Whang returns for a scheduled followup visit. He was admitted with pyelonephritis and bacteremia on 11/20/2013. He was seen by nephrology for evaluation of acute/chronic renal failure.  He reports having chronic leg edema. He has exertional dyspnea. Chronic bleeding at the skin surrounding the ostomy site. No other bleeding at present. He had hematuria last month.  Objective:  Vital signs in last 24 hours:  Blood pressure 133/51, pulse 55, temperature 97 F (36.1 C), temperature source Oral, resp. rate 20, height 5\' 10"  (1.778 m), weight 208 lb 8 oz (94.575 kg).    HEENT: Neck without mass Lymphatics: No cervical, supraclavicular, or inguinal nodes. Prominent bilateral axillary fat pads versus soft mobile 1 cm nodes. Resp: Lungs clear bilaterally Cardio: Irregular GI: No hepatomegaly, left lower quadrant colostomy, para stomal and midline hernias Vascular: Pitting edema below the knee bilaterally   Lab Results:  Lab Results  Component Value Date   WBC 5.8 12/14/2013   HGB 8.7* 12/14/2013   HCT 27.8* 12/14/2013   MCV 96.0 12/14/2013   PLT 130* 12/14/2013   ANC 4.3    Medications: I have reviewed the patient's current medications.  Assessment/Plan: 1. Rectal cancer-stage II, T3 N0, status post neoadjuvant infusional 5-fluorouracil and radiation, status post an APR 06/01/2007. He completed 4 months of adjuvant Xeloda.  2. Anemia-likely related to renal failure versus myelodysplasia. 3. Admission with pyelonephritis 11/20/2013 4. Acute/chronic renal failure    Disposition:  He has persistent anemia despite iron therapy. He has exertional dyspnea which may be in part related to anemia I suspect the anemia is related to renal failure. We discussed the risks/benefits of Procrit today. I reviewed the risk of thromboembolic disease, allergic reaction, and adverse cancer outcome with him. He  agrees to proceed with a trial of Procrit. The goal hemoglobin will be 10-11. He will start Procrit on a 2 week schedule today.  He remains in clinical remission from rectal cancer.   Thornton Papas, MD  12/17/2013  12:21 PM

## 2013-12-17 NOTE — Patient Instructions (Signed)
Your anemia is more likely related to your reduced kidney function.  We will begin Procrit 40,000 units SQ every 2 weeks (started today) + you may decrease your iron tablet to twice daily (helps make the Procrit work better) Epoetin Alfa injection What is this medicine? EPOETIN ALFA (e POE e tin AL fa) helps your body make more red blood cells. This medicine is used to treat anemia caused by chronic kidney failure, cancer chemotherapy, or HIV-therapy. It may also be used before surgery if you have anemia. This medicine may be used for other purposes; ask your health care provider or pharmacist if you have questions. COMMON BRAND NAME(S): Epogen, Procrit What should I tell my health care provider before I take this medicine? They need to know if you have any of these conditions: -blood clotting disorders -cancer patient not on chemotherapy -cystic fibrosis -heart disease, such as angina or heart failure -hemoglobin level of 12 g/dL or greater -high blood pressure -low levels of folate, iron, or vitamin B12 -seizures -an unusual or allergic reaction to erythropoietin, albumin, benzyl alcohol, hamster proteins, other medicines, foods, dyes, or preservatives -pregnant or trying to get pregnant -breast-feeding How should I use this medicine? This medicine is for injection into a vein or under the skin. It is usually given by a health care professional in a hospital or clinic setting. If you get this medicine at home, you will be taught how to prepare and give this medicine. Use exactly as directed. Take your medicine at regular intervals. Do not take your medicine more often than directed. It is important that you put your used needles and syringes in a special sharps container. Do not put them in a trash can. If you do not have a sharps container, call your pharmacist or healthcare provider to get one. Talk to your pediatrician regarding the use of this medicine in children. While this drug may  be prescribed for selected conditions, precautions do apply. Overdosage: If you think you have taken too much of this medicine contact a poison control center or emergency room at once. NOTE: This medicine is only for you. Do not share this medicine with others. What if I miss a dose? If you miss a dose, take it as soon as you can. If it is almost time for your next dose, take only that dose. Do not take double or extra doses. What may interact with this medicine? Do not take this medicine with any of the following medications: -darbepoetin alfa This list may not describe all possible interactions. Give your health care provider a list of all the medicines, herbs, non-prescription drugs, or dietary supplements you use. Also tell them if you smoke, drink alcohol, or use illegal drugs. Some items may interact with your medicine. What should I watch for while using this medicine? Visit your prescriber or health care professional for regular checks on your progress and for the needed blood tests and blood pressure measurements. It is especially important for the doctor to make sure your hemoglobin level is in the desired range, to limit the risk of potential side effects and to give you the best benefit. Keep all appointments for any recommended tests. Check your blood pressure as directed. Ask your doctor what your blood pressure should be and when you should contact him or her. As your body makes more red blood cells, you may need to take iron, folic acid, or vitamin B supplements. Ask your doctor or health care provider which products are  right for you. If you have kidney disease continue dietary restrictions, even though this medication can make you feel better. Talk with your doctor or health care professional about the foods you eat and the vitamins that you take. What side effects may I notice from receiving this medicine? Side effects that you should report to your doctor or health care professional  as soon as possible: -allergic reactions like skin rash, itching or hives, swelling of the face, lips, or tongue -breathing problems -changes in vision -chest pain -confusion, trouble speaking or understanding -feeling faint or lightheaded, falls -high blood pressure -muscle aches or pains -pain, swelling, warmth in the leg -rapid weight gain -severe headaches -sudden numbness or weakness of the face, arm or leg -trouble walking, dizziness, loss of balance or coordination -seizures (convulsions) -swelling of the ankles, feet, hands -unusually weak or tired Side effects that usually do not require medical attention (report to your doctor or health care professional if they continue or are bothersome): -diarrhea -fever, chills (flu-like symptoms) -headaches -nausea, vomiting -redness, stinging, or swelling at site where injected This list may not describe all possible side effects. Call your doctor for medical advice about side effects. You may report side effects to FDA at 1-800-FDA-1088. Where should I keep my medicine? Keep out of the reach of children. Store in a refrigerator between 2 and 8 degrees C (36 and 46 degrees F). Do not freeze or shake. Throw away any unused portion if using a single-dose vial. Multi-dose vials can be kept in the refrigerator for up to 21 days after the initial dose. Throw away unused medicine. NOTE: This sheet is a summary. It may not cover all possible information. If you have questions about this medicine, talk to your doctor, pharmacist, or health care provider.  2014, Elsevier/Gold Standard. (2008-11-26 10:25:44)

## 2013-12-21 ENCOUNTER — Encounter: Payer: Self-pay | Admitting: Gastroenterology

## 2013-12-31 ENCOUNTER — Ambulatory Visit (HOSPITAL_BASED_OUTPATIENT_CLINIC_OR_DEPARTMENT_OTHER): Payer: Medicare Other

## 2013-12-31 ENCOUNTER — Other Ambulatory Visit (HOSPITAL_BASED_OUTPATIENT_CLINIC_OR_DEPARTMENT_OTHER): Payer: Medicare Other

## 2013-12-31 VITALS — BP 108/35 | HR 56 | Temp 97.8°F

## 2013-12-31 DIAGNOSIS — D638 Anemia in other chronic diseases classified elsewhere: Secondary | ICD-10-CM

## 2013-12-31 DIAGNOSIS — N289 Disorder of kidney and ureter, unspecified: Secondary | ICD-10-CM

## 2013-12-31 DIAGNOSIS — N179 Acute kidney failure, unspecified: Secondary | ICD-10-CM

## 2013-12-31 DIAGNOSIS — D649 Anemia, unspecified: Secondary | ICD-10-CM

## 2013-12-31 DIAGNOSIS — C20 Malignant neoplasm of rectum: Secondary | ICD-10-CM

## 2013-12-31 DIAGNOSIS — N189 Chronic kidney disease, unspecified: Secondary | ICD-10-CM

## 2013-12-31 LAB — CBC WITH DIFFERENTIAL/PLATELET
BASO%: 0.7 % (ref 0.0–2.0)
Basophils Absolute: 0.1 10*3/uL (ref 0.0–0.1)
EOS%: 3.7 % (ref 0.0–7.0)
Eosinophils Absolute: 0.3 10*3/uL (ref 0.0–0.5)
HCT: 29.7 % — ABNORMAL LOW (ref 38.4–49.9)
HGB: 9.7 g/dL — ABNORMAL LOW (ref 13.0–17.1)
LYMPH#: 0.8 10*3/uL — AB (ref 0.9–3.3)
LYMPH%: 11.1 % — ABNORMAL LOW (ref 14.0–49.0)
MCH: 31.8 pg (ref 27.2–33.4)
MCHC: 32.6 g/dL (ref 32.0–36.0)
MCV: 97.6 fL (ref 79.3–98.0)
MONO#: 0.5 10*3/uL (ref 0.1–0.9)
MONO%: 6.5 % (ref 0.0–14.0)
NEUT#: 5.7 10*3/uL (ref 1.5–6.5)
NEUT%: 78 % — AB (ref 39.0–75.0)
Platelets: 104 10*3/uL — ABNORMAL LOW (ref 140–400)
RBC: 3.05 10*6/uL — ABNORMAL LOW (ref 4.20–5.82)
RDW: 17.5 % — ABNORMAL HIGH (ref 11.0–14.6)
WBC: 7.4 10*3/uL (ref 4.0–10.3)

## 2013-12-31 MED ORDER — EPOETIN ALFA 20000 UNIT/ML IJ SOLN
40000.0000 [IU] | Freq: Once | INTRAMUSCULAR | Status: AC
  Administered 2013-12-31: 40000 [IU] via SUBCUTANEOUS
  Filled 2013-12-31: qty 2

## 2014-01-03 NOTE — Telephone Encounter (Signed)
Per Dr Baxter Flattery this is not her patient will forward to correct doctor.

## 2014-01-14 ENCOUNTER — Ambulatory Visit (HOSPITAL_BASED_OUTPATIENT_CLINIC_OR_DEPARTMENT_OTHER): Payer: Medicare Other | Admitting: Nurse Practitioner

## 2014-01-14 ENCOUNTER — Other Ambulatory Visit (HOSPITAL_BASED_OUTPATIENT_CLINIC_OR_DEPARTMENT_OTHER): Payer: Medicare Other

## 2014-01-14 ENCOUNTER — Ambulatory Visit (HOSPITAL_BASED_OUTPATIENT_CLINIC_OR_DEPARTMENT_OTHER): Payer: Medicare Other

## 2014-01-14 ENCOUNTER — Telehealth: Payer: Self-pay | Admitting: Oncology

## 2014-01-14 VITALS — BP 144/62 | Temp 96.9°F | Resp 20 | Ht 70.0 in | Wt 206.7 lb

## 2014-01-14 DIAGNOSIS — N289 Disorder of kidney and ureter, unspecified: Secondary | ICD-10-CM

## 2014-01-14 DIAGNOSIS — N189 Chronic kidney disease, unspecified: Secondary | ICD-10-CM

## 2014-01-14 DIAGNOSIS — N179 Acute kidney failure, unspecified: Secondary | ICD-10-CM

## 2014-01-14 DIAGNOSIS — C20 Malignant neoplasm of rectum: Secondary | ICD-10-CM

## 2014-01-14 DIAGNOSIS — D649 Anemia, unspecified: Secondary | ICD-10-CM

## 2014-01-14 LAB — CBC WITH DIFFERENTIAL/PLATELET
BASO%: 1.2 % (ref 0.0–2.0)
BASOS ABS: 0.1 10*3/uL (ref 0.0–0.1)
EOS%: 4.1 % (ref 0.0–7.0)
Eosinophils Absolute: 0.3 10*3/uL (ref 0.0–0.5)
HCT: 33.3 % — ABNORMAL LOW (ref 38.4–49.9)
HEMOGLOBIN: 10.7 g/dL — AB (ref 13.0–17.1)
LYMPH#: 1.2 10*3/uL (ref 0.9–3.3)
LYMPH%: 16.9 % (ref 14.0–49.0)
MCH: 31.6 pg (ref 27.2–33.4)
MCHC: 32 g/dL (ref 32.0–36.0)
MCV: 98.7 fL — ABNORMAL HIGH (ref 79.3–98.0)
MONO#: 0.5 10*3/uL (ref 0.1–0.9)
MONO%: 7.5 % (ref 0.0–14.0)
NEUT#: 4.8 10*3/uL (ref 1.5–6.5)
NEUT%: 70.3 % (ref 39.0–75.0)
Platelets: 112 10*3/uL — ABNORMAL LOW (ref 140–400)
RBC: 3.37 10*6/uL — AB (ref 4.20–5.82)
RDW: 17.4 % — AB (ref 11.0–14.6)
WBC: 6.8 10*3/uL (ref 4.0–10.3)

## 2014-01-14 MED ORDER — EPOETIN ALFA 20000 UNIT/ML IJ SOLN
40000.0000 [IU] | Freq: Once | INTRAMUSCULAR | Status: AC
  Administered 2014-01-14: 40000 [IU] via SUBCUTANEOUS
  Filled 2014-01-14: qty 2

## 2014-01-14 MED ORDER — EPOETIN ALFA 20000 UNIT/ML IJ SOLN: 40000.0000 [IU] | Freq: Once | INTRAMUSCULAR | Status: DC

## 2014-01-14 NOTE — Progress Notes (Signed)
OFFICE PROGRESS NOTE  Interval history:  Mr. Gauthreaux returns for followup of anemia. He received Procrit injections on 12/17/2013 and 12/31/2013. He notes improvement in his energy level. Dyspnea on exertion is also better. He has noted mild nausea 2-3 days after each injection. No vomiting. He wonders if the nausea is related to the injection. He has stable chronic leg swelling that improves overnight. No further episodes of hematuria. No other bleeding.   Objective: Filed Vitals:   01/14/14 1502  BP: 144/62  Temp: 96.9 F (36.1 C)  Resp: 20   Oropharynx is without thrush or ulceration. Lungs are clear. Irregular cardiac rhythm. Abdomen is soft. No hepatomegaly. Left lower quadrant colostomy. Pitting edema below the knees bilaterally.   Lab Results: Lab Results  Component Value Date   WBC 6.8 01/14/2014   HGB 10.7* 01/14/2014   HCT 33.3* 01/14/2014   MCV 98.7* 01/14/2014   PLT 112* 01/14/2014   NEUTROABS 4.8 01/14/2014    Chemistry:    Chemistry      Component Value Date/Time   NA 139 11/26/2013 0507   NA 140 10/13/2012 0901   K 5.0 11/26/2013 0507   K 4.7 10/13/2012 0901   CL 107 11/26/2013 0507   CL 105 10/13/2012 0901   CO2 23 11/26/2013 0507   CO2 26 10/13/2012 0901   BUN 78* 11/26/2013 0507   BUN 21.0 10/13/2012 0901   CREATININE 5.33* 11/26/2013 0507   CREATININE 1.7* 10/13/2012 0901      Component Value Date/Time   CALCIUM 9.3 11/26/2013 0507   CALCIUM 10.1 10/13/2012 0901   ALKPHOS 81 11/24/2013 0605   ALKPHOS 68 10/13/2012 0901   AST 21 11/24/2013 0605   AST 18 10/13/2012 0901   ALT 15 11/24/2013 0605   ALT 13 10/13/2012 0901   BILITOT 1.0 11/24/2013 0605   BILITOT 0.80 10/13/2012 0901       Studies/Results: No results found.  Medications: I have reviewed the patient's current medications.  Assessment/Plan: 1. Rectal cancer, stage II, T3 N0, status post neoadjuvant infusional 5-fluorouracil and radiation, status post APR 06/01/2007. He completed 4 months  of adjuvant Xeloda. 2. Anemia. Likely related to renal failure versus myelodysplasia. Trial of erythropoietin initiated 12/17/2013. 3. Admission with pyelonephritis 11/20/2013. 4. Chronic renal failure.   Dispositon-his hemoglobin is better since initiation of Procrit. He will receive a Procrit injection today. Schedule will be adjusted thereafter to every 3 weeks. We will see him back in 6 weeks for followup.  Plan reviewed with Dr. Benay Spice.   Ned Card ANP/GNP-BC

## 2014-01-14 NOTE — Addendum Note (Signed)
Addended by: Robet Leu on: 01/14/2014 03:37 PM   Modules accepted: Orders

## 2014-01-14 NOTE — Telephone Encounter (Signed)
gv pt appt schedule for feb/march °

## 2014-01-21 ENCOUNTER — Ambulatory Visit (INDEPENDENT_AMBULATORY_CARE_PROVIDER_SITE_OTHER): Payer: Medicare Other | Admitting: Cardiology

## 2014-01-21 ENCOUNTER — Other Ambulatory Visit (INDEPENDENT_AMBULATORY_CARE_PROVIDER_SITE_OTHER): Payer: Medicare Other

## 2014-01-21 ENCOUNTER — Encounter: Payer: Self-pay | Admitting: Cardiology

## 2014-01-21 VITALS — BP 128/42 | HR 48 | Ht 68.0 in | Wt 204.0 lb

## 2014-01-21 DIAGNOSIS — I4891 Unspecified atrial fibrillation: Secondary | ICD-10-CM

## 2014-01-21 DIAGNOSIS — I493 Ventricular premature depolarization: Secondary | ICD-10-CM

## 2014-01-21 DIAGNOSIS — K219 Gastro-esophageal reflux disease without esophagitis: Secondary | ICD-10-CM

## 2014-01-21 DIAGNOSIS — E7800 Pure hypercholesterolemia, unspecified: Secondary | ICD-10-CM

## 2014-01-21 DIAGNOSIS — I2510 Atherosclerotic heart disease of native coronary artery without angina pectoris: Secondary | ICD-10-CM

## 2014-01-21 DIAGNOSIS — N289 Disorder of kidney and ureter, unspecified: Secondary | ICD-10-CM

## 2014-01-21 DIAGNOSIS — I4949 Other premature depolarization: Secondary | ICD-10-CM

## 2014-01-21 LAB — BASIC METABOLIC PANEL
BUN: 32 mg/dL — AB (ref 6–23)
CO2: 29 mEq/L (ref 19–32)
CREATININE: 3.9 mg/dL — AB (ref 0.4–1.5)
Calcium: 9.8 mg/dL (ref 8.4–10.5)
Chloride: 105 mEq/L (ref 96–112)
GFR: 16.08 mL/min — AB (ref >60.00)
GLUCOSE: 97 mg/dL (ref 70–99)
POTASSIUM: 3 meq/L — AB (ref 3.5–5.1)
Sodium: 142 mEq/L (ref 135–145)

## 2014-01-21 LAB — CBC WITH DIFFERENTIAL/PLATELET
BASOS ABS: 0 10*3/uL (ref 0.0–0.1)
Basophils Relative: 0.4 % (ref 0.0–3.0)
Eosinophils Absolute: 0.3 10*3/uL (ref 0.0–0.7)
Eosinophils Relative: 3.6 % (ref 0.0–5.0)
HEMATOCRIT: 34.1 % — AB (ref 39.0–52.0)
Hemoglobin: 11.1 g/dL — ABNORMAL LOW (ref 13.0–17.0)
Lymphocytes Relative: 13.5 % (ref 12.0–46.0)
Lymphs Abs: 1 10*3/uL (ref 0.7–4.0)
MCHC: 32.5 g/dL (ref 30.0–36.0)
MCV: 98.1 fl (ref 78.0–100.0)
MONOS PCT: 8.6 % (ref 3.0–12.0)
Monocytes Absolute: 0.6 10*3/uL (ref 0.1–1.0)
NEUTROS PCT: 73.9 % (ref 43.0–77.0)
Neutro Abs: 5.4 10*3/uL (ref 1.4–7.7)
PLATELETS: 135 10*3/uL — AB (ref 150.0–400.0)
RBC: 3.48 Mil/uL — ABNORMAL LOW (ref 4.22–5.81)
RDW: 18.2 % — AB (ref 11.5–14.6)
WBC: 7.3 10*3/uL (ref 4.5–10.5)

## 2014-01-21 NOTE — Progress Notes (Signed)
Quick Note:  Please report to patient. The recent labs are stable. Continue same medication and careful diet.Kidney function is better. Now potassium 3.0 is too low. Add KDur 20 meq one daily. Continue lasix. hgb is better. ______

## 2014-01-21 NOTE — Assessment & Plan Note (Signed)
The patient has chronic established atrial fibrillation.  EKG today shows atrial fibrillation with frequent PVCs and a very slow ventricular response.  We are decreasing his Toprol to just 12.5 mg daily.  Ideally he should be on oral anticoagulation for his atrial fibrillation.  We will see what his current GFR is.  If it has improved we will restart Xarelto in appropriate dosage.

## 2014-01-21 NOTE — Patient Instructions (Signed)
Will obtain labs today and call you with the results (bmet/cbc)  DECREASE YOUR TOPROL TO 1/2 TABLET (12.5 MG) DAILY   Your physician recommends that you schedule a follow-up appointment in: 2 MONTH OV/EKG/CBC/BMET

## 2014-01-21 NOTE — Assessment & Plan Note (Signed)
The patient has not been having any recent active symptoms of indigestion or GERD.

## 2014-01-21 NOTE — Progress Notes (Signed)
Warden Fillers Date of Birth:  05-Dec-1934 [address] [place], [place]  [address] [phone]         Fax   [phone]  History of Present Illness: This pleasant 80 year old gentleman is seen for a followup office visit. He has a past history of ischemic heart disease and had coronary artery bypass graft surgery on 05/27/92. He has a past history of compensated congestive heart failure. He had a nuclear stress test in June 2010 showing an old inferior scar but no reversible ischemia and his ejection fraction was 51%. Recently he has had a problem with weight gain and increasing peripheral edema. He was seen as a work in by Dr. Ron [place] in early July 2014 and prior to that had been seen by Dr. Arelia Sneddon because of increasing peripheral edema. Dr. Arelia Sneddon did a chest x-ray which the patient was told was okay. Dr. Ron [place] had him increase his Lasix from 20 mg up to 40 mg daily.   When Dr. Ron [place] saw him his EKG showed atrial fibrillation which was new since 2013.  His most recent echocardiogram on 07/31/13 showed normal systolic function with ejection fraction 55% and there was inferior wall akinesis.  There was biatrial enlargement.  There was mild to moderate mitral regurgitation and his pulmonary artery pressure was elevated at 46-50 mm mercury.  His last visit he has diuresed well and has lost 28 pounds.  He had been on 80 mg of Lasix initially and is now back to 40 mg daily.  He has had some problems with iron deficiency anemia and has seen Dr. Verl Blalock.  He is taking oral iron therapy. Since last visit he was hospitalized over Thanksgiving 2014 with acute renal failure.  Because his GFR was less than 15 he was no longer able to take Xarelto and he was placed on just a baby aspirin for his atrial fibrillation.  He was found to have a nonfunctioning right kidney and he had a history of passage of a kidney stone with hematuria.  His last creatinine was 5.3 at discharge from the hospital.  He  has not been aware of any further gross hematuria and is not having any hematochezia.   Current Outpatient Prescriptions  Medication Sig Dispense Refill  . amLODipine-benazepril (LOTREL) 5-20 MG per capsule Take 1 capsule by mouth daily.  30 capsule  11  . aspirin EC 81 MG EC tablet Take 1 tablet (81 mg total) by mouth daily.  30 tablet  3  . Calcium Carbonate-Vitamin D (CALCIUM 600-D) 600-400 MG-UNIT per tablet Take 1 tablet by mouth 2 (two) times daily.       Marland Kitchen doxazosin (CARDURA) 8 MG tablet Take 8 mg by mouth at bedtime.       . ferrous sulfate 325 (65 FE) MG tablet Take 325 mg by mouth 2 (two) times daily with a meal.       . finasteride (PROSCAR) 5 MG tablet Take 5 mg by mouth daily.        . furosemide (LASIX) 40 MG tablet Take 40 mg by mouth daily.      . metoprolol succinate (TOPROL-XL) 25 MG 24 hr tablet Take 12.5 mg by mouth daily.      . nitroGLYCERIN (NITROSTAT) 0.4 MG SL tablet Place 1 tablet (0.4 mg total) under the tongue every 5 (five) minutes as needed. For chest pain  25 tablet  6  . pantoprazole (PROTONIX) 40 MG tablet Take 1 tablet (40 mg  total) by mouth daily.  30 tablet  11  . PREVNAR 13 SUSP injection once.      . Saw Palmetto, Serenoa repens, (SAW PALMETTO PO) Take 1 capsule by mouth every evening.      . simvastatin (ZOCOR) 20 MG tablet Take 1 tablet (20 mg total) by mouth at bedtime.  90 tablet  3  . vitamin C (ASCORBIC ACID) 500 MG tablet Take 500 mg by mouth daily.        . Vitamins-Lipotropics (B-100 COMPLEX) TABS Take 1 tablet by mouth daily.        No current facility-administered medications for this visit.    Allergies  Allergen Reactions  . Cephalexin Other (See Comments)    "too strong for my system" - per office note 05/05/2012  . Ciprofloxacin Other (See Comments)    Reaction unknown  . Hctz [Hydrochlorothiazide]     DIZZINESS   . Levofloxacin Other (See Comments)    "too strong for my system" - per office note 05/05/2012  . Lipitor [Atorvastatin  Calcium]     MUSCLE PAIN  . Niaspan [Niacin Er]     FLUSHING   . Oxycodone-Acetaminophen Other (See Comments)    Reaction unknown  . Sulfonamide Derivatives Other (See Comments)    Reaction unkonwn    Patient Active Problem List   Diagnosis Date Noted  . Hx of CABG 05/27/2011    Priority: High  . Asymptomatic PVCs 05/27/2011    Priority: High  . HYPERTENSION 05/08/2009    Priority: High  . Pyelonephritis, acute 11/20/2013  . Acute on chronic kidney failure 11/20/2013  . Pyelonephritis 11/20/2013  . Atrial fibrillation 07/10/2013  . Volume overload 07/10/2013  . Coronary artery disease   . Ejection fraction   . Renal insufficiency 05/05/2012  . BPH (benign prostatic hyperplasia) 09/21/2011  . Personal history of malignant neoplasm of rectum, rectosigmoid junction, and anus 05/14/2011  . GERD (gastroesophageal reflux disease) 05/14/2011  . Hernia of abdominal cavity 05/14/2011  . CARCINOMA, RECTUM 05/08/2009  . HYPERCHOLESTEROLEMIA 05/08/2009  . ANEMIA, IRON DEFICIENCY 05/08/2009  . MYOCARDIAL INFARCTION 05/08/2009  . GERD 05/08/2009  . DIVERTICULOSIS, COLON 05/08/2009  . DEGENERATIVE JOINT DISEASE, GENERALIZED 05/08/2009  . COLONIC POLYPS, ADENOMATOUS, HX OF 05/08/2009    History  Smoking status  . Former Smoker -- 10 years  . Types: Pipe, Cigars  . Quit date: 05/26/1991  Smokeless tobacco  . Current User  . Types: Chew    History  Alcohol Use No    Family History  Problem Relation Age of Onset  . Pancreatic cancer Father   . Diabetes Son   . Emphysema Mother   . Anesthesia problems Neg Hx   . Hypotension Neg Hx   . Malignant hyperthermia Neg Hx   . Pseudochol deficiency Neg Hx     Review of Systems: Constitutional: no fever chills diaphoresis or fatigue or change in weight.  Head and neck: no hearing loss, no epistaxis, no photophobia or visual disturbance. Respiratory: No cough, shortness of breath or wheezing. Cardiovascular: No chest pain  peripheral edema, palpitations. Gastrointestinal: No abdominal distention, no abdominal pain, no change in bowel habits hematochezia or melena. Genitourinary: No dysuria, no frequency, no urgency, no nocturia. Musculoskeletal:No arthralgias, no back pain, no gait disturbance or myalgias. Neurological: No dizziness, no headaches, no numbness, no seizures, no syncope, no weakness, no tremors. Hematologic: No lymphadenopathy, no easy bruising. Psychiatric: No confusion, no hallucinations, no sleep disturbance.    Physical Exam: Filed Vitals:  01/21/14 0800  BP: 128/42  Pulse: 48   the general appearance reveals a large gentleman in no acute distress.  His weight is down 6 pounds.The head and neck exam reveals pupils equal and reactive.  Extraocular movements are full.  There is no scleral icterus.  The mouth and pharynx are normal.  The neck is supple.  The carotids reveal no bruits.  The jugular venous pressure is normal.  The  thyroid is not enlarged.  There is no lymphadenopathy.  The chest is clear to percussion and auscultation.  There are no rales or rhonchi.  Expansion of the chest is symmetrical.  The precordium is quiet.  The pulse is irregularly irregular in slow atrial fibrillation The first heart sound is normal.  The second heart sound is physiologically split.  There is no murmur gallop rub or click.  There is no abnormal lift or heave.  The abdomen is soft and nontender.  The bowel sounds are normal.  The liver and spleen are not enlarged.  There are no abdominal masses.  There are no abdominal bruits.  Extremities reveal good pedal pulses.  There is severe dependent bilateral peripheral edema which is chronic but which goes down at night.  There is no cyanosis or clubbing.  Strength is normal and symmetrical in all extremities.  There is no lateralizing weakness.  There are no sensory deficits.  The skin is warm and dry.  There is no rash.  EKG today shows atrial fibrillation slow  ventricular response and with frequent PVCs.  Assessment / Plan: The patient will decrease his Toprol to just 12.5 mg daily to allow a higher heart rate.  We're checking a CBC and a BMET today. Consider restart of Xarelto.  Otherwise he may need to restart warfarin. Recheck in 2 months for office visit EKG basal metabolic panel and CBC.

## 2014-01-21 NOTE — Assessment & Plan Note (Signed)
The patient has not been having any recurrent chest pain or angina. 

## 2014-01-23 ENCOUNTER — Telehealth: Payer: Self-pay | Admitting: *Deleted

## 2014-01-23 DIAGNOSIS — E876 Hypokalemia: Secondary | ICD-10-CM

## 2014-01-23 MED ORDER — POTASSIUM CHLORIDE CRYS ER 20 MEQ PO TBCR: 20.0000 meq | EXTENDED_RELEASE_TABLET | Freq: Every day | ORAL | Status: DC

## 2014-01-23 NOTE — Telephone Encounter (Signed)
Advised patient of lab results  

## 2014-01-23 NOTE — Telephone Encounter (Signed)
Message copied by Earvin Hansen on Wed Jan 23, 2014  3:59 PM ------      Message from: Darlin Coco      Created: Mon Jan 21, 2014  9:29 PM       Please report to patient.  The recent labs are stable. Continue same medication and careful diet.Kidney function is better. Now potassium 3.0 is too low. Add KDur 20 meq one daily. Continue lasix.   hgb is better. ------

## 2014-02-04 ENCOUNTER — Other Ambulatory Visit (HOSPITAL_BASED_OUTPATIENT_CLINIC_OR_DEPARTMENT_OTHER): Payer: Medicare Other

## 2014-02-04 ENCOUNTER — Other Ambulatory Visit: Payer: Self-pay | Admitting: Oncology

## 2014-02-04 ENCOUNTER — Ambulatory Visit: Payer: Medicare Other

## 2014-02-04 DIAGNOSIS — N189 Chronic kidney disease, unspecified: Secondary | ICD-10-CM

## 2014-02-04 DIAGNOSIS — D649 Anemia, unspecified: Secondary | ICD-10-CM

## 2014-02-04 DIAGNOSIS — N289 Disorder of kidney and ureter, unspecified: Secondary | ICD-10-CM

## 2014-02-04 DIAGNOSIS — C20 Malignant neoplasm of rectum: Secondary | ICD-10-CM

## 2014-02-04 DIAGNOSIS — N179 Acute kidney failure, unspecified: Secondary | ICD-10-CM

## 2014-02-04 LAB — CBC WITH DIFFERENTIAL/PLATELET
BASO%: 0.5 % (ref 0.0–2.0)
Basophils Absolute: 0 10*3/uL (ref 0.0–0.1)
EOS ABS: 0.3 10*3/uL (ref 0.0–0.5)
EOS%: 4.4 % (ref 0.0–7.0)
HCT: 35.7 % — ABNORMAL LOW (ref 38.4–49.9)
HGB: 11.4 g/dL — ABNORMAL LOW (ref 13.0–17.1)
LYMPH#: 1.3 10*3/uL (ref 0.9–3.3)
LYMPH%: 21.6 % (ref 14.0–49.0)
MCH: 31.5 pg (ref 27.2–33.4)
MCHC: 31.9 g/dL — ABNORMAL LOW (ref 32.0–36.0)
MCV: 98.6 fL — ABNORMAL HIGH (ref 79.3–98.0)
MONO#: 0.5 10*3/uL (ref 0.1–0.9)
MONO%: 8.4 % (ref 0.0–14.0)
NEUT%: 65.1 % (ref 39.0–75.0)
NEUTROS ABS: 4 10*3/uL (ref 1.5–6.5)
Platelets: 95 10*3/uL — ABNORMAL LOW (ref 140–400)
RBC: 3.62 10*6/uL — ABNORMAL LOW (ref 4.20–5.82)
RDW: 15 % — ABNORMAL HIGH (ref 11.0–14.6)
WBC: 6.2 10*3/uL (ref 4.0–10.3)

## 2014-02-04 MED ORDER — EPOETIN ALFA 20000 UNIT/ML IJ SOLN: 40000.0000 [IU] | Freq: Once | INTRAMUSCULAR | Status: DC

## 2014-02-25 ENCOUNTER — Ambulatory Visit (HOSPITAL_BASED_OUTPATIENT_CLINIC_OR_DEPARTMENT_OTHER): Payer: Medicare Other

## 2014-02-25 ENCOUNTER — Telehealth: Payer: Self-pay | Admitting: Oncology

## 2014-02-25 ENCOUNTER — Ambulatory Visit (HOSPITAL_BASED_OUTPATIENT_CLINIC_OR_DEPARTMENT_OTHER): Payer: Medicare Other | Admitting: Oncology

## 2014-02-25 ENCOUNTER — Other Ambulatory Visit (HOSPITAL_BASED_OUTPATIENT_CLINIC_OR_DEPARTMENT_OTHER): Payer: Medicare Other

## 2014-02-25 VITALS — BP 124/71 | HR 72 | Temp 98.4°F | Resp 18 | Ht 68.0 in | Wt 193.5 lb

## 2014-02-25 DIAGNOSIS — C20 Malignant neoplasm of rectum: Secondary | ICD-10-CM

## 2014-02-25 DIAGNOSIS — N189 Chronic kidney disease, unspecified: Secondary | ICD-10-CM

## 2014-02-25 DIAGNOSIS — D649 Anemia, unspecified: Secondary | ICD-10-CM

## 2014-02-25 DIAGNOSIS — D696 Thrombocytopenia, unspecified: Secondary | ICD-10-CM

## 2014-02-25 DIAGNOSIS — N289 Disorder of kidney and ureter, unspecified: Secondary | ICD-10-CM

## 2014-02-25 DIAGNOSIS — N179 Acute kidney failure, unspecified: Secondary | ICD-10-CM

## 2014-02-25 LAB — CBC WITH DIFFERENTIAL/PLATELET
BASO%: 0.4 % (ref 0.0–2.0)
Basophils Absolute: 0 10*3/uL (ref 0.0–0.1)
EOS%: 2.9 % (ref 0.0–7.0)
Eosinophils Absolute: 0.2 10*3/uL (ref 0.0–0.5)
HCT: 34.1 % — ABNORMAL LOW (ref 38.4–49.9)
HEMOGLOBIN: 11.2 g/dL — AB (ref 13.0–17.1)
LYMPH%: 14.8 % (ref 14.0–49.0)
MCH: 31.8 pg (ref 27.2–33.4)
MCHC: 32.8 g/dL (ref 32.0–36.0)
MCV: 97.1 fL (ref 79.3–98.0)
MONO#: 0.5 10*3/uL (ref 0.1–0.9)
MONO%: 7.1 % (ref 0.0–14.0)
NEUT#: 5.1 10*3/uL (ref 1.5–6.5)
NEUT%: 74.8 % (ref 39.0–75.0)
PLATELETS: 107 10*3/uL — AB (ref 140–400)
RBC: 3.51 10*6/uL — ABNORMAL LOW (ref 4.20–5.82)
RDW: 15 % — AB (ref 11.0–14.6)
WBC: 6.8 10*3/uL (ref 4.0–10.3)
lymph#: 1 10*3/uL (ref 0.9–3.3)

## 2014-02-25 MED ORDER — EPOETIN ALFA 20000 UNIT/ML IJ SOLN: 20000.0000 [IU] | Freq: Once | INTRAMUSCULAR | Status: DC

## 2014-02-25 NOTE — Telephone Encounter (Signed)
gv pt appt schedule for march thru july. confirmed w/LT pt will need mthly lb w/mthly inj. message to BS to add order for lb.

## 2014-02-25 NOTE — Progress Notes (Signed)
Patient in for office visit with Dr. Benay Spice, Labs, and Procrit injection today. HGB is 11.2 today. Patient states, "Dr. Benay Spice wants me to get my injection today." Spoke with Dr. Gearldine Shown nurse Merceda Elks, RN to verify Procrit 20,000 units order with HGB of 11.2. Patient to receive injection today per Dr. Gearldine Shown nurse Merceda Elks, RN. Injection given per Dr. Gearldine Shown orders. Patient tolerated injection well. Medication discontinued from [facility] per Pharmacy.

## 2014-02-25 NOTE — Patient Instructions (Signed)
Epoetin Alfa injection What is this medicine? EPOETIN ALFA (e POE e tin AL fa) helps your body make more red blood cells. This medicine is used to treat anemia caused by chronic kidney failure, cancer chemotherapy, or HIV-therapy. It may also be used before surgery if you have anemia. This medicine may be used for other purposes; ask your health care provider or pharmacist if you have questions. COMMON BRAND NAME(S): Epogen, Procrit What should I tell my health care provider before I take this medicine? They need to know if you have any of these conditions: -blood clotting disorders -cancer patient not on chemotherapy -cystic fibrosis -heart disease, such as angina or heart failure -hemoglobin level of 12 g/dL or greater -high blood pressure -low levels of folate, iron, or vitamin B12 -seizures -an unusual or allergic reaction to erythropoietin, albumin, benzyl alcohol, hamster proteins, other medicines, foods, dyes, or preservatives -pregnant or trying to get pregnant -breast-feeding How should I use this medicine? This medicine is for injection into a vein or under the skin. It is usually given by a health care professional in a hospital or clinic setting. If you get this medicine at home, you will be taught how to prepare and give this medicine. Use exactly as directed. Take your medicine at regular intervals. Do not take your medicine more often than directed. It is important that you put your used needles and syringes in a special sharps container. Do not put them in a trash can. If you do not have a sharps container, call your pharmacist or healthcare provider to get one. Talk to your pediatrician regarding the use of this medicine in children. While this drug may be prescribed for selected conditions, precautions do apply. Overdosage: If you think you have taken too much of this medicine contact a poison control center or emergency room at once. NOTE: This medicine is only for you. Do  not share this medicine with others. What if I miss a dose? If you miss a dose, take it as soon as you can. If it is almost time for your next dose, take only that dose. Do not take double or extra doses. What may interact with this medicine? Do not take this medicine with any of the following medications: -darbepoetin alfa This list may not describe all possible interactions. Give your health care provider a list of all the medicines, herbs, non-prescription drugs, or dietary supplements you use. Also tell them if you smoke, drink alcohol, or use illegal drugs. Some items may interact with your medicine. What should I watch for while using this medicine? Visit your prescriber or health care professional for regular checks on your progress and for the needed blood tests and blood pressure measurements. It is especially important for the doctor to make sure your hemoglobin level is in the desired range, to limit the risk of potential side effects and to give you the best benefit. Keep all appointments for any recommended tests. Check your blood pressure as directed. Ask your doctor what your blood pressure should be and when you should contact him or her. As your body makes more red blood cells, you may need to take iron, folic acid, or vitamin B supplements. Ask your doctor or health care provider which products are right for you. If you have kidney disease continue dietary restrictions, even though this medication can make you feel better. Talk with your doctor or health care professional about the foods you eat and the vitamins that you take. What   side effects may I notice from receiving this medicine? Side effects that you should report to your doctor or health care professional as soon as possible: -allergic reactions like skin rash, itching or hives, swelling of the face, lips, or tongue -breathing problems -changes in vision -chest pain -confusion, trouble speaking or understanding -feeling  faint or lightheaded, falls -high blood pressure -muscle aches or pains -pain, swelling, warmth in the leg -rapid weight gain -severe headaches -sudden numbness or weakness of the face, arm or leg -trouble walking, dizziness, loss of balance or coordination -seizures (convulsions) -swelling of the ankles, feet, hands -unusually weak or tired Side effects that usually do not require medical attention (report to your doctor or health care professional if they continue or are bothersome): -diarrhea -fever, chills (flu-like symptoms) -headaches -nausea, vomiting -redness, stinging, or swelling at site where injected This list may not describe all possible side effects. Call your doctor for medical advice about side effects. You may report side effects to FDA at 1-800-FDA-1088. Where should I keep my medicine? Keep out of the reach of children. Store in a refrigerator between 2 and 8 degrees C (36 and 46 degrees F). Do not freeze or shake. Throw away any unused portion if using a single-dose vial. Multi-dose vials can be kept in the refrigerator for up to 21 days after the initial dose. Throw away unused medicine. NOTE: This sheet is a summary. It may not cover all possible information. If you have questions about this medicine, talk to your doctor, pharmacist, or health care provider.  2014, [employer]vier/[employer] Standard. (2008-11-26 10:25:44)  

## 2014-02-25 NOTE — Progress Notes (Signed)
   [place]    OFFICE PROGRESS NOTE   INTERVAL HISTORY:   He returns for scheduled followup of anemia. He has been maintained on erythropoietin since December 2014. The erythropoietin was held on 02/04/2014 when the hemoglobin returned at 11.4. He continues iron.  Nicholas [name] has no specific complaint today. He reports variable edema in his legs.  Objective:  Vital signs in last 24 hours:  Blood pressure 124/71, pulse 72, temperature 98.4 F (36.9 C), temperature source Oral, resp. rate 18, height 5\' 8"  (1.727 m), weight 193 lb 8 oz (87.771 kg), SpO2 100.00%.    Resp: Lungs clear bilaterally Cardio: Irregular GI: No hepatomegaly, left lower quadrant colostomy Vascular: 1-2+ pitting edema at the right greater than left lower leg and foot   Lab Results:  Lab Results  Component Value Date   WBC 6.8 02/25/2014   HGB 11.2* 02/25/2014   HCT 34.1* 02/25/2014   MCV 97.1 02/25/2014   PLT 107* 02/25/2014   NEUTROABS 5.1 02/25/2014      Medications: I have reviewed the patient's current medications.  Assessment/Plan: 1. Rectal cancer, stage II, T3 N0, status post neoadjuvant infusional 5-fluorouracil and radiation, status post APR 06/01/2007. He completed 4 months of adjuvant Xeloda. 2. Anemia. Likely related to renal failure versus myelodysplasia. Improved with erythropoietin 3. Admission with pyelonephritis 11/20/2013. 4. Chronic renal failure. 5. Mild thrombocytopenia-? Myelodysplasia,? ITP-stable   Disposition:  The anemia has responded to erythropoietin. We will switch the erythropoietin to eighth 20,000 unit dose given every 4 weeks. The goal hemoglobin is 10-11.  He will return for an office visit in 4 months.   Betsy Coder, MD  02/25/2014  12:59 PM

## 2014-03-18 ENCOUNTER — Encounter: Payer: Self-pay | Admitting: Cardiology

## 2014-03-18 ENCOUNTER — Ambulatory Visit (INDEPENDENT_AMBULATORY_CARE_PROVIDER_SITE_OTHER): Payer: Medicare Other | Admitting: Cardiology

## 2014-03-18 ENCOUNTER — Other Ambulatory Visit (INDEPENDENT_AMBULATORY_CARE_PROVIDER_SITE_OTHER): Payer: Medicare Other

## 2014-03-18 VITALS — BP 136/78 | HR 73 | Ht 68.0 in | Wt 184.0 lb

## 2014-03-18 DIAGNOSIS — I4949 Other premature depolarization: Secondary | ICD-10-CM

## 2014-03-18 DIAGNOSIS — D509 Iron deficiency anemia, unspecified: Secondary | ICD-10-CM

## 2014-03-18 DIAGNOSIS — N179 Acute kidney failure, unspecified: Secondary | ICD-10-CM

## 2014-03-18 DIAGNOSIS — N289 Disorder of kidney and ureter, unspecified: Secondary | ICD-10-CM

## 2014-03-18 DIAGNOSIS — N189 Chronic kidney disease, unspecified: Secondary | ICD-10-CM

## 2014-03-18 DIAGNOSIS — I4891 Unspecified atrial fibrillation: Secondary | ICD-10-CM

## 2014-03-18 DIAGNOSIS — I493 Ventricular premature depolarization: Secondary | ICD-10-CM

## 2014-03-18 DIAGNOSIS — I10 Essential (primary) hypertension: Secondary | ICD-10-CM

## 2014-03-18 DIAGNOSIS — I2510 Atherosclerotic heart disease of native coronary artery without angina pectoris: Secondary | ICD-10-CM

## 2014-03-18 LAB — CBC WITH DIFFERENTIAL/PLATELET
BASOS ABS: 0 10*3/uL (ref 0.0–0.1)
Basophils Relative: 0.6 % (ref 0.0–3.0)
Eosinophils Absolute: 0.2 10*3/uL (ref 0.0–0.7)
Eosinophils Relative: 2.9 % (ref 0.0–5.0)
HCT: 34.7 % — ABNORMAL LOW (ref 39.0–52.0)
Hemoglobin: 11.4 g/dL — ABNORMAL LOW (ref 13.0–17.0)
LYMPHS ABS: 1.2 10*3/uL (ref 0.7–4.0)
Lymphocytes Relative: 19.5 % (ref 12.0–46.0)
MCHC: 33 g/dL (ref 30.0–36.0)
MCV: 97.2 fl (ref 78.0–100.0)
MONO ABS: 0.3 10*3/uL (ref 0.1–1.0)
Monocytes Relative: 5.1 % (ref 3.0–12.0)
Neutro Abs: 4.4 10*3/uL (ref 1.4–7.7)
Neutrophils Relative %: 71.9 % (ref 43.0–77.0)
PLATELETS: 131 10*3/uL — AB (ref 150.0–400.0)
RBC: 3.57 Mil/uL — ABNORMAL LOW (ref 4.22–5.81)
RDW: 15.6 % — ABNORMAL HIGH (ref 11.5–14.6)
WBC: 6.1 10*3/uL (ref 4.5–10.5)

## 2014-03-18 LAB — BASIC METABOLIC PANEL
BUN: 45 mg/dL — ABNORMAL HIGH (ref 6–23)
CO2: 29 mEq/L (ref 19–32)
Calcium: 10.3 mg/dL (ref 8.4–10.5)
Chloride: 105 mEq/L (ref 96–112)
Creatinine, Ser: 3.6 mg/dL — ABNORMAL HIGH (ref 0.4–1.5)
GFR: 17.47 mL/min — AB (ref >60.00)
Glucose, Bld: 98 mg/dL (ref 70–99)
Potassium: 4.1 mEq/L (ref 3.5–5.1)
Sodium: 141 mEq/L (ref 135–145)

## 2014-03-18 NOTE — Progress Notes (Signed)
Quick Note:  Please report to patient. The recent labs show that the renal function has not improved much. Not able to restart xarelto. Instead I would like him to start warfarin for his atrial fib through our coumadin clinic, if he is willing. It will protect better against stroke than the aspirin will. ______

## 2014-03-18 NOTE — Assessment & Plan Note (Addendum)
The patient presently is on just aspirin for his atrial fibrillation.  His creatinine clearance is not sufficient to allow for a novel anticoagulant drugs.  The patient also has a past history of GI bleed and a past history of colon cancer.  The patient has not been having any TIA symptoms.  We will talk to him about restarting warfarin.

## 2014-03-18 NOTE — Patient Instructions (Signed)
Your physician recommends that you continue on your current medications as directed. Please refer to the Current Medication list given to you today.  Your physician recommends that you schedule a follow-up appointment in: 2 month ov/bmet/cbc

## 2014-03-18 NOTE — Progress Notes (Signed)
Nicholas [name] Date of Birth:  [birth year] [address] [place], [place]  [address] [phone]         Fax   [phone]  History of Present Illness: This pleasant 80 year old gentleman is seen for a followup office visit. He has a past history of ischemic heart disease and had coronary artery bypass graft surgery on 05/27/92. He has a past history of compensated congestive heart failure. He had a nuclear stress test in June 2010 showing an old inferior scar but no reversible ischemia and his ejection fraction was 51%. He was seen as a work in by Dr. Ron Parker in early July 2014 and prior to that had been seen by Dr. Arelia Sneddon because of increasing peripheral edema. Dr. Arelia Sneddon did a chest x-ray which the patient was told was okay. Dr. Ron Parker had him increase his Lasix from 20 mg up to 40 mg daily.   When Dr. Ron Parker saw him his EKG showed atrial fibrillation which was new since 2013.  His most recent echocardiogram on 07/31/13 showed normal systolic function with ejection fraction 55% and there was inferior wall akinesis.  There was biatrial enlargement.  There was mild to moderate mitral regurgitation and his pulmonary artery pressure was elevated at 46-50 mm mercury.  His last visit he has diuresed well and has lost 20 pounds since January 2015.    The patient was hospitalized over Thanksgiving 2014 with acute renal failure.  Because his GFR was less than 15 he was no longer able to take Xarelto and he was placed on just a baby aspirin for his atrial fibrillation.  He was found to have a nonfunctioning right kidney and he had a history of passage of a kidney stone with hematuria.  His creatinine was 5.3 at discharge from the hospital.  He has not been aware of any further gross hematuria and is not having any hematochezia.  He is being followed by nephrology.  He had been drinking large quantities of diet sodas.  He states that he has diuresed to 20 pounds since he stopped drinking a diet sodas.   In January his creatinine was 3.9.  The patient has a history of anemia and has been taking iron pills twice a day.   Current Outpatient Prescriptions  Medication Sig Dispense Refill  . amLODipine-benazepril (LOTREL) 5-20 MG per capsule Take 1 capsule by mouth daily.  30 capsule  11  . aspirin EC 81 MG EC tablet Take 1 tablet (81 mg total) by mouth daily.  30 tablet  3  . Calcium Carbonate-Vitamin D (CALCIUM 600-D) 600-400 MG-UNIT per tablet Take 1 tablet by mouth 2 (two) times daily.       Marland Kitchen doxazosin (CARDURA) 8 MG tablet Take 8 mg by mouth at bedtime.       . ferrous sulfate 325 (65 FE) MG tablet Take 325 mg by mouth 2 (two) times daily with a meal.       . finasteride (PROSCAR) 5 MG tablet Take 5 mg by mouth daily.        . furosemide (LASIX) 40 MG tablet Take 20 mg by mouth daily.       . metoprolol succinate (TOPROL-XL) 25 MG 24 hr tablet Take 12.5 mg by mouth daily. TAKES 50 MG      . nitroGLYCERIN (NITROSTAT) 0.4 MG SL tablet Place 1 tablet (0.4 mg total) under the tongue every 5 (five) minutes as needed. For chest pain  25 tablet  6  .  pantoprazole (PROTONIX) 40 MG tablet Take 1 tablet (40 mg total) by mouth daily.  30 tablet  11  . potassium chloride SA (K-DUR,KLOR-CON) 20 MEQ tablet Take 1 tablet (20 mEq total) by mouth daily.  90 tablet  3  . Saw Palmetto, Serenoa repens, (SAW PALMETTO PO) Take 1 capsule by mouth every evening.      . simvastatin (ZOCOR) 20 MG tablet Take 1 tablet (20 mg total) by mouth at bedtime.  90 tablet  3  . vitamin C (ASCORBIC ACID) 500 MG tablet Take 500 mg by mouth daily.        . Vitamins-Lipotropics (B-100 COMPLEX) TABS Take 1 tablet by mouth daily.        No current facility-administered medications for this visit.    Allergies  Allergen Reactions  . Cephalexin Other (See Comments)    "too strong for my system" - per office note 05/05/2012  . Ciprofloxacin Other (See Comments)    Reaction unknown  . Hctz [Hydrochlorothiazide]     DIZZINESS   .  Levofloxacin Other (See Comments)    "too strong for my system" - per office note 05/05/2012  . Lipitor [Atorvastatin Calcium]     MUSCLE PAIN  . Niaspan [Niacin Er]     FLUSHING   . Oxycodone-Acetaminophen Other (See Comments)    Reaction unknown  . Sulfonamide Derivatives Other (See Comments)    Reaction unkonwn    Patient Active Problem List   Diagnosis Date Noted  . Hx of CABG 05/27/2011    Priority: High  . Asymptomatic PVCs 05/27/2011    Priority: High  . HYPERTENSION 05/08/2009    Priority: High  . Pyelonephritis, acute 11/20/2013  . Acute on chronic kidney failure 11/20/2013  . Pyelonephritis 11/20/2013  . Atrial fibrillation 07/10/2013  . Volume overload 07/10/2013  . Coronary artery disease   . Ejection fraction   . Renal insufficiency 05/05/2012  . BPH (benign prostatic hyperplasia) 09/21/2011  . Personal history of malignant neoplasm of rectum, rectosigmoid junction, and anus 05/14/2011  . GERD (gastroesophageal reflux disease) 05/14/2011  . Hernia of abdominal cavity 05/14/2011  . CARCINOMA, RECTUM 05/08/2009  . HYPERCHOLESTEROLEMIA 05/08/2009  . ANEMIA, IRON DEFICIENCY 05/08/2009  . MYOCARDIAL INFARCTION 05/08/2009  . GERD 05/08/2009  . DIVERTICULOSIS, COLON 05/08/2009  . DEGENERATIVE JOINT DISEASE, GENERALIZED 05/08/2009  . COLONIC POLYPS, ADENOMATOUS, HX OF 05/08/2009    History  Smoking status  . Former Smoker -- 10 years  . Types: Pipe, Cigars  . Quit date: 05/26/1991  Smokeless tobacco  . Current User  . Types: Chew    History  Alcohol Use No    Family History  Problem Relation Age of Onset  . Pancreatic cancer Father   . Diabetes Son   . Emphysema Mother   . Anesthesia problems Neg Hx   . Hypotension Neg Hx   . Malignant hyperthermia Neg Hx   . Pseudochol deficiency Neg Hx     Review of Systems: Constitutional: no fever chills diaphoresis or fatigue or change in weight.  Head and neck: no hearing loss, no epistaxis, no  photophobia or visual disturbance. Respiratory: No cough, shortness of breath or wheezing. Cardiovascular: No chest pain peripheral edema, palpitations. Gastrointestinal: No abdominal distention, no abdominal pain, no change in bowel habits hematochezia or melena. Genitourinary: No dysuria, no frequency, no urgency, no nocturia. Musculoskeletal:No arthralgias, no back pain, no gait disturbance or myalgias. Neurological: No dizziness, no headaches, no numbness, no seizures, no syncope, no weakness, no tremors.  Hematologic: No lymphadenopathy, no easy bruising. Psychiatric: No confusion, no hallucinations, no sleep disturbance.    Physical Exam: Filed Vitals:   03/18/14 1528  BP: 136/78  Pulse: 73   the general appearance reveals a large gentleman in no acute distress.  His weight is down 6 pounds.The head and neck exam reveals pupils equal and reactive.  Extraocular movements are full.  There is no scleral icterus.  The mouth and pharynx are normal.  The neck is supple.  The carotids reveal no bruits.  The jugular venous pressure is normal.  The  thyroid is not enlarged.  There is no lymphadenopathy.  The chest is clear to percussion and auscultation.  There are no rales or rhonchi.  Expansion of the chest is symmetrical.  The precordium is quiet.  The pulse is irregularly irregular in slow atrial fibrillation The first heart sound is normal.  The second heart sound is physiologically split.  There is no murmur gallop rub or click.  There is no abnormal lift or heave.  The abdomen is soft and nontender.  The bowel sounds are normal.  The liver and spleen are not enlarged.  There are no abdominal masses.  There are no abdominal bruits.  Extremities reveal good pedal pulses.  There is severe dependent bilateral peripheral edema which is chronic but which goes down at night.  There is no cyanosis or clubbing.  Strength is normal and symmetrical in all extremities.  There is no lateralizing weakness.   There are no sensory deficits.  The skin is warm and dry.  There is no rash.  EKG today shows atrial fibrillation slow ventricular response and with frequent PVCs.  Assessment / Plan: His hemoglobin is stable.  His renal function shows a creatinine clearance of only 17.  His renal function is not good enough yet for one of the newer agents.  We will talk to him about restarting warfarin for his atrial fibrillation.  His Chadsvasc score is 5. Recheck in 2 months for followup office visit CBC and basal metabolic panel

## 2014-03-18 NOTE — Assessment & Plan Note (Signed)
Blood pressure has been remaining stable on current therapy.  No headaches or dizziness

## 2014-03-18 NOTE — Assessment & Plan Note (Signed)
The patient has not been experiencing any chest pain or angina pectoris. 

## 2014-03-18 NOTE — Assessment & Plan Note (Signed)
The patient has significant renal impairment.  His renal function has improved only slightly since January.  Continue current therapy.  Continue to avoid diet sodas.

## 2014-03-19 ENCOUNTER — Telehealth: Payer: Self-pay | Admitting: *Deleted

## 2014-03-19 NOTE — Telephone Encounter (Signed)
Advised patient of lab results and will forward to coumadin clinic

## 2014-03-19 NOTE — Telephone Encounter (Signed)
Message copied by Earvin Hansen on Tue Mar 19, 2014  6:28 PM ------      Message from: Darlin Coco      Created: Mon Mar 18, 2014  8:08 PM       Please report to patient.  The recent labs show that the renal function has not improved much.  Not able to restart xarelto. Instead I would like him to start warfarin for his atrial fib through our coumadin clinic, if he is willing. It will protect better against stroke than the aspirin will. ------

## 2014-03-20 MED ORDER — WARFARIN SODIUM 3 MG PO TABS: 3.0000 mg | ORAL_TABLET | Freq: Every day | ORAL | Status: DC

## 2014-03-20 NOTE — Telephone Encounter (Signed)
Unable to reach patient.  He will be back home ~ 11 AM, so will try back

## 2014-03-20 NOTE — Telephone Encounter (Signed)
Patient instructed to start warfarin tonight.  Given he has a h/o GI bleed, will start him on warfarin 3 mg qd and d/c his aspirin today. Patient advised to only take tylenol for pain, and to avoid NSAIDs.  Recheck INR in 5 days.  Appointment made.

## 2014-03-25 ENCOUNTER — Other Ambulatory Visit (HOSPITAL_BASED_OUTPATIENT_CLINIC_OR_DEPARTMENT_OTHER): Payer: Medicare Other

## 2014-03-25 ENCOUNTER — Ambulatory Visit (INDEPENDENT_AMBULATORY_CARE_PROVIDER_SITE_OTHER): Payer: Medicare Other

## 2014-03-25 ENCOUNTER — Ambulatory Visit: Payer: Medicare Other

## 2014-03-25 DIAGNOSIS — I4891 Unspecified atrial fibrillation: Secondary | ICD-10-CM

## 2014-03-25 DIAGNOSIS — N179 Acute kidney failure, unspecified: Secondary | ICD-10-CM

## 2014-03-25 DIAGNOSIS — Z5181 Encounter for therapeutic drug level monitoring: Secondary | ICD-10-CM

## 2014-03-25 DIAGNOSIS — N189 Chronic kidney disease, unspecified: Principal | ICD-10-CM

## 2014-03-25 DIAGNOSIS — N289 Disorder of kidney and ureter, unspecified: Secondary | ICD-10-CM

## 2014-03-25 DIAGNOSIS — D649 Anemia, unspecified: Secondary | ICD-10-CM

## 2014-03-25 LAB — CBC WITH DIFFERENTIAL/PLATELET
BASO%: 0.6 % (ref 0.0–2.0)
Basophils Absolute: 0 10*3/uL (ref 0.0–0.1)
EOS%: 2.5 % (ref 0.0–7.0)
Eosinophils Absolute: 0.2 10*3/uL (ref 0.0–0.5)
HCT: 35.6 % — ABNORMAL LOW (ref 38.4–49.9)
HGB: 11.5 g/dL — ABNORMAL LOW (ref 13.0–17.1)
LYMPH%: 15.5 % (ref 14.0–49.0)
MCH: 31.5 pg (ref 27.2–33.4)
MCHC: 32.3 g/dL (ref 32.0–36.0)
MCV: 97.5 fL (ref 79.3–98.0)
MONO#: 0.4 10*3/uL (ref 0.1–0.9)
MONO%: 5.5 % (ref 0.0–14.0)
NEUT#: 5.4 10*3/uL (ref 1.5–6.5)
NEUT%: 75.9 % — ABNORMAL HIGH (ref 39.0–75.0)
Platelets: 109 10*3/uL — ABNORMAL LOW (ref 140–400)
RBC: 3.65 10*6/uL — ABNORMAL LOW (ref 4.20–5.82)
RDW: 15.1 % — ABNORMAL HIGH (ref 11.0–14.6)
WBC: 7.1 10*3/uL (ref 4.0–10.3)
lymph#: 1.1 10*3/uL (ref 0.9–3.3)

## 2014-03-25 LAB — POCT INR: INR: 1.2

## 2014-03-25 MED ORDER — EPOETIN ALFA 20000 UNIT/ML IJ SOLN: 20000.0000 [IU] | Freq: Once | INTRAMUSCULAR | Status: DC

## 2014-03-25 NOTE — Patient Instructions (Signed)

## 2014-04-01 ENCOUNTER — Ambulatory Visit (INDEPENDENT_AMBULATORY_CARE_PROVIDER_SITE_OTHER): Payer: Medicare Other | Admitting: Pharmacist

## 2014-04-01 DIAGNOSIS — Z5181 Encounter for therapeutic drug level monitoring: Secondary | ICD-10-CM

## 2014-04-01 DIAGNOSIS — I4891 Unspecified atrial fibrillation: Secondary | ICD-10-CM

## 2014-04-01 LAB — POCT INR: INR: 2.1

## 2014-04-08 ENCOUNTER — Ambulatory Visit (INDEPENDENT_AMBULATORY_CARE_PROVIDER_SITE_OTHER): Payer: Medicare Other

## 2014-04-08 DIAGNOSIS — Z5181 Encounter for therapeutic drug level monitoring: Secondary | ICD-10-CM

## 2014-04-08 DIAGNOSIS — I4891 Unspecified atrial fibrillation: Secondary | ICD-10-CM

## 2014-04-08 LAB — POCT INR: INR: 2

## 2014-04-15 ENCOUNTER — Ambulatory Visit (INDEPENDENT_AMBULATORY_CARE_PROVIDER_SITE_OTHER): Payer: Medicare Other | Admitting: *Deleted

## 2014-04-15 DIAGNOSIS — Z5181 Encounter for therapeutic drug level monitoring: Secondary | ICD-10-CM

## 2014-04-15 DIAGNOSIS — I4891 Unspecified atrial fibrillation: Secondary | ICD-10-CM

## 2014-04-15 LAB — POCT INR: INR: 2.3

## 2014-04-19 ENCOUNTER — Other Ambulatory Visit: Payer: Self-pay | Admitting: *Deleted

## 2014-04-19 DIAGNOSIS — D649 Anemia, unspecified: Secondary | ICD-10-CM

## 2014-04-22 ENCOUNTER — Ambulatory Visit (INDEPENDENT_AMBULATORY_CARE_PROVIDER_SITE_OTHER): Payer: Medicare Other

## 2014-04-22 ENCOUNTER — Ambulatory Visit (HOSPITAL_BASED_OUTPATIENT_CLINIC_OR_DEPARTMENT_OTHER): Payer: Medicare Other

## 2014-04-22 ENCOUNTER — Other Ambulatory Visit (HOSPITAL_BASED_OUTPATIENT_CLINIC_OR_DEPARTMENT_OTHER): Payer: Medicare Other

## 2014-04-22 VITALS — BP 133/55 | HR 43 | Temp 97.6°F

## 2014-04-22 DIAGNOSIS — N189 Chronic kidney disease, unspecified: Secondary | ICD-10-CM

## 2014-04-22 DIAGNOSIS — Z5181 Encounter for therapeutic drug level monitoring: Secondary | ICD-10-CM

## 2014-04-22 DIAGNOSIS — D649 Anemia, unspecified: Secondary | ICD-10-CM

## 2014-04-22 DIAGNOSIS — N289 Disorder of kidney and ureter, unspecified: Secondary | ICD-10-CM

## 2014-04-22 DIAGNOSIS — I4891 Unspecified atrial fibrillation: Secondary | ICD-10-CM

## 2014-04-22 DIAGNOSIS — N179 Acute kidney failure, unspecified: Secondary | ICD-10-CM

## 2014-04-22 LAB — CBC WITH DIFFERENTIAL/PLATELET
BASO%: 0.3 % (ref 0.0–2.0)
Basophils Absolute: 0 10*3/uL (ref 0.0–0.1)
EOS ABS: 0.3 10*3/uL (ref 0.0–0.5)
EOS%: 3.2 % (ref 0.0–7.0)
HCT: 32.9 % — ABNORMAL LOW (ref 38.4–49.9)
HGB: 10.8 g/dL — ABNORMAL LOW (ref 13.0–17.1)
LYMPH#: 1.2 10*3/uL (ref 0.9–3.3)
LYMPH%: 15.7 % (ref 14.0–49.0)
MCH: 31.5 pg (ref 27.2–33.4)
MCHC: 32.8 g/dL (ref 32.0–36.0)
MCV: 95.9 fL (ref 79.3–98.0)
MONO#: 0.6 10*3/uL (ref 0.1–0.9)
MONO%: 7.1 % (ref 0.0–14.0)
NEUT%: 73.7 % (ref 39.0–75.0)
NEUTROS ABS: 5.7 10*3/uL (ref 1.5–6.5)
Platelets: 99 10*3/uL — ABNORMAL LOW (ref 140–400)
RBC: 3.43 10*6/uL — AB (ref 4.20–5.82)
RDW: 15.2 % — ABNORMAL HIGH (ref 11.0–14.6)
WBC: 7.8 10*3/uL (ref 4.0–10.3)

## 2014-04-22 LAB — POCT INR: INR: 2.7

## 2014-04-22 MED ORDER — EPOETIN ALFA 20000 UNIT/ML IJ SOLN
20000.0000 [IU] | Freq: Once | INTRAMUSCULAR | Status: AC
  Administered 2014-04-22: 20000 [IU] via SUBCUTANEOUS
  Filled 2014-04-22: qty 1

## 2014-05-02 ENCOUNTER — Ambulatory Visit (INDEPENDENT_AMBULATORY_CARE_PROVIDER_SITE_OTHER): Payer: Medicare Other

## 2014-05-02 DIAGNOSIS — I4891 Unspecified atrial fibrillation: Secondary | ICD-10-CM

## 2014-05-02 DIAGNOSIS — Z5181 Encounter for therapeutic drug level monitoring: Secondary | ICD-10-CM

## 2014-05-02 LAB — POCT INR: INR: 3.1

## 2014-05-16 ENCOUNTER — Ambulatory Visit (INDEPENDENT_AMBULATORY_CARE_PROVIDER_SITE_OTHER): Payer: Medicare Other | Admitting: Pharmacist Clinician (PhC)/ Clinical Pharmacy Specialist

## 2014-05-16 DIAGNOSIS — Z5181 Encounter for therapeutic drug level monitoring: Secondary | ICD-10-CM

## 2014-05-16 DIAGNOSIS — I4891 Unspecified atrial fibrillation: Secondary | ICD-10-CM

## 2014-05-16 LAB — POCT INR: INR: 2.3

## 2014-05-21 ENCOUNTER — Encounter: Payer: Self-pay | Admitting: Nurse Practitioner

## 2014-05-21 ENCOUNTER — Ambulatory Visit (HOSPITAL_BASED_OUTPATIENT_CLINIC_OR_DEPARTMENT_OTHER): Payer: Medicare Other

## 2014-05-21 ENCOUNTER — Other Ambulatory Visit (HOSPITAL_BASED_OUTPATIENT_CLINIC_OR_DEPARTMENT_OTHER): Payer: Medicare Other

## 2014-05-21 ENCOUNTER — Ambulatory Visit (INDEPENDENT_AMBULATORY_CARE_PROVIDER_SITE_OTHER): Payer: Medicare Other | Admitting: Nurse Practitioner

## 2014-05-21 VITALS — BP 127/50 | HR 55 | Temp 97.0°F

## 2014-05-21 VITALS — BP 130/62 | HR 53 | Ht 70.0 in | Wt 188.8 lb

## 2014-05-21 DIAGNOSIS — I4891 Unspecified atrial fibrillation: Secondary | ICD-10-CM

## 2014-05-21 DIAGNOSIS — N179 Acute kidney failure, unspecified: Secondary | ICD-10-CM

## 2014-05-21 DIAGNOSIS — D649 Anemia, unspecified: Secondary | ICD-10-CM

## 2014-05-21 DIAGNOSIS — N189 Chronic kidney disease, unspecified: Secondary | ICD-10-CM

## 2014-05-21 DIAGNOSIS — N289 Disorder of kidney and ureter, unspecified: Secondary | ICD-10-CM

## 2014-05-21 DIAGNOSIS — D509 Iron deficiency anemia, unspecified: Secondary | ICD-10-CM

## 2014-05-21 DIAGNOSIS — I2510 Atherosclerotic heart disease of native coronary artery without angina pectoris: Secondary | ICD-10-CM

## 2014-05-21 DIAGNOSIS — I119 Hypertensive heart disease without heart failure: Secondary | ICD-10-CM

## 2014-05-21 LAB — CBC WITH DIFFERENTIAL/PLATELET
BASO%: 0.5 % (ref 0.0–2.0)
BASOS ABS: 0 10*3/uL (ref 0.0–0.1)
EOS%: 2.8 % (ref 0.0–7.0)
Eosinophils Absolute: 0.2 10*3/uL (ref 0.0–0.5)
HCT: 32.6 % — ABNORMAL LOW (ref 38.4–49.9)
HGB: 10.3 g/dL — ABNORMAL LOW (ref 13.0–17.1)
LYMPH%: 14.4 % (ref 14.0–49.0)
MCH: 32.2 pg (ref 27.2–33.4)
MCHC: 31.7 g/dL — ABNORMAL LOW (ref 32.0–36.0)
MCV: 101.6 fL — ABNORMAL HIGH (ref 79.3–98.0)
MONO#: 0.4 10*3/uL (ref 0.1–0.9)
MONO%: 6.2 % (ref 0.0–14.0)
NEUT%: 76.1 % — ABNORMAL HIGH (ref 39.0–75.0)
NEUTROS ABS: 4.7 10*3/uL (ref 1.5–6.5)
Platelets: 106 10*3/uL — ABNORMAL LOW (ref 140–400)
RBC: 3.21 10*6/uL — ABNORMAL LOW (ref 4.20–5.82)
RDW: 17 % — ABNORMAL HIGH (ref 11.0–14.6)
WBC: 6.2 10*3/uL (ref 4.0–10.3)
lymph#: 0.9 10*3/uL (ref 0.9–3.3)

## 2014-05-21 MED ORDER — EPOETIN ALFA 20000 UNIT/ML IJ SOLN
20000.0000 [IU] | Freq: Once | INTRAMUSCULAR | Status: AC
  Administered 2014-05-21: 20000 [IU] via SUBCUTANEOUS
  Filled 2014-05-21: qty 1

## 2014-05-21 NOTE — Patient Instructions (Signed)
I think you are doing ok  Stay on your current medicines  Think about wearing knee high support stockings for your swelling  See Dr. Mare Ferrari in 4 months  Call the [facility] office at [phone] if you have any questions, problems or concerns.

## 2014-05-21 NOTE — Progress Notes (Signed)
Warden Fillers Date of Birth: 1934/10/27 Medical Record #[id number]  History of Present Illness: Nicholas [name] is seen back today for a 2 month check. Seen for Dr. Mare Ferrari. He has CAD with remote CABG in 1993, atrial fib, CKD from nonfunctioning right kidney and was only on aspirin for anticoagulation - now on coumadin, past colon cancer, anemia and CHF.  Seen here in March. Seemed to be doing ok.   Comes back today. Here alone. Doing ok. No chest pain. Does have some shortness of breath with walking too far. He continues to get his injections for his anemia. No active bleeding. Ok on his coumadin. No falls. Not dizzy or lightheaded. No chest pain. Mostly limited by knee pain. Some swelling in his legs, worse on the right (prior vein harvesting). Does not use support stockings.   Current Outpatient Prescriptions  Medication Sig Dispense Refill  . amLODipine-benazepril (LOTREL) 5-20 MG per capsule Take 1 capsule by mouth daily.  30 capsule  11  . Calcium Carbonate-Vitamin D (CALCIUM 600-D) 600-400 MG-UNIT per tablet Take 1 tablet by mouth 2 (two) times daily.       Marland Kitchen doxazosin (CARDURA) 8 MG tablet Take 8 mg by mouth at bedtime.       . ferrous sulfate 325 (65 FE) MG tablet Take 325 mg by mouth 2 (two) times daily with a meal.       . finasteride (PROSCAR) 5 MG tablet Take 5 mg by mouth daily.        . furosemide (LASIX) 40 MG tablet Take 20 mg by mouth daily.       . metoprolol succinate (TOPROL-XL) 25 MG 24 hr tablet Take 12.5 mg by mouth daily. TAKES 50 MG      . nitroGLYCERIN (NITROSTAT) 0.4 MG SL tablet Place 1 tablet (0.4 mg total) under the tongue every 5 (five) minutes as needed. For chest pain  25 tablet  6  . pantoprazole (PROTONIX) 40 MG tablet Take 1 tablet (40 mg total) by mouth daily.  30 tablet  11  . potassium chloride SA (K-DUR,KLOR-CON) 20 MEQ tablet Take 1 tablet (20 mEq total) by mouth daily.  90 tablet  3  . Saw Palmetto, Serenoa repens, (SAW PALMETTO PO) Take 1 capsule by  mouth every evening.      . simvastatin (ZOCOR) 20 MG tablet Take 1 tablet (20 mg total) by mouth at bedtime.  90 tablet  3  . vitamin C (ASCORBIC ACID) 500 MG tablet Take 500 mg by mouth daily.        . Vitamins-Lipotropics (B-100 COMPLEX) TABS Take 1 tablet by mouth daily.       Marland Kitchen warfarin (COUMADIN) 3 MG tablet Take 1 tablet (3 mg total) by mouth daily.  30 tablet  3   No current facility-administered medications for this visit.    Allergies  Allergen Reactions  . Cephalexin Other (See Comments)    "too strong for my system" - per office note 05/05/2012  . Ciprofloxacin Other (See Comments)    Reaction unknown  . Hctz [Hydrochlorothiazide]     DIZZINESS   . Levofloxacin Other (See Comments)    "too strong for my system" - per office note 05/05/2012  . Lipitor [Atorvastatin Calcium]     MUSCLE PAIN  . Niaspan [Niacin Er]     FLUSHING   . Oxycodone-Acetaminophen Other (See Comments)    Reaction unknown  . Sulfonamide Derivatives Other (See Comments)    Reaction unkonwn  Past Medical History  Diagnosis Date  . 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 Pure hypercholesterolemia     takes Vytorin daily  . Personal history of colonic polyps     adenomatous  . Diverticulosis of colon (without mention of hemorrhage)   . Cardiovascular disease   . IHD (ischemic heart disease)   . History of colon cancer   . Colon cancer   . Hypertension     takes Amlodipine,Metoprolol,and Cardura daily  . Coronary artery disease   . Acute myocardial infarction, unspecified site, episode of care unspecified   . MI (myocardial infarction) 1993  . CHF (congestive heart failure)     takes Lasix daily  . H/O blood clots 1993  . Dysrhythmia   . Shortness of breath     with exertion  . Arthritis   . Joint pain   . Joint swelling   . Edema     to right leg-below knee;not any different than when he saw brackbill in jan 2013  . Bruises easily     takes ASA daily  . Skin cancer     nose  .  GERD (gastroesophageal reflux disease)     takes Protonix daily  . Diarrhea   . Constipation   . Enlarged prostate     takes Finasteride daily  . Blood transfusion 2008  . Insomnia   . Hx of CABG     1993  . Ejection fraction     EF 51%, nuclear, 2010    Past Surgical History  Procedure Laterality Date  . Shoulder surgery  2007    left  . Cataract extraction      right  . Abdominoperineal proctocolectomy  06/01/2007  . Coronary angioplasty  05/22/92  . Colostomy  2008  . Colonoscopy    . Coronary artery bypass graft  1993  . Colon surgery  2008    colectomy  . Knee arthroscopy  2010    right   . Total knee arthroplasty  02/28/2012    Procedure: TOTAL KNEE ARTHROPLASTY;  Surgeon: Rudean Haskell, MD;  Location: [facility];  Service: Orthopedics;  Laterality: Right;    History  Smoking status  . Former Smoker -- 10 years  . Types: Pipe, Cigars  . Quit date: 05/26/1991  Smokeless tobacco  . Current User  . Types: Chew    History  Alcohol Use No    Family History  Problem Relation Age of Onset  . Pancreatic cancer Father   . Diabetes Son   . Emphysema Mother   . Anesthesia problems Neg Hx   . Hypotension Neg Hx   . Malignant hyperthermia Neg Hx   . Pseudochol deficiency Neg Hx     Review of Systems: The review of systems is per the HPI.  All other systems were reviewed and are negative.  Physical Exam: BP 130/62  Pulse 53  Ht 5\' 10"  (1.778 m)  Wt 188 lb 12.8 oz (85.639 kg)  BMI 27.09 kg/m2 Patient is very pleasant and in no acute distress. Skin is warm and dry. Color is normal.  HEENT is unremarkable. Normocephalic/atraumatic. PERRL. Sclera are nonicteric. Neck is supple. No masses. No JVD. Lungs are clear. Cardiac exam shows an  irregular rhythm. Rate ok. Abdomen is soft. Extremities are without edema. Gait and ROM are intact. No gross neurologic deficits noted.  Wt Readings from Last 3 Encounters:  05/21/14 188 lb 12.8 oz (85.639 kg)  03/18/14 184 lb (83.462  kg)  02/25/14 193 lb 8 oz (87.771 kg)     LABORATORY DATA: Lab Results  Component Value Date   WBC 7.8 04/22/2014   HGB 10.8* 04/22/2014   HCT 32.9* 04/22/2014   PLT 99* 04/22/2014   GLUCOSE 98 03/18/2014   CHOL 116 06/08/2013   TRIG 47.0 06/08/2013   HDL 52.40 06/08/2013   LDLCALC 54 06/08/2013   ALT 15 11/24/2013   AST 21 11/24/2013   NA 141 03/18/2014   K 4.1 03/18/2014   CL 105 03/18/2014   CREATININE 3.6* 03/18/2014   BUN 45* 03/18/2014   CO2 29 03/18/2014   TSH 1.457 11/20/2013   INR 2.3 05/16/2014    Lab Results  Component Value Date   INR 2.3 05/16/2014   INR 3.1 05/02/2014   INR 2.7 04/22/2014    Echo Study Conclusions from August 2014  - Left ventricle: The cavity size was normal. Wall thickness was increased in a pattern of mild LVH except basal posterior wall which was thinned. The estimated ejection fraction was 55%. Basal posterior and basal inferior akinesis. Indeterminant diastolic function (atrial fibrillation). - Aortic valve: Mild regurgitation. - Mitral valve: Mild to moderate regurgitation. Possible infarct-related MR given inferoposterior wall motion abnormality. - Left atrium: The atrium was moderately dilated. - Right ventricle: The cavity size was normal. Systolic function was normal. - Right atrium: The atrium was mildly dilated. - Tricuspid valve: Peak RV-RA gradient: 86mm Hg (S). - Pulmonary arteries: PA systolic pressure 93-79 mmHg. - Systemic veins: IVC measured 2.0 cm with normal respirophasic variation, suggesting RA pressure 6-10 mmHg. Impressions:  - The patient was in atrial fibrillation. Normal LV size and systolic function, EF 02%. Mild LV hypertrophy. Basal inferior and basal posterior akinesis. Mild to moderate MR, possibly infarct-related given basal inferoposterior wall motion abnormality. Normal RV size and systolic function. Mild to moderate pulmonary hypertension.   Myoview Impression from January 2013  Exercise Capacity:  [place] with no exercise.  BP Response: Normal blood pressure response.  Clinical Symptoms: No chest pain.  ECG Impression: No significant ST segment change suggestive of ischemia; frequent PVCs.  Comparison with Prior Nuclear Study: No images to compare  Overall Impression: Abnormal stress nuclear study with a small fixed basal inferolateral defect suggestive of small prior infarct; no ischemia.  Kirk Ruths      Assessment / Plan: 1. CAD - remote CABG - no active symptoms. Managed medically.   2. Valvular heart disease - looks compensated at this time.   3. Chronic atrial fib - rate ok. Now back on coumadin.   4. Swelling - may have too much salt usage. Would encourage use of support stockings.   See back in 4 months.   Patient is agreeable to this plan and will call if any problems develop in the interim.   Burtis Junes, RN, [facility] [address] [place], [place]  [address] [phone]

## 2014-05-21 NOTE — Patient Instructions (Signed)
Epoetin Alfa injection What is this medicine? EPOETIN ALFA (e POE e tin AL fa) helps your body make more red blood cells. This medicine is used to treat anemia caused by chronic kidney failure, cancer chemotherapy, or HIV-therapy. It may also be used before surgery if you have anemia. This medicine may be used for other purposes; ask your health care provider or pharmacist if you have questions. COMMON BRAND NAME(S): Epogen, Procrit What should I tell my health care provider before I take this medicine? They need to know if you have any of these conditions: -blood clotting disorders -cancer patient not on chemotherapy -cystic fibrosis -heart disease, such as angina or heart failure -hemoglobin level of 12 g/dL or greater -high blood pressure -low levels of folate, iron, or vitamin B12 -seizures -an unusual or allergic reaction to erythropoietin, albumin, benzyl alcohol, hamster proteins, other medicines, foods, dyes, or preservatives -pregnant or trying to get pregnant -breast-feeding How should I use this medicine? This medicine is for injection into a vein or under the skin. It is usually given by a health care professional in a hospital or clinic setting. If you get this medicine at home, you will be taught how to prepare and give this medicine. Use exactly as directed. Take your medicine at regular intervals. Do not take your medicine more often than directed. It is important that you put your used needles and syringes in a special sharps container. Do not put them in a trash can. If you do not have a sharps container, call your pharmacist or healthcare provider to get one. Talk to your pediatrician regarding the use of this medicine in children. While this drug may be prescribed for selected conditions, precautions do apply. Overdosage: If you think you have taken too much of this medicine contact a poison control center or emergency room at once. NOTE: This medicine is only for you. Do  not share this medicine with others. What if I miss a dose? If you miss a dose, take it as soon as you can. If it is almost time for your next dose, take only that dose. Do not take double or extra doses. What may interact with this medicine? Do not take this medicine with any of the following medications: -darbepoetin alfa This list may not describe all possible interactions. Give your health care provider a list of all the medicines, herbs, non-prescription drugs, or dietary supplements you use. Also tell them if you smoke, drink alcohol, or use illegal drugs. Some items may interact with your medicine. What should I watch for while using this medicine? Visit your prescriber or health care professional for regular checks on your progress and for the needed blood tests and blood pressure measurements. It is especially important for the doctor to make sure your hemoglobin level is in the desired range, to limit the risk of potential side effects and to give you the best benefit. Keep all appointments for any recommended tests. Check your blood pressure as directed. Ask your doctor what your blood pressure should be and when you should contact him or her. As your body makes more red blood cells, you may need to take iron, folic acid, or vitamin B supplements. Ask your doctor or health care provider which products are right for you. If you have kidney disease continue dietary restrictions, even though this medication can make you feel better. Talk with your doctor or health care professional about the foods you eat and the vitamins that you take. What   side effects may I notice from receiving this medicine? Side effects that you should report to your doctor or health care professional as soon as possible: -allergic reactions like skin rash, itching or hives, swelling of the face, lips, or tongue -breathing problems -changes in vision -chest pain -confusion, trouble speaking or understanding -feeling  faint or lightheaded, falls -high blood pressure -muscle aches or pains -pain, swelling, warmth in the leg -rapid weight gain -severe headaches -sudden numbness or weakness of the face, arm or leg -trouble walking, dizziness, loss of balance or coordination -seizures (convulsions) -swelling of the ankles, feet, hands -unusually weak or tired Side effects that usually do not require medical attention (report to your doctor or health care professional if they continue or are bothersome): -diarrhea -fever, chills (flu-like symptoms) -headaches -nausea, vomiting -redness, stinging, or swelling at site where injected This list may not describe all possible side effects. Call your doctor for medical advice about side effects. You may report side effects to FDA at 1-800-FDA-1088. Where should I keep my medicine? Keep out of the reach of children. Store in a refrigerator between 2 and 8 degrees C (36 and 46 degrees F). Do not freeze or shake. Throw away any unused portion if using a single-dose vial. Multi-dose vials can be kept in the refrigerator for up to 21 days after the initial dose. Throw away unused medicine. NOTE: This sheet is a summary. It may not cover all possible information. If you have questions about this medicine, talk to your doctor, pharmacist, or health care provider.  2014, [employer]vier/[employer] Standard. (2008-11-26 10:25:44)  

## 2014-06-13 ENCOUNTER — Ambulatory Visit (INDEPENDENT_AMBULATORY_CARE_PROVIDER_SITE_OTHER): Payer: Medicare Other | Admitting: *Deleted

## 2014-06-13 DIAGNOSIS — I4891 Unspecified atrial fibrillation: Secondary | ICD-10-CM

## 2014-06-13 DIAGNOSIS — Z5181 Encounter for therapeutic drug level monitoring: Secondary | ICD-10-CM

## 2014-06-13 LAB — POCT INR: INR: 3

## 2014-06-17 ENCOUNTER — Ambulatory Visit (HOSPITAL_BASED_OUTPATIENT_CLINIC_OR_DEPARTMENT_OTHER): Payer: Medicare Other | Admitting: Oncology

## 2014-06-17 ENCOUNTER — Other Ambulatory Visit (HOSPITAL_BASED_OUTPATIENT_CLINIC_OR_DEPARTMENT_OTHER): Payer: Medicare Other

## 2014-06-17 ENCOUNTER — Telehealth: Payer: Self-pay | Admitting: Oncology

## 2014-06-17 ENCOUNTER — Ambulatory Visit (HOSPITAL_BASED_OUTPATIENT_CLINIC_OR_DEPARTMENT_OTHER): Payer: Medicare Other

## 2014-06-17 VITALS — BP 142/46 | HR 43 | Temp 96.8°F | Resp 18 | Ht 70.0 in | Wt 186.1 lb

## 2014-06-17 DIAGNOSIS — D649 Anemia, unspecified: Secondary | ICD-10-CM

## 2014-06-17 DIAGNOSIS — D631 Anemia in chronic kidney disease: Secondary | ICD-10-CM

## 2014-06-17 DIAGNOSIS — N039 Chronic nephritic syndrome with unspecified morphologic changes: Secondary | ICD-10-CM

## 2014-06-17 DIAGNOSIS — N189 Chronic kidney disease, unspecified: Secondary | ICD-10-CM

## 2014-06-17 DIAGNOSIS — Z85048 Personal history of other malignant neoplasm of rectum, rectosigmoid junction, and anus: Secondary | ICD-10-CM

## 2014-06-17 DIAGNOSIS — N289 Disorder of kidney and ureter, unspecified: Secondary | ICD-10-CM

## 2014-06-17 DIAGNOSIS — N179 Acute kidney failure, unspecified: Secondary | ICD-10-CM

## 2014-06-17 DIAGNOSIS — C20 Malignant neoplasm of rectum: Secondary | ICD-10-CM

## 2014-06-17 LAB — CBC WITH DIFFERENTIAL/PLATELET
BASO%: 0.3 % (ref 0.0–2.0)
Basophils Absolute: 0 10*3/uL (ref 0.0–0.1)
EOS ABS: 0.2 10*3/uL (ref 0.0–0.5)
EOS%: 3.6 % (ref 0.0–7.0)
HCT: 31.9 % — ABNORMAL LOW (ref 38.4–49.9)
HGB: 10.6 g/dL — ABNORMAL LOW (ref 13.0–17.1)
LYMPH%: 15.1 % (ref 14.0–49.0)
MCH: 33.1 pg (ref 27.2–33.4)
MCHC: 33.2 g/dL (ref 32.0–36.0)
MCV: 99.7 fL — ABNORMAL HIGH (ref 79.3–98.0)
MONO#: 0.6 10*3/uL (ref 0.1–0.9)
MONO%: 10.2 % (ref 0.0–14.0)
NEUT#: 4.2 10*3/uL (ref 1.5–6.5)
NEUT%: 70.8 % (ref 39.0–75.0)
PLATELETS: 85 10*3/uL — AB (ref 140–400)
RBC: 3.2 10*6/uL — ABNORMAL LOW (ref 4.20–5.82)
RDW: 14.8 % — AB (ref 11.0–14.6)
WBC: 5.9 10*3/uL (ref 4.0–10.3)
lymph#: 0.9 10*3/uL (ref 0.9–3.3)

## 2014-06-17 MED ORDER — EPOETIN ALFA 20000 UNIT/ML IJ SOLN
20000.0000 [IU] | Freq: Once | INTRAMUSCULAR | Status: AC
  Administered 2014-06-17: 20000 [IU] via SUBCUTANEOUS
  Filled 2014-06-17: qty 1

## 2014-06-17 NOTE — Progress Notes (Signed)
  Nicholas [name] OFFICE PROGRESS NOTE   Diagnosis: Anemia secondary to renal failure, history of rectal cancer  INTERVAL HISTORY:   Nicholas [name] continues Procrit. He was last treated with Procrit 05/21/2014. No new complaint today.  Objective:  Vital signs in last 24 hours:  Blood pressure 142/46, pulse 43, temperature 96.8 F (36 C), temperature source Oral, resp. rate 18, height 5\' 10"  (1.778 m), weight 186 lb 1.6 oz (84.414 kg).    HEENT: Neck without mass Lymphatics: No cervical, supraclavicular, or inguinal nodes. Soft mobile 1/2-1 cm bilateral axillary nodes Resp: Lungs clear bilaterally Cardio: Irregular GI: No hepatosplenomegaly, left lower quadrant colostomy with associated hernia Vascular: Right greater than left lower leg edema   Lab Results:  Lab Results  Component Value Date   WBC 5.9 06/17/2014   HGB 10.6* 06/17/2014   HCT 31.9* 06/17/2014   MCV 99.7* 06/17/2014   PLT 85* 06/17/2014   NEUTROABS 4.2 06/17/2014    Medications: I have reviewed the patient's current medications.  Assessment/Plan: 1. Rectal cancer, stage II, T3 N0, status post neoadjuvant infusional 5-fluorouracil and radiation, status post APR 06/01/2007. He completed 4 months of adjuvant Xeloda.  Colonoscopy June 2012-negative, repeat planned for 5 years 2. Anemia. Likely related to renal failure versus myelodysplasia. Improved with erythropoietin 3. Admission with pyelonephritis 11/20/2013. 4. Chronic renal failure. 5. Mild thrombocytopenia-? Myelodysplasia,? ITP-stable      Disposition:  The hemoglobin remains adequate on erythropoietin/iron. He will continue erythropoietin every 4 weeks. Nicholas [name] will return for an office visit in 4 months.  Betsy Coder, MD  06/17/2014  10:07 AM

## 2014-06-17 NOTE — Telephone Encounter (Signed)
Gave pt appt for lab,md and injections until december

## 2014-06-19 ENCOUNTER — Ambulatory Visit (INDEPENDENT_AMBULATORY_CARE_PROVIDER_SITE_OTHER): Payer: Medicare Other | Admitting: Podiatry

## 2014-06-19 ENCOUNTER — Encounter: Payer: Self-pay | Admitting: Podiatry

## 2014-06-19 VITALS — BP 125/76 | HR 78 | Resp 15 | Ht 70.0 in | Wt 186.0 lb

## 2014-06-19 DIAGNOSIS — Q828 Other specified congenital malformations of skin: Secondary | ICD-10-CM

## 2014-06-19 DIAGNOSIS — B351 Tinea unguium: Secondary | ICD-10-CM

## 2014-06-19 DIAGNOSIS — M79673 Pain in unspecified foot: Secondary | ICD-10-CM

## 2014-06-19 DIAGNOSIS — M79609 Pain in unspecified limb: Secondary | ICD-10-CM

## 2014-06-19 NOTE — Progress Notes (Signed)
   Subjective:    Patient ID: Nicholas [name], male    DOB: [birth year], 80 y.o.   MRN: [id number]  HPI Comments: N callous L B/L 1st MPJ plantar and left 3rd toe D and O long-term C hard painful skin A walking T hx corn medication, peels off  Pt request debridement of 10 toenails.      Review of Systems  Constitutional: Positive for appetite change and unexpected weight change.  HENT: Positive for sneezing.   Respiratory: Positive for shortness of breath.   Cardiovascular: Positive for leg swelling.  Musculoskeletal: Positive for gait problem.  Hematological: Bruises/bleeds easily.  All other systems reviewed and are negative.      Objective:   Physical Exam  Subjective: Orientated x3 white male presents with his wife  Vascular: DP and PT pulses 2/4 bilaterally  Neurological: Sensation to 10 g monofilament wire intact 4/5 bilaterally Ankle reflexes equal and reactive bilaterally Vibratory sensation nonreactive bilaterally  Dermatological: Pitting edema noted ankles the dorsal feet bilaterally The toenails are elongated, discolored, incurvated and hypertrophic x10 Nucleated plantar keratoses sub-first MPJ bilaterally Keratoses distal third left toe  Musculoskeletal: Rigid straight second right toe postop surgery Hammertoe deformities 3 -5 bilaterally       Assessment & Plan:   Assessment: Sensory neuropathy bilaterally Symptomatic onychomycoses x10 Porokeratoses x2 Keratoses x1  Plan: Nails x10 keratoses x2 debrided without a bleeding  Reappoint at three-month intervals for skin and nail debridement

## 2014-06-26 ENCOUNTER — Other Ambulatory Visit: Payer: Self-pay | Admitting: Cardiology

## 2014-06-27 ENCOUNTER — Ambulatory Visit (INDEPENDENT_AMBULATORY_CARE_PROVIDER_SITE_OTHER): Payer: Medicare Other | Admitting: *Deleted

## 2014-06-27 DIAGNOSIS — I4891 Unspecified atrial fibrillation: Secondary | ICD-10-CM

## 2014-06-27 DIAGNOSIS — Z5181 Encounter for therapeutic drug level monitoring: Secondary | ICD-10-CM

## 2014-06-27 LAB — POCT INR: INR: 4.4

## 2014-07-11 ENCOUNTER — Ambulatory Visit (INDEPENDENT_AMBULATORY_CARE_PROVIDER_SITE_OTHER): Payer: Medicare Other | Admitting: *Deleted

## 2014-07-11 ENCOUNTER — Other Ambulatory Visit: Payer: Self-pay

## 2014-07-11 DIAGNOSIS — I4891 Unspecified atrial fibrillation: Secondary | ICD-10-CM

## 2014-07-11 DIAGNOSIS — Z5181 Encounter for therapeutic drug level monitoring: Secondary | ICD-10-CM

## 2014-07-11 LAB — POCT INR: INR: 3.4

## 2014-07-11 MED ORDER — SIMVASTATIN 20 MG PO TABS: 20.0000 mg | ORAL_TABLET | Freq: Every day | ORAL | Status: AC

## 2014-07-15 ENCOUNTER — Ambulatory Visit (HOSPITAL_BASED_OUTPATIENT_CLINIC_OR_DEPARTMENT_OTHER): Payer: Medicare Other

## 2014-07-15 ENCOUNTER — Other Ambulatory Visit (HOSPITAL_BASED_OUTPATIENT_CLINIC_OR_DEPARTMENT_OTHER): Payer: Medicare Other

## 2014-07-15 VITALS — BP 134/34 | HR 58 | Temp 98.0°F

## 2014-07-15 DIAGNOSIS — N189 Chronic kidney disease, unspecified: Secondary | ICD-10-CM

## 2014-07-15 DIAGNOSIS — N179 Acute kidney failure, unspecified: Secondary | ICD-10-CM

## 2014-07-15 DIAGNOSIS — C20 Malignant neoplasm of rectum: Secondary | ICD-10-CM

## 2014-07-15 DIAGNOSIS — N289 Disorder of kidney and ureter, unspecified: Secondary | ICD-10-CM

## 2014-07-15 DIAGNOSIS — D649 Anemia, unspecified: Secondary | ICD-10-CM

## 2014-07-15 LAB — CBC WITH DIFFERENTIAL/PLATELET
BASO%: 0.7 % (ref 0.0–2.0)
Basophils Absolute: 0 10*3/uL (ref 0.0–0.1)
EOS%: 2.1 % (ref 0.0–7.0)
Eosinophils Absolute: 0.1 10*3/uL (ref 0.0–0.5)
HCT: 32.3 % — ABNORMAL LOW (ref 38.4–49.9)
HGB: 10.4 g/dL — ABNORMAL LOW (ref 13.0–17.1)
LYMPH#: 0.8 10*3/uL — AB (ref 0.9–3.3)
LYMPH%: 14 % (ref 14.0–49.0)
MCH: 33.2 pg (ref 27.2–33.4)
MCHC: 32.2 g/dL (ref 32.0–36.0)
MCV: 103.4 fL — ABNORMAL HIGH (ref 79.3–98.0)
MONO#: 0.3 10*3/uL (ref 0.1–0.9)
MONO%: 6.4 % (ref 0.0–14.0)
NEUT#: 4.2 10*3/uL (ref 1.5–6.5)
NEUT%: 76.8 % — ABNORMAL HIGH (ref 39.0–75.0)
Platelets: 103 10*3/uL — ABNORMAL LOW (ref 140–400)
RBC: 3.12 10*6/uL — AB (ref 4.20–5.82)
RDW: 14.6 % (ref 11.0–14.6)
WBC: 5.4 10*3/uL (ref 4.0–10.3)

## 2014-07-15 MED ORDER — EPOETIN ALFA 20000 UNIT/ML IJ SOLN
20000.0000 [IU] | Freq: Once | INTRAMUSCULAR | Status: AC
  Administered 2014-07-15: 20000 [IU] via SUBCUTANEOUS
  Filled 2014-07-15: qty 1

## 2014-07-25 ENCOUNTER — Ambulatory Visit (INDEPENDENT_AMBULATORY_CARE_PROVIDER_SITE_OTHER): Payer: Medicare Other | Admitting: Pharmacist

## 2014-07-25 DIAGNOSIS — I4891 Unspecified atrial fibrillation: Secondary | ICD-10-CM

## 2014-07-25 DIAGNOSIS — Z5181 Encounter for therapeutic drug level monitoring: Secondary | ICD-10-CM

## 2014-07-25 LAB — POCT INR: INR: 2.8

## 2014-08-01 ENCOUNTER — Other Ambulatory Visit: Payer: Self-pay

## 2014-08-02 ENCOUNTER — Other Ambulatory Visit: Payer: Self-pay | Admitting: *Deleted

## 2014-08-02 ENCOUNTER — Telehealth: Payer: Self-pay | Admitting: Cardiology

## 2014-08-02 MED ORDER — METOPROLOL SUCCINATE ER 25 MG PO TB24: ORAL_TABLET | ORAL | Status: DC

## 2014-08-02 NOTE — Telephone Encounter (Signed)
New message ° ° ° ° °Returning Nicholas [name]'s call  ° °

## 2014-08-02 NOTE — Telephone Encounter (Signed)
Spoke with patient and he is taking Metoprolol Succ 25 mg 1/2 tablet daily as advised at ov in January, refill sent to [facility]

## 2014-08-07 ENCOUNTER — Other Ambulatory Visit: Payer: Self-pay | Admitting: *Deleted

## 2014-08-07 MED ORDER — AMLODIPINE BESY-BENAZEPRIL HCL 5-20 MG PO CAPS: 1.0000 | ORAL_CAPSULE | Freq: Every day | ORAL | Status: DC

## 2014-08-15 ENCOUNTER — Ambulatory Visit (INDEPENDENT_AMBULATORY_CARE_PROVIDER_SITE_OTHER): Payer: Medicare Other

## 2014-08-15 ENCOUNTER — Other Ambulatory Visit: Payer: Self-pay | Admitting: *Deleted

## 2014-08-15 DIAGNOSIS — Z5181 Encounter for therapeutic drug level monitoring: Secondary | ICD-10-CM

## 2014-08-15 DIAGNOSIS — I4891 Unspecified atrial fibrillation: Secondary | ICD-10-CM

## 2014-08-15 LAB — POCT INR: INR: 2.9

## 2014-08-15 MED ORDER — FUROSEMIDE 40 MG PO TABS: 20.0000 mg | ORAL_TABLET | Freq: Every day | ORAL | Status: DC

## 2014-08-19 ENCOUNTER — Telehealth: Payer: Self-pay | Admitting: Cardiology

## 2014-08-19 MED ORDER — FUROSEMIDE 40 MG PO TABS: ORAL_TABLET | ORAL | Status: DC

## 2014-08-19 NOTE — Telephone Encounter (Signed)
New problem:    Pt needs a call back about to amount of one of his medication. Per pt he takes it 2x daily.

## 2014-08-19 NOTE — Telephone Encounter (Signed)
Spoke with patient regarding Furosemide. Patient states Rx sent for 1/2 of a 40 mg tablet daily and he has been taking 40 mg 1 to 2 tablets everyday. Discussed with  Dr. Mare Ferrari and will have patient continue what he has been doing and keep ov 09/12/14. Advised patient, verbalized understanding. New Rx sent to [facility]

## 2014-08-20 ENCOUNTER — Telehealth: Payer: Self-pay | Admitting: *Deleted

## 2014-08-20 NOTE — Telephone Encounter (Signed)
Request sent from [facility] requesting refill on Pantoprazole. Patient will need an office visit, denied.

## 2014-08-21 ENCOUNTER — Other Ambulatory Visit (HOSPITAL_BASED_OUTPATIENT_CLINIC_OR_DEPARTMENT_OTHER): Payer: Medicare Other

## 2014-08-21 ENCOUNTER — Ambulatory Visit (HOSPITAL_BASED_OUTPATIENT_CLINIC_OR_DEPARTMENT_OTHER): Payer: Medicare Other

## 2014-08-21 VITALS — BP 136/61 | HR 54 | Temp 97.8°F

## 2014-08-21 DIAGNOSIS — N179 Acute kidney failure, unspecified: Secondary | ICD-10-CM

## 2014-08-21 DIAGNOSIS — D649 Anemia, unspecified: Secondary | ICD-10-CM

## 2014-08-21 DIAGNOSIS — N289 Disorder of kidney and ureter, unspecified: Secondary | ICD-10-CM

## 2014-08-21 DIAGNOSIS — N189 Chronic kidney disease, unspecified: Secondary | ICD-10-CM

## 2014-08-21 LAB — CBC WITH DIFFERENTIAL/PLATELET
BASO%: 0.8 % (ref 0.0–2.0)
BASOS ABS: 0 10*3/uL (ref 0.0–0.1)
EOS%: 3.8 % (ref 0.0–7.0)
Eosinophils Absolute: 0.2 10*3/uL (ref 0.0–0.5)
HCT: 33.3 % — ABNORMAL LOW (ref 38.4–49.9)
HEMOGLOBIN: 10.8 g/dL — AB (ref 13.0–17.1)
LYMPH#: 0.9 10*3/uL (ref 0.9–3.3)
LYMPH%: 15.8 % (ref 14.0–49.0)
MCH: 32.7 pg (ref 27.2–33.4)
MCHC: 32.5 g/dL (ref 32.0–36.0)
MCV: 100.6 fL — ABNORMAL HIGH (ref 79.3–98.0)
MONO#: 0.5 10*3/uL (ref 0.1–0.9)
MONO%: 8.9 % (ref 0.0–14.0)
NEUT#: 3.9 10*3/uL (ref 1.5–6.5)
NEUT%: 70.7 % (ref 39.0–75.0)
Platelets: 105 10*3/uL — ABNORMAL LOW (ref 140–400)
RBC: 3.31 10*6/uL — AB (ref 4.20–5.82)
RDW: 13.4 % (ref 11.0–14.6)
WBC: 5.5 10*3/uL (ref 4.0–10.3)

## 2014-08-21 MED ORDER — EPOETIN ALFA 20000 UNIT/ML IJ SOLN
20000.0000 [IU] | Freq: Once | INTRAMUSCULAR | Status: AC
  Administered 2014-08-21: 20000 [IU] via SUBCUTANEOUS
  Filled 2014-08-21: qty 1

## 2014-08-26 ENCOUNTER — Telehealth: Payer: Self-pay | Admitting: Gastroenterology

## 2014-08-26 ENCOUNTER — Encounter: Payer: Self-pay | Admitting: Internal Medicine

## 2014-08-26 MED ORDER — PANTOPRAZOLE SODIUM 40 MG PO TBEC: 40.0000 mg | DELAYED_RELEASE_TABLET | Freq: Every day | ORAL | Status: DC

## 2014-08-26 NOTE — Telephone Encounter (Signed)
Dr. Carlean Purl,   Patient made a yearly follow up with you for November  I refilled his Pantoprazole.

## 2014-08-26 NOTE — Telephone Encounter (Signed)
It is okay to refill the pantoprazole for a year. He could wait another year for an appointment and skip this one if he wants. I reviewed the chart

## 2014-09-12 ENCOUNTER — Ambulatory Visit (INDEPENDENT_AMBULATORY_CARE_PROVIDER_SITE_OTHER): Payer: Medicare Other

## 2014-09-12 ENCOUNTER — Ambulatory Visit (INDEPENDENT_AMBULATORY_CARE_PROVIDER_SITE_OTHER): Payer: Medicare Other | Admitting: Cardiology

## 2014-09-12 ENCOUNTER — Encounter: Payer: Self-pay | Admitting: Cardiology

## 2014-09-12 VITALS — BP 166/68 | HR 64 | Ht 69.0 in | Wt 180.0 lb

## 2014-09-12 DIAGNOSIS — I25810 Atherosclerosis of coronary artery bypass graft(s) without angina pectoris: Secondary | ICD-10-CM

## 2014-09-12 DIAGNOSIS — I119 Hypertensive heart disease without heart failure: Secondary | ICD-10-CM

## 2014-09-12 DIAGNOSIS — I482 Chronic atrial fibrillation, unspecified: Secondary | ICD-10-CM

## 2014-09-12 DIAGNOSIS — E7800 Pure hypercholesterolemia, unspecified: Secondary | ICD-10-CM

## 2014-09-12 DIAGNOSIS — I4891 Unspecified atrial fibrillation: Secondary | ICD-10-CM

## 2014-09-12 DIAGNOSIS — Z5181 Encounter for therapeutic drug level monitoring: Secondary | ICD-10-CM

## 2014-09-12 DIAGNOSIS — N179 Acute kidney failure, unspecified: Secondary | ICD-10-CM

## 2014-09-12 DIAGNOSIS — N189 Chronic kidney disease, unspecified: Secondary | ICD-10-CM

## 2014-09-12 LAB — POCT INR: INR: 3.2

## 2014-09-12 NOTE — Assessment & Plan Note (Signed)
The patient is in chronic permanent atrial fibrillation.  He has not had any TIA or stroke symptoms.

## 2014-09-12 NOTE — Patient Instructions (Signed)
Your physician recommends that you continue on your current medications as directed. Please refer to the Current Medication list given to you today.  Your physician recommends that you schedule a follow-up appointment in: 3 months with fasting labs (LP/BMET/HFP/CBC)  

## 2014-09-12 NOTE — Progress Notes (Signed)
Warden Fillers Date of Birth:  Mar 15, 1934 [facility] [address] [place], [place]  [address] [phone]        Fax   [phone]   History of Present Illness: This pleasant 80 year old gentleman is seen for a followup office visit. He has a past history of ischemic heart disease and had coronary artery bypass graft surgery on 05/27/92. He has a past history of compensated congestive heart failure. He had a nuclear stress test in June 2010 showing an old inferior scar but no reversible ischemia and his ejection fraction was 51%. . When Dr. Ron Parker saw him as a work in in July 2014 his EKG showed atrial fibrillation which was new since 2013. His most recent echocardiogram on 07/31/13 showed normal systolic function with ejection fraction 55% and there was inferior wall akinesis. There was biatrial enlargement. There was mild to moderate mitral regurgitation and his pulmonary artery pressure was elevated at 46-50 mm mercury.  Since his last visit he has diuresed well and has lost 8 pounds since 05/21/14. The patient was hospitalized over Thanksgiving 2014 with acute renal failure. Because his GFR was less than 15 he was no longer able to take Xarelto and he was placed on just a baby aspirin for his atrial fibrillation. He was found to have a nonfunctioning right kidney and he had a history of passage of a kidney stone with hematuria. His creatinine was 5.3 at discharge from the hospital.  He is being followed by nephrology.  He is now on warfarin for his atrial fibrillation.  Since last visit he has been doing well.  He said no chest pain.  He does have some exertional dyspnea. He has a past history of colon cancer and has a functioning colostomy.  Current Outpatient Prescriptions  Medication Sig Dispense Refill  . amLODipine-benazepril (LOTREL) 5-20 MG per capsule Take 1 capsule by mouth daily.  30 capsule  1  . Calcium Carbonate-Vitamin D (CALCIUM 600-D) 600-400 MG-UNIT per  tablet Take 1 tablet by mouth 2 (two) times daily.       Marland Kitchen doxazosin (CARDURA) 8 MG tablet Take 8 mg by mouth at bedtime.       . DUREZOL 0.05 % EMUL       . ferrous sulfate 325 (65 FE) MG tablet Take 325 mg by mouth 2 (two) times daily with a meal.       . finasteride (PROSCAR) 5 MG tablet Take 5 mg by mouth daily.        . furosemide (LASIX) 40 MG tablet 1 to 2 tablets daily  60 tablet  11  . metoprolol succinate (TOPROL-XL) 25 MG 24 hr tablet Take 1/2 tablet daily or as directed  30 tablet  5  . nitroGLYCERIN (NITROSTAT) 0.4 MG SL tablet Place 1 tablet (0.4 mg total) under the tongue every 5 (five) minutes as needed. For chest pain  25 tablet  6  . pantoprazole (PROTONIX) 40 MG tablet Take 1 tablet (40 mg total) by mouth daily.  30 tablet  2  . Saw Palmetto, Serenoa repens, (SAW PALMETTO PO) Take 1 capsule by mouth every evening.      . simvastatin (ZOCOR) 20 MG tablet Take 1 tablet (20 mg total) by mouth at bedtime.  90 tablet  3  . VIGAMOX 0.5 % ophthalmic solution       . vitamin C (ASCORBIC ACID) 500 MG tablet Take 500 mg by mouth daily.        Marland Kitchen  Vitamins-Lipotropics (B-100 COMPLEX) TABS Take 1 tablet by mouth daily.       Marland Kitchen warfarin (COUMADIN) 3 MG tablet Take as directed by Anticoagulation clinic  40 tablet  2  . potassium chloride SA (K-DUR,KLOR-CON) 20 MEQ tablet Take 1 tablet (20 mEq total) by mouth daily.  90 tablet  3   No current facility-administered medications for this visit.    Allergies  Allergen Reactions  . Cephalexin Other (See Comments)    "too strong for my system" - per office note 05/05/2012  . Ciprofloxacin Other (See Comments)    Reaction unknown  . Hctz [Hydrochlorothiazide]     DIZZINESS   . Levofloxacin Other (See Comments)    "too strong for my system" - per office note 05/05/2012  . Lipitor [Atorvastatin Calcium]     MUSCLE PAIN  . Niaspan [Niacin Er]     FLUSHING   . Oxycodone-Acetaminophen Other (See Comments)    Reaction unknown  . Sulfonamide  Derivatives Other (See Comments)    Reaction unkonwn    Patient Active Problem List   Diagnosis Date Noted  . Hx of CABG 05/27/2011    Priority: High  . Asymptomatic PVCs 05/27/2011    Priority: High  . HYPERTENSION 05/08/2009    Priority: High  . Encounter for therapeutic drug monitoring 03/25/2014  . Acute on chronic kidney failure 11/20/2013  . Atrial fibrillation 07/10/2013  . Volume overload 07/10/2013  . Coronary artery disease   . Ejection fraction   . BPH (benign prostatic hyperplasia) 09/21/2011  . Personal history of malignant neoplasm of rectum, rectosigmoid junction, and anus 05/14/2011  . GERD (gastroesophageal reflux disease) 05/14/2011  . Hernia of abdominal cavity 05/14/2011  . HYPERCHOLESTEROLEMIA 05/08/2009  . MYOCARDIAL INFARCTION 05/08/2009  . GERD 05/08/2009  . DEGENERATIVE JOINT DISEASE, GENERALIZED 05/08/2009  . COLONIC POLYPS, ADENOMATOUS, HX OF 05/08/2009    History  Smoking status  . Former Smoker -- 10 years  . Types: Pipe, Cigars  . Quit date: 05/26/1991  Smokeless tobacco  . Current User  . Types: Chew    History  Alcohol Use No    Family History  Problem Relation Age of Onset  . Pancreatic cancer Father   . Diabetes Son   . Emphysema Mother   . Anesthesia problems Neg Hx   . Hypotension Neg Hx   . Malignant hyperthermia Neg Hx   . Pseudochol deficiency Neg Hx     Review of Systems: Constitutional: no fever chills diaphoresis or fatigue or change in weight.  Head and neck: no hearing loss, no epistaxis, no photophobia or visual disturbance. Respiratory: No cough, shortness of breath or wheezing. Cardiovascular: No chest pain peripheral edema, palpitations. Gastrointestinal: No abdominal distention, no abdominal pain, no change in bowel habits hematochezia or melena. Genitourinary: No dysuria, no frequency, no urgency, no nocturia. Musculoskeletal:No arthralgias, no back pain, no gait disturbance or myalgias. Neurological: No  dizziness, no headaches, no numbness, no seizures, no syncope, no weakness, no tremors. Hematologic: No lymphadenopathy, no easy bruising. Psychiatric: No confusion, no hallucinations, no sleep disturbance.    Physical Exam: Filed Vitals:   09/12/14 1012  BP: 166/68  Pulse: 64  The patient appears to be in no distress.  Head and neck exam reveals that the pupils are equal and reactive.  The extraocular movements are full.  There is no scleral icterus.  Mouth and pharynx are benign.  No lymphadenopathy.  No carotid bruits.  The jugular venous pressure is normal.  Thyroid is  not enlarged or tender.  Chest is clear to percussion and auscultation.  No rales or rhonchi.  Expansion of the chest is symmetrical.  Heart reveals no abnormal lift or heave.  First and second heart sounds are normal.  The pulse is irregular.  There is no murmur gallop rub or click.  The abdomen is soft and nontender.  Bowel sounds are normoactive.  There is no hepatosplenomegaly or mass.  There is a colostomy bag in the left lower quadrant.  There are no abdominal bruits.  Extremities reveal moderate bilateral pretibial and pedal edema.   Neurologic exam is normal strength and no lateralizing weakness.  No sensory deficits.  Integument reveals no rash    Assessment / Plan: 1. chronic atrial fibrillation 2. ischemic heart disease status post CABG in 1993 3. 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 4.  Past history of colon cancer with functioning colostomy. 5. hypertensive heart disease 6. hypercholesterolemia  Disposition: Continue same medication.  Recheck in 3 months for office visit CBC lipid panel hepatic function panel and basal metabolic panel.  He states that his nephrologist has stopped his potassium supplement.

## 2014-09-12 NOTE — Assessment & Plan Note (Signed)
The patient has not had any recurrent chest pain or angina. 

## 2014-09-12 NOTE — Assessment & Plan Note (Signed)
The patient is on simvastatin for his hypercholesterolemia.  He is not on any myalgias or side effects from the simvastatin.

## 2014-09-18 ENCOUNTER — Ambulatory Visit (HOSPITAL_BASED_OUTPATIENT_CLINIC_OR_DEPARTMENT_OTHER): Payer: Medicare Other

## 2014-09-18 ENCOUNTER — Other Ambulatory Visit (HOSPITAL_BASED_OUTPATIENT_CLINIC_OR_DEPARTMENT_OTHER): Payer: Medicare Other

## 2014-09-18 VITALS — BP 142/72 | HR 82 | Temp 97.8°F

## 2014-09-18 DIAGNOSIS — N189 Chronic kidney disease, unspecified: Secondary | ICD-10-CM

## 2014-09-18 DIAGNOSIS — N179 Acute kidney failure, unspecified: Secondary | ICD-10-CM

## 2014-09-18 DIAGNOSIS — D649 Anemia, unspecified: Secondary | ICD-10-CM

## 2014-09-18 DIAGNOSIS — N289 Disorder of kidney and ureter, unspecified: Secondary | ICD-10-CM

## 2014-09-18 DIAGNOSIS — Z23 Encounter for immunization: Secondary | ICD-10-CM

## 2014-09-18 LAB — CBC WITH DIFFERENTIAL/PLATELET
BASO%: 0.7 % (ref 0.0–2.0)
Basophils Absolute: 0 10*3/uL (ref 0.0–0.1)
EOS%: 2.2 % (ref 0.0–7.0)
Eosinophils Absolute: 0.1 10*3/uL (ref 0.0–0.5)
HEMATOCRIT: 33.6 % — AB (ref 38.4–49.9)
HGB: 10.7 g/dL — ABNORMAL LOW (ref 13.0–17.1)
LYMPH#: 0.7 10*3/uL — AB (ref 0.9–3.3)
LYMPH%: 12.5 % — AB (ref 14.0–49.0)
MCH: 32.1 pg (ref 27.2–33.4)
MCHC: 31.9 g/dL — ABNORMAL LOW (ref 32.0–36.0)
MCV: 100.7 fL — ABNORMAL HIGH (ref 79.3–98.0)
MONO#: 0.5 10*3/uL (ref 0.1–0.9)
MONO%: 8.8 % (ref 0.0–14.0)
NEUT#: 4.1 10*3/uL (ref 1.5–6.5)
NEUT%: 75.8 % — AB (ref 39.0–75.0)
Platelets: 96 10*3/uL — ABNORMAL LOW (ref 140–400)
RBC: 3.34 10*6/uL — ABNORMAL LOW (ref 4.20–5.82)
RDW: 13.9 % (ref 11.0–14.6)
WBC: 5.4 10*3/uL (ref 4.0–10.3)

## 2014-09-18 MED ORDER — EPOETIN ALFA 20000 UNIT/ML IJ SOLN
20000.0000 [IU] | Freq: Once | INTRAMUSCULAR | Status: AC
  Administered 2014-09-18: 20000 [IU] via SUBCUTANEOUS
  Filled 2014-09-18: qty 1

## 2014-09-18 MED ORDER — INFLUENZA VAC SPLIT QUAD 0.5 ML IM SUSY
0.5000 mL | PREFILLED_SYRINGE | Freq: Once | INTRAMUSCULAR | Status: AC
  Administered 2014-09-18: 0.5 mL via INTRAMUSCULAR
  Filled 2014-09-18: qty 0.5

## 2014-09-25 ENCOUNTER — Ambulatory Visit (INDEPENDENT_AMBULATORY_CARE_PROVIDER_SITE_OTHER): Payer: Medicare Other | Admitting: Podiatry

## 2014-09-25 ENCOUNTER — Ambulatory Visit
Admission: RE | Admit: 2014-09-25 | Discharge: 2014-09-25 | Disposition: A | Discharge status: Home / Self Care | Payer: Medicare Other | Source: Ambulatory Visit | Attending: Family Medicine | Admitting: Family Medicine

## 2014-09-25 ENCOUNTER — Other Ambulatory Visit: Payer: Self-pay | Admitting: Family Medicine

## 2014-09-25 ENCOUNTER — Encounter: Payer: Self-pay | Admitting: Podiatry

## 2014-09-25 VITALS — BP 144/69 | HR 74 | Resp 12

## 2014-09-25 DIAGNOSIS — M79609 Pain in unspecified limb: Secondary | ICD-10-CM

## 2014-09-25 DIAGNOSIS — Q828 Other specified congenital malformations of skin: Secondary | ICD-10-CM

## 2014-09-25 DIAGNOSIS — R0601 Orthopnea: Secondary | ICD-10-CM

## 2014-09-25 DIAGNOSIS — R0600 Dyspnea, unspecified: Secondary | ICD-10-CM

## 2014-09-25 DIAGNOSIS — B351 Tinea unguium: Secondary | ICD-10-CM

## 2014-09-25 DIAGNOSIS — M79676 Pain in unspecified toe(s): Secondary | ICD-10-CM

## 2014-09-26 DIAGNOSIS — Z950 Presence of cardiac pacemaker: Secondary | ICD-10-CM

## 2014-09-26 HISTORY — DX: Presence of cardiac pacemaker: Z95.0

## 2014-09-26 NOTE — Progress Notes (Signed)
Patient ID: Nicholas [name], male   DOB: 09-05-1934, 80 y.o.   MRN: [id number]  Subjective: This patient presents complaining of painful toenails and multiple skin lesions on right and left feet  Objective:  elongated, hypertrophic, incurvated toenails 6-10 Nucleated keratoses plantar first and fifth MPJ right and first MPJ left Keratoses distal third left  Assessment: Symptomatic onychomycoses 6-10 Porokeratosis x3 Keratoses x1  Plan: Nails and porokeratosis and keratoses debrided without a bleeding  Reappoint x3 months

## 2014-09-27 ENCOUNTER — Inpatient Hospital Stay (HOSPITAL_COMMUNITY): Payer: Medicare Other

## 2014-09-27 ENCOUNTER — Encounter (HOSPITAL_COMMUNITY): Payer: Self-pay | Admitting: Emergency Medicine

## 2014-09-27 ENCOUNTER — Inpatient Hospital Stay (HOSPITAL_COMMUNITY)
Admission: EM | Admit: 2014-09-27 | Discharge: 2014-10-05 | DRG: 243 | Disposition: A | Discharge status: Home / Self Care | Payer: Medicare Other | Attending: Internal Medicine | Admitting: Internal Medicine

## 2014-09-27 ENCOUNTER — Emergency Department (HOSPITAL_COMMUNITY): Payer: Medicare Other

## 2014-09-27 DIAGNOSIS — I2510 Atherosclerotic heart disease of native coronary artery without angina pectoris: Secondary | ICD-10-CM | POA: Diagnosis present

## 2014-09-27 DIAGNOSIS — E876 Hypokalemia: Secondary | ICD-10-CM | POA: Diagnosis present

## 2014-09-27 DIAGNOSIS — I5040 Unspecified combined systolic (congestive) and diastolic (congestive) heart failure: Secondary | ICD-10-CM

## 2014-09-27 DIAGNOSIS — Z933 Colostomy status: Secondary | ICD-10-CM | POA: Diagnosis not present

## 2014-09-27 DIAGNOSIS — I5033 Acute on chronic diastolic (congestive) heart failure: Secondary | ICD-10-CM | POA: Diagnosis present

## 2014-09-27 DIAGNOSIS — I495 Sick sinus syndrome: Secondary | ICD-10-CM | POA: Diagnosis present

## 2014-09-27 DIAGNOSIS — I10 Essential (primary) hypertension: Secondary | ICD-10-CM

## 2014-09-27 DIAGNOSIS — Z7901 Long term (current) use of anticoagulants: Secondary | ICD-10-CM | POA: Diagnosis not present

## 2014-09-27 DIAGNOSIS — Z951 Presence of aortocoronary bypass graft: Secondary | ICD-10-CM

## 2014-09-27 DIAGNOSIS — K219 Gastro-esophageal reflux disease without esophagitis: Secondary | ICD-10-CM | POA: Diagnosis present

## 2014-09-27 DIAGNOSIS — L89312 Pressure ulcer of right buttock, stage 2: Secondary | ICD-10-CM | POA: Diagnosis present

## 2014-09-27 DIAGNOSIS — Z85048 Personal history of other malignant neoplasm of rectum, rectosigmoid junction, and anus: Secondary | ICD-10-CM

## 2014-09-27 DIAGNOSIS — R338 Other retention of urine: Secondary | ICD-10-CM | POA: Diagnosis present

## 2014-09-27 DIAGNOSIS — I272 Other secondary pulmonary hypertension: Secondary | ICD-10-CM | POA: Diagnosis present

## 2014-09-27 DIAGNOSIS — N184 Chronic kidney disease, stage 4 (severe): Secondary | ICD-10-CM | POA: Diagnosis present

## 2014-09-27 DIAGNOSIS — Z9049 Acquired absence of other specified parts of digestive tract: Secondary | ICD-10-CM | POA: Diagnosis present

## 2014-09-27 DIAGNOSIS — Z85828 Personal history of other malignant neoplasm of skin: Secondary | ICD-10-CM | POA: Diagnosis not present

## 2014-09-27 DIAGNOSIS — I4891 Unspecified atrial fibrillation: Secondary | ICD-10-CM | POA: Diagnosis present

## 2014-09-27 DIAGNOSIS — I4729 Other ventricular tachycardia: Secondary | ICD-10-CM

## 2014-09-27 DIAGNOSIS — N401 Enlarged prostate with lower urinary tract symptoms: Secondary | ICD-10-CM | POA: Diagnosis present

## 2014-09-27 DIAGNOSIS — I252 Old myocardial infarction: Secondary | ICD-10-CM

## 2014-09-27 DIAGNOSIS — I482 Chronic atrial fibrillation, unspecified: Secondary | ICD-10-CM

## 2014-09-27 DIAGNOSIS — I25810 Atherosclerosis of coronary artery bypass graft(s) without angina pectoris: Secondary | ICD-10-CM

## 2014-09-27 DIAGNOSIS — F17220 Nicotine dependence, chewing tobacco, uncomplicated: Secondary | ICD-10-CM | POA: Diagnosis present

## 2014-09-27 DIAGNOSIS — I472 Ventricular tachycardia: Secondary | ICD-10-CM

## 2014-09-27 DIAGNOSIS — N189 Chronic kidney disease, unspecified: Secondary | ICD-10-CM

## 2014-09-27 DIAGNOSIS — I442 Atrioventricular block, complete: Secondary | ICD-10-CM | POA: Diagnosis present

## 2014-09-27 DIAGNOSIS — I5023 Acute on chronic systolic (congestive) heart failure: Secondary | ICD-10-CM | POA: Diagnosis present

## 2014-09-27 DIAGNOSIS — I129 Hypertensive chronic kidney disease with stage 1 through stage 4 chronic kidney disease, or unspecified chronic kidney disease: Secondary | ICD-10-CM | POA: Diagnosis present

## 2014-09-27 DIAGNOSIS — R531 Weakness: Secondary | ICD-10-CM

## 2014-09-27 DIAGNOSIS — Z86010 Personal history of colonic polyps: Secondary | ICD-10-CM

## 2014-09-27 DIAGNOSIS — Z85038 Personal history of other malignant neoplasm of large intestine: Secondary | ICD-10-CM

## 2014-09-27 DIAGNOSIS — Z96651 Presence of right artificial knee joint: Secondary | ICD-10-CM | POA: Diagnosis present

## 2014-09-27 DIAGNOSIS — R634 Abnormal weight loss: Secondary | ICD-10-CM | POA: Diagnosis present

## 2014-09-27 DIAGNOSIS — Z7982 Long term (current) use of aspirin: Secondary | ICD-10-CM | POA: Diagnosis not present

## 2014-09-27 DIAGNOSIS — R001 Bradycardia, unspecified: Secondary | ICD-10-CM

## 2014-09-27 DIAGNOSIS — E8770 Fluid overload, unspecified: Secondary | ICD-10-CM

## 2014-09-27 DIAGNOSIS — I4819 Other persistent atrial fibrillation: Secondary | ICD-10-CM

## 2014-09-27 DIAGNOSIS — E780 Pure hypercholesterolemia: Secondary | ICD-10-CM | POA: Diagnosis present

## 2014-09-27 DIAGNOSIS — N179 Acute kidney failure, unspecified: Secondary | ICD-10-CM

## 2014-09-27 DIAGNOSIS — N400 Enlarged prostate without lower urinary tract symptoms: Secondary | ICD-10-CM

## 2014-09-27 DIAGNOSIS — E8779 Other fluid overload: Secondary | ICD-10-CM

## 2014-09-27 DIAGNOSIS — I493 Ventricular premature depolarization: Secondary | ICD-10-CM

## 2014-09-27 HISTORY — DX: Chronic kidney disease, stage 4 (severe): N18.4

## 2014-09-27 LAB — URINALYSIS, ROUTINE W REFLEX MICROSCOPIC
Bilirubin Urine: NEGATIVE
Glucose, UA: NEGATIVE mg/dL
Ketones, ur: NEGATIVE mg/dL
Leukocytes, UA: NEGATIVE
NITRITE: NEGATIVE
PH: 5.5 (ref 5.0–8.0)
Protein, ur: NEGATIVE mg/dL
SPECIFIC GRAVITY, URINE: 1.01 (ref 1.005–1.030)
Urobilinogen, UA: 0.2 mg/dL (ref 0.0–1.0)

## 2014-09-27 LAB — TSH: TSH: 1.54 u[IU]/mL (ref 0.350–4.500)

## 2014-09-27 LAB — CBC
HEMATOCRIT: 36 % — AB (ref 39.0–52.0)
HEMOGLOBIN: 11.9 g/dL — AB (ref 13.0–17.0)
MCH: 32.2 pg (ref 26.0–34.0)
MCHC: 33.1 g/dL (ref 30.0–36.0)
MCV: 97.6 fL (ref 78.0–100.0)
Platelets: 102 10*3/uL — ABNORMAL LOW (ref 150–400)
RBC: 3.69 MIL/uL — AB (ref 4.22–5.81)
RDW: 13.9 % (ref 11.5–15.5)
WBC: 6.4 10*3/uL (ref 4.0–10.5)

## 2014-09-27 LAB — BASIC METABOLIC PANEL
ANION GAP: 13 (ref 5–15)
BUN: 22 mg/dL (ref 6–23)
CHLORIDE: 95 meq/L — AB (ref 96–112)
CO2: 35 meq/L — AB (ref 19–32)
CREATININE: 2.63 mg/dL — AB (ref 0.50–1.35)
Calcium: 10.3 mg/dL (ref 8.4–10.5)
GFR calc Af Amer: 25 mL/min — ABNORMAL LOW (ref >90)
GFR calc non Af Amer: 22 mL/min — ABNORMAL LOW (ref >90)
GLUCOSE: 104 mg/dL — AB (ref 70–99)
Potassium: 3.2 mEq/L — ABNORMAL LOW (ref 3.7–5.3)
Sodium: 143 mEq/L (ref 137–147)

## 2014-09-27 LAB — SEDIMENTATION RATE: SED RATE: 10 mm/h (ref 0–16)

## 2014-09-27 LAB — URINE MICROSCOPIC-ADD ON

## 2014-09-27 LAB — PROTIME-INR
INR: 3.6 — ABNORMAL HIGH (ref 0.00–1.49)
Prothrombin Time: 35.9 seconds — ABNORMAL HIGH (ref 11.6–15.2)

## 2014-09-27 LAB — MAGNESIUM: Magnesium: 2.1 mg/dL (ref 1.5–2.5)

## 2014-09-27 LAB — PRO B NATRIURETIC PEPTIDE: PRO B NATRI PEPTIDE: 7803 pg/mL — AB (ref 0–450)

## 2014-09-27 LAB — TROPONIN I: Troponin I: <0.3 ng/mL (ref <0.30)

## 2014-09-27 MED ORDER — ALUM & MAG HYDROXIDE-SIMETH 200-200-20 MG/5ML PO SUSP: 30.0000 mL | Freq: Four times a day (QID) | ORAL | Status: DC | PRN

## 2014-09-27 MED ORDER — AMOXICILLIN-POT CLAVULANATE 875-125 MG PO TABS
1.0000 | ORAL_TABLET | Freq: Two times a day (BID) | ORAL | Status: DC
  Administered 2014-09-27 – 2014-09-28 (×2): 1 via ORAL
  Filled 2014-09-27 (×3): qty 1

## 2014-09-27 MED ORDER — ACETAMINOPHEN 650 MG RE SUPP
650.0000 mg | Freq: Four times a day (QID) | RECTAL | Status: DC | PRN
Start: 2014-09-27 — End: 2014-10-01

## 2014-09-27 MED ORDER — METOPROLOL SUCCINATE 12.5 MG HALF TABLET
12.5000 mg | ORAL_TABLET | Freq: Every day | ORAL | Status: DC
  Filled 2014-09-27: qty 1

## 2014-09-27 MED ORDER — IOHEXOL 300 MG/ML  SOLN
25.0000 mL | INTRAMUSCULAR | Status: AC
  Administered 2014-09-27 (×2): 25 mL via ORAL

## 2014-09-27 MED ORDER — ONDANSETRON HCL 4 MG PO TABS: 4.0000 mg | ORAL_TABLET | Freq: Four times a day (QID) | ORAL | Status: DC | PRN

## 2014-09-27 MED ORDER — SODIUM CHLORIDE 0.9 % IJ SOLN
3.0000 mL | Freq: Two times a day (BID) | INTRAMUSCULAR | Status: DC
  Administered 2014-09-27 – 2014-10-01 (×8): 3 mL via INTRAVENOUS

## 2014-09-27 MED ORDER — FINASTERIDE 5 MG PO TABS
5.0000 mg | ORAL_TABLET | Freq: Every day | ORAL | Status: DC
  Administered 2014-09-28 – 2014-10-01 (×4): 5 mg via ORAL
  Filled 2014-09-27 (×6): qty 1

## 2014-09-27 MED ORDER — ACETAMINOPHEN 325 MG PO TABS
650.0000 mg | ORAL_TABLET | Freq: Four times a day (QID) | ORAL | Status: DC | PRN
  Administered 2014-09-28 – 2014-10-01 (×6): 650 mg via ORAL
  Filled 2014-09-27 (×6): qty 2

## 2014-09-27 MED ORDER — PANTOPRAZOLE SODIUM 40 MG PO TBEC
40.0000 mg | DELAYED_RELEASE_TABLET | Freq: Every day | ORAL | Status: DC
  Administered 2014-09-28 – 2014-10-05 (×8): 40 mg via ORAL
  Filled 2014-09-27 (×8): qty 1

## 2014-09-27 MED ORDER — FUROSEMIDE 10 MG/ML IJ SOLN
40.0000 mg | Freq: Once | INTRAMUSCULAR | Status: AC
  Administered 2014-09-27: 40 mg via INTRAVENOUS
  Filled 2014-09-27: qty 4

## 2014-09-27 MED ORDER — MORPHINE SULFATE 2 MG/ML IJ SOLN
1.0000 mg | INTRAMUSCULAR | Status: DC | PRN
  Administered 2014-10-01 – 2014-10-05 (×4): 1 mg via INTRAVENOUS
  Filled 2014-09-27 (×4): qty 1

## 2014-09-27 MED ORDER — WARFARIN SODIUM 3 MG PO TABS: 3.0000 mg | ORAL_TABLET | Freq: Every day | ORAL | Status: DC

## 2014-09-27 MED ORDER — DOXAZOSIN MESYLATE 8 MG PO TABS
8.0000 mg | ORAL_TABLET | Freq: Every day | ORAL | Status: DC
  Administered 2014-09-27: 8 mg via ORAL
  Filled 2014-09-27 (×2): qty 1

## 2014-09-27 MED ORDER — ONDANSETRON HCL 4 MG/2ML IJ SOLN: 4.0000 mg | Freq: Four times a day (QID) | INTRAMUSCULAR | Status: DC | PRN

## 2014-09-27 MED ORDER — WARFARIN - PHARMACIST DOSING INPATIENT
Freq: Every day | Status: DC
  Administered 2014-09-29: 19:00:00

## 2014-09-27 MED ORDER — POTASSIUM CHLORIDE CRYS ER 20 MEQ PO TBCR
40.0000 meq | EXTENDED_RELEASE_TABLET | Freq: Once | ORAL | Status: AC
  Administered 2014-09-27: 40 meq via ORAL
  Filled 2014-09-27: qty 2

## 2014-09-27 MED ORDER — HEPARIN SODIUM (PORCINE) 5000 UNIT/ML IJ SOLN: 5000.0000 [IU] | Freq: Three times a day (TID) | INTRAMUSCULAR | Status: DC

## 2014-09-27 MED ORDER — FUROSEMIDE 10 MG/ML IJ SOLN
40.0000 mg | Freq: Two times a day (BID) | INTRAMUSCULAR | Status: DC
Start: 2014-09-27 — End: 2014-09-29
  Administered 2014-09-27 – 2014-09-28 (×3): 40 mg via INTRAVENOUS
  Filled 2014-09-27 (×4): qty 4

## 2014-09-27 NOTE — ED Notes (Signed)
Dr. Wofford at bedside 

## 2014-09-27 NOTE — Consult Note (Signed)
CARDIOLOGY CONSULT NOTE   Patient ID: Nicholas [name] MRN: [id number] DOB/AGE: 06/09/1934 80 y.o.  Admit date: 09/27/2014  Primary Physician   Leonard Downing, MD Primary Cardiologist   Dr. Mare Ferrari Reason for Consultation  CHF  RKY:[id number] [name] is a 80 y.o. male with a long history of CAD, ICM, CHF, CABG '93, Atrial fib, EF 55% by echo 07/2013, ARF 10/2013 , warfarin, colon CA.   Last seen by Dr. Mare Ferrari 09/12/2014. Weight was 180 lbs. He was doing well from a cardiac standpoint.   Since his last office visit, he has noted increasing DOE, orthopnea and PND. He has a poor appetite and has not been eating very much. He has had no chest pain. He is drinking mostly water now, and does not track his weight. He has noted increasing lower extremity edema.  The weakness has been significant and has progressed fairly quickly. Over the last 2 weeks, he has become so weak that he cannot change his colostomy bag without help. Today, he could not take a shower. His wife assisted him with bathing and they came to the emergency room.  In the emergency room, he was felt to have volume overload and given Lasix 40 mg IV x1. He is also hypokalemic and was given potassium 40 mEq. He is currently not short of breath at rest, but states he gets short of breath using a urinal.   Past Medical History  Diagnosis Date  . 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 Pure hypercholesterolemia     takes Vytorin daily  . Personal history of colonic polyps     adenomatous  . Diverticulosis of colon (without mention of hemorrhage)   . Cardiovascular disease   . IHD (ischemic heart disease)   . History of colon cancer   . Colon cancer   . Hypertension     takes Amlodipine,Metoprolol,and Cardura daily  . Coronary artery disease   . Acute myocardial infarction, unspecified site, episode of care unspecified   . MI (myocardial infarction) 1993  . CHF (congestive heart failure)     takes Lasix daily    . H/O blood clots 1993  . Dysrhythmia   . Shortness of breath     with exertion  . Arthritis   . Joint pain   . Joint swelling   . Edema     to right leg-below knee;not any different than when he saw brackbill in jan 2013  . Bruises easily     takes ASA daily  . Skin cancer     nose  . GERD (gastroesophageal reflux disease)     takes Protonix daily  . Diarrhea   . Constipation   . Enlarged prostate     takes Finasteride daily  . Blood transfusion 2008  . Insomnia   . Hx of CABG     1993  . Ejection fraction     EF 51%, nuclear, 2010  . Pyelonephritis 11/20/2013  . CKD (chronic kidney disease) stage 4, GFR 15-29 ml/min      Past Surgical History  Procedure Laterality Date  . Shoulder surgery  2007    left  . Cataract extraction      right  . Abdominoperineal proctocolectomy  06/01/2007  . Coronary angioplasty  05/22/92  . Colostomy  2008  . Colonoscopy    . Coronary artery bypass graft  1993  . Colon surgery  2008    colectomy  . Knee arthroscopy  2010    right   . Total knee arthroplasty  02/28/2012    Procedure: TOTAL KNEE ARTHROPLASTY;  Surgeon: Rudean Haskell, MD;  Location: [place];  Service: Orthopedics;  Laterality: Right;    Allergies  Allergen Reactions  . Cephalexin Other (See Comments)    "too strong for my system" - per office note 05/05/2012  . Ciprofloxacin Other (See Comments)    Reaction unknown  . Hctz [Hydrochlorothiazide]     DIZZINESS   . Levofloxacin Other (See Comments)    "too strong for my system" - per office note 05/05/2012  . Lipitor [Atorvastatin Calcium]     MUSCLE PAIN  . Niaspan [Niacin Er]     FLUSHING   . Oxycodone-Acetaminophen Other (See Comments)    Reaction unknown  . Sulfonamide Derivatives Other (See Comments)    Reaction unkonwn    I have reviewed the patient's current medications Medication Sig  amLODipine-benazepril (LOTREL) 5-20 MG per capsule Take 1 capsule by mouth daily.  amoxicillin-clavulanate  (AUGMENTIN) 875-125 MG per tablet Take 1 tablet by mouth every 12 (twelve) hours.  Calcium Carbonate-Vitamin D (CALCIUM 600-D) 600-400 MG-UNIT per tablet Take 1 tablet by mouth 2 (two) times daily.   doxazosin (CARDURA) 8 MG tablet Take 8 mg by mouth at bedtime.   ferrous sulfate 325 (65 FE) MG tablet Take 325 mg by mouth 2 (two) times daily with a meal.   finasteride (PROSCAR) 5 MG tablet Take 5 mg by mouth daily.    furosemide (LASIX) 40 MG tablet Take 40-80 mg by mouth daily.  metoprolol succinate (TOPROL-XL) 25 MG 24 hr tablet Take 12.5 mg by mouth daily.  nitroGLYCERIN (NITROSTAT) 0.4 MG SL tablet Place 1 tablet (0.4 mg total) under the tongue every 5 (five) minutes as needed. For chest pain  pantoprazole (PROTONIX) 40 MG tablet Take 1 tablet (40 mg total) by mouth daily.  Saw Palmetto, Serenoa repens, (SAW PALMETTO PO) Take 1 capsule by mouth every evening.  simvastatin (ZOCOR) 20 MG tablet Take 1 tablet (20 mg total) by mouth at bedtime.  vitamin C (ASCORBIC ACID) 500 MG tablet Take 500 mg by mouth daily.    Vitamins-Lipotropics (B-100 COMPLEX) TABS Take 1 tablet by mouth daily.   warfarin (COUMADIN) 3 MG tablet Take 3-4.5 mg by mouth daily. 4.5mg  on Wednesdays all other days take 3mg      History   Social History  . Marital Status: Married    Spouse Name: N/A    Number of Children: N/A  . Years of Education: N/A   Occupational History  . retired     Warehouse manager   Social History Main Topics  . Smoking status: Former Smoker -- 10 years    Types: Pipe, Cigars    Quit date: 05/26/1991  . Smokeless tobacco: Current User    Types: Chew  . Alcohol Use: No  . Drug Use: No  . Sexual Activity: Not on file   Other Topics Concern  . Not on file   Social History Narrative   The patient lives with his wife.    Family Status  Relation Status Death Age  . Father Deceased 29  . Mother Deceased 32   Family History  Problem Relation Age of Onset  . Pancreatic cancer Father   .  Diabetes Son   . Emphysema Mother   . Anesthesia problems Neg Hx   . Hypotension Neg Hx   . Malignant hyperthermia Neg Hx   . Pseudochol deficiency Neg Hx  ROS:  Full 14 point review of systems complete and found to be negative unless listed above.  Physical Exam: Blood pressure 178/74, pulse 50, temperature 97.6 F (36.4 C), temperature source Oral, resp. rate 17, SpO2 97.00%.  General: Well developed, chronically ill appearing, elderly, male, who becomes short of breath with conversation Head: Eyes PERRLA, No xanthomas.   Normocephalic and atraumatic, oropharynx without edema or exudate. Dentition: Poor Lungs: Decreased breath sounds bases Heart: Heart irregular rate and rhythm with S1, S2; 2/6 murmur. pulses are 2+ both upper extrem.  Difficult to palpate and lower extremities to do edema Neck: No carotid bruits. No lymphadenopathy.  JVP is slightly elevated, positive hepatojugular reflux. Abdomen: Bowel sounds present, abdomen slightly distended and non-tender without masses or hernias noted. Colostomy bag noted on the left Msk:  No spine or cva tenderness. Generalized weakness, no joint deformities or effusions. Extremities: No clubbing or cyanosis. 2-3+ edema.  Neuro: Alert and oriented X 3. No focal deficits noted. Psych:  Good affect, responds appropriately Skin: No rashes or lesions noted.  Labs: Lab Results  Component Value Date   WBC 6.4 09/27/2014   HGB 11.9* 09/27/2014   HCT 36.0* 09/27/2014   MCV 97.6 09/27/2014   PLT 102* 09/27/2014    Recent Labs  09/27/14 1155  INR 3.60*     Recent Labs Lab 09/27/14 1155  NA 143  K 3.2*  CL 95*  CO2 35*  BUN 22  CREATININE 2.63*  CALCIUM 10.3  GLUCOSE 104*   Magnesium  Date Value Ref Range Status  11/23/2013 2.4  1.5 - 2.5 mg/dL Final    Recent Labs  09/27/14 1155  TROPONINI <0.30   Pro B Natriuretic peptide (BNP)  Date/Time Value Ref Range Status  09/27/2014 11:55 AM 7803.0* 0 - 450 pg/mL Final      TIBC  Date/Time Value Ref Range Status  11/23/2013  5:57 AM 204* 215 - 435 ug/dL Final     Iron  Date/Time Value Ref Range Status  11/23/2013  5:57 AM 26* 42 - 135 ug/dL Final     Retic %  Date/Time Value Ref Range Status  10/19/2013  4:35 PM 0.78* 0.80 - 1.80 % Final   Echo: 07/31/2013 Conclusion - Left ventricle: The cavity size was normal. Wall thickness was increased in a pattern of mild LVH except basal posterior wall which was thinned. The estimated ejection fraction was 55%. Basal posterior and basal inferior akinesis. Indeterminant diastolic function (atrial fibrillation). - Aortic valve: Mild regurgitation. - Mitral valve: Mild to moderate regurgitation. Possible infarct-related MR given inferoposterior wall motion abnormality. - Left atrium: The atrium was moderately dilated. - Right ventricle: The cavity size was normal. Systolic function was normal. - Right atrium: The atrium was mildly dilated. - Tricuspid valve: Peak RV-RA gradient: 46mm Hg (S). - Pulmonary arteries: PA systolic pressure 93-81 mmHg. - Systemic veins: IVC measured 2.0 cm with normal respirophasic variation, suggesting RA pressure 6-10 mmHg. Impressions: - The patient was in atrial fibrillation. Normal LV size and systolic function, EF 82%. Mild LV hypertrophy. Basal inferior and basal posterior akinesis. Mild to moderate MR, possibly infarct-related given basal inferoposterior wall motion abnormality. Normal RV size and systolic function. Mild to moderate pulmonary hypertension.  ECG:  Atrial, slow ventricular response with PVCs, sometimes bigeminy  Radiology:  Ct Head Wo Contrast 09/27/2014   CLINICAL DATA:  Increasing weakness and fatigue over the past several weeks ; right arm has a heavy feeling; history of CHF and MI and  coronary artery bypass grafting  EXAM: CT HEAD WITHOUT CONTRAST  TECHNIQUE: Contiguous axial images were obtained from the base of the skull through the vertex without  intravenous contrast.  COMPARISON:  None.  FINDINGS: There is mild age appropriate diffuse cerebral and cerebellar atrophy. The ventricles are normal in size and position. There is no intracranial hemorrhage nor intracranial mass effect. There is decreased density in the deep white matter of both cerebral hemispheres consistent with chronic small vessel ischemia.  There is postsurgical change associated with the right maxillary sinus with thickening of the lateral sinus wall. No air-fluid levels are demonstrated. The calvarium is intact.  IMPRESSION: 1. There is no acute intracranial hemorrhage nor evidence of acute ischemic change. 2. There are changes of chronic small vessel ischemia in the deep white matter of both cerebral hemispheres. There is also mild age appropriate diffuse atrophy. 3. There is no acute abnormality of the calvarium.   Electronically Signed   By: David  Martinique   On: 09/27/2014 13:37   Dg Chest Port 1 View 09/27/2014   CLINICAL DATA:  Weakness for several weeks. Progressive dyspnea. Progressive shortness of breath.  EXAM: PORTABLE CHEST - 1 VIEW  COMPARISON:  09/25/2014.  FINDINGS: Lower lung volumes with cardiomegaly. CABG. Monitoring leads project over the chest. Small RIGHT pleural effusion. Pulmonary vascular congestion. LEFT shoulder hemiarthroplasty it RIGHT shoulder osteoarthritis.  IMPRESSION: Mild CHF with small RIGHT pleural effusion.   Electronically Signed   By: Dereck Ligas M.D.   On: 09/27/2014 12:15    ASSESSMENT AND PLAN:   The patient was seen today by Dr. Tamala Julian the patient evaluated and the data reviewed.  Principal Problem:   Weakness generalized - per IM, with poor appetite and general decline raises significant concern, underlying cause unclear.  Active Problems:   Acute on chronic diastolic HF (heart failure) - had Lasix 40 mg IV in the ER, good UOP with this per patient. Unclear how much volume overload there is. Will order echo, further IV Lasix dosing per  MD. Track I/O, daily weights.     Essential hypertension - BP significantly elevated in ER, pt states not usually that high, but does not check it regularly    Atrial fibrillation - permanent, rate is slow, with PVCs, unclear if they are escape beats or from low K+, follow labs. Toprol XL decreased 25 mg to 12.5 mg daily in January 2015 because of bradycardia.     CKD, stage IV - Cr is better than it has been in the past year. Peak Cr 5.6 in November 2014 at the time of pyelonephritis with bacteremia. At that time, they found out his right kidney was atrophic, felt non-functional. Follow daily.   SignedRosaria Ferries, PA-C 09/27/2014 5:58 PM Beeper [phone]  Co-Sign MD

## 2014-09-27 NOTE — ED Notes (Signed)
Portable chest outside room

## 2014-09-27 NOTE — ED Notes (Signed)
MD at bedside. 

## 2014-09-27 NOTE — ED Notes (Signed)
Admitting at bedside. Meal tray given to pt

## 2014-09-27 NOTE — ED Notes (Signed)
Pt here with multiple complaints. Reports increased weakness x weeks. States "my right arm has been getting heavy over the last several weeks." Pt has equal grips. Pt also reports progressively worsening SOB with exertion and swelling to right leg. Pt also reports 3/10 HA, states he was recently tx for sinus infection. Hx: Colon cancer, colostomy in place.

## 2014-09-27 NOTE — ED Provider Notes (Signed)
CSN: [id number]     Arrival date & time 09/27/14  1116 History   First MD Initiated Contact with Patient 09/27/14 1140     Chief Complaint  Patient presents with  . Weakness  . Shortness of Breath     (Consider location/radiation/quality/duration/timing/severity/associated sxs/prior Treatment) HPI Comments: 80 yo male with hx of CHF and renal failure who presents with generalized weakness for past several weeks.  He also notices shortness of breath and dizziness when walking.    Patient is a 80 y.o. male presenting with weakness.  Weakness This is a new problem. Episode onset: 3 weeks. The problem occurs constantly. The problem has been gradually worsening. Associated symptoms include headaches and shortness of breath. Pertinent negatives include no chest pain and no abdominal pain. The symptoms are aggravated by walking. The symptoms are relieved by rest.    Past Medical History  Diagnosis Date  . 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 Pure hypercholesterolemia     takes Vytorin daily  . Personal history of colonic polyps     adenomatous  . Diverticulosis of colon (without mention of hemorrhage)   . Cardiovascular disease   . IHD (ischemic heart disease)   . History of colon cancer   . Colon cancer   . Hypertension     takes Amlodipine,Metoprolol,and Cardura daily  . Coronary artery disease   . Acute myocardial infarction, unspecified site, episode of care unspecified   . MI (myocardial infarction) 1993  . CHF (congestive heart failure)     takes Lasix daily  . H/O blood clots 1993  . Dysrhythmia   . Shortness of breath     with exertion  . Arthritis   . Joint pain   . Joint swelling   . Edema     to right leg-below knee;not any different than when he saw brackbill in jan 2013  . Bruises easily     takes ASA daily  . Skin cancer     nose  . GERD (gastroesophageal reflux disease)     takes Protonix daily  . Diarrhea   . Constipation   . Enlarged prostate    takes Finasteride daily  . Blood transfusion 2008  . Insomnia   . Hx of CABG     1993  . Ejection fraction     EF 51%, nuclear, 2010  . Pyelonephritis 11/20/2013   Past Surgical History  Procedure Laterality Date  . Shoulder surgery  2007    left  . Cataract extraction      right  . Abdominoperineal proctocolectomy  06/01/2007  . Coronary angioplasty  05/22/92  . Colostomy  2008  . Colonoscopy    . Coronary artery bypass graft  1993  . Colon surgery  2008    colectomy  . Knee arthroscopy  2010    right   . Total knee arthroplasty  02/28/2012    Procedure: TOTAL KNEE ARTHROPLASTY;  Surgeon: Rudean Haskell, MD;  Location: [place];  Service: Orthopedics;  Laterality: Right;   Family History  Problem Relation Age of Onset  . Pancreatic cancer Father   . Diabetes Son   . Emphysema Mother   . Anesthesia problems Neg Hx   . Hypotension Neg Hx   . Malignant hyperthermia Neg Hx   . Pseudochol deficiency Neg Hx    History  Substance Use Topics  . Smoking status: Former Smoker -- 10 years    Types: Pipe, Cigars    Quit  date: 05/26/1991  . Smokeless tobacco: Current User    Types: Chew  . Alcohol Use: No    Review of Systems  Respiratory: Positive for shortness of breath.   Cardiovascular: Negative for chest pain.  Gastrointestinal: Negative for abdominal pain.  Neurological: Positive for weakness and headaches.  All other systems reviewed and are negative.     Allergies  Cephalexin; Ciprofloxacin; Hctz; Levofloxacin; Lipitor; Niaspan; Oxycodone-acetaminophen; and Sulfonamide derivatives  Home Medications   Prior to Admission medications   Medication Sig Start Date End Date Taking? Authorizing Provider  amLODipine-benazepril (LOTREL) 5-20 MG per capsule Take 1 capsule by mouth daily. 08/07/14   Darlin Coco, MD  Calcium Carbonate-Vitamin D (CALCIUM 600-D) 600-400 MG-UNIT per tablet Take 1 tablet by mouth 2 (two) times daily.     Historical Provider, MD  doxazosin  (CARDURA) 8 MG tablet Take 8 mg by mouth at bedtime.     Historical Provider, MD  DUREZOL 0.05 % EMUL  08/07/14   Historical Provider, MD  ferrous sulfate 325 (65 FE) MG tablet Take 325 mg by mouth 2 (two) times daily with a meal.     Ladell Pier, MD  finasteride (PROSCAR) 5 MG tablet Take 5 mg by mouth daily.      Historical Provider, MD  furosemide (LASIX) 40 MG tablet 1 to 2 tablets daily 08/19/14   Darlin Coco, MD  metoprolol succinate (TOPROL-XL) 25 MG 24 hr tablet Take 1/2 tablet daily or as directed 08/02/14   Darlin Coco, MD  nitroGLYCERIN (NITROSTAT) 0.4 MG SL tablet Place 1 tablet (0.4 mg total) under the tongue every 5 (five) minutes as needed. For chest pain 02/13/13   Darlin Coco, MD  pantoprazole (PROTONIX) 40 MG tablet Take 1 tablet (40 mg total) by mouth daily. 08/26/14   Gatha Mayer, MD  potassium chloride SA (K-DUR,KLOR-CON) 20 MEQ tablet Take 1 tablet (20 mEq total) by mouth daily. 01/23/14   Darlin Coco, MD  Saw Palmetto, Serenoa repens, (SAW PALMETTO PO) Take 1 capsule by mouth every evening.    Historical Provider, MD  simvastatin (ZOCOR) 20 MG tablet Take 1 tablet (20 mg total) by mouth at bedtime. 07/11/14   Darlin Coco, MD  VIGAMOX 0.5 % ophthalmic solution  08/07/14   Historical Provider, MD  vitamin C (ASCORBIC ACID) 500 MG tablet Take 500 mg by mouth daily.      Historical Provider, MD  Vitamins-Lipotropics (B-100 COMPLEX) TABS Take 1 tablet by mouth daily.     Historical Provider, MD  warfarin (COUMADIN) 3 MG tablet Take as directed by Anticoagulation clinic 06/26/14   Darlin Coco, MD   BP 140/44  Pulse 69  Temp(Src) 97.6 F (36.4 C) (Oral)  Resp 18  SpO2 98% Physical Exam  Nursing note and vitals reviewed. Constitutional: He is oriented to person, place, and time. He appears well-developed and well-nourished. No distress.  HENT:  Head: Normocephalic and atraumatic.  Mouth/Throat: Oropharynx is clear and moist.  Eyes: Conjunctivae are  normal. Pupils are equal, round, and reactive to light. No scleral icterus.  Neck: Neck supple.  Cardiovascular: Normal rate, regular rhythm, normal heart sounds and intact distal pulses.   No murmur heard. Pulmonary/Chest: Effort normal and breath sounds normal. No stridor. No respiratory distress. He has no wheezes. He has no rales.  Abdominal: Soft. He exhibits no distension. There is tenderness (periumbilical). There is no rebound and no guarding.  Colostomy in place  Musculoskeletal: Normal range of motion. He exhibits edema (2+ BLE  pitting edema, slightly worse on right).  Neurological: He is alert and oriented to person, place, and time.  Equal grip strength  Skin: Skin is warm and dry. No rash noted.  Psychiatric: He has a normal mood and affect. His behavior is normal.    ED Course  Procedures (including critical care time) Labs Review Labs Reviewed  CBC  BASIC METABOLIC PANEL  URINALYSIS, ROUTINE W REFLEX MICROSCOPIC  TROPONIN I  PRO B NATRIURETIC PEPTIDE  PROTIME-INR    Imaging Review Ct Head Wo Contrast  09/27/2014   CLINICAL DATA:  Increasing weakness and fatigue over the past several weeks ; right arm has a heavy feeling; history of CHF and MI and coronary artery bypass grafting  EXAM: CT HEAD WITHOUT CONTRAST  TECHNIQUE: Contiguous axial images were obtained from the base of the skull through the vertex without intravenous contrast.  COMPARISON:  None.  FINDINGS: There is mild age appropriate diffuse cerebral and cerebellar atrophy. The ventricles are normal in size and position. There is no intracranial hemorrhage nor intracranial mass effect. There is decreased density in the deep white matter of both cerebral hemispheres consistent with chronic small vessel ischemia.  There is postsurgical change associated with the right maxillary sinus with thickening of the lateral sinus wall. No air-fluid levels are demonstrated. The calvarium is intact.  IMPRESSION: 1. There is no  acute intracranial hemorrhage nor evidence of acute ischemic change. 2. There are changes of chronic small vessel ischemia in the deep white matter of both cerebral hemispheres. There is also mild age appropriate diffuse atrophy. 3. There is no acute abnormality of the calvarium.   Electronically Signed   By: David  Martinique   On: 09/27/2014 13:37   Dg Chest Port 1 View  09/27/2014   CLINICAL DATA:  Weakness for several weeks. Progressive dyspnea. Progressive shortness of breath.  EXAM: PORTABLE CHEST - 1 VIEW  COMPARISON:  09/25/2014.  FINDINGS: Lower lung volumes with cardiomegaly. CABG. Monitoring leads project over the chest. Small RIGHT pleural effusion. Pulmonary vascular congestion. LEFT shoulder hemiarthroplasty it RIGHT shoulder osteoarthritis.  IMPRESSION: Mild CHF with small RIGHT pleural effusion.   Electronically Signed   By: Dereck Ligas M.D.   On: 09/27/2014 12:15  All radiology studies independently viewed by me.      EKG Interpretation   Date/Time:  Friday September 27 2014 11:25:44 EDT Ventricular Rate:  63 PR Interval:    QRS Duration: 90 QT Interval:  422 QTC Calculation: 431 R Axis:   66 Text Interpretation:  Atrial fibrillation with premature ventricular or  aberrantly conducted complexes ST \\T \ T wave abnormality, consider  inferior ischemia Abnormal ECG nonspecific ST/T changes not significantly  changed from prior.  Confirmed by [facility]  MD, TREY ([id number]) on 09/27/2014  3:46:18 PM      MDM   Final diagnoses:  Combined systolic and diastolic heart failure, unspecified heart failure chronicity    80 yo male with generalized weakness, dyspnea on exertion, mild headaches, and lightheadedness on exertion.  He is a poor and vague historian.  However, he has significant lower extremity edema, which he wife says is worse than usual.  He also has evidence of pulmonary edema.  His labs showed elevated BNP.  Creatinine was better than prior.  Overall, I suspect his malaise and  DOE are related to his CHF.  I have consulted cardiology.      Artis Delay, MD 09/28/14 [phone]

## 2014-09-27 NOTE — Consult Note (Addendum)
The patient was interviewed and examined along with Mrs. Barrett PAC. The history is difficult to obtain. There has been progression of weight loss, decreased appetite, exertional fatigue, and dyspnea over the past several months. Dyspnea on exertion has become particularly concerning over the past 2 weeks. The patient has been unable to lie down in bed. He has noticed some lower extremity swelling. Despite this history, he has lost 20 pounds over the past year. He has difficulty swallowing because the "food won't go down". He has not had chest pain.. Felt to be doing well when seen by Dr. Mare Ferrari 2 weeks ago.  On examination the patient appears in no distress. Vital signs are unremarkable with the exception of ventricular bigeminy in the setting of underlying chronic atrial fibrillation. Neck veins are relatively flat with the patient sitting at 90. Diminished breath sounds are heard at both bases. Cardiac exam reveals an irregularly irregular rhythm with occasional bigeminal pattern. He has a colostomy bag upon examination of the abdomen. There is no tenderness. Extremities reveal 1-2+ edema ankles to the mid shin.  EKG demonstrates atrial fibrillation, occasional ventricular bigeminy, and evidence of a septal infarct. BNP is 7000. Troponin is normal . Last echo LVEF 55% in 2014. Creatinine is 2.69. This is actually down from 2014 when he suffered acute on chronic kidney failure secondary to pyelonephritis.  The patient has acute on chronic heart failure, probably diastolic based upon prior echocardiogram from one year ago. Progressive weight loss, chronically ill appearance, and relatively stable clinical condition does not suggest severe acute onset heart failure. It seems as though there may be some other significant issue concurrently present.  Plan diuresis, repeat echocardiogram, replete potassium, and get general medicine involved to look for other concurrent chronic problems that may be causing  weight loss. It would be hard to imagine that he has severe systolic heart failure and secondary cardiac cachexia when LVEF was greater than 50% less than a year ago .

## 2014-09-27 NOTE — ED Notes (Signed)
Cataract Surgery 3 weeks ago.

## 2014-09-27 NOTE — Consult Note (Signed)
he patient was interviewed and examined along with Mrs. Barrett PAC. The history is difficult to obtain. There has been progression of weight loss, decreased appetite, exertional fatigue, and dyspnea over the past several months. Dyspnea on exertion has become particularly concerning over the past 2 weeks. The patient has been unable to lie down in bed. He has noticed some lower extremity swelling. Despite this history, he has lost 20 pounds over the past year. He has difficulty swallowing because the "food won't go down". He has not had chest pain.. Felt to be doing well when seen by Dr. Mare Ferrari 2 weeks ago.  On examination the patient appears in no distress. Vital signs are unremarkable with the exception of ventricular bigeminy in the setting of underlying chronic atrial fibrillation. Neck veins are relatively flat with the patient sitting at 90. Diminished breath sounds are heard at both bases. Cardiac exam reveals an irregularly irregular rhythm with occasional bigeminal pattern. He has a colostomy bag upon examination of the abdomen. There is no tenderness. Extremities reveal 1-2+ edema ankles to the mid shin.  EKG demonstrates atrial fibrillation, occasional ventricular bigeminy, and evidence of a septal infarct. BNP is 7000. Troponin is normal . Last echo LVEF 55% in 2014. Creatinine is 2.69. This is actually down from 2014 when he suffered acute on chronic kidney failure secondary to pyelonephritis.  The patient has acute on chronic heart failure, probably diastolic based upon prior echocardiogram from one year ago. Progressive weight loss, chronically ill appearance, and relatively stable clinical condition does not suggest severe acute onset heart failure. It seems as though there may be some other significant issue concurrently present.  Plan diuresis, repeat echocardiogram, replete potassium, and get general medicine involved to look for other concurrent chronic problems that may be causing  weight loss. It would be hard to imagine that he has severe systolic heart failure and secondary cardiac cachexia when LVEF was greater than 50% less than a year ago .

## 2014-09-27 NOTE — Progress Notes (Signed)
ANTICOAGULATION CONSULT NOTE - Initial Consult  Pharmacy Consult for Coumadin Indication: h/o atrial fibrillation  Allergies  Allergen Reactions  . Cephalexin Other (See Comments)    "too strong for my system" - per office note 05/05/2012  . Ciprofloxacin Other (See Comments)    Reaction unknown  . Hctz [Hydrochlorothiazide]     DIZZINESS   . Levofloxacin Other (See Comments)    "too strong for my system" - per office note 05/05/2012  . Lipitor [Atorvastatin Calcium]     MUSCLE PAIN  . Niaspan [Niacin Er]     FLUSHING   . Oxycodone-Acetaminophen Other (See Comments)    Reaction unknown  . Sulfonamide Derivatives Other (See Comments)    Reaction unkonwn    Patient Measurements: Height: 5\' 9"  (175.3 cm) Weight: 172 lb 6.4 oz (78.2 kg) IBW/kg (Calculated) : 70.7   Vital Signs: Temp: 97.3 F (36.3 C) (10/02 2019) Temp Source: Oral (10/02 2019) BP: 154/101 mmHg (10/02 2019) Pulse Rate: 85 (10/02 2019)  Labs:  Recent Labs  09/27/14 1155  HGB 11.9*  HCT 36.0*  PLT 102*  LABPROT 35.9*  INR 3.60*  CREATININE 2.63*  TROPONINI <0.30    Estimated Creatinine Clearance: 23.1 ml/min (by C-G formula based on Cr of 2.63).   Medical History: Past Medical History  Diagnosis Date  . 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 Pure hypercholesterolemia     takes Vytorin daily  . Personal history of colonic polyps     adenomatous  . Diverticulosis of colon (without mention of hemorrhage)   . Cardiovascular disease   . IHD (ischemic heart disease)   . History of colon cancer   . Colon cancer   . Hypertension     takes Amlodipine,Metoprolol,and Cardura daily  . Coronary artery disease   . Acute myocardial infarction, unspecified site, episode of care unspecified   . MI (myocardial infarction) 1993  . CHF (congestive heart failure)     takes Lasix daily  . H/O blood clots 1993  . Dysrhythmia   . Shortness of breath     with exertion  . Arthritis   . Joint pain   .  Joint swelling   . Edema     to right leg-below knee;not any different than when he saw brackbill in jan 2013  . Bruises easily     takes ASA daily  . Skin cancer     nose  . GERD (gastroesophageal reflux disease)     takes Protonix daily  . Diarrhea   . Constipation   . Enlarged prostate     takes Finasteride daily  . Blood transfusion 2008  . Insomnia   . Hx of CABG     1993  . Ejection fraction     EF 51%, nuclear, 2010  . Pyelonephritis 11/20/2013  . CKD (chronic kidney disease) stage 4, GFR 15-29 ml/min     Medications:  Prescriptions prior to admission  Medication Sig Dispense Refill  . amLODipine-benazepril (LOTREL) 5-20 MG per capsule Take 1 capsule by mouth daily.  30 capsule  1  . amoxicillin-clavulanate (AUGMENTIN) 875-125 MG per tablet Take 1 tablet by mouth every 12 (twelve) hours.      . Calcium Carbonate-Vitamin D (CALCIUM 600-D) 600-400 MG-UNIT per tablet Take 1 tablet by mouth 2 (two) times daily.       Marland Kitchen doxazosin (CARDURA) 8 MG tablet Take 8 mg by mouth at bedtime.       . ferrous sulfate 325 (  65 FE) MG tablet Take 325 mg by mouth 2 (two) times daily with a meal.       . finasteride (PROSCAR) 5 MG tablet Take 5 mg by mouth daily.        . furosemide (LASIX) 40 MG tablet Take 40-80 mg by mouth daily.      . metoprolol succinate (TOPROL-XL) 25 MG 24 hr tablet Take 12.5 mg by mouth daily.      . nitroGLYCERIN (NITROSTAT) 0.4 MG SL tablet Place 1 tablet (0.4 mg total) under the tongue every 5 (five) minutes as needed. For chest pain  25 tablet  6  . pantoprazole (PROTONIX) 40 MG tablet Take 1 tablet (40 mg total) by mouth daily.  30 tablet  2  . Saw Palmetto, Serenoa repens, (SAW PALMETTO PO) Take 1 capsule by mouth every evening.      . simvastatin (ZOCOR) 20 MG tablet Take 1 tablet (20 mg total) by mouth at bedtime.  90 tablet  3  . vitamin C (ASCORBIC ACID) 500 MG tablet Take 500 mg by mouth daily.        . Vitamins-Lipotropics (B-100 COMPLEX) TABS Take 1  tablet by mouth daily.       Marland Kitchen warfarin (COUMADIN) 3 MG tablet Take 3-4.5 mg by mouth daily. 4.5mg  on Wednesdays all other days take 3mg        Scheduled:  . amoxicillin-clavulanate  1 tablet Oral Q12H  . doxazosin  8 mg Oral QHS  . [START ON 09/28/2014] finasteride  5 mg Oral Daily  . furosemide  40 mg Intravenous BID  . [START ON 09/28/2014] metoprolol succinate  12.5 mg Oral Daily  . [START ON 09/28/2014] pantoprazole  40 mg Oral Daily  . sodium chloride  3 mL Intravenous Q12H    Assessment: 80 y.o male on chronic coumadin PTA for atrial fibrillation.  Rate is controlled with Toprol-XL. PTA dose of coumadin:   3 mg daily except 4.5 mg qWed.- last dose taken on 09/26/14.  Today on admission his INR is 3.6, SUPRAtherapeutic.  No bleeding reported. Last anticoag office visit note on 9/17 indicates INR was 3.2. At that time the patient was instructed to skip that day's dose then resume usual regimen of 3mg  daily except 4.5 mg qWed.  On 08/15/14 anticoag visit, the INR was 2.9 and on 07/25/14 the INR was 2.8 on this same dosage regimen.     The patient has a PMH of CK stage III, chronic CHF and history of colon cancer status post colectomy with colostomy and other PMH as noted above. He presented to ED today with shortness of breath about 5 days ago, worsening about every day, he also reported generalized weakness to the point he couldn't change his colostomy bag. Patient reported significant weight loss since last year. He was admitted for acute on chronic diastolic HF  CKD stage IV , has only one kidney working per pt's report, secondary to kidney stone and infection.  SCr = 2.63, baseline improved, he was 3.6 in March of 2015.   Goal of Therapy:  INR 2-3 Monitor platelets by anticoagulation protocol: Yes   Plan:  Hold coumadin today PT/INR daily.   Nicole Cella, RPh Clinical Pharmacist Pager: [phone] 09/27/2014,9:12 PM

## 2014-09-27 NOTE — ED Notes (Signed)
Attempted Report 

## 2014-09-27 NOTE — H&P (Signed)
[facility] History and Physical  [name] VHQ:[id number] DOB: 11-25-1934 DOA: 09/27/2014  Referring physician: EDP PCP: Leonard Downing, MD   Chief Complaint: Acute CHF  HPI: Nicholas [name] is a 80 y.o. male with past medical history of CK stage III, chronic CHF and history of colon cancer status post colectomy with colostomy. Patient came in to the hospital because of shortness of breath. Patient started to have shortness of breath about 5 days ago, worsening about every day, he also reported generalized weakness to the point he couldn't change his colostomy bag. Patient reported significant weight loss since last year. His wife brought him to the hospital for further evaluation. In the ED he was found to have creatinine of 2.6, baseline creatinine is 3.6 from March of 2015, his weight is 180 patient weighed 230 lbs in July of 2014. Patient seen by cardiology in the emergency department are recommended admission to the hospitalist team.  Review of Systems:  Constitutional: negative for anorexia, fevers and sweats Eyes: negative for irritation, redness and visual disturbance Ears, nose, mouth, throat, and face: negative for earaches, epistaxis, nasal congestion and sore throat Respiratory: negative for cough, dyspnea on exertion, sputum and wheezing Cardiovascular: Shortness of breath, dyspnea on exertion and orthopnea Gastrointestinal: negative for abdominal pain, constipation, diarrhea, melena, nausea and vomiting Genitourinary:negative for dysuria, frequency and hematuria Hematologic/lymphatic: negative for bleeding, easy bruising and lymphadenopathy Musculoskeletal:negative for arthralgias, muscle weakness and stiff joints Neurological: negative for coordination problems, gait problems, headaches and weakness Endocrine: negative for diabetic symptoms including polydipsia, polyuria and weight loss Allergic/Immunologic: negative for anaphylaxis, hay fever and  urticaria  Past Medical History  Diagnosis Date  . 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 Pure hypercholesterolemia     takes Vytorin daily  . Personal history of colonic polyps     adenomatous  . Diverticulosis of colon (without mention of hemorrhage)   . Cardiovascular disease   . IHD (ischemic heart disease)   . History of colon cancer   . Colon cancer   . Hypertension     takes Amlodipine,Metoprolol,and Cardura daily  . Coronary artery disease   . Acute myocardial infarction, unspecified site, episode of care unspecified   . MI (myocardial infarction) 1993  . CHF (congestive heart failure)     takes Lasix daily  . H/O blood clots 1993  . Dysrhythmia   . Shortness of breath     with exertion  . Arthritis   . Joint pain   . Joint swelling   . Edema     to right leg-below knee;not any different than when he saw brackbill in jan 2013  . Bruises easily     takes ASA daily  . Skin cancer     nose  . GERD (gastroesophageal reflux disease)     takes Protonix daily  . Diarrhea   . Constipation   . Enlarged prostate     takes Finasteride daily  . Blood transfusion 2008  . Insomnia   . Hx of CABG     1993  . Ejection fraction     EF 51%, nuclear, 2010  . Pyelonephritis 11/20/2013  . CKD (chronic kidney disease) stage 4, GFR 15-29 ml/min    Past Surgical History  Procedure Laterality Date  . Shoulder surgery  2007    left  . Cataract extraction      right  . Abdominoperineal proctocolectomy  06/01/2007  . Coronary angioplasty  05/22/92  . Colostomy  2008  . Colonoscopy    . Coronary artery bypass graft  1993  . Colon surgery  2008    colectomy  . Knee arthroscopy  2010    right   . Total knee arthroplasty  02/28/2012    Procedure: TOTAL KNEE ARTHROPLASTY;  Surgeon: Rudean Haskell, MD;  Location: [facility];  Service: Orthopedics;  Laterality: Right;   Social History:   reports that he quit smoking about 23 years ago. His smoking use included Pipe and Cigars. His  smokeless tobacco use includes Chew. He reports that he does not drink alcohol or use illicit drugs.  Allergies  Allergen Reactions  . Cephalexin Other (See Comments)    "too strong for my system" - per office note 05/05/2012  . Ciprofloxacin Other (See Comments)    Reaction unknown  . Hctz [Hydrochlorothiazide]     DIZZINESS   . Levofloxacin Other (See Comments)    "too strong for my system" - per office note 05/05/2012  . Lipitor [Atorvastatin Calcium]     MUSCLE PAIN  . Niaspan [Niacin Er]     FLUSHING   . Oxycodone-Acetaminophen Other (See Comments)    Reaction unknown  . Sulfonamide Derivatives Other (See Comments)    Reaction unkonwn    Family History  Problem Relation Age of Onset  . Pancreatic cancer Father   . Diabetes Son   . Emphysema Mother   . Anesthesia problems Neg Hx   . Hypotension Neg Hx   . Malignant hyperthermia Neg Hx   . Pseudochol deficiency Neg Hx      Prior to Admission medications   Medication Sig Start Date End Date Taking? Authorizing Provider  amLODipine-benazepril (LOTREL) 5-20 MG per capsule Take 1 capsule by mouth daily. 08/07/14  Yes Darlin Coco, MD  amoxicillin-clavulanate (AUGMENTIN) 875-125 MG per tablet Take 1 tablet by mouth every 12 (twelve) hours.   Yes Historical Provider, MD  Calcium Carbonate-Vitamin D (CALCIUM 600-D) 600-400 MG-UNIT per tablet Take 1 tablet by mouth 2 (two) times daily.    Yes Historical Provider, MD  doxazosin (CARDURA) 8 MG tablet Take 8 mg by mouth at bedtime.    Yes Historical Provider, MD  ferrous sulfate 325 (65 FE) MG tablet Take 325 mg by mouth 2 (two) times daily with a meal.    Yes Ladell Pier, MD  finasteride (PROSCAR) 5 MG tablet Take 5 mg by mouth daily.     Yes Historical Provider, MD  furosemide (LASIX) 40 MG tablet Take 40-80 mg by mouth daily.   Yes Historical Provider, MD  metoprolol succinate (TOPROL-XL) 25 MG 24 hr tablet Take 12.5 mg by mouth daily.   Yes Historical Provider, MD   nitroGLYCERIN (NITROSTAT) 0.4 MG SL tablet Place 1 tablet (0.4 mg total) under the tongue every 5 (five) minutes as needed. For chest pain 02/13/13  Yes Darlin Coco, MD  pantoprazole (PROTONIX) 40 MG tablet Take 1 tablet (40 mg total) by mouth daily. 08/26/14  Yes Gatha Mayer, MD  Saw Palmetto, Serenoa repens, (SAW PALMETTO PO) Take 1 capsule by mouth every evening.   Yes Historical Provider, MD  simvastatin (ZOCOR) 20 MG tablet Take 1 tablet (20 mg total) by mouth at bedtime. 07/11/14  Yes Darlin Coco, MD  vitamin C (ASCORBIC ACID) 500 MG tablet Take 500 mg by mouth daily.     Yes Historical Provider, MD  Vitamins-Lipotropics (B-100 COMPLEX) TABS Take 1 tablet by mouth daily.    Yes Historical Provider, MD  warfarin (COUMADIN) 3 MG tablet Take 3-4.5 mg by mouth daily. 4.5mg  on Wednesdays all other days take 3mg    Yes Historical Provider, MD   Physical Exam: Filed Vitals:   09/27/14 1745  BP: 178/74  Pulse: 50  Temp:   Resp: 17   Constitutional: Oriented to person, place, and time. Well-developed and well-nourished. Cooperative.  Head: Normocephalic and atraumatic.  Nose: Nose normal.  Mouth/Throat: Uvula is midline, oropharynx is clear and moist and mucous membranes are normal.  Eyes: Conjunctivae and EOM are normal. Pupils are equal, round, and reactive to light.  Neck: Trachea normal and normal range of motion. Neck supple.  Cardiovascular: Normal rate, regular rhythm, S1 normal, S2 normal, normal heart sounds and intact distal pulses.   Pulmonary/Chest: Effort normal and breath sounds normal.  Abdominal: Soft. Bowel sounds are normal. There is no hepatosplenomegaly. There is no tenderness.  Musculoskeletal: Normal range of motion.  Neurological: Alert and oriented to person, place, and time. Has normal strength. No cranial nerve deficit or sensory deficit.  Skin: Skin is warm, dry and intact.  Psychiatric: Has a normal mood and affect. Speech is normal and behavior is  normal.   Labs on Admission:  Basic Metabolic Panel:  Recent Labs Lab 09/27/14 1155  NA 143  K 3.2*  CL 95*  CO2 35*  GLUCOSE 104*  BUN 22  CREATININE 2.63*  CALCIUM 10.3   Liver Function Tests: No results found for this basename: AST, ALT, ALKPHOS, BILITOT, PROT, ALBUMIN,  in the last 168 hours No results found for this basename: LIPASE, AMYLASE,  in the last 168 hours No results found for this basename: AMMONIA,  in the last 168 hours CBC:  Recent Labs Lab 09/27/14 1155  WBC 6.4  HGB 11.9*  HCT 36.0*  MCV 97.6  PLT 102*   Cardiac Enzymes:  Recent Labs Lab 09/27/14 1155  TROPONINI <0.30    BNP (last 3 results)  Recent Labs  09/27/14 1155  PROBNP 7803.0*   CBG: No results found for this basename: GLUCAP,  in the last 168 hours  Radiological Exams on Admission: Ct Head Wo Contrast  09/27/2014   CLINICAL DATA:  Increasing weakness and fatigue over the past several weeks ; right arm has a heavy feeling; history of CHF and MI and coronary artery bypass grafting  EXAM: CT HEAD WITHOUT CONTRAST  TECHNIQUE: Contiguous axial images were obtained from the base of the skull through the vertex without intravenous contrast.  COMPARISON:  None.  FINDINGS: There is mild age appropriate diffuse cerebral and cerebellar atrophy. The ventricles are normal in size and position. There is no intracranial hemorrhage nor intracranial mass effect. There is decreased density in the deep white matter of both cerebral hemispheres consistent with chronic small vessel ischemia.  There is postsurgical change associated with the right maxillary sinus with thickening of the lateral sinus wall. No air-fluid levels are demonstrated. The calvarium is intact.  IMPRESSION: 1. There is no acute intracranial hemorrhage nor evidence of acute ischemic change. 2. There are changes of chronic small vessel ischemia in the deep white matter of both cerebral hemispheres. There is also mild age appropriate  diffuse atrophy. 3. There is no acute abnormality of the calvarium.   Electronically Signed   By: David  Martinique   On: 09/27/2014 13:37   Dg Chest Port 1 View  09/27/2014   CLINICAL DATA:  Weakness for several weeks. Progressive dyspnea. Progressive shortness of breath.  EXAM: PORTABLE CHEST - 1 VIEW  COMPARISON:  09/25/2014.  FINDINGS: Lower lung volumes with cardiomegaly. CABG. Monitoring leads project over the chest. Small RIGHT pleural effusion. Pulmonary vascular congestion. LEFT shoulder hemiarthroplasty it RIGHT shoulder osteoarthritis.  IMPRESSION: Mild CHF with small RIGHT pleural effusion.   Electronically Signed   By: Dereck Ligas M.D.   On: 09/27/2014 12:15    EKG: Independently reviewed.   Assessment/Plan Principal Problem:   Acute on chronic diastolic HF (heart failure) Active Problems:   Essential hypertension   Atrial fibrillation   Weakness generalized   CKD (chronic kidney disease) stage 4, GFR 15-29 ml/min    Acute on chronic CHF -Likely acute on chronic diastolic CHF, patient seen by cardiology in the emergency department. -Restrict fluids to 1500 cc, low sodium diet. -Start diuresis with Lasix 40 mg twice a day, follow intake and output. -Follow renal function closely because of solitary kidney and chronic kidney disease. -2-D echocardiogram  Weight loss -Patient weighed 230 in July 2014, current weight is 180. -His actual weight is likely less than this after diuresis. -Check TSH, has history of colon cancer, check CEA and PSA for age appropriate screening.  CKD stage IV -Patient reports that he has only one kidney working, secondary to kidney stone and infection. -Creatinine is 2.6, baseline improved, he was 3.6 in March of 2015. -Hold benazepril.  Atrial fibrillation -Rate is controlled with Toprol-XL. -Patient is on Coumadin, INR is 3.6, pharmacy to dose the Coumadin.  Code Status: Full code Family Communication: *Plan discussed with the patient in  the presence of his wife at bedside. Disposition Plan: Inpatient, telemetry  Time spent: 70 minutes  [facility] Pager [phone]

## 2014-09-27 NOTE — ED Provider Notes (Signed)
Cards saw Pt in ED and recs Med admit with Cards consult; Int Med unassigned paged. [place], MD 09/30/14 [phone]

## 2014-09-27 NOTE — ED Notes (Signed)
I walked patient he did well his oxygen level stayed at 97 room air and 65 heart rate

## 2014-09-28 DIAGNOSIS — I481 Persistent atrial fibrillation: Secondary | ICD-10-CM

## 2014-09-28 DIAGNOSIS — Z951 Presence of aortocoronary bypass graft: Secondary | ICD-10-CM

## 2014-09-28 DIAGNOSIS — I10 Essential (primary) hypertension: Secondary | ICD-10-CM

## 2014-09-28 DIAGNOSIS — I059 Rheumatic mitral valve disease, unspecified: Secondary | ICD-10-CM

## 2014-09-28 LAB — COMPREHENSIVE METABOLIC PANEL
ALT: 12 U/L (ref 0–53)
ANION GAP: 9 (ref 5–15)
AST: 20 U/L (ref 0–37)
Albumin: 3.5 g/dL (ref 3.5–5.2)
Alkaline Phosphatase: 65 U/L (ref 39–117)
BUN: 23 mg/dL (ref 6–23)
CALCIUM: 9.7 mg/dL (ref 8.4–10.5)
CO2: 37 mEq/L — ABNORMAL HIGH (ref 19–32)
Chloride: 96 mEq/L (ref 96–112)
Creatinine, Ser: 2.65 mg/dL — ABNORMAL HIGH (ref 0.50–1.35)
GFR calc non Af Amer: 22 mL/min — ABNORMAL LOW (ref >90)
GFR, EST AFRICAN AMERICAN: 25 mL/min — AB (ref >90)
GLUCOSE: 80 mg/dL (ref 70–99)
POTASSIUM: 3.2 meq/L — AB (ref 3.7–5.3)
SODIUM: 142 meq/L (ref 137–147)
TOTAL PROTEIN: 6.1 g/dL (ref 6.0–8.3)
Total Bilirubin: 1.1 mg/dL (ref 0.3–1.2)

## 2014-09-28 LAB — CEA: CEA: 0.6 ng/mL (ref 0.0–5.0)

## 2014-09-28 LAB — PSA: PSA: 1.29 ng/mL (ref <=4.00)

## 2014-09-28 LAB — CBC
HCT: 32.7 % — ABNORMAL LOW (ref 39.0–52.0)
Hemoglobin: 10.7 g/dL — ABNORMAL LOW (ref 13.0–17.0)
MCH: 31.9 pg (ref 26.0–34.0)
MCHC: 32.7 g/dL (ref 30.0–36.0)
MCV: 97.6 fL (ref 78.0–100.0)
Platelets: 86 10*3/uL — ABNORMAL LOW (ref 150–400)
RBC: 3.35 MIL/uL — AB (ref 4.22–5.81)
RDW: 14 % (ref 11.5–15.5)
WBC: 6.6 10*3/uL (ref 4.0–10.5)

## 2014-09-28 LAB — VITAMIN B12: Vitamin B-12: 664 pg/mL (ref 211–911)

## 2014-09-28 LAB — PROTIME-INR
INR: 4 — AB (ref 0.00–1.49)
PROTHROMBIN TIME: 39 s — AB (ref 11.6–15.2)

## 2014-09-28 MED ORDER — ENSURE COMPLETE PO LIQD
237.0000 mL | ORAL | Status: DC
  Administered 2014-10-02 – 2014-10-05 (×3): 237 mL via ORAL
  Filled 2014-09-28 (×3): qty 237

## 2014-09-28 MED ORDER — POTASSIUM CHLORIDE CRYS ER 20 MEQ PO TBCR
40.0000 meq | EXTENDED_RELEASE_TABLET | Freq: Two times a day (BID) | ORAL | Status: AC
  Administered 2014-09-28 (×2): 40 meq via ORAL
  Filled 2014-09-28 (×2): qty 2

## 2014-09-28 MED ORDER — AMOXICILLIN-POT CLAVULANATE 500-125 MG PO TABS
500.0000 mg | ORAL_TABLET | Freq: Two times a day (BID) | ORAL | Status: DC
  Administered 2014-09-28 – 2014-10-04 (×12): 500 mg via ORAL
  Filled 2014-09-28 (×15): qty 1

## 2014-09-28 MED ORDER — ENSURE PUDDING PO PUDG
1.0000 | ORAL | Status: DC
  Administered 2014-10-02 – 2014-10-05 (×3): 1 via ORAL
  Filled 2014-09-28 (×6): qty 1

## 2014-09-28 NOTE — Progress Notes (Signed)
The patient's HR dropped to 29, unsustained.  His HR jumped back to the 40s-50s quickly.  He was asleep and stated that he was asymptomatic upon waking.  TRH and on-call cardiologist notified.  The RN will continue to monitor the patient's HR and rhythm.

## 2014-09-28 NOTE — Progress Notes (Signed)
Patient's HR went into the 30s sustained for a few minutes with a range between 28-42.  The patient was asleep and stated that he was asymptomatic.  Blood pressure was stable and charted.  His HR went back up into the 50-60s upon waking.  Dr. Colon Flattery notified.

## 2014-09-28 NOTE — Progress Notes (Signed)
MD notified about HR decreasing. Patient continues to be asymptomatic. No new orders given at this time. Will continue to monitor patient for further changes in condition.

## 2014-09-28 NOTE — Progress Notes (Signed)
[facility] PROGRESS NOTE Interim History: 80 y.o. male with past medical history of CK stage III, chronic CHF and history of colon cancer status post colectomy with colostomy. Patient came in to the hospital because of shortness of breath. Patient started to have shortness of breath about 5 days ago, worsening about every day, he also reported generalized weakness to the point he couldn't change his colostomy bag. Patient reported significant weight loss since last year. His wife brought him to the hospital for further evaluation.  In the ED he was found to have creatinine of 2.6, baseline creatinine is 3.6 from March of 2015, his weight is 180 patient weighed 230 lbs in July of 2014.   Filed Weights   09/27/14 1900 09/28/14 0457  Weight: 78.2 kg (172 lb 6.4 oz) 77.474 kg (170 lb 12.8 oz)        Intake/Output Summary (Last 24 hours) at 09/28/14 0735 Last data filed at 09/28/14 0318  Gross per 24 hour  Intake    240 ml  Output   1325 ml  Net  -1085 ml     Assessment/Plan: Acute on chronic diastolic HF (heart failure)/Acute respiratory failure: - Unclear weather his SOB is due all to heart failure. - Started on IV Lasix with moderate diuresis. Echo pending. - Strict I and O's. Monitor electrolytes. NO JVD on physical exam, 3+ edema. - fluid restriction. Weight is better than on previous admission, albumin is 3.5. -  Weight on admission 78 kg now 77.4 kg.  Weakness generalized/weight loss: - Pt consult. Patient weighed 230 in July 2014, current weight is 180 - ensure TID. - TSH 1.5. PSA 1.2, CEA 0.6.  CKD (chronic kidney disease) stage 4, GFR 15-29 ml/min - Baseline Cr 2.6-3.0. - cont to hold benazepril.  Atrial fibrillation - Rate controlled. - INR supra therapeutic. - coumadin per pharmacy.  Essential hypertension - mildly elevated     Code Status: Full code  Family Communication: Plan discussed with the patient in the presence of his wife at bedside.    Disposition Plan: Inpatient, telemetry    Consultants:  cardiology  Procedures: ECHO: pending  Antibiotics:  None  HPI/Subjective: SOB mildly improved.  Objective: Filed Vitals:   09/27/14 2019 09/28/14 0003 09/28/14 0457 09/28/14 0507  BP: 154/101 136/43 137/44 147/85  Pulse: 85  90   Temp: 97.3 F (36.3 C)  97.4 F (36.3 C)   TempSrc: Oral  Oral   Resp: 18  18   Height:      Weight:   77.474 kg (170 lb 12.8 oz)   SpO2: 94%  99%      Exam:  General: Alert, awake, oriented x3, in no acute distress.  HEENT: No bruits, no goiter. -JVD Heart: irregular rate and rhythm. 3+ edema. Lungs: Good air movement, clear Abdomen: Soft, nontender, nondistended, positive bowel sounds.   Data Reviewed: Basic Metabolic Panel:  Recent Labs Lab 09/27/14 1155 09/27/14 1814 09/28/14 0345  NA 143  --  142  K 3.2*  --  3.2*  CL 95*  --  96  CO2 35*  --  37*  GLUCOSE 104*  --  80  BUN 22  --  23  CREATININE 2.63*  --  2.65*  CALCIUM 10.3  --  9.7  MG  --  2.1  --    Liver Function Tests:  Recent Labs Lab 09/28/14 0345  AST 20  ALT 12  ALKPHOS 65  BILITOT 1.1  PROT 6.1  ALBUMIN  3.5   No results found for this basename: LIPASE, AMYLASE,  in the last 168 hours No results found for this basename: AMMONIA,  in the last 168 hours CBC:  Recent Labs Lab 09/27/14 1155 09/28/14 0345  WBC 6.4 6.6  HGB 11.9* 10.7*  HCT 36.0* 32.7*  MCV 97.6 97.6  PLT 102* 86*   Cardiac Enzymes:  Recent Labs Lab 09/27/14 1155  TROPONINI <0.30   BNP (last 3 results)  Recent Labs  09/27/14 1155  PROBNP 7803.0*   CBG: No results found for this basename: GLUCAP,  in the last 168 hours  No results found for this or any previous visit (from the past 240 hour(s)).   Studies: Ct Abdomen Pelvis Wo Contrast  09/27/2014   CLINICAL DATA:  Weight loss. History of rectal and colon cancer post colostomy.  EXAM: CT ABDOMEN AND PELVIS WITHOUT CONTRAST  TECHNIQUE: Multidetector CT  imaging of the abdomen and pelvis was performed following the standard protocol without IV contrast.  COMPARISON:  11/20/2013  FINDINGS: Technically limited study due to motion artifact and streak artifact.  Bilateral pleural effusions, greater on the right. Atelectasis in the lung bases. Postoperative changes in the mediastinum. Calcification of the native Coronary arteries.  The unenhanced appearance of the liver, spleen, gallbladder, pancreas, adrenal glands, inferior vena cava, and retroperitoneal lymph nodes is unremarkable. Extensive calcification throughout the abdominal aorta without aneurysm. Bilateral renal atrophy, greater on the right. Low-attenuation mass lesions are arising from the upper pole of the left kidney likely representing cysts. No significant change in size since previous study. No hydronephrosis or hydroureter. Stomach, small bowel, and colon are not abnormally distended. Sigmoid colonic resection with left lower quadrant colostomy. There is of peristomal hernia containing small bowel and colon. There is an additional ventral anterior abdominal wall hernia containing colon. Contrast material passes through this area suggesting no evidence of obstruction. No free air or free fluid in the abdomen.  Pelvis: Postoperative changes in the pelvis with rectal resection. Soft tissue thickening and infiltration in the presacral space appears unchanged since previous study, likely postoperative. Small amount of free fluid in the pelvis is nonspecific. Prostate gland is enlarged. Bladder wall is not thickened. Spondylolysis with mild spondylolisthesis at L5-S1. Diffuse degenerative changes throughout the cervical spine. No destructive bone lesions appreciated.  IMPRESSION: Surgical resection of the rectosigmoid colon with left lower quadrant colostomy. Peristomal and anterior abdominal wall hernias containing colon and small bowel without evidence of obstruction. Small amount of free fluid in the  pelvis is nonspecific. Bilateral renal atrophy with probable left renal cysts. Bilateral pleural effusions with basilar atelectasis.   Electronically Signed   By: Lucienne Capers M.D.   On: 09/27/2014 23:36   Ct Head Wo Contrast  09/27/2014   CLINICAL DATA:  Increasing weakness and fatigue over the past several weeks ; right arm has a heavy feeling; history of CHF and MI and coronary artery bypass grafting  EXAM: CT HEAD WITHOUT CONTRAST  TECHNIQUE: Contiguous axial images were obtained from the base of the skull through the vertex without intravenous contrast.  COMPARISON:  None.  FINDINGS: There is mild age appropriate diffuse cerebral and cerebellar atrophy. The ventricles are normal in size and position. There is no intracranial hemorrhage nor intracranial mass effect. There is decreased density in the deep white matter of both cerebral hemispheres consistent with chronic small vessel ischemia.  There is postsurgical change associated with the right maxillary sinus with thickening of the lateral sinus wall. No  air-fluid levels are demonstrated. The calvarium is intact.  IMPRESSION: 1. There is no acute intracranial hemorrhage nor evidence of acute ischemic change. 2. There are changes of chronic small vessel ischemia in the deep white matter of both cerebral hemispheres. There is also mild age appropriate diffuse atrophy. 3. There is no acute abnormality of the calvarium.   Electronically Signed   By: David  Martinique   On: 09/27/2014 13:37   Dg Chest Port 1 View  09/27/2014   CLINICAL DATA:  Weakness for several weeks. Progressive dyspnea. Progressive shortness of breath.  EXAM: PORTABLE CHEST - 1 VIEW  COMPARISON:  09/25/2014.  FINDINGS: Lower lung volumes with cardiomegaly. CABG. Monitoring leads project over the chest. Small RIGHT pleural effusion. Pulmonary vascular congestion. LEFT shoulder hemiarthroplasty it RIGHT shoulder osteoarthritis.  IMPRESSION: Mild CHF with small RIGHT pleural effusion.    Electronically Signed   By: Dereck Ligas M.D.   On: 09/27/2014 12:15    Scheduled Meds: . amoxicillin-clavulanate  1 tablet Oral Q12H  . doxazosin  8 mg Oral QHS  . finasteride  5 mg Oral Daily  . furosemide  40 mg Intravenous BID  . metoprolol succinate  12.5 mg Oral Daily  . pantoprazole  40 mg Oral Daily  . sodium chloride  3 mL Intravenous Q12H  . Warfarin - Pharmacist Dosing Inpatient   Does not apply q1800   Continuous Infusions:    Charlynne Cousins  [facility] Pager [phone]  If 8PM-8AM, please contact night-coverage at [email or web address], password [facility] 09/28/2014, 7:35 AM  LOS: 1 day     **Disclaimer: This note may have been dictated with voice recognition software. Similar sounding words can inadvertently be transcribed and this note may contain transcription errors which may not have been corrected upon publication of note.**

## 2014-09-28 NOTE — Progress Notes (Signed)
The patient's HR was briefly, but frequently dropping into the 30s.  He is in A. Fib.  He was throwing frequent PVCs with runs of bigeminy at times and multifocal PVCs at other times. The patient stated that he was asymptomatic and his VS are stable and charted.  His BP is 136/43.  Both Jonette Eva and the cardiologist on-call were notified.  The RN will continue to monitor the patient's rate and rhythm.

## 2014-09-28 NOTE — Progress Notes (Signed)
Made cardiologist aware of patient's HR decreasing into the high 20s and low 30s. Patient is due for metoprolol at this time. Ok to hold med and med has been further D/C'd. Patient is asymptomatic. Will continue to monitor patient for further changes in condition.

## 2014-09-28 NOTE — Evaluation (Signed)
Physical Therapy Evaluation Patient Details Name: Nicholas [name] MRN: [id number] DOB: [birth year] Today's Date: 09/28/2014   History of Present Illness  Pt is a 80 y.o. male with past medical history of CK stage III, chronic CHF and history of colon cancer status post colectomy with colostomy. Patient came in to the hospital because of SOB. Patient started to have SOB about 5 days ago, worsening about every day, he also reported generalized weakness to the point he couldn't change his colostomy bag. Patient reported significant weight loss since last year. His wife brought him to the hospital for further evaluation.  Clinical Impression  Pt admitted with the above. Pt currently with functional limitations due to the deficits listed below (see PT Problem List). At the time of PT eval pt was able to perform transfers and ambulation with min guard assist, limited by O2 sats on RA. Pt will benefit from skilled PT to increase their independence and safety with mobility to allow discharge to the venue listed below.       Follow Up Recommendations Home health PT;Supervision for mobility/OOB    Equipment Recommendations  None recommended by PT    Recommendations for Other Services       Precautions / Restrictions Precautions Precautions: Fall Restrictions Weight Bearing Restrictions: No      Mobility  Bed Mobility Overal bed mobility: Needs Assistance Bed Mobility: Supine to Sit     Supine to sit: Min guard     General bed mobility comments: Pt requires increased time and use of bed rails for assist. Min guard for elevation of trunk to full sitting position.   Transfers Overall transfer level: Needs assistance Equipment used: Rolling walker (2 wheeled) Transfers: Sit to/from Stand Sit to Stand: Min guard         General transfer comment: Pt was cued for hand placement on seated surface for safety.   Ambulation/Gait Ambulation/Gait assistance: Min guard Ambulation Distance  (Feet): 25 Feet Assistive device: Rolling walker (2 wheeled) Gait Pattern/deviations: Step-through pattern;Decreased stride length;Trunk flexed;Narrow base of support Gait velocity: Decreased Gait velocity interpretation: Below normal speed for age/gender General Gait Details: Pt ambulated in room ~25 feet on RA with RW. Sats decreased from 95% to 82% and supplemental O2 again donned.   Stairs            Wheelchair Mobility    Modified Rankin (Stroke Patients Only)       Balance Overall balance assessment: Needs assistance Sitting-balance support: Feet supported;No upper extremity supported Sitting balance-Leahy Scale: Fair     Standing balance support: Bilateral upper extremity supported;During functional activity Standing balance-Leahy Scale: Poor Standing balance comment: Pt requires UE support for standing balance.                              Pertinent Vitals/Pain Pain Assessment: No/denies pain    Home Living Family/patient expects to be discharged to:: Private residence Living Arrangements: Spouse/significant other Available Help at Discharge: Family;Available 24 hours/day Type of Home: House Home Access: Stairs to enter Entrance Stairs-Rails: Right Entrance Stairs-Number of Steps: 4 Home Layout: One level Home Equipment: Walker - 2 wheels;Cane - single point;Shower seat      Prior Function Level of Independence: Independent;Needs assistance         Comments: Occasionally required assist for LB dressing from wife     Hand Dominance   Dominant Hand: Right    Extremity/Trunk Assessment   Upper Extremity  Assessment: Defer to OT evaluation           Lower Extremity Assessment: Generalized weakness      Cervical / Trunk Assessment: Normal  Communication   Communication: No difficulties  Cognition Arousal/Alertness: Awake/alert Behavior During Therapy: WFL for tasks assessed/performed Overall Cognitive Status: Within  Functional Limits for tasks assessed                      General Comments      Exercises        Assessment/Plan    PT Assessment Patient needs continued PT services  PT Diagnosis Difficulty walking;Generalized weakness   PT Problem List Decreased strength;Decreased range of motion;Decreased activity tolerance;Decreased balance;Decreased mobility;Decreased knowledge of use of DME;Decreased safety awareness;Decreased knowledge of precautions;Cardiopulmonary status limiting activity  PT Treatment Interventions DME instruction;Gait training;Stair training;Functional mobility training;Therapeutic activities;Therapeutic exercise;Neuromuscular re-education;Patient/family education   PT Goals (Current goals can be found in the Care Plan section) Acute Rehab PT Goals Patient Stated Goal: To return home with wife PT Goal Formulation: With patient Time For Goal Achievement: 10/05/14 Potential to Achieve Goals: Good    Frequency Min 3X/week   Barriers to discharge        Co-evaluation               End of Session Equipment Utilized During Treatment: Gait belt;Oxygen Activity Tolerance: Patient tolerated treatment well Patient left: in chair;with call bell/phone within reach;with chair alarm set Nurse Communication: Mobility status         Time: 5701-7793 PT Time Calculation (min): 27 min   Charges:   PT Evaluation $Initial PT Evaluation Tier I: 1 Procedure PT Treatments $Gait Training: 8-22 mins $Therapeutic Activity: 8-22 mins   PT G Codes:          Rolinda Roan 09/28/2014, 1:26 PM  Rolinda Roan, PT, DPT Acute Rehabilitation Services Pager: [phone]

## 2014-09-28 NOTE — Progress Notes (Signed)
Report given to receiving RN. Patient in bed resting. No verbal complaints and no signs or symptoms of distress or discomfort.  

## 2014-09-28 NOTE — Progress Notes (Signed)
[facility] for Coumadin Indication: h/o atrial fibrillation  Allergies  Allergen Reactions  . Cephalexin Other (See Comments)    "too strong for my system" - per office note 05/05/2012  . Ciprofloxacin Other (See Comments)    Reaction unknown  . Hctz [Hydrochlorothiazide]     DIZZINESS   . Levofloxacin Other (See Comments)    "too strong for my system" - per office note 05/05/2012  . Lipitor [Atorvastatin Calcium]     MUSCLE PAIN  . Niaspan [Niacin Er]     FLUSHING   . Oxycodone-Acetaminophen Other (See Comments)    Reaction unknown  . Sulfonamide Derivatives Other (See Comments)    Reaction unkonwn    Labs:  Recent Labs  09/27/14 1155 09/28/14 0345  HGB 11.9* 10.7*  HCT 36.0* 32.7*  PLT 102* 86*  LABPROT 35.9* 39.0*  INR 3.60* 4.00*  CREATININE 2.63* 2.65*  TROPONINI <0.30  --     Estimated Creatinine Clearance: 23 ml/min (by C-G formula based on Cr of 2.65).   Assessment: 80 y.o male on chronic coumadin PTA for atrial fibrillation.  Rate is controlled with Toprol-XL. PTA dose of coumadin:   3 mg daily except 4.5 mg qWed.- last dose taken on 09/26/14.  Today on admission his INR is 3.6, SUPRAtherapeutic.  No bleeding reported. Last anticoag office visit note on 9/17 indicates INR was 3.2. At that time the patient was instructed to skip that day's dose then resume usual regimen of 3mg  daily except 4.5 mg qWed.  On 08/15/14 anticoag visit, the INR was 2.9 and on 07/25/14 the INR was 2.8 on this same dosage regimen.     INR today still supra-therapeutic  The patient has a PMH of CK stage III, chronic CHF and history of colon cancer status post colectomy with colostomy and other PMH as noted above. He presented to ED today with shortness of breath about 5 days ago, worsening about every day, he also reported generalized weakness to the point he couldn't change his colostomy bag. Patient reported significant weight loss since last year. He  was admitted for acute on chronic diastolic HF  CKD stage IV , has only one kidney working per pt's report, secondary to kidney stone and infection.  SCr = 2.63, baseline improved, he was 3.6 in March of 2015.   Goal of Therapy:  INR 2-3 Monitor platelets by anticoagulation protocol: Yes   Plan:  Hold coumadin today PT/INR daily.   Thank you Anette Guarneri, PharmD [phone]  09/28/2014,11:36 AM

## 2014-09-28 NOTE — Progress Notes (Signed)
PA notified and made aware of HR decreasing more frequently. New orders given. Cardura has been D/C'd and was told to give the patient a cup of coffee. Patient continues to be asymptomatic. Will continue to monitor patient for further changes in condition.

## 2014-09-28 NOTE — Progress Notes (Signed)
Notified cardiologist about HR decreasing as low as 33. Parameters placed over at central telemetry. Will continue to monitor patient if patient becomes symptomatic and if HR is less than 35 sustaining for 10 seconds or longer.

## 2014-09-28 NOTE — Progress Notes (Signed)
  Echocardiogram 2D Echocardiogram has been performed.  Diamond Nickel 09/28/2014, 3:28 PM

## 2014-09-28 NOTE — Progress Notes (Signed)
The patient was bladder scanned at 2100 after he was only urinating 25 mL at a time and felt uncomfortable.  The bladder scan revealed 250 mL of urine in his bladder.  Jonette Eva was notified.  New orders were given to in-and-out cath the patient one hour after giving the patient his evening dose of Lasix.  The RN carried out the orders.  The patient's output was 600 mL from the in-and-out cath.

## 2014-09-28 NOTE — Progress Notes (Signed)
The patient was given a heart failure packet.

## 2014-09-28 NOTE — Progress Notes (Signed)
Both the day shift RN and the night shift RN discussed positioning with the patient in order to take the pressure off of his sacral area and buttocks.  He currently has a Stage II pressure ulcer on his right buttock.  The patient was noncompliant with the education and wanted to remain in an upright position, sitting position.

## 2014-09-28 NOTE — Progress Notes (Signed)
INITIAL NUTRITION ASSESSMENT  DOCUMENTATION CODES Per approved criteria  -Severe malnutrition in the context of chronic illness   INTERVENTION: Add Ensure Complete po daily, each supplement provides 350 kcal and 13 grams of protein. Add Ensure Pudding po daily, each supplement provides 170 kcal and 4 grams of protein. RD to continue to follow nutrition care plan.  NUTRITION DIAGNOSIS: Inadequate oral intake related to poor appetite as evidenced by patient report and ongoing significant weight loss.   Goal: Intake to meet >90% of estimated nutrition needs.  Monitor:  weight trends, lab trends, I/O's, PO intake, supplement tolerance  Reason for Assessment: Malnutrition Screening Tool  80 y.o. male  Admitting Dx: Acute on chronic diastolic HF (heart failure)  ASSESSMENT: PMHx significant for CKD stage III, CHF, colon CA s/p colectomy and colostomy. Admitted with SOB x 5 days, weakness and ongoing weight loss. Work-up reveals CHF exacerbation.  Pt weighed approximately 230 lb one year ago. Currently down to 170 lb, however continues to have significant fluid, question actual dry weight.This is a total weight loss of 60 lb, or 26% x 1 year and is significant for this time frame. Pt confirms ongoing weight loss. He reports that his appetite is not what it used to be. Eats a bowl of cereal in the morning, has a pack of peanut butter crackers for lunch, and then eats a portion of whatever his wife has made for dinner. He states that his appetite is presently fair, and meal intake records report he is eating 25% of his meals. He states that he follows a 32-oz fluid restriction at home. Has not tried Ensure or Boost before, and is willing to try it now. Pt states that he feels so weak these days.  Nutrition Focused Physical Exam:  Subcutaneous Fat:  Orbital Region: moderate depletion Upper Arm Region: moderate depletion Thoracic and Lumbar Region: severe depletion  Muscle:  Temple  Region: moderate depletion Clavicle Bone Region: moderate depletion Clavicle and Acromion Bone Region: n/a Scapular Bone Region: n/a Dorsal Hand: n/a Patellar Region: n/a Anterior Thigh Region: +4 edema Posterior Calf Region: +4 edema  Edema: significant edema to lower extremities  Pt meets criteria for severe MALNUTRITION in the context of chronic illness as evidenced by 26% wt loss x 1 year, severe fat mass loss, and severe fluid accumulation.  Potassium low at 3.2 Magnesium WNL  Height: Ht Readings from Last 1 Encounters:  09/27/14 5\' 9"  (1.753 m)    Weight: Wt Readings from Last 1 Encounters:  09/28/14 170 lb 12.8 oz (77.474 kg)    Ideal Body Weight: 160 lb  % Ideal Body Weight: 106%  Wt Readings from Last 25 Encounters:  09/28/14 170 lb 12.8 oz (77.474 kg)  09/12/14 180 lb (81.647 kg)  06/19/14 186 lb (84.369 kg)  06/17/14 186 lb 1.6 oz (84.414 kg)  05/21/14 188 lb 12.8 oz (85.639 kg)  03/18/14 184 lb (83.462 kg)  02/25/14 193 lb 8 oz (87.771 kg)  01/21/14 204 lb (92.534 kg)  01/14/14 206 lb 11.2 oz (93.759 kg)  12/17/13 208 lb 8 oz (94.575 kg)  11/26/13 213 lb 13.5 oz (97 kg)  10/19/13 199 lb 14.4 oz (90.674 kg)  10/17/13 201 lb (91.173 kg)  10/16/13 202 lb 12.8 oz (91.989 kg)  08/09/13 229 lb (103.874 kg)  08/06/13 229 lb 6.4 oz (104.055 kg)  07/16/13 235 lb 1.9 oz (106.65 kg)  07/10/13 230 lb 12.8 oz (104.69 kg)  06/11/13 222 lb 12.8 oz (101.061 kg)  04/08/13  224 lb 8 oz (101.833 kg)  01/29/13 224 lb (101.606 kg)  10/19/12 222 lb 6.4 oz (100.88 kg)  09/01/12 220 lb (99.791 kg)  07/04/12 220 lb 6 oz (99.961 kg)  05/05/12 207 lb (93.895 kg)     Usual Body Weight: 230 lb  % Usual Body Weight: 74%  BMI:  Body mass index is 25.21 kg/(m^2). Overweight  Estimated Nutritional Needs: Kcal: 1700 - 1900 Protein: 75 - 85 g Fluid: per MD  Skin: stage II to R buttocks  Diet Order: Cardiac  EDUCATION NEEDS: -Education not appropriate at this  time   Intake/Output Summary (Last 24 hours) at 09/28/14 1143 Last data filed at 09/28/14 1039  Gross per 24 hour  Intake    363 ml  Output   1325 ml  Net   -962 ml    Last BM: 10/2 via ostomy  Labs:   Recent Labs Lab 09/27/14 1155 09/27/14 1814 09/28/14 0345  NA 143  --  142  K 3.2*  --  3.2*  CL 95*  --  96  CO2 35*  --  37*  BUN 22  --  23  CREATININE 2.63*  --  2.65*  CALCIUM 10.3  --  9.7  MG  --  2.1  --   GLUCOSE 104*  --  80    CBG (last 3)  No results found for this basename: GLUCAP,  in the last 72 hours  Scheduled Meds: . amoxicillin-clavulanate  500 mg Oral Q12H  . doxazosin  8 mg Oral QHS  . finasteride  5 mg Oral Daily  . furosemide  40 mg Intravenous BID  . pantoprazole  40 mg Oral Daily  . potassium chloride  40 mEq Oral BID  . sodium chloride  3 mL Intravenous Q12H  . Warfarin - Pharmacist Dosing Inpatient   Does not apply q1800    Continuous Infusions:   Past Medical History  Diagnosis Date  . 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 Pure hypercholesterolemia     takes Vytorin daily  . Personal history of colonic polyps     adenomatous  . Diverticulosis of colon (without mention of hemorrhage)   . Cardiovascular disease   . IHD (ischemic heart disease)   . History of colon cancer   . Colon cancer   . Hypertension     takes Amlodipine,Metoprolol,and Cardura daily  . Coronary artery disease   . Acute myocardial infarction, unspecified site, episode of care unspecified   . MI (myocardial infarction) 1993  . CHF (congestive heart failure)     takes Lasix daily  . H/O blood clots 1993  . Dysrhythmia   . Shortness of breath     with exertion  . Arthritis   . Joint pain   . Joint swelling   . Edema     to right leg-below knee;not any different than when he saw brackbill in jan 2013  . Bruises easily     takes ASA daily  . Skin cancer     nose  . GERD (gastroesophageal reflux disease)     takes Protonix daily  . Diarrhea   .  Constipation   . Enlarged prostate     takes Finasteride daily  . Blood transfusion 2008  . Insomnia   . Hx of CABG     1993  . Ejection fraction     EF 51%, nuclear, 2010  . Pyelonephritis 11/20/2013  . CKD (chronic kidney disease) stage  4, GFR 15-29 ml/min     Past Surgical History  Procedure Laterality Date  . Shoulder surgery  2007    left  . Cataract extraction      right  . Abdominoperineal proctocolectomy  06/01/2007  . Coronary angioplasty  05/22/92  . Colostomy  2008  . Colonoscopy    . Coronary artery bypass graft  1993  . Colon surgery  2008    colectomy  . Knee arthroscopy  2010    right   . Total knee arthroplasty  02/28/2012    Procedure: TOTAL KNEE ARTHROPLASTY;  Surgeon: Rudean Haskell, MD;  Location: [facility];  Service: Orthopedics;  Laterality: Right;    Inda Coke MS, RD, LDN Inpatient Registered Dietitian After-hours pager: [phone]

## 2014-09-28 NOTE — Progress Notes (Addendum)
DAILY PROGRESS NOTE  Subjective:  No change in breathing. Net negative 1L overnight. BNP was elevated at 7800, however, creatinine is stable around 2.65. Down another kg overnight.  Noted periods of bradycardia overnight with a-fib, some down into the 30's.  Objective:  Temp:  [97.3 F (36.3 C)-97.6 F (36.4 C)] 97.4 F (36.3 C) (10/03 0457) Pulse Rate:  [31-113] 90 (10/03 0457) Resp:  [8-23] 18 (10/03 0457) BP: (128-184)/(43-101) 147/85 mmHg (10/03 0507) SpO2:  [94 %-99 %] 99 % (10/03 0457) Weight:  [170 lb 12.8 oz (77.474 kg)-172 lb 6.4 oz (78.2 kg)] 170 lb 12.8 oz (77.474 kg) (10/03 0457) Weight change:   Intake/Output from previous day: 10/02 0701 - 10/03 0700 In: 240 [P.O.:240] Out: 1325 [Urine:1325]  Intake/Output from this shift:    Medications: Current Facility-Administered Medications  Medication Dose Route Frequency Provider Last Rate Last Dose  . acetaminophen (TYLENOL) tablet 650 mg  650 mg Oral Q6H PRN Verlee Monte, MD   650 mg at 09/28/14 0145   Or  . acetaminophen (TYLENOL) suppository 650 mg  650 mg Rectal Q6H PRN Verlee Monte, MD      . alum & mag hydroxide-simeth (MAALOX/MYLANTA) 200-200-20 MG/5ML suspension 30 mL  30 mL Oral Q6H PRN Verlee Monte, MD      . amoxicillin-clavulanate (AUGMENTIN) 875-125 MG per tablet 1 tablet  1 tablet Oral Q12H Verlee Monte, MD   1 tablet at 09/27/14 2155  . doxazosin (CARDURA) tablet 8 mg  8 mg Oral QHS Verlee Monte, MD   8 mg at 09/27/14 2155  . finasteride (PROSCAR) tablet 5 mg  5 mg Oral Daily Verlee Monte, MD      . furosemide (LASIX) injection 40 mg  40 mg Intravenous BID Verlee Monte, MD   40 mg at 09/27/14 2156  . metoprolol succinate (TOPROL-XL) 24 hr tablet 12.5 mg  12.5 mg Oral Daily Verlee Monte, MD      . morphine 2 MG/ML injection 1 mg  1 mg Intravenous Q4H PRN Verlee Monte, MD      . ondansetron (ZOFRAN) tablet 4 mg  4 mg Oral Q6H PRN Verlee Monte, MD       Or  . ondansetron (ZOFRAN) injection 4 mg  4 mg  Intravenous Q6H PRN Verlee Monte, MD      . pantoprazole (PROTONIX) EC tablet 40 mg  40 mg Oral Daily Mutaz Elmahi, MD      . sodium chloride 0.9 % injection 3 mL  3 mL Intravenous Q12H Verlee Monte, MD   3 mL at 09/27/14 2156  . Warfarin - Pharmacist Dosing Inpatient   Does not apply q1800 Arman Bogus, [facility]        Physical Exam: General appearance: alert, appears older than stated age, cachectic and no distress Neck: no carotid bruit and no JVD Lungs: diminished breath sounds RLL Heart: irregularly irregular rhythm Abdomen: soft, colostomy bag in place, no masses Extremities: edema 2+ bilateral pitting edema Pulses: 2+ and symmetric Skin: pale, warm, dry Neurologic: Mental status: Alert, oriented, thought content appropriate Psych: Flat affect  Lab Results: Results for orders placed during the hospital encounter of 09/27/14 (from the past 48 hour(s))  CBC     Status: Abnormal   Collection Time    09/27/14 11:55 AM      Result Value Ref Range   WBC 6.4  4.0 - 10.5 K/uL   RBC 3.69 (*) 4.22 - 5.81 MIL/uL   Hemoglobin 11.9 (*) 13.0 - 17.0  g/dL   HCT 36.0 (*) 39.0 - 52.0 %   MCV 97.6  78.0 - 100.0 fL   MCH 32.2  26.0 - 34.0 pg   MCHC 33.1  30.0 - 36.0 g/dL   RDW 13.9  11.5 - 15.5 %   Platelets 102 (*) 150 - 400 K/uL   Comment: PLATELET COUNT CONFIRMED BY SMEAR  BASIC METABOLIC PANEL     Status: Abnormal   Collection Time    09/27/14 11:55 AM      Result Value Ref Range   Sodium 143  137 - 147 mEq/L   Potassium 3.2 (*) 3.7 - 5.3 mEq/L   Chloride 95 (*) 96 - 112 mEq/L   CO2 35 (*) 19 - 32 mEq/L   Glucose, Bld 104 (*) 70 - 99 mg/dL   BUN 22  6 - 23 mg/dL   Creatinine, Ser 2.63 (*) 0.50 - 1.35 mg/dL   Calcium 10.3  8.4 - 10.5 mg/dL   GFR calc non Af Amer 22 (*) >90 mL/min   GFR calc Af Amer 25 (*) >90 mL/min   Comment: (NOTE)     The eGFR has been calculated using the CKD EPI equation.     This calculation has not been validated in all clinical situations.     eGFR's  persistently <90 mL/min signify possible Chronic Kidney     Disease.   Anion gap 13  5 - 15  TROPONIN I     Status: None   Collection Time    09/27/14 11:55 AM      Result Value Ref Range   Troponin I <0.30  <0.30 ng/mL   Comment:            Due to the release kinetics of cTnI,     a negative result within the first hours     of the onset of symptoms does not rule out     myocardial infarction with certainty.     If myocardial infarction is still suspected,     repeat the test at appropriate intervals.  PRO B NATRIURETIC PEPTIDE     Status: Abnormal   Collection Time    09/27/14 11:55 AM      Result Value Ref Range   Pro B Natriuretic peptide (BNP) 7803.0 (*) 0 - 450 pg/mL  PROTIME-INR     Status: Abnormal   Collection Time    09/27/14 11:55 AM      Result Value Ref Range   Prothrombin Time 35.9 (*) 11.6 - 15.2 seconds   INR 3.60 (*) 0.00 - 1.49  URINALYSIS, ROUTINE W REFLEX MICROSCOPIC     Status: Abnormal   Collection Time    09/27/14 12:17 PM      Result Value Ref Range   Color, Urine YELLOW  YELLOW   APPearance CLEAR  CLEAR   Specific Gravity, Urine 1.010  1.005 - 1.030   pH 5.5  5.0 - 8.0   Glucose, UA NEGATIVE  NEGATIVE mg/dL   Hgb urine dipstick SMALL (*) NEGATIVE   Bilirubin Urine NEGATIVE  NEGATIVE   Ketones, ur NEGATIVE  NEGATIVE mg/dL   Protein, ur NEGATIVE  NEGATIVE mg/dL   Urobilinogen, UA 0.2  0.0 - 1.0 mg/dL   Nitrite NEGATIVE  NEGATIVE   Leukocytes, UA NEGATIVE  NEGATIVE  URINE MICROSCOPIC-ADD ON     Status: None   Collection Time    09/27/14 12:17 PM      Result Value Ref Range   Squamous Epithelial /  LPF RARE  RARE   WBC, UA 0-2  <3 WBC/hpf   RBC / HPF 3-6  <3 RBC/hpf   Bacteria, UA RARE  RARE  MAGNESIUM     Status: None   Collection Time    09/27/14  6:14 PM      Result Value Ref Range   Magnesium 2.1  1.5 - 2.5 mg/dL  TSH     Status: None   Collection Time    09/27/14  8:06 PM      Result Value Ref Range   TSH 1.540  0.350 - 4.500 uIU/mL    SEDIMENTATION RATE     Status: None   Collection Time    09/27/14  8:06 PM      Result Value Ref Range   Sed Rate 10  0 - 16 mm/hr  CEA     Status: None   Collection Time    09/27/14  8:06 PM      Result Value Ref Range   CEA 0.6  0.0 - 5.0 ng/mL   Comment: Performed at [facility]  PSA     Status: None   Collection Time    09/27/14  8:06 PM      Result Value Ref Range   PSA 1.29  <=4.00 ng/mL   Comment: (NOTE)     Test Methodology: ECLIA PSA (Electrochemiluminescence Immunoassay)     For PSA values from 2.5-4.0, particularly in younger men <60 years     old, the AUA and NCCN suggest testing for % Free PSA (3515) and     evaluation of the rate of increase in PSA (PSA velocity).     Performed at [facility]     Status: Abnormal   Collection Time    09/28/14  3:45 AM      Result Value Ref Range   Sodium 142  137 - 147 mEq/L   Potassium 3.2 (*) 3.7 - 5.3 mEq/L   Chloride 96  96 - 112 mEq/L   CO2 37 (*) 19 - 32 mEq/L   Glucose, Bld 80  70 - 99 mg/dL   BUN 23  6 - 23 mg/dL   Creatinine, Ser 2.65 (*) 0.50 - 1.35 mg/dL   Calcium 9.7  8.4 - 10.5 mg/dL   Total Protein 6.1  6.0 - 8.3 g/dL   Albumin 3.5  3.5 - 5.2 g/dL   AST 20  0 - 37 U/L   ALT 12  0 - 53 U/L   Alkaline Phosphatase 65  39 - 117 U/L   Total Bilirubin 1.1  0.3 - 1.2 mg/dL   GFR calc non Af Amer 22 (*) >90 mL/min   GFR calc Af Amer 25 (*) >90 mL/min   Comment: (NOTE)     The eGFR has been calculated using the CKD EPI equation.     This calculation has not been validated in all clinical situations.     eGFR's persistently <90 mL/min signify possible Chronic Kidney     Disease.   Anion gap 9  5 - 15  CBC     Status: Abnormal   Collection Time    09/28/14  3:45 AM      Result Value Ref Range   WBC 6.6  4.0 - 10.5 K/uL   RBC 3.35 (*) 4.22 - 5.81 MIL/uL   Hemoglobin 10.7 (*) 13.0 - 17.0 g/dL   HCT 32.7 (*) 39.0 - 52.0 %   MCV 97.6  78.0 - 100.0 fL   MCH 31.9   26.0 - 34.0 pg   MCHC 32.7  30.0 - 36.0 g/dL   RDW 14.0  11.5 - 15.5 %   Platelets 86 (*) 150 - 400 K/uL   Comment: CONSISTENT WITH PREVIOUS RESULT  PROTIME-INR     Status: Abnormal   Collection Time    09/28/14  3:45 AM      Result Value Ref Range   Prothrombin Time 39.0 (*) 11.6 - 15.2 seconds   INR 4.00 (*) 0.00 - 1.49    Imaging: Ct Abdomen Pelvis Wo Contrast  09/27/2014   CLINICAL DATA:  Weight loss. History of rectal and colon cancer post colostomy.  EXAM: CT ABDOMEN AND PELVIS WITHOUT CONTRAST  TECHNIQUE: Multidetector CT imaging of the abdomen and pelvis was performed following the standard protocol without IV contrast.  COMPARISON:  11/20/2013  FINDINGS: Technically limited study due to motion artifact and streak artifact.  Bilateral pleural effusions, greater on the right. Atelectasis in the lung bases. Postoperative changes in the mediastinum. Calcification of the native Coronary arteries.  The unenhanced appearance of the liver, spleen, gallbladder, pancreas, adrenal glands, inferior vena cava, and retroperitoneal lymph nodes is unremarkable. Extensive calcification throughout the abdominal aorta without aneurysm. Bilateral renal atrophy, greater on the right. Low-attenuation mass lesions are arising from the upper pole of the left kidney likely representing cysts. No significant change in size since previous study. No hydronephrosis or hydroureter. Stomach, small bowel, and colon are not abnormally distended. Sigmoid colonic resection with left lower quadrant colostomy. There is of peristomal hernia containing small bowel and colon. There is an additional ventral anterior abdominal wall hernia containing colon. Contrast material passes through this area suggesting no evidence of obstruction. No free air or free fluid in the abdomen.  Pelvis: Postoperative changes in the pelvis with rectal resection. Soft tissue thickening and infiltration in the presacral space appears unchanged since  previous study, likely postoperative. Small amount of free fluid in the pelvis is nonspecific. Prostate gland is enlarged. Bladder wall is not thickened. Spondylolysis with mild spondylolisthesis at L5-S1. Diffuse degenerative changes throughout the cervical spine. No destructive bone lesions appreciated.  IMPRESSION: Surgical resection of the rectosigmoid colon with left lower quadrant colostomy. Peristomal and anterior abdominal wall hernias containing colon and small bowel without evidence of obstruction. Small amount of free fluid in the pelvis is nonspecific. Bilateral renal atrophy with probable left renal cysts. Bilateral pleural effusions with basilar atelectasis.   Electronically Signed   By: Lucienne Capers M.D.   On: 09/27/2014 23:36   Ct Head Wo Contrast  09/27/2014   CLINICAL DATA:  Increasing weakness and fatigue over the past several weeks ; right arm has a heavy feeling; history of CHF and MI and coronary artery bypass grafting  EXAM: CT HEAD WITHOUT CONTRAST  TECHNIQUE: Contiguous axial images were obtained from the base of the skull through the vertex without intravenous contrast.  COMPARISON:  None.  FINDINGS: There is mild age appropriate diffuse cerebral and cerebellar atrophy. The ventricles are normal in size and position. There is no intracranial hemorrhage nor intracranial mass effect. There is decreased density in the deep white matter of both cerebral hemispheres consistent with chronic small vessel ischemia.  There is postsurgical change associated with the right maxillary sinus with thickening of the lateral sinus wall. No air-fluid levels are demonstrated. The calvarium is intact.  IMPRESSION: 1. There is no acute intracranial hemorrhage nor evidence of acute ischemic change. 2.  There are changes of chronic small vessel ischemia in the deep white matter of both cerebral hemispheres. There is also mild age appropriate diffuse atrophy. 3. There is no acute abnormality of the  calvarium.   Electronically Signed   By: David  Martinique   On: 09/27/2014 13:37   Dg Chest Port 1 View  09/27/2014   CLINICAL DATA:  Weakness for several weeks. Progressive dyspnea. Progressive shortness of breath.  EXAM: PORTABLE CHEST - 1 VIEW  COMPARISON:  09/25/2014.  FINDINGS: Lower lung volumes with cardiomegaly. CABG. Monitoring leads project over the chest. Small RIGHT pleural effusion. Pulmonary vascular congestion. LEFT shoulder hemiarthroplasty it RIGHT shoulder osteoarthritis.  IMPRESSION: Mild CHF with small RIGHT pleural effusion.   Electronically Signed   By: Dereck Ligas M.D.   On: 09/27/2014 12:15    Assessment:  1. Principal Problem: 2.   Acute on chronic diastolic HF (heart failure) 3. Active Problems: 4.   Essential hypertension 5.   Atrial fibrillation 6.   Weakness generalized 7.   CKD (chronic kidney disease) stage 4, GFR 15-29 ml/min 8.   Plan:  1. Continue IV diuresis today. Will evaluate echocardiogram, but suspect weakness and decline due to metabolic/nutritional reasons (however, albumin is 3.5), possibly malabsorption (vitamin deficiency) or even recurrent malignancy (however CEA is low), not as likely to be significant systolic heart failure. Transient bradycardia - hard to know if this is causing weakness, will need to hold b-blocker. Continue telemetry.  Time Spent Directly with Patient:  15 minutes  Length of Stay:  LOS: 1 day   Pixie Casino, MD, [facility] Attending Cardiologist [facility]  HILTY,Kenneth C 09/28/2014, 8:16 AM

## 2014-09-28 NOTE — Progress Notes (Signed)
The patient felt uncomfortable and could not void.  A bladder scan revealed 200 mL of urine in his bladder.  New orders were given to insert a Foley catheter.  The RN carried out the orders.

## 2014-09-28 NOTE — Progress Notes (Signed)
Called about periods of a-fib with slow ventricular response. Will need to notify MD for sustained bradycardia more than 10 seconds at HR <35, with new or worsening symptoms. AVN blocking agents have been discontinued. May need EP evaluation for pacemaker if it does not improve by tomorrow.  Pixie Casino, MD, [facility] Attending Cardiologist [place]

## 2014-09-29 DIAGNOSIS — I4891 Unspecified atrial fibrillation: Secondary | ICD-10-CM

## 2014-09-29 DIAGNOSIS — E8779 Other fluid overload: Secondary | ICD-10-CM

## 2014-09-29 DIAGNOSIS — R001 Bradycardia, unspecified: Secondary | ICD-10-CM

## 2014-09-29 LAB — PROTIME-INR
INR: 3.81 — ABNORMAL HIGH (ref 0.00–1.49)
Prothrombin Time: 37.5 seconds — ABNORMAL HIGH (ref 11.6–15.2)

## 2014-09-29 LAB — MRSA PCR SCREENING: MRSA by PCR: NEGATIVE

## 2014-09-29 MED ORDER — ATROPINE SULFATE 0.4 MG/ML IJ SOLN
0.4000 mg | Freq: Once | INTRAMUSCULAR | Status: DC
  Filled 2014-09-29: qty 1

## 2014-09-29 MED ORDER — ATROPINE SULFATE 0.1 MG/ML IJ SOLN
INTRAMUSCULAR | Status: AC
  Filled 2014-09-29: qty 10

## 2014-09-29 MED ORDER — FUROSEMIDE 40 MG PO TABS
40.0000 mg | ORAL_TABLET | Freq: Every day | ORAL | Status: DC
  Administered 2014-09-30 – 2014-10-01 (×2): 40 mg via ORAL
  Filled 2014-09-29 (×2): qty 1

## 2014-09-29 MED ORDER — FUROSEMIDE 40 MG PO TABS
40.0000 mg | ORAL_TABLET | Freq: Every day | ORAL | Status: DC
  Filled 2014-09-29: qty 2

## 2014-09-29 NOTE — Progress Notes (Signed)
DAILY PROGRESS NOTE  Subjective:  No changes overnight. Net positive recorded. Holding diuretics. B-blocker held. Still having a-fib with bradycardia and pauses close to 5 seconds. Suspect he will need a pacemaker. 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  Objective:  Temp:  [97.5 F (36.4 C)-98 F (36.7 C)] 98 F (36.7 C) (10/04 0806) Pulse Rate:  [33-115] 50 (10/04 0806) Resp:  [18] 18 (10/04 0505) BP: (111-151)/(37-68) 142/68 mmHg (10/04 0806) SpO2:  [99 %-100 %] 99 % (10/04 0806) Weight:  [167 lb 3.2 oz (75.841 kg)] 167 lb 3.2 oz (75.841 kg) (10/04 0505) Weight change: -5 lb 3.2 oz (-2.359 kg)  Intake/Output from previous day: 10/03 0701 - 10/04 0700 In: 2103 [P.O.:1200; I.V.:3] Out: 450 [Urine:450]  Intake/Output from this shift:    Medications: Current Facility-Administered Medications  Medication Dose Route Frequency Provider Last Rate Last Dose  . acetaminophen (TYLENOL) tablet 650 mg  650 mg Oral Q6H PRN Verlee Monte, MD   650 mg at 09/29/14 0545   Or  . acetaminophen (TYLENOL) suppository 650 mg  650 mg Rectal Q6H PRN Verlee Monte, MD      . alum & mag hydroxide-simeth (MAALOX/MYLANTA) 200-200-20 MG/5ML suspension 30 mL  30 mL Oral Q6H PRN Verlee Monte, MD      . amoxicillin-clavulanate (AUGMENTIN) 500-125 MG per tablet 500 mg  500 mg Oral Q12H Charlynne Cousins, MD   500 mg at 09/28/14 2251  . atropine 0.1 MG/ML injection           . atropine injection 0.4 mg  0.4 mg Intravenous Once Charlynne Cousins, MD      . feeding supplement (ENSURE COMPLETE) (ENSURE COMPLETE) liquid 237 mL  237 mL Oral Q24H Erlene Quan, RD      . feeding supplement (ENSURE) (ENSURE) pudding 1 Container  1 Container Oral Q24H Erlene Quan, RD      . finasteride (PROSCAR) tablet 5 mg  5 mg Oral Daily Verlee Monte, MD   5 mg at 09/28/14 1020  . furosemide  (LASIX) tablet 40-80 mg  40-80 mg Oral Daily Charlynne Cousins, MD      . morphine 2 MG/ML injection 1 mg  1 mg Intravenous Q4H PRN Verlee Monte, MD      . ondansetron (ZOFRAN) tablet 4 mg  4 mg Oral Q6H PRN Verlee Monte, MD       Or  . ondansetron (ZOFRAN) injection 4 mg  4 mg Intravenous Q6H PRN Verlee Monte, MD      . pantoprazole (PROTONIX) EC tablet 40 mg  40 mg Oral Daily Verlee Monte, MD   40 mg at 09/28/14 1020  . sodium chloride 0.9 % injection 3 mL  3 mL Intravenous Q12H Verlee Monte, MD   3 mL at 09/28/14 2251  . Warfarin - Pharmacist Dosing Inpatient   Does not apply q1800 Arman Bogus, [facility]        Physical Exam: General appearance: alert, appears older than stated age, cachectic and no distress Neck: no carotid bruit and no JVD Lungs: diminished breath sounds RLL Heart: irregular bradycardia Abdomen: soft, colostomy bag in place, no masses Extremities: edema 2+ bilateral pitting edema Pulses: 2+ and symmetric Skin: pale, warm, dry Neurologic: Mental status: Alert, oriented, thought content appropriate Psych: Flat affect  Lab Results: Results for orders placed during the hospital  encounter of 09/27/14 (from the past 48 hour(s))  CBC     Status: Abnormal   Collection Time    09/27/14 11:55 AM      Result Value Ref Range   WBC 6.4  4.0 - 10.5 K/uL   RBC 3.69 (*) 4.22 - 5.81 MIL/uL   Hemoglobin 11.9 (*) 13.0 - 17.0 g/dL   HCT 36.0 (*) 39.0 - 52.0 %   MCV 97.6  78.0 - 100.0 fL   MCH 32.2  26.0 - 34.0 pg   MCHC 33.1  30.0 - 36.0 g/dL   RDW 13.9  11.5 - 15.5 %   Platelets 102 (*) 150 - 400 K/uL   Comment: PLATELET COUNT CONFIRMED BY SMEAR  BASIC METABOLIC PANEL     Status: Abnormal   Collection Time    09/27/14 11:55 AM      Result Value Ref Range   Sodium 143  137 - 147 mEq/L   Potassium 3.2 (*) 3.7 - 5.3 mEq/L   Chloride 95 (*) 96 - 112 mEq/L   CO2 35 (*) 19 - 32 mEq/L   Glucose, Bld 104 (*) 70 - 99 mg/dL   BUN 22  6 - 23 mg/dL   Creatinine, Ser 2.63  (*) 0.50 - 1.35 mg/dL   Calcium 10.3  8.4 - 10.5 mg/dL   GFR calc non Af Amer 22 (*) >90 mL/min   GFR calc Af Amer 25 (*) >90 mL/min   Comment: (NOTE)     The eGFR has been calculated using the CKD EPI equation.     This calculation has not been validated in all clinical situations.     eGFR's persistently <90 mL/min signify possible Chronic Kidney     Disease.   Anion gap 13  5 - 15  TROPONIN I     Status: None   Collection Time    09/27/14 11:55 AM      Result Value Ref Range   Troponin I <0.30  <0.30 ng/mL   Comment:            Due to the release kinetics of cTnI,     a negative result within the first hours     of the onset of symptoms does not rule out     myocardial infarction with certainty.     If myocardial infarction is still suspected,     repeat the test at appropriate intervals.  PRO B NATRIURETIC PEPTIDE     Status: Abnormal   Collection Time    09/27/14 11:55 AM      Result Value Ref Range   Pro B Natriuretic peptide (BNP) 7803.0 (*) 0 - 450 pg/mL  PROTIME-INR     Status: Abnormal   Collection Time    09/27/14 11:55 AM      Result Value Ref Range   Prothrombin Time 35.9 (*) 11.6 - 15.2 seconds   INR 3.60 (*) 0.00 - 1.49  URINALYSIS, ROUTINE W REFLEX MICROSCOPIC     Status: Abnormal   Collection Time    09/27/14 12:17 PM      Result Value Ref Range   Color, Urine YELLOW  YELLOW   APPearance CLEAR  CLEAR   Specific Gravity, Urine 1.010  1.005 - 1.030   pH 5.5  5.0 - 8.0   Glucose, UA NEGATIVE  NEGATIVE mg/dL   Hgb urine dipstick SMALL (*) NEGATIVE   Bilirubin Urine NEGATIVE  NEGATIVE   Ketones, ur NEGATIVE  NEGATIVE mg/dL   Protein,  ur NEGATIVE  NEGATIVE mg/dL   Urobilinogen, UA 0.2  0.0 - 1.0 mg/dL   Nitrite NEGATIVE  NEGATIVE   Leukocytes, UA NEGATIVE  NEGATIVE  URINE MICROSCOPIC-ADD ON     Status: None   Collection Time    09/27/14 12:17 PM      Result Value Ref Range   Squamous Epithelial / LPF RARE  RARE   WBC, UA 0-2  <3 WBC/hpf   RBC / HPF  3-6  <3 RBC/hpf   Bacteria, UA RARE  RARE  MAGNESIUM     Status: None   Collection Time    09/27/14  6:14 PM      Result Value Ref Range   Magnesium 2.1  1.5 - 2.5 mg/dL  TSH     Status: None   Collection Time    09/27/14  8:06 PM      Result Value Ref Range   TSH 1.540  0.350 - 4.500 uIU/mL  SEDIMENTATION RATE     Status: None   Collection Time    09/27/14  8:06 PM      Result Value Ref Range   Sed Rate 10  0 - 16 mm/hr  CEA     Status: None   Collection Time    09/27/14  8:06 PM      Result Value Ref Range   CEA 0.6  0.0 - 5.0 ng/mL   Comment: Performed at [facility]  PSA     Status: None   Collection Time    09/27/14  8:06 PM      Result Value Ref Range   PSA 1.29  <=4.00 ng/mL   Comment: (NOTE)     Test Methodology: ECLIA PSA (Electrochemiluminescence Immunoassay)     For PSA values from 2.5-4.0, particularly in younger men <60 years     old, the AUA and NCCN suggest testing for % Free PSA (3515) and     evaluation of the rate of increase in PSA (PSA velocity).     Performed at [facility]     Status: Abnormal   Collection Time    09/28/14  3:45 AM      Result Value Ref Range   Sodium 142  137 - 147 mEq/L   Potassium 3.2 (*) 3.7 - 5.3 mEq/L   Chloride 96  96 - 112 mEq/L   CO2 37 (*) 19 - 32 mEq/L   Glucose, Bld 80  70 - 99 mg/dL   BUN 23  6 - 23 mg/dL   Creatinine, Ser 2.65 (*) 0.50 - 1.35 mg/dL   Calcium 9.7  8.4 - 10.5 mg/dL   Total Protein 6.1  6.0 - 8.3 g/dL   Albumin 3.5  3.5 - 5.2 g/dL   AST 20  0 - 37 U/L   ALT 12  0 - 53 U/L   Alkaline Phosphatase 65  39 - 117 U/L   Total Bilirubin 1.1  0.3 - 1.2 mg/dL   GFR calc non Af Amer 22 (*) >90 mL/min   GFR calc Af Amer 25 (*) >90 mL/min   Comment: (NOTE)     The eGFR has been calculated using the CKD EPI equation.     This calculation has not been validated in all clinical situations.     eGFR's persistently <90 mL/min signify possible Chronic Kidney      Disease.   Anion gap 9  5 - 15  CBC  Status: Abnormal   Collection Time    09/28/14  3:45 AM      Result Value Ref Range   WBC 6.6  4.0 - 10.5 K/uL   RBC 3.35 (*) 4.22 - 5.81 MIL/uL   Hemoglobin 10.7 (*) 13.0 - 17.0 g/dL   HCT 32.7 (*) 39.0 - 52.0 %   MCV 97.6  78.0 - 100.0 fL   MCH 31.9  26.0 - 34.0 pg   MCHC 32.7  30.0 - 36.0 g/dL   RDW 14.0  11.5 - 15.5 %   Platelets 86 (*) 150 - 400 K/uL   Comment: CONSISTENT WITH PREVIOUS RESULT  PROTIME-INR     Status: Abnormal   Collection Time    09/28/14  3:45 AM      Result Value Ref Range   Prothrombin Time 39.0 (*) 11.6 - 15.2 seconds   INR 4.00 (*) 0.00 - 1.49  VITAMIN B12     Status: None   Collection Time    09/28/14 10:11 AM      Result Value Ref Range   Vitamin B-12 664  211 - 911 pg/mL   Comment: Performed at [facility]     Status: Abnormal   Collection Time    09/29/14  3:36 AM      Result Value Ref Range   Prothrombin Time 37.5 (*) 11.6 - 15.2 seconds   INR 3.81 (*) 0.00 - 1.49    Imaging: Ct Abdomen Pelvis Wo Contrast  09/27/2014   CLINICAL DATA:  Weight loss. History of rectal and colon cancer post colostomy.  EXAM: CT ABDOMEN AND PELVIS WITHOUT CONTRAST  TECHNIQUE: Multidetector CT imaging of the abdomen and pelvis was performed following the standard protocol without IV contrast.  COMPARISON:  11/20/2013  FINDINGS: Technically limited study due to motion artifact and streak artifact.  Bilateral pleural effusions, greater on the right. Atelectasis in the lung bases. Postoperative changes in the mediastinum. Calcification of the native Coronary arteries.  The unenhanced appearance of the liver, spleen, gallbladder, pancreas, adrenal glands, inferior vena cava, and retroperitoneal lymph nodes is unremarkable. Extensive calcification throughout the abdominal aorta without aneurysm. Bilateral renal atrophy, greater on the right. Low-attenuation mass lesions are arising from the upper pole of the left  kidney likely representing cysts. No significant change in size since previous study. No hydronephrosis or hydroureter. Stomach, small bowel, and colon are not abnormally distended. Sigmoid colonic resection with left lower quadrant colostomy. There is of peristomal hernia containing small bowel and colon. There is an additional ventral anterior abdominal wall hernia containing colon. Contrast material passes through this area suggesting no evidence of obstruction. No free air or free fluid in the abdomen.  Pelvis: Postoperative changes in the pelvis with rectal resection. Soft tissue thickening and infiltration in the presacral space appears unchanged since previous study, likely postoperative. Small amount of free fluid in the pelvis is nonspecific. Prostate gland is enlarged. Bladder wall is not thickened. Spondylolysis with mild spondylolisthesis at L5-S1. Diffuse degenerative changes throughout the cervical spine. No destructive bone lesions appreciated.  IMPRESSION: Surgical resection of the rectosigmoid colon with left lower quadrant colostomy. Peristomal and anterior abdominal wall hernias containing colon and small bowel without evidence of obstruction. Small amount of free fluid in the pelvis is nonspecific. Bilateral renal atrophy with probable left renal cysts. Bilateral pleural effusions with basilar atelectasis.   Electronically Signed   By: Lucienne Capers M.D.   On: 09/27/2014 23:36   Ct Head Wo  Contrast  09/27/2014   CLINICAL DATA:  Increasing weakness and fatigue over the past several weeks ; right arm has a heavy feeling; history of CHF and MI and coronary artery bypass grafting  EXAM: CT HEAD WITHOUT CONTRAST  TECHNIQUE: Contiguous axial images were obtained from the base of the skull through the vertex without intravenous contrast.  COMPARISON:  None.  FINDINGS: There is mild age appropriate diffuse cerebral and cerebellar atrophy. The ventricles are normal in size and position. There is no  intracranial hemorrhage nor intracranial mass effect. There is decreased density in the deep white matter of both cerebral hemispheres consistent with chronic small vessel ischemia.  There is postsurgical change associated with the right maxillary sinus with thickening of the lateral sinus wall. No air-fluid levels are demonstrated. The calvarium is intact.  IMPRESSION: 1. There is no acute intracranial hemorrhage nor evidence of acute ischemic change. 2. There are changes of chronic small vessel ischemia in the deep white matter of both cerebral hemispheres. There is also mild age appropriate diffuse atrophy. 3. There is no acute abnormality of the calvarium.   Electronically Signed   By: David  Martinique   On: 09/27/2014 13:37   Dg Chest Port 1 View  09/27/2014   CLINICAL DATA:  Weakness for several weeks. Progressive dyspnea. Progressive shortness of breath.  EXAM: PORTABLE CHEST - 1 VIEW  COMPARISON:  09/25/2014.  FINDINGS: Lower lung volumes with cardiomegaly. CABG. Monitoring leads project over the chest. Small RIGHT pleural effusion. Pulmonary vascular congestion. LEFT shoulder hemiarthroplasty it RIGHT shoulder osteoarthritis.  IMPRESSION: Mild CHF with small RIGHT pleural effusion.   Electronically Signed   By: Dereck Ligas M.D.   On: 09/27/2014 12:15    Assessment:  Principal Problem:   Acute on chronic diastolic HF (heart failure) Active Problems:   Essential hypertension   Atrial fibrillation with slow ventricular response   Weakness generalized   CKD (chronic kidney disease) stage 4, GFR 15-29 ml/min   Plan:  1. Weight down, however, i's/o's positive yesterday. Continues to have a-fib with slow ventricular response and pauses up to 5 seconds. He is fatigued, which I suspect is related to this, although it was present with a faster HR as well. Agree with transfer to stepdown. Will need EP consult in the am for pacemaker. Continue diuresis. Keep NPO p MN.    Time Spent Directly with  Patient:  15 minutes  Length of Stay:  LOS: 2 days   Pixie Casino, MD, [facility] Attending Cardiologist [facility]  Alyssamae Klinck C 09/29/2014, 8:33 AM

## 2014-09-29 NOTE — Progress Notes (Signed)
Notified by primary RN of patient with two pauses around 5 seconds. He remains asymptomatic. Vitals reviewed; BP 151/48  Pulse 42  Temp(Src) 98 F (36.7 C) (Oral)  Resp 18  Ht 5\' 9"  (1.753 m)  Wt 75.841 kg (167 lb 3.2 oz)  BMI 24.68 kg/m2  SpO2 99%  Given persistently low HR and pauses will transfer to stepdown.   Jules Husbands ,MD

## 2014-09-29 NOTE — Progress Notes (Signed)
Terminous for Coumadin Indication: h/o atrial fibrillation  Allergies  Allergen Reactions  . Cephalexin Other (See Comments)    "too strong for my system" - per office note 05/05/2012  . Ciprofloxacin Other (See Comments)    Reaction unknown  . Hctz [Hydrochlorothiazide]     DIZZINESS   . Levofloxacin Other (See Comments)    "too strong for my system" - per office note 05/05/2012  . Lipitor [Atorvastatin Calcium]     MUSCLE PAIN  . Niaspan [Niacin Er]     FLUSHING   . Oxycodone-Acetaminophen Other (See Comments)    Reaction unknown  . Sulfonamide Derivatives Other (See Comments)    Reaction unkonwn    Labs:  Recent Labs  09/27/14 1155 09/28/14 0345 09/29/14 0336  HGB 11.9* 10.7*  --   HCT 36.0* 32.7*  --   PLT 102* 86*  --   LABPROT 35.9* 39.0* 37.5*  INR 3.60* 4.00* 3.81*  CREATININE 2.63* 2.65*  --   TROPONINI <0.30  --   --     Estimated Creatinine Clearance: 23.7 ml/min (by C-G formula based on Cr of 2.65).   Assessment: 80 y.o male on chronic coumadin PTA for atrial fibrillation.   PTA dose of coumadin:   3 mg daily except 4.5 mg qWed.- last dose taken on 09/26/14.  Today on admission his INR is 3.6, SUPRAtherapeutic.  No bleeding reported. Last anticoag office visit note on 9/17 indicates INR was 3.2. At that time the patient was instructed to skip that day's dose then resume usual regimen of 3mg  daily except 4.5 mg qWed.  On 08/15/14 anticoag visit, the INR was 2.9 and on 07/25/14 the INR was 2.8 on this same dosage regimen.     INR 3.81  today still supra-therapeutic -   The patient has a PMH of CK stage III, chronic CHF and history of colon cancer status post colectomy with colostomy and other PMH as noted above. He presented to ED today with shortness of breath about 5 days ago, worsening about every day, he also reported generalized weakness to the point he couldn't change his colostomy bag. Patient reported significant  weight loss since last year. He was admitted for acute on chronic diastolic HF  CKD stage IV , has only one kidney working per pt's report, secondary to kidney stone and infection.  SCr = 2.65, baseline improved, he was 3.6 in March of 2015.   Goal of Therapy:  INR 2-3 Monitor platelets by anticoagulation protocol: Yes   Plan:  Hold coumadin again today PT/INR daily.   Bonnita Nasuti Pharm.D. CPP, BCPS Clinical Pharmacist [phone] 09/29/2014 1:01 PM

## 2014-09-29 NOTE — Consult Note (Signed)
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 male.   HPI: The patient is a 80 yo man who was admitted to the hospital with worsening CHF. He has an EF of 40%, down from 50%. He has had longstanding bradycardia which has been worsening and earlier noted to have a 5 second pause. In addition he has very dense ventricular bigeminy making his effective HR even lower. He has class 3 symptoms. His very low dose of toprol was held after his dose yesterday. He denies any syncope.   PMH: Past Medical History  Diagnosis Date  . 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 Pure hypercholesterolemia     takes Vytorin daily  . Personal history of colonic polyps     adenomatous  . Diverticulosis of colon (without mention of hemorrhage)   . Cardiovascular disease   . IHD (ischemic heart disease)   . History of colon cancer   . Colon cancer   . Hypertension     takes Amlodipine,Metoprolol,and Cardura daily  . Coronary artery disease   . Acute myocardial infarction, unspecified site, episode of care unspecified   . MI (myocardial infarction) 1993  . CHF (congestive heart failure)     takes Lasix daily  . H/O blood clots 1993  . Dysrhythmia   . Shortness of breath     with exertion  . Arthritis   . Joint pain   . Joint swelling   . Edema     to right leg-below knee;not any different than when he saw brackbill in jan 2013  . Bruises easily     takes ASA daily  . Skin cancer     nose  . GERD (gastroesophageal reflux disease)     takes Protonix daily  . Diarrhea   . Constipation   . Enlarged prostate     takes Finasteride daily  . Blood transfusion 2008  . Insomnia   . Hx of CABG     1993  . Ejection fraction     EF 51%, nuclear, 2010  . Pyelonephritis 11/20/2013  . CKD (chronic kidney disease) stage 4, GFR 15-29 ml/min     PSHX: Past Surgical History  Procedure  Laterality Date  . Shoulder surgery  2007    left  . Cataract extraction      right  . Abdominoperineal proctocolectomy  06/01/2007  . Coronary angioplasty  05/22/92  . Colostomy  2008  . Colonoscopy    . Coronary artery bypass graft  1993  . Colon surgery  2008    colectomy  . Knee arthroscopy  2010    right   . Total knee arthroplasty  02/28/2012    Procedure: TOTAL KNEE ARTHROPLASTY;  Surgeon: Rudean Haskell, MD;  Location: [facility];  Service: Orthopedics;  Laterality: Right;    FAMHX: Family History  Problem Relation Age of Onset  . Pancreatic cancer Father   . Diabetes Son   . Emphysema Mother   . Anesthesia problems Neg Hx   . Hypotension Neg Hx   . Malignant hyperthermia Neg Hx   . Pseudochol deficiency Neg Hx     Social History:  reports that he quit smoking about 23 years ago. His smoking use included Pipe and Cigars. His smokeless tobacco use includes Chew. He reports that he does not drink alcohol or use illicit drugs.  Allergies:  Allergies  Allergen Reactions  . Cephalexin Other (See Comments)    "too strong for my system" - per office note 05/05/2012  . Ciprofloxacin Other (See Comments)    Reaction unknown  . Hctz [Hydrochlorothiazide]     DIZZINESS   . Levofloxacin Other (See Comments)    "too strong for my system" - per office note 05/05/2012  . Lipitor [Atorvastatin Calcium]     MUSCLE PAIN  . Niaspan [Niacin Er]     FLUSHING   . Oxycodone-Acetaminophen Other (See Comments)    Reaction unknown  . Sulfonamide Derivatives Other (See Comments)    Reaction unkonwn    Medications: reviewed  Ct Abdomen Pelvis Wo Contrast  09/27/2014   CLINICAL DATA:  Weight loss. History of rectal and colon cancer post colostomy.  EXAM: CT ABDOMEN AND PELVIS WITHOUT CONTRAST  TECHNIQUE: Multidetector CT imaging of the abdomen and pelvis was performed following the standard protocol without IV contrast.  COMPARISON:  11/20/2013  FINDINGS: Technically limited study due to  motion artifact and streak artifact.  Bilateral pleural effusions, greater on the right. Atelectasis in the lung bases. Postoperative changes in the mediastinum. Calcification of the native Coronary arteries.  The unenhanced appearance of the liver, spleen, gallbladder, pancreas, adrenal glands, inferior vena cava, and retroperitoneal lymph nodes is unremarkable. Extensive calcification throughout the abdominal aorta without aneurysm. Bilateral renal atrophy, greater on the right. Low-attenuation mass lesions are arising from the upper pole of the left kidney likely representing cysts. No significant change in size since previous study. No hydronephrosis or hydroureter. Stomach, small bowel, and colon are not abnormally distended. Sigmoid colonic resection with left lower quadrant colostomy. There is of peristomal hernia containing small bowel and colon. There is an additional ventral anterior abdominal wall hernia containing colon. Contrast material passes through this area suggesting no evidence of obstruction. No free air or free fluid in the abdomen.  Pelvis: Postoperative changes in the pelvis with rectal resection. Soft tissue thickening and infiltration in the presacral space appears unchanged since previous study, likely postoperative. Small amount of free fluid in the pelvis is nonspecific. Prostate gland is enlarged. Bladder wall is not thickened. Spondylolysis with mild spondylolisthesis at L5-S1. Diffuse degenerative changes throughout the cervical spine. No destructive bone lesions appreciated.  IMPRESSION: Surgical resection of the rectosigmoid colon with left lower quadrant colostomy. Peristomal and anterior abdominal wall hernias containing colon and small bowel without evidence of obstruction. Small amount of free fluid in the pelvis is nonspecific. Bilateral renal atrophy with probable left renal cysts. Bilateral pleural effusions with basilar atelectasis.   Electronically Signed   By: Lucienne Capers M.D.   On: 09/27/2014 23:36   Ct Head Wo Contrast  09/27/2014   CLINICAL DATA:  Increasing weakness and fatigue over the past several weeks ; right arm has a heavy feeling; history of CHF and MI and coronary artery bypass grafting  EXAM: CT HEAD WITHOUT CONTRAST  TECHNIQUE: Contiguous axial images were obtained from the base of the skull through the vertex without intravenous contrast.  COMPARISON:  None.  FINDINGS: There is mild age appropriate diffuse cerebral and cerebellar atrophy. The ventricles are normal in size and position. There is no intracranial hemorrhage nor intracranial mass effect. There is decreased density in the deep white matter of both cerebral hemispheres consistent with chronic small vessel ischemia.  There is postsurgical change associated with the right maxillary sinus with thickening of the lateral sinus wall. No air-fluid levels are demonstrated. The calvarium is  intact.  IMPRESSION: 1. There is no acute intracranial hemorrhage nor evidence of acute ischemic change. 2. There are changes of chronic small vessel ischemia in the deep white matter of both cerebral hemispheres. There is also mild age appropriate diffuse atrophy. 3. There is no acute abnormality of the calvarium.   Electronically Signed   By: David  Martinique   On: 09/27/2014 13:37    ROS  As stated in the HPI and negative for all other systems.  Physical Exam  Vitals:Blood pressure 142/68, pulse 50, temperature 98.2 F (36.8 C), temperature source Oral, resp. rate 18, height 5\' 10"  (1.778 m), weight 178 lb 9.2 oz (81 kg), SpO2 99.00%.  Stable but now well appearing, NAD HEENT: Unremarkable Neck:  No JVD, no thyromegally Back:  No CVA tenderness Lungs:  Clear with no wheezes HEART:  IRegular brady rhythm, no murmurs, no rubs, no clicks Abd:  soft, positive bowel sounds, no organomegally, no rebound, no guarding Ext:  2 plus pulses, no edema, no cyanosis, no clubbing Skin:  No rashes no nodules Neuro:   CN II through XII intact, motor grossly intact  ECG - atrial fib with a slow VR and ventricular bigeminy  Assessment/Plan: 1. Symptomatic bradycardia on very low dose toprol 2. Acute on chronic systolic and diastolic CHF 3. Acute on chronic stage 4 renal insufficiency 4. Chronic atrial fibrillation with tachy/brady Rec: A difficult combination of problems. I have reviewed his chart in detail. If he has persistent bradycardia or develops a rapid ventricular rate now that he is off of toprol, PPM insertion would be recommended. Because of his LV dysfunction and need to pace most of the time, a BiV PM would be a very strong consideration. We would do this with minimal or no contrast.  Nicholas Meuth TaylorMD 09/29/2014, 12:20 PM

## 2014-09-29 NOTE — Progress Notes (Addendum)
[facility] PROGRESS NOTE Interim History: 80 y.o. male with past medical history of CK stage III, chronic CHF and history of colon cancer status post colectomy with colostomy. Patient came in to the hospital because of shortness of breath. Patient started to have shortness of breath about 5 days ago, worsening about every day, he also reported generalized weakness to the point he couldn't change his colostomy bag. Patient reported significant weight loss since last year. His wife brought him to the hospital for further evaluation.  In the ED he was found to have creatinine of 2.6, baseline creatinine is 3.6 from March of 2015, his weight is 180 patient weighed 230 lbs in July of 2014.   Filed Weights   09/27/14 1900 09/28/14 0457 09/29/14 0505  Weight: 78.2 kg (172 lb 6.4 oz) 77.474 kg (170 lb 12.8 oz) 75.841 kg (167 lb 3.2 oz)        Intake/Output Summary (Last 24 hours) at 09/29/14 0739 Last data filed at 09/29/14 0998  Gross per 24 hour  Intake   2103 ml  Output    450 ml  Net   1653 ml     Assessment/Plan: Weakness generalized due to a. fib with slow ventricular response: - Most likely due to bradycardia < 30s and 5.0s pauses. - Pt consult. Patient weighed 230 in July 2014, current weight is 180. - cardiology on board. Off betablocker for more than 24hrs.  Acute on chronic diastolic HF (heart failure)/Acute respiratory failure: - Change to oral lasix. - Strict I and O's. Monitor electrolytes. NO JVD on physical exam, 3+ edema. - Weight on admission 78 kg now 77.4 kg.  CKD (chronic kidney disease) stage 4, GFR 15-29 ml/min - Baseline Cr 2.6-3.0. - cont to hold benazepril.  Atrial fibrillation - Rate controlled. - INR supra therapeutic. - coumadin per pharmacy.  Essential hypertension - mildly elevated     Code Status: Full code  Family Communication: Plan discussed with the patient in the presence of his wife at bedside.  Disposition Plan: Inpatient,  telemetry    Consultants:  cardiology  Procedures: ECHO: estimated ejection fraction was in the range of 40% to 45%. Severe hypokinesis of the basal-midinferolateral, inferior, and inferoseptal myocardium. The study is not technically sufficient to allow evaluation of LV diastolic function.  Antibiotics:  None  HPI/Subjective: Still sleepy.  Objective: Filed Vitals:   09/28/14 1656 09/28/14 2019 09/29/14 0215 09/29/14 0505  BP: 139/49 131/49 134/46 151/48  Pulse: 61 115 33 42  Temp:  97.5 F (36.4 C)  98 F (36.7 C)  TempSrc:  Oral  Oral  Resp:  18  18  Height:      Weight:    75.841 kg (167 lb 3.2 oz)  SpO2:  99% 99% 99%     Exam:  General: Alert, awake, oriented x3, in no acute distress.  HEENT: No bruits, no goiter. -JVD Heart: irregular rate and rhythm. 3+ edema. Lungs: Good air movement, clear Abdomen: Soft, nontender, nondistended, positive bowel sounds.   Data Reviewed: Basic Metabolic Panel:  Recent Labs Lab 09/27/14 1155 09/27/14 1814 09/28/14 0345  NA 143  --  142  K 3.2*  --  3.2*  CL 95*  --  96  CO2 35*  --  37*  GLUCOSE 104*  --  80  BUN 22  --  23  CREATININE 2.63*  --  2.65*  CALCIUM 10.3  --  9.7  MG  --  2.1  --  Liver Function Tests:  Recent Labs Lab 09/28/14 0345  AST 20  ALT 12  ALKPHOS 65  BILITOT 1.1  PROT 6.1  ALBUMIN 3.5   No results found for this basename: LIPASE, AMYLASE,  in the last 168 hours No results found for this basename: AMMONIA,  in the last 168 hours CBC:  Recent Labs Lab 09/27/14 1155 09/28/14 0345  WBC 6.4 6.6  HGB 11.9* 10.7*  HCT 36.0* 32.7*  MCV 97.6 97.6  PLT 102* 86*   Cardiac Enzymes:  Recent Labs Lab 09/27/14 1155  TROPONINI <0.30   BNP (last 3 results)  Recent Labs  09/27/14 1155  PROBNP 7803.0*   CBG: No results found for this basename: GLUCAP,  in the last 168 hours  No results found for this or any previous visit (from the past 240 hour(s)).   Studies: Ct  Abdomen Pelvis Wo Contrast  09/27/2014   CLINICAL DATA:  Weight loss. History of rectal and colon cancer post colostomy.  EXAM: CT ABDOMEN AND PELVIS WITHOUT CONTRAST  TECHNIQUE: Multidetector CT imaging of the abdomen and pelvis was performed following the standard protocol without IV contrast.  COMPARISON:  11/20/2013  FINDINGS: Technically limited study due to motion artifact and streak artifact.  Bilateral pleural effusions, greater on the right. Atelectasis in the lung bases. Postoperative changes in the mediastinum. Calcification of the native Coronary arteries.  The unenhanced appearance of the liver, spleen, gallbladder, pancreas, adrenal glands, inferior vena cava, and retroperitoneal lymph nodes is unremarkable. Extensive calcification throughout the abdominal aorta without aneurysm. Bilateral renal atrophy, greater on the right. Low-attenuation mass lesions are arising from the upper pole of the left kidney likely representing cysts. No significant change in size since previous study. No hydronephrosis or hydroureter. Stomach, small bowel, and colon are not abnormally distended. Sigmoid colonic resection with left lower quadrant colostomy. There is of peristomal hernia containing small bowel and colon. There is an additional ventral anterior abdominal wall hernia containing colon. Contrast material passes through this area suggesting no evidence of obstruction. No free air or free fluid in the abdomen.  Pelvis: Postoperative changes in the pelvis with rectal resection. Soft tissue thickening and infiltration in the presacral space appears unchanged since previous study, likely postoperative. Small amount of free fluid in the pelvis is nonspecific. Prostate gland is enlarged. Bladder wall is not thickened. Spondylolysis with mild spondylolisthesis at L5-S1. Diffuse degenerative changes throughout the cervical spine. No destructive bone lesions appreciated.  IMPRESSION: Surgical resection of the  rectosigmoid colon with left lower quadrant colostomy. Peristomal and anterior abdominal wall hernias containing colon and small bowel without evidence of obstruction. Small amount of free fluid in the pelvis is nonspecific. Bilateral renal atrophy with probable left renal cysts. Bilateral pleural effusions with basilar atelectasis.   Electronically Signed   By: Lucienne Capers M.D.   On: 09/27/2014 23:36   Ct Head Wo Contrast  09/27/2014   CLINICAL DATA:  Increasing weakness and fatigue over the past several weeks ; right arm has a heavy feeling; history of CHF and MI and coronary artery bypass grafting  EXAM: CT HEAD WITHOUT CONTRAST  TECHNIQUE: Contiguous axial images were obtained from the base of the skull through the vertex without intravenous contrast.  COMPARISON:  None.  FINDINGS: There is mild age appropriate diffuse cerebral and cerebellar atrophy. The ventricles are normal in size and position. There is no intracranial hemorrhage nor intracranial mass effect. There is decreased density in the deep white matter of both cerebral  hemispheres consistent with chronic small vessel ischemia.  There is postsurgical change associated with the right maxillary sinus with thickening of the lateral sinus wall. No air-fluid levels are demonstrated. The calvarium is intact.  IMPRESSION: 1. There is no acute intracranial hemorrhage nor evidence of acute ischemic change. 2. There are changes of chronic small vessel ischemia in the deep white matter of both cerebral hemispheres. There is also mild age appropriate diffuse atrophy. 3. There is no acute abnormality of the calvarium.   Electronically Signed   By: David  Martinique   On: 09/27/2014 13:37   Dg Chest Port 1 View  09/27/2014   CLINICAL DATA:  Weakness for several weeks. Progressive dyspnea. Progressive shortness of breath.  EXAM: PORTABLE CHEST - 1 VIEW  COMPARISON:  09/25/2014.  FINDINGS: Lower lung volumes with cardiomegaly. CABG. Monitoring leads project  over the chest. Small RIGHT pleural effusion. Pulmonary vascular congestion. LEFT shoulder hemiarthroplasty it RIGHT shoulder osteoarthritis.  IMPRESSION: Mild CHF with small RIGHT pleural effusion.   Electronically Signed   By: Dereck Ligas M.D.   On: 09/27/2014 12:15    Scheduled Meds: . amoxicillin-clavulanate  500 mg Oral Q12H  . feeding supplement (ENSURE COMPLETE)  237 mL Oral Q24H  . feeding supplement (ENSURE)  1 Container Oral Q24H  . finasteride  5 mg Oral Daily  . furosemide  40 mg Intravenous BID  . pantoprazole  40 mg Oral Daily  . sodium chloride  3 mL Intravenous Q12H  . Warfarin - Pharmacist Dosing Inpatient   Does not apply q1800   Continuous Infusions:    Charlynne Cousins  [facility] Pager [phone]  If 8PM-8AM, please contact night-coverage at [email or web address], password [facility] 09/29/2014, 7:39 AM  LOS: 2 days     **Disclaimer: This note may have been dictated with voice recognition software. Similar sounding words can inadvertently be transcribed and this note may contain transcription errors which may not have been corrected upon publication of note.**

## 2014-09-29 NOTE — Progress Notes (Signed)
The patient had two pauses a few seconds apart that caused the heart monitor the ring off asystole.  One pause was 4.74 and the other was 4.49.  The patient's VS are otherwise stable and were charted.  His HR is in A. Fib and his HR was between the upper 20s to upper 40s at this time.  It is now up in the 50s-60s.  Both the [facility] and Cardiology were notified.

## 2014-09-29 NOTE — Progress Notes (Signed)
The patient's wife was notified about his move to [facility].

## 2014-09-29 NOTE — Progress Notes (Signed)
Report given to Penny, RN

## 2014-09-29 NOTE — Progress Notes (Signed)
The patient has had multiple pauses overnight above 4 seconds in which his telemetry rang off asystole.  He did not have these pauses the previous night.  The strips were saved by CCMD.  Jonette Eva was notified and rapid response was alerted to the patient's situation and status.  His BP was stable upon checking it after waking him up during these episodes.  His HR ranged anywhere from the upper 20s-70s overnight, but it dipped down into the 20s and 30s more frequently than the previous night.

## 2014-09-30 LAB — CBC
HEMATOCRIT: 33.6 % — AB (ref 39.0–52.0)
Hemoglobin: 10.9 g/dL — ABNORMAL LOW (ref 13.0–17.0)
MCH: 32.2 pg (ref 26.0–34.0)
MCHC: 32.4 g/dL (ref 30.0–36.0)
MCV: 99.4 fL (ref 78.0–100.0)
Platelets: 77 10*3/uL — ABNORMAL LOW (ref 150–400)
RBC: 3.38 MIL/uL — AB (ref 4.22–5.81)
RDW: 14.1 % (ref 11.5–15.5)
WBC: 5.4 10*3/uL (ref 4.0–10.5)

## 2014-09-30 LAB — BASIC METABOLIC PANEL
ANION GAP: 8 (ref 5–15)
BUN: 28 mg/dL — ABNORMAL HIGH (ref 6–23)
CHLORIDE: 95 meq/L — AB (ref 96–112)
CO2: 36 mEq/L — ABNORMAL HIGH (ref 19–32)
Calcium: 9.6 mg/dL (ref 8.4–10.5)
Creatinine, Ser: 2.7 mg/dL — ABNORMAL HIGH (ref 0.50–1.35)
GFR calc Af Amer: 24 mL/min — ABNORMAL LOW (ref >90)
GFR calc non Af Amer: 21 mL/min — ABNORMAL LOW (ref >90)
GLUCOSE: 102 mg/dL — AB (ref 70–99)
POTASSIUM: 3.9 meq/L (ref 3.7–5.3)
Sodium: 139 mEq/L (ref 137–147)

## 2014-09-30 LAB — PROTIME-INR
INR: 2.68 — AB (ref 0.00–1.49)
Prothrombin Time: 28.5 seconds — ABNORMAL HIGH (ref 11.6–15.2)

## 2014-09-30 MED ORDER — WARFARIN SODIUM 3 MG PO TABS
3.0000 mg | ORAL_TABLET | Freq: Once | ORAL | Status: DC
  Filled 2014-09-30: qty 1

## 2014-09-30 MED ORDER — CHLORHEXIDINE GLUCONATE 4 % EX LIQD
60.0000 mL | Freq: Once | CUTANEOUS | Status: AC
  Administered 2014-09-30: 4 via TOPICAL

## 2014-09-30 MED ORDER — SODIUM CHLORIDE 0.9 % IV SOLN
INTRAVENOUS | Status: DC
  Administered 2014-09-30: 13:00:00 via INTRAVENOUS

## 2014-09-30 MED ORDER — SODIUM CHLORIDE 0.9 % IR SOLN
80.0000 mg | Status: DC
  Filled 2014-09-30: qty 2

## 2014-09-30 MED ORDER — SODIUM CHLORIDE 0.9 % IV SOLN
250.0000 mL | INTRAVENOUS | Status: DC
Start: 2014-09-30 — End: 2014-10-01

## 2014-09-30 MED ORDER — CHLORHEXIDINE GLUCONATE 4 % EX LIQD
60.0000 mL | Freq: Once | CUTANEOUS | Status: AC
  Filled 2014-09-30: qty 30

## 2014-09-30 MED ORDER — SODIUM CHLORIDE 0.9 % IV SOLN
INTRAVENOUS | Status: DC
  Administered 2014-10-01: 04:00:00 via INTRAVENOUS

## 2014-09-30 MED ORDER — SODIUM CHLORIDE 0.9 % IJ SOLN
3.0000 mL | INTRAMUSCULAR | Status: DC | PRN
Start: 2014-09-30 — End: 2014-10-01

## 2014-09-30 MED ORDER — SODIUM CHLORIDE 0.9 % IJ SOLN
3.0000 mL | Freq: Two times a day (BID) | INTRAMUSCULAR | Status: DC
  Administered 2014-09-30 – 2014-10-01 (×3): 3 mL via INTRAVENOUS

## 2014-09-30 NOTE — Progress Notes (Signed)
[place] for Coumadin Indication: h/o atrial fibrillation  Allergies  Allergen Reactions  . Cephalexin Other (See Comments)    "too strong for my system" - per office note 05/05/2012  . Ciprofloxacin Other (See Comments)    Reaction unknown  . Hctz [Hydrochlorothiazide]     DIZZINESS   . Levofloxacin Other (See Comments)    "too strong for my system" - per office note 05/05/2012  . Lipitor [Atorvastatin Calcium]     MUSCLE PAIN  . Niaspan [Niacin Er]     FLUSHING   . Oxycodone-Acetaminophen Other (See Comments)    Reaction unknown  . Sulfonamide Derivatives Other (See Comments)    Reaction unkonwn    Labs:  Recent Labs  09/27/14 1155 09/28/14 0345 09/29/14 0336 09/30/14 0248  HGB 11.9* 10.7*  --  10.9*  HCT 36.0* 32.7*  --  33.6*  PLT 102* 86*  --  77*  LABPROT 35.9* 39.0* 37.5* 28.5*  INR 3.60* 4.00* 3.81* 2.68*  CREATININE 2.63* 2.65*  --  2.70*  TROPONINI <0.30  --   --   --     Estimated Creatinine Clearance: 23.3 ml/min (by C-G formula based on Cr of 2.7).   Assessment: 80 yo M admitted 09/27/2014  With 5 days of progressive SOB and weakness, unable to change his own colostomy bag.  Pharmacy consulted to continue warfarin for afib.    PMH of CK stage III, chronic CHF and history of colon cancer status post colectomy with colostomy . CKD stage IV one kidney. baseline SCr = 2.63  Coag:  Afib, INR now 2.6 without warfarin since admission.  CBC slight trend down  PTA dose of coumadin:   3 mg daily except 4.5 mg qWed.- last dose taken on 09/26/14. on admission his INR is 3.6, SUPRAtherapeutic.  No bleeding reported. Last anticoag office visit note on 9/17 indicates INR was 3.2. At that time the patient was instructed to skip that day's dose then resume usual regimen of 3mg  daily except 4.5 mg qWed.  On 08/15/14 anticoag visit, the INR was 2.9 and on 07/25/14 the INR was 2.8 on this same dosage regimen.     ID: On augmentin from  home wbc wnl, afebrile  Augmentin 9/29>  CV: afib, with bradycardia (attributing weakness to this) CHF exacerbation EF 55>40% in 1 yr with inferior wall hypoK  On: finasteride and po lasix  GI: Weight loss, colectomy with colostomy  CKD: CrCl ~ 20-25 ml/min, Lotensin on hold  Home meds: statin, vitamins, iron  Goal of Therapy:  INR 2-3 Monitor platelets by anticoagulation protocol: Yes   Plan:  1. Warfarin 3 mg today 2. Daily INR  Thank you for allowing pharmacy to be a part of this patients care team.  Rowe Robert Pharm.D., BCPS, AQ-Cardiology Clinical Pharmacist 09/30/2014 10:08 AM Pager: [phone] Phone: [phone]

## 2014-09-30 NOTE — Progress Notes (Signed)
Dr. Lovena Le saw the patient earlier. Nurse called me for update on pacemaker orders and to discuss the plan with the patient's family. Unfortunately the patient is allergic to Cephalexin and the standard antibiotic is Cefazolin. He does not recall the allergy - states one of his allergies made him very SOB and one of them made him hallucinate. His renal function might preclude Vancomycin. Dr. Lovena Le said he plans to think about this further and will make a decision later today. I answered the patient and family's questions to the best of my ability. He is on the add-on board for later. Dayna Dunn PA-C

## 2014-09-30 NOTE — Care Management Note (Addendum)
  Page 2 of 2   10/03/2014     10:41:01 AM CARE MANAGEMENT NOTE 10/03/2014  Patient:  Nicholas [name], Nicholas [name]   Account Number:  [id number]  Date Initiated:  09/30/2014  Documentation initiated by:  Elissa Hefty  Subjective/Objective Assessment:   adm w heart failure     Action/Plan:   lives w wife, pcp  dr Gwyneth Revels   Anticipated DC Date:  10/04/2014   Anticipated DC Plan:  [place] referral  Clinical Social Worker      DC Forensic scientist  CM consult      [facility] Choice  HOME HEALTH   Choice offered to / List presented to:  C-1 Patient        [place] arranged  HH-1 RN  HH-10 DISEASE MANAGEMENT  HH-2 PT      Status of service:  Completed, signed off Medicare Important Message given?  YES (If response is "NO", the following Medicare IM given date fields will be blank) Date Medicare IM given:  09/30/2014 Medicare IM given by:  Elissa Hefty Date Additional Medicare IM given:  10/03/2014 Additional Medicare IM given by:  Aiken Withem  Discharge Disposition:  [place]  Per UR Regulation:  Reviewed for med. necessity/level of care/duration of stay  If discussed at [place] of Stay Meetings, dates discussed:   10/03/2014    Comments:  Mariann Laster RN, BSN, [place], CCM  Nurse - Case Manager,  ([phone]  10/03/2014 Social:  From home PCG:  Jonta, [place] Spouse [phone]   [phone] Hx/o past HHS with [facility] and elects the same. PT RECS:  HH PT Dispo Plan:  HHS:  RN, PT ([facility]/Donna  notified) - Face to face and HH order in place Continue to evaluate for home O2 needs - currently wearing oxygen  10/6 1333 debbie dowell rn,bsn pt had been siting in recliner for about 2 weeks prior to adm. may need short term snf. have req phy ther after pt gets pacer to see what dc needs pt will have.

## 2014-09-30 NOTE — Progress Notes (Signed)
[facility] PROGRESS NOTE Interim History: 80 y.o. male with past medical history of CK stage III, chronic CHF and history of colon cancer status post colectomy with colostomy. Patient came in to the hospital because of shortness of breath. Patient started to have shortness of breath about 5 days ago, worsening about every day, he also reported generalized weakness to the point he couldn't change his colostomy bag. Patient reported significant weight loss since last year. His wife brought him to the hospital for further evaluation.  In the ED he was found to have creatinine of 2.6, baseline creatinine is 3.6 from March of 2015, his weight is 180 patient weighed 230 lbs in July of 2014.   Filed Weights   09/28/14 0457 09/29/14 0505 09/29/14 0900  Weight: 77.474 kg (170 lb 12.8 oz) 75.841 kg (167 lb 3.2 oz) 81 kg (178 lb 9.2 oz)        Intake/Output Summary (Last 24 hours) at 09/30/14 0746 Last data filed at 09/30/14 0400  Gross per 24 hour  Intake    902 ml  Output    630 ml  Net    272 ml     Assessment/Plan: Weakness generalized due to a. fib with slow ventricular response: - Most likely due to bradycardia < 30s and 5.0s pauses. - Pt consult.  - Appreciate cardiology on board. Off betablocker for more than 24hrs. - INR 2.6.  Acute on chronic diastolic HF (heart failure)/Acute respiratory failure: - Change to oral lasix. - Strict I and O's. Monitor electrolytes. NO JVD on physical exam, 3+ edema. - Weight on admission 78 kg now 77.4 kg.  CKD (chronic kidney disease) stage 4, GFR 15-29 ml/min - Baseline Cr 2.6-3.0. - cont to hold benazepril.  Essential hypertension - mildly elevated     Code Status: Full code  Family Communication: Plan discussed with the patient in the presence of his wife at bedside.  Disposition Plan: Inpatient, telemetry    Consultants:  cardiology  Procedures: ECHO: estimated ejection fraction was in the range of 40% to 45%. Severe  hypokinesis of the basal-midinferolateral, inferior, and inferoseptal myocardium. The study is not technically sufficient to allow evaluation of LV diastolic function.  Antibiotics:  None  HPI/Subjective: No complains  Objective: Filed Vitals:   09/30/14 0537 09/30/14 0544 09/30/14 0614 09/30/14 0717  BP:  104/55 170/86 149/67  Pulse: 137  167 43  Temp:    97.5 F (36.4 C)  TempSrc:    Oral  Resp:      Height:      Weight:      SpO2: 100%  100% 100%     Exam:  General: in no acute distress.  HEENT: No bruits, no goiter. -JVD Heart: irregular rate and rhythm. 3+ edema. Lungs: Good air movement, clear Abdomen: Soft, nontender,   Data Reviewed: Basic Metabolic Panel:  Recent Labs Lab 09/27/14 1155 09/27/14 1814 09/28/14 0345 09/30/14 0248  NA 143  --  142 139  K 3.2*  --  3.2* 3.9  CL 95*  --  96 95*  CO2 35*  --  37* 36*  GLUCOSE 104*  --  80 102*  BUN 22  --  23 28*  CREATININE 2.63*  --  2.65* 2.70*  CALCIUM 10.3  --  9.7 9.6  MG  --  2.1  --   --    Liver Function Tests:  Recent Labs Lab 09/28/14 0345  AST 20  ALT 12  ALKPHOS 65  BILITOT 1.1  PROT 6.1  ALBUMIN 3.5   No results found for this basename: LIPASE, AMYLASE,  in the last 168 hours No results found for this basename: AMMONIA,  in the last 168 hours CBC:  Recent Labs Lab 09/27/14 1155 09/28/14 0345 09/30/14 0248  WBC 6.4 6.6 5.4  HGB 11.9* 10.7* 10.9*  HCT 36.0* 32.7* 33.6*  MCV 97.6 97.6 99.4  PLT 102* 86* 77*   Cardiac Enzymes:  Recent Labs Lab 09/27/14 1155  TROPONINI <0.30   BNP (last 3 results)  Recent Labs  09/27/14 1155  PROBNP 7803.0*   CBG: No results found for this basename: GLUCAP,  in the last 168 hours  Recent Results (from the past 240 hour(s))  MRSA PCR SCREENING     Status: None   Collection Time    09/29/14  8:55 AM      Result Value Ref Range Status   MRSA by PCR NEGATIVE  NEGATIVE Final   Comment:            The GeneXpert MRSA Assay (FDA       approved for NASAL specimens     only), is one component of a     comprehensive MRSA colonization     surveillance program. It is not     intended to diagnose MRSA     infection nor to guide or     monitor treatment for     MRSA infections.     Studies: No results found.  Scheduled Meds: . amoxicillin-clavulanate  500 mg Oral Q12H  . atropine  0.4 mg Intravenous Once  . feeding supplement (ENSURE COMPLETE)  237 mL Oral Q24H  . feeding supplement (ENSURE)  1 Container Oral Q24H  . finasteride  5 mg Oral Daily  . furosemide  40 mg Oral Daily  . pantoprazole  40 mg Oral Daily  . sodium chloride  3 mL Intravenous Q12H  . Warfarin - Pharmacist Dosing Inpatient   Does not apply q1800   Continuous Infusions:    Charlynne Cousins  [facility] Pager [phone]  If 8PM-8AM, please contact night-coverage at [email or web address], password [facility] 09/30/2014, 7:46 AM  LOS: 3 days     **Disclaimer: This note may have been dictated with voice recognition software. Similar sounding words can inadvertently be transcribed and this note may contain transcription errors which may not have been corrected upon publication of note.**

## 2014-09-30 NOTE — Progress Notes (Signed)
PT Cancellation Note  Patient Details Name: Nicholas [name] MRN: [id number] DOB: 1934-08-08   Cancelled Treatment:    Reason Eval/Treat Not Completed: Medical issues which prohibited therapy. Discussed pt case with RN who states pt is not appropriate for PT at this time. Will attempt again tomorrow.    Rolinda Roan 09/30/2014, 12:06 PM  Rolinda Roan, PT, DPT Acute Rehabilitation Services Pager: [phone]

## 2014-09-30 NOTE — Progress Notes (Signed)
Patient having multiple 5 seconds pauses this morning  whiles sleeping. Pt awakes easily and is asymptomatic. Will continue to monitor

## 2014-09-30 NOTE — Consult Note (Addendum)
WOC wound consult note Reason for Consult: Consult requested for buttocks. Pt states he spends a large amt of time in the recliner chair prior to admission and developed a pressure ulcer.  He uses barrier cream when at home. Wound type: Stage 2 to right buttock Pressure Ulcer POA: Yes Measurement: .8X.8X.1cm Wound bed: pink and moist Drainage (amount, consistency, odor) no odor or drainage  Periwound: Slight raised callous surrounding, appearance consistent with frequent shear in addition to prolonged moisture and pressure. Dressing procedure/placement/frequency: Foam dressing to protect and promote healing.  Pt is on a Sport low air loss bed to reduce pressure.  Discussed pressure ulcer etiology, topical treatment, and preventive measures with patient. He verbalizes understanding. Please re-consult if further assistance is needed.  Thank-you,  Julien Girt MSN, [place] [place], [place], [place]

## 2014-09-30 NOTE — Progress Notes (Signed)
Utilization Review Completed.[name] T10/04/2014

## 2014-10-01 ENCOUNTER — Encounter (HOSPITAL_COMMUNITY)
Admission: EM | Disposition: A | Discharge status: Home / Self Care | Payer: Self-pay | Source: Home / Self Care | Attending: Internal Medicine

## 2014-10-01 DIAGNOSIS — I442 Atrioventricular block, complete: Secondary | ICD-10-CM

## 2014-10-01 DIAGNOSIS — I5022 Chronic systolic (congestive) heart failure: Secondary | ICD-10-CM

## 2014-10-01 HISTORY — PX: BI-VENTRICULAR PACEMAKER INSERTION: SHX5462

## 2014-10-01 LAB — CBC
HEMATOCRIT: 32.2 % — AB (ref 39.0–52.0)
Hemoglobin: 10.7 g/dL — ABNORMAL LOW (ref 13.0–17.0)
MCH: 32.8 pg (ref 26.0–34.0)
MCHC: 33.2 g/dL (ref 30.0–36.0)
MCV: 98.8 fL (ref 78.0–100.0)
Platelets: 76 10*3/uL — ABNORMAL LOW (ref 150–400)
RBC: 3.26 MIL/uL — AB (ref 4.22–5.81)
RDW: 13.8 % (ref 11.5–15.5)
WBC: 5.3 10*3/uL (ref 4.0–10.5)

## 2014-10-01 LAB — BASIC METABOLIC PANEL
Anion gap: 9 (ref 5–15)
BUN: 35 mg/dL — AB (ref 6–23)
CO2: 34 mEq/L — ABNORMAL HIGH (ref 19–32)
CREATININE: 2.53 mg/dL — AB (ref 0.50–1.35)
Calcium: 9.4 mg/dL (ref 8.4–10.5)
Chloride: 97 mEq/L (ref 96–112)
GFR calc non Af Amer: 23 mL/min — ABNORMAL LOW (ref >90)
GFR, EST AFRICAN AMERICAN: 26 mL/min — AB (ref >90)
Glucose, Bld: 117 mg/dL — ABNORMAL HIGH (ref 70–99)
Potassium: 4.2 mEq/L (ref 3.7–5.3)
Sodium: 140 mEq/L (ref 137–147)

## 2014-10-01 LAB — PROTIME-INR
INR: 2.36 — ABNORMAL HIGH (ref 0.00–1.49)
PROTHROMBIN TIME: 25.8 s — AB (ref 11.6–15.2)

## 2014-10-01 SURGERY — BI-VENTRICULAR PACEMAKER INSERTION (CRT-P)
Anesthesia: LOCAL

## 2014-10-01 MED ORDER — LIDOCAINE HCL (PF) 1 % IJ SOLN
INTRAMUSCULAR | Status: AC
  Filled 2014-10-01: qty 30

## 2014-10-01 MED ORDER — FENTANYL CITRATE 0.05 MG/ML IJ SOLN
INTRAMUSCULAR | Status: DC
Start: 2014-10-01 — End: 2014-10-01
  Filled 2014-10-01: qty 2

## 2014-10-01 MED ORDER — SODIUM CHLORIDE 0.9 % IJ SOLN: 3.0000 mL | INTRAMUSCULAR | Status: DC | PRN

## 2014-10-01 MED ORDER — SODIUM CHLORIDE 0.9 % IJ SOLN
3.0000 mL | Freq: Two times a day (BID) | INTRAMUSCULAR | Status: DC
  Administered 2014-10-02 – 2014-10-05 (×6): 3 mL via INTRAVENOUS

## 2014-10-01 MED ORDER — MIDAZOLAM HCL 5 MG/5ML IJ SOLN
INTRAMUSCULAR | Status: AC
  Filled 2014-10-01: qty 5

## 2014-10-01 MED ORDER — ACETAMINOPHEN 325 MG PO TABS
325.0000 mg | ORAL_TABLET | ORAL | Status: DC | PRN
  Administered 2014-10-01 – 2014-10-03 (×2): 650 mg via ORAL
  Filled 2014-10-01 (×2): qty 2

## 2014-10-01 MED ORDER — METOPROLOL SUCCINATE 12.5 MG HALF TABLET
12.5000 mg | ORAL_TABLET | Freq: Every day | ORAL | Status: DC
  Filled 2014-10-01: qty 1

## 2014-10-01 MED ORDER — VANCOMYCIN HCL IN DEXTROSE 750-5 MG/150ML-% IV SOLN
750.0000 mg | Freq: Once | INTRAVENOUS | Status: DC
  Filled 2014-10-01: qty 150

## 2014-10-01 MED ORDER — VANCOMYCIN HCL IN DEXTROSE 1-5 GM/200ML-% IV SOLN
1000.0000 mg | Freq: Two times a day (BID) | INTRAVENOUS | Status: AC
  Administered 2014-10-02: 1000 mg via INTRAVENOUS
  Filled 2014-10-01: qty 200

## 2014-10-01 MED ORDER — PHENAZOPYRIDINE HCL 200 MG PO TABS: 200.0000 mg | ORAL_TABLET | Freq: Three times a day (TID) | ORAL | Status: DC

## 2014-10-01 MED ORDER — ONDANSETRON HCL 4 MG/2ML IJ SOLN: 4.0000 mg | Freq: Four times a day (QID) | INTRAMUSCULAR | Status: DC | PRN

## 2014-10-01 MED ORDER — WARFARIN - PHYSICIAN DOSING INPATIENT: Freq: Every day | Status: DC

## 2014-10-01 MED ORDER — URELLE 81 MG PO TABS
1.0000 | ORAL_TABLET | Freq: Once | ORAL | Status: AC
  Administered 2014-10-02: 81 mg via ORAL
  Filled 2014-10-01: qty 1

## 2014-10-01 MED ORDER — HEPARIN (PORCINE) IN NACL 2-0.9 UNIT/ML-% IJ SOLN
INTRAMUSCULAR | Status: AC
  Filled 2014-10-01: qty 500

## 2014-10-01 MED ORDER — SODIUM CHLORIDE 0.9 % IV SOLN: 250.0000 mL | INTRAVENOUS | Status: DC | PRN

## 2014-10-01 NOTE — Progress Notes (Signed)
[facility] PROGRESS NOTE Interim History: 80 y.o. male with past medical history of CK stage III, chronic CHF and history of colon cancer status post colectomy with colostomy. Patient came in to the hospital because of shortness of breath. Patient started to have shortness of breath about 5 days ago, worsening about every day, he also reported generalized weakness to the point he couldn't change his colostomy bag. Patient reported significant weight loss since last year. His wife brought him to the hospital for further evaluation.  In the ED he was found to have creatinine of 2.6, baseline creatinine is 3.6 from March of 2015, his weight is 180 patient weighed 230 lbs in July of 2014.   Filed Weights   09/28/14 0457 09/29/14 0505 09/29/14 0900  Weight: 77.474 kg (170 lb 12.8 oz) 75.841 kg (167 lb 3.2 oz) 81 kg (178 lb 9.2 oz)        Intake/Output Summary (Last 24 hours) at 10/01/14 0730 Last data filed at 10/01/14 0700  Gross per 24 hour  Intake    925 ml  Output   1350 ml  Net   -425 ml     Assessment/Plan: Weakness generalized due to a. fib with slow ventricular response: - Most likely due to bradycardia < 30s and 5.0s pauses. - Appreciate cardiology on board. Off betablocker for more than 24hrs. - EP consulted for pacer on 10.6.2015.  Acute on chronic diastolic HF (heart failure)/Acute respiratory failure: - Change to oral lasix. - Strict I and O's. Monitor electrolytes. NO JVD on physical exam, 3+ edema. - Weight on admission 78 kg now 77.4 kg.  CKD (chronic kidney disease) stage 4, GFR 15-29 ml/min - Baseline Cr 2.6-3.0. - cont to hold benazepril.  Essential hypertension - mildly elevated     Code Status: Full code  Family Communication: Plan discussed with the patient in the presence of his wife at bedside.  Disposition Plan: Inpatient, telemetry    Consultants:  cardiology  Procedures: ECHO: estimated ejection fraction was in the range of 40% to  45%. Severe hypokinesis of the basal-midinferolateral, inferior, and inferoseptal myocardium. The study is not technically sufficient to allow evaluation of LV diastolic function.  Antibiotics:  None  HPI/Subjective: Wants to go home. Objective: Filed Vitals:   10/01/14 0500 10/01/14 0600 10/01/14 0626 10/01/14 0700  BP: 146/64  141/18 159/109  Pulse:    73  Temp:    97.4 F (36.3 C)  TempSrc:    Oral  Resp: 15 19  16   Height:      Weight:      SpO2: 100% 100%  100%     Exam:  General: in no acute distress.  HEENT: No bruits, no goiter. -JVD Heart: irregular rate and rhythm. Lungs: Good air movement, clear Abdomen: Soft, nontender,   Data Reviewed: Basic Metabolic Panel:  Recent Labs Lab 09/27/14 1155 09/27/14 1814 09/28/14 0345 09/30/14 0248 10/01/14 0235  NA 143  --  142 139 140  K 3.2*  --  3.2* 3.9 4.2  CL 95*  --  96 95* 97  CO2 35*  --  37* 36* 34*  GLUCOSE 104*  --  80 102* 117*  BUN 22  --  23 28* 35*  CREATININE 2.63*  --  2.65* 2.70* 2.53*  CALCIUM 10.3  --  9.7 9.6 9.4  MG  --  2.1  --   --   --    Liver Function Tests:  Recent Labs Lab 09/28/14 0345  AST 20  ALT 12  ALKPHOS 65  BILITOT 1.1  PROT 6.1  ALBUMIN 3.5   No results found for this basename: LIPASE, AMYLASE,  in the last 168 hours No results found for this basename: AMMONIA,  in the last 168 hours CBC:  Recent Labs Lab 09/27/14 1155 09/28/14 0345 09/30/14 0248 10/01/14 0235  WBC 6.4 6.6 5.4 5.3  HGB 11.9* 10.7* 10.9* 10.7*  HCT 36.0* 32.7* 33.6* 32.2*  MCV 97.6 97.6 99.4 98.8  PLT 102* 86* 77* 76*   Cardiac Enzymes:  Recent Labs Lab 09/27/14 1155  TROPONINI <0.30   BNP (last 3 results)  Recent Labs  09/27/14 1155  PROBNP 7803.0*   CBG: No results found for this basename: GLUCAP,  in the last 168 hours  Recent Results (from the past 240 hour(s))  MRSA PCR SCREENING     Status: None   Collection Time    09/29/14  8:55 AM      Result Value Ref Range  Status   MRSA by PCR NEGATIVE  NEGATIVE Final   Comment:            The GeneXpert MRSA Assay (FDA     approved for NASAL specimens     only), is one component of a     comprehensive MRSA colonization     surveillance program. It is not     intended to diagnose MRSA     infection nor to guide or     monitor treatment for     MRSA infections.     Studies: No results found.  Scheduled Meds: . amoxicillin-clavulanate  500 mg Oral Q12H  . atropine  0.4 mg Intravenous Once  . feeding supplement (ENSURE COMPLETE)  237 mL Oral Q24H  . feeding supplement (ENSURE)  1 Container Oral Q24H  . finasteride  5 mg Oral Daily  . furosemide  40 mg Oral Daily  . gentamicin irrigation  80 mg Irrigation On Call  . pantoprazole  40 mg Oral Daily  . sodium chloride  3 mL Intravenous Q12H  . sodium chloride  3 mL Intravenous Q12H  . warfarin  3 mg Oral ONCE-1800  . Warfarin - Pharmacist Dosing Inpatient   Does not apply q1800   Continuous Infusions: . sodium chloride 50 mL/hr at 09/30/14 1256  . sodium chloride    . sodium chloride 50 mL/hr at 10/01/14 0400     Charlynne Cousins  [facility] Pager [phone]  If 8PM-8AM, please contact night-coverage at [email or web address], password [email or web address] 10/01/2014, 7:30 AM  LOS: 4 days     **Disclaimer: This note may have been dictated with voice recognition software. Similar sounding words can inadvertently be transcribed and this note may contain transcription errors which may not have been corrected upon publication of note.**

## 2014-10-01 NOTE — Progress Notes (Signed)
[place] for Coumadin Indication: h/o atrial fibrillation  Allergies  Allergen Reactions  . Cephalexin Other (See Comments)    "too strong for my system" - per office note 05/05/2012  . Ciprofloxacin Other (See Comments)    Reaction unknown  . Hctz [Hydrochlorothiazide]     DIZZINESS   . Levofloxacin Other (See Comments)    "too strong for my system" - per office note 05/05/2012  . Lipitor [Atorvastatin Calcium]     MUSCLE PAIN  . Niaspan [Niacin Er]     FLUSHING   . Oxycodone-Acetaminophen Other (See Comments)    Reaction unknown  . Sulfonamide Derivatives Other (See Comments)    Reaction unkonwn    Labs:  Recent Labs  09/29/14 0336 09/30/14 0248 10/01/14 0235  HGB  --  10.9* 10.7*  HCT  --  33.6* 32.2*  PLT  --  77* 76*  LABPROT 37.5* 28.5* 25.8*  INR 3.81* 2.68* 2.36*  CREATININE  --  2.70* 2.53*    Estimated Creatinine Clearance: 24.8 ml/min (by C-G formula based on Cr of 2.53).   Assessment: 80 yo M admitted 09/27/2014  With 5 days of progressive SOB and weakness, unable to change his own colostomy bag.  Pharmacy consulted to continue warfarin for afib.    PMH of CK stage III, chronic CHF and history of colon cancer status post colectomy with colostomy . CKD stage IV one kidney. baseline SCr = 2.63  10/6 events: for pacer today  Coag:  Afib, INR now 2.36 after 4d hold, h/h stable, MD dcd warfarin per pharmacy protocol, will sign off, follow up plan s/p pacer. PTA dose of coumadin:   3 mg daily except 4.5 mg qWed.- last dose taken on 09/26/14. on admission his INR is 3.6, SUPRAtherapeutic.  No bleeding reported. Last anticoag office visit note on 9/17 indicates INR was 3.2. At that time the patient was instructed to skip that day's dose then resume usual regimen of 3mg  daily except 4.5 mg qWed.  On 08/15/14 anticoag visit, the INR was 2.9 and on 07/25/14 the INR was 2.8 on this same dosage regimen.     Goal of Therapy:  INR  2-3 Monitor platelets by anticoagulation protocol: Yes   Plan:  1. Pharmacy consult for warfarin DCd, pharmacy will sign off.  Thank you for allowing pharmacy to be a part of this patients care team.  Rowe Robert Pharm.D., BCPS, AQ-Cardiology Clinical Pharmacist 10/01/2014 8:43 AM Pager: [phone] Phone: [phone]

## 2014-10-01 NOTE — Discharge Instructions (Addendum)
Supplemental Discharge Instructions for  Pacemaker Patients  Activity No heavy lifting or vigorous activity with your left/right arm for 6 to 8 weeks.  Do not raise your left/right arm above your head for one week.  Gradually raise your affected arm as drawn below.          10/04/14                             10/05/14                   10/06/14                 10/07/14  NO DRIVING until cleared by your doctor .  WOUND CARE   Keep the wound area clean and dry.  Do not get this area wet for one week. No showers for one week; you may shower on  10/09/14   .   The tape/steri-strips on your wound will fall off; do not pull them off.  No bandage is needed on the site.  DO  NOT apply any creams, oils, or ointments to the wound area.   If you notice any drainage or discharge from the wound, any swelling or bruising at the site, or you develop a fever > 101? F after you are discharged home, call the office at once.  Special Instructions   You are still able to use cellular telephones; use the ear opposite the side where you have your pacemaker/defibrillator.  Avoid carrying your cellular phone near your device.   When traveling through airports, show security personnel your identification card to avoid being screened in the metal detectors.  Ask the security personnel to use the hand wand.   Avoid arc welding equipment, MRI testing (magnetic resonance imaging), TENS units (transcutaneous nerve stimulators).  Call the office for questions about other devices.   Avoid electrical appliances that are in poor condition or are not properly grounded.   Microwave ovens are safe to be near or to operate.  Information on my medicine - Coumadin   (Warfarin)  This medication education was reviewed with me or my healthcare representative as part of my discharge preparation.  The pharmacist that spoke with me during my hospital stay was:  Arman Bogus, [facility]  Why was Coumadin prescribed for  you? Coumadin was prescribed for you because you have a blood clot or a medical condition that can cause an increased risk of forming blood clots. Blood clots can cause serious health problems by blocking the flow of blood to the heart, lung, or brain. Coumadin can prevent harmful blood clots from forming. As a reminder your indication for Coumadin is:   Stroke Prevention Because Of Atrial Fibrillation  What test will check on my response to Coumadin? While on Coumadin (warfarin) you will need to have an INR test regularly to ensure that your dose is keeping you in the desired range. The INR (international normalized ratio) number is calculated from the result of the laboratory test called prothrombin time (PT).  If an INR APPOINTMENT HAS NOT ALREADY BEEN MADE FOR YOU please schedule an appointment to have this lab work done by your health care provider within 7 days. Your INR goal is usually a number between:  2 to 3 or your provider may give you a more narrow range like 2-2.5.  Ask your health care provider during an office visit what your goal INR  is.  What  do you need to  know  About  COUMADIN? Take Coumadin (warfarin) exactly as prescribed by your healthcare provider about the same time each day.  DO NOT stop taking without talking to the doctor who prescribed the medication.  Stopping without other blood clot prevention medication to take the place of Coumadin may increase your risk of developing a new clot or stroke.  Get refills before you run out.  What do you do if you miss a dose? If you miss a dose, take it as soon as you remember on the same day then continue your regularly scheduled regimen the next day.  Do not take two doses of Coumadin at the same time.  Important Safety Information A possible side effect of Coumadin (Warfarin) is an increased risk of bleeding. You should call your healthcare provider right away if you experience any of the following:   Bleeding from an injury or  your nose that does not stop.   Unusual colored urine (red or dark brown) or unusual colored stools (red or black).   Unusual bruising for unknown reasons.   A serious fall or if you hit your head (even if there is no bleeding).  Some foods or medicines interact with Coumadin (warfarin) and might alter your response to warfarin. To help avoid this:   Eat a balanced diet, maintaining a consistent amount of Vitamin K.   Notify your provider about major diet changes you plan to make.   Avoid alcohol or limit your intake to 1 drink for women and 2 drinks for men per day. (1 drink is 5 oz. wine, 12 oz. beer, or 1.5 oz. liquor.)  Make sure that ANY health care provider who prescribes medication for you knows that you are taking Coumadin (warfarin).  Also make sure the healthcare provider who is monitoring your Coumadin knows when you have started a new medication including herbals and non-prescription products.  Coumadin (Warfarin)  Major Drug Interactions  Increased Warfarin Effect Decreased Warfarin Effect  Alcohol (large quantities) Antibiotics (esp. Septra/Bactrim, Flagyl, Cipro) Amiodarone (Cordarone) Aspirin (ASA) Cimetidine (Tagamet) Megestrol (Megace) NSAIDs (ibuprofen, naproxen, etc.) Piroxicam (Feldene) Propafenone (Rythmol SR) Propranolol (Inderal) Isoniazid (INH) Posaconazole (Noxafil) Barbiturates (Phenobarbital) Carbamazepine (Tegretol) Chlordiazepoxide (Librium) Cholestyramine (Questran) Griseofulvin Oral Contraceptives Rifampin Sucralfate (Carafate) Vitamin K   Coumadin (Warfarin) Major Herbal Interactions  Increased Warfarin Effect Decreased Warfarin Effect  Garlic Ginseng Ginkgo biloba Coenzyme Q10 Green tea St. Johns wort    Coumadin (Warfarin) FOOD Interactions  Eat a consistent number of servings per week of foods HIGH in Vitamin K (1 serving =  cup)  Collards (cooked, or boiled & drained) Kale (cooked, or boiled & drained) Mustard greens (cooked,  or boiled & drained) Parsley *serving size only =  cup Spinach (cooked, or boiled & drained) Swiss chard (cooked, or boiled & drained) Turnip greens (cooked, or boiled & drained)  Eat a consistent number of servings per week of foods MEDIUM-HIGH in Vitamin K (1 serving = 1 cup)  Asparagus (cooked, or boiled & drained) Broccoli (cooked, boiled & drained, or raw & chopped) Brussel sprouts (cooked, or boiled & drained) *serving size only =  cup Lettuce, raw (green leaf, endive, romaine) Spinach, raw Turnip greens, raw & chopped   These websites have more information on Coumadin (warfarin):  FailFactory.se; VeganReport.com.au;

## 2014-10-01 NOTE — Progress Notes (Signed)
SUBJECTIVE: The patient states he feels well today.  No chest pain, shortness of breath stable.  Some decubitus soreness at site of pressure ulcer. No dizziness at rest.  CURRENT MEDICATIONS: . amoxicillin-clavulanate  500 mg Oral Q12H  . atropine  0.4 mg Intravenous Once  . feeding supplement (ENSURE COMPLETE)  237 mL Oral Q24H  . feeding supplement (ENSURE)  1 Container Oral Q24H  . finasteride  5 mg Oral Daily  . furosemide  40 mg Oral Daily  . gentamicin irrigation  80 mg Irrigation On Call  . pantoprazole  40 mg Oral Daily  . sodium chloride  3 mL Intravenous Q12H  . sodium chloride  3 mL Intravenous Q12H  . warfarin  3 mg Oral ONCE-1800  . Warfarin - Pharmacist Dosing Inpatient   Does not apply q1800   . sodium chloride 50 mL/hr at 09/30/14 1256  . sodium chloride    . sodium chloride 50 mL/hr at 10/01/14 0400    OBJECTIVE: Physical Exam: Filed Vitals:   10/01/14 0500 10/01/14 0600 10/01/14 0626 10/01/14 0700  BP: 146/64  141/18 159/109  Pulse:    73  Temp:    97.4 F (36.3 C)  TempSrc:    Oral  Resp: 15 19  16   Height:      Weight:      SpO2: 100% 100%  100%    Intake/Output Summary (Last 24 hours) at 10/01/14 0725 Last data filed at 10/01/14 0700  Gross per 24 hour  Intake    925 ml  Output   1350 ml  Net   -425 ml    Telemetry reveals junctional escape 30's with frequent atrial ectopy  GEN- The patient is chronically ill and frail appearing, alert and oriented x 3 today.   Head- normocephalic, atraumatic Eyes-  Sclera clear, conjunctiva pink Ears- hearing intact Oropharynx- clear Neck- supple,  Lungs- Clear to ausculation bilaterally, normal work of breathing Heart- bradycardic irregular rhythm GI- soft, NT, ND, + BS Extremities- no clubbing, cyanosis, + dependant edema MS- diffuse muscle atrophy  LABS: Basic Metabolic Panel:  Recent Labs  09/30/14 0248 10/01/14 0235  NA 139 140  K 3.9 4.2  CL 95* 97  CO2 36* 34*  GLUCOSE 102* 117*    BUN 28* 35*  CREATININE 2.70* 2.53*  CALCIUM 9.6 9.4   CBC:  Recent Labs  09/30/14 0248 10/01/14 0235  WBC 5.4 5.3  HGB 10.9* 10.7*  HCT 33.6* 32.2*  MCV 99.4 98.8  PLT 77* 76*    RADIOLOGY: Ct Abdomen Pelvis Wo Contrast 09/27/2014   CLINICAL DATA:  Weight loss. History of rectal and colon cancer post colostomy.  EXAM: CT ABDOMEN AND PELVIS WITHOUT CONTRAST  TECHNIQUE: Multidetector CT imaging of the abdomen and pelvis was performed following the standard protocol without IV contrast.  COMPARISON:  11/20/2013  FINDINGS: Technically limited study due to motion artifact and streak artifact.  Bilateral pleural effusions, greater on the right. Atelectasis in the lung bases. Postoperative changes in the mediastinum. Calcification of the native Coronary arteries.  The unenhanced appearance of the liver, spleen, gallbladder, pancreas, adrenal glands, inferior vena cava, and retroperitoneal lymph nodes is unremarkable. Extensive calcification throughout the abdominal aorta without aneurysm. Bilateral renal atrophy, greater on the right. Low-attenuation mass lesions are arising from the upper pole of the left kidney likely representing cysts. No significant change in size since previous study. No hydronephrosis or hydroureter. Stomach, small bowel, and colon are not abnormally distended. Sigmoid colonic  resection with left lower quadrant colostomy. There is of peristomal hernia containing small bowel and colon. There is an additional ventral anterior abdominal wall hernia containing colon. Contrast material passes through this area suggesting no evidence of obstruction. No free air or free fluid in the abdomen.  Pelvis: Postoperative changes in the pelvis with rectal resection. Soft tissue thickening and infiltration in the presacral space appears unchanged since previous study, likely postoperative. Small amount of free fluid in the pelvis is nonspecific. Prostate gland is enlarged. Bladder wall is not  thickened. Spondylolysis with mild spondylolisthesis at L5-S1. Diffuse degenerative changes throughout the cervical spine. No destructive bone lesions appreciated.  IMPRESSION: Surgical resection of the rectosigmoid colon with left lower quadrant colostomy. Peristomal and anterior abdominal wall hernias containing colon and small bowel without evidence of obstruction. Small amount of free fluid in the pelvis is nonspecific. Bilateral renal atrophy with probable left renal cysts. Bilateral pleural effusions with basilar atelectasis.   Electronically Signed   By: Lucienne Capers M.D.   On: 09/27/2014 23:36   Dg Chest 2 View 09/25/2014   CLINICAL DATA:  Difficulty breathing  EXAM: CHEST  2 VIEW  COMPARISON:  November 20, 2013  FINDINGS: There is underlying emphysema. There is a rather minimal right pleural effusion. Elsewhere lungs are clear. Heart is mildly enlarged with pulmonary vascularity within normal limits. No adenopathy. Patient is status post coronary artery bypass grafting. Patient is status post left total shoulder replacement.  IMPRESSION: Rather minimal right pleural effusion. Lungs otherwise clear. Underlying emphysema. Mild cardiac enlargement, stable.   Electronically Signed   By: Lowella Grip M.D.   On: 09/25/2014 13:48   Dg Chest Port 1 View 09/27/2014   CLINICAL DATA:  Weakness for several weeks. Progressive dyspnea. Progressive shortness of breath.  EXAM: PORTABLE CHEST - 1 VIEW  COMPARISON:  09/25/2014.  FINDINGS: Lower lung volumes with cardiomegaly. CABG. Monitoring leads project over the chest. Small RIGHT pleural effusion. Pulmonary vascular congestion. LEFT shoulder hemiarthroplasty it RIGHT shoulder osteoarthritis.  IMPRESSION: Mild CHF with small RIGHT pleural effusion.   Electronically Signed   By: Dereck Ligas M.D.   On: 09/27/2014 12:15    ASSESSMENT AND PLAN:  Principal Problem:   Acute on chronic diastolic HF (heart failure) Active Problems:   Essential hypertension    Atrial fibrillation with slow ventricular response   Weakness generalized   CKD (chronic kidney disease) stage 4, GFR 15-29 ml/min  1. Atrial fibrillation with AV block and markedly prolonged RR intervals The patient has symptomatic bradycardia.  There are no reversible causes.  I would therefore recommend pacemaker implantation at this time.  Given reduced EF, biventricular pacing may be reasonable.  Given his fragility, the anticipated benefit of LV lead placement is probably marginal at best.  Risks, benefits, alternatives to pacemaker implantation were discussed in detail with the patient today. The patient understands that the risks include but are not limited to bleeding, infection, pneumothorax, perforation, tamponade, vascular damage, renal failure, MI, stroke, death,  and lead dislodgement and wishes to proceed.  He is aware that given his fragility that his risks of infection are high.  We will plan to proceed with the procedure this afternoon.  Discussed allergies with pharmacy - recommend single dose of Vancomycin 750mg  that will cover for 24 hours.  Will give preoperatively.

## 2014-10-01 NOTE — H&P (View-Only) (Signed)
SUBJECTIVE: The patient states he feels well today.  No chest pain, shortness of breath stable.  Some decubitus soreness at site of pressure ulcer. No dizziness at rest.  CURRENT MEDICATIONS: . amoxicillin-clavulanate  500 mg Oral Q12H  . atropine  0.4 mg Intravenous Once  . feeding supplement (ENSURE COMPLETE)  237 mL Oral Q24H  . feeding supplement (ENSURE)  1 Container Oral Q24H  . finasteride  5 mg Oral Daily  . furosemide  40 mg Oral Daily  . gentamicin irrigation  80 mg Irrigation On Call  . pantoprazole  40 mg Oral Daily  . sodium chloride  3 mL Intravenous Q12H  . sodium chloride  3 mL Intravenous Q12H  . warfarin  3 mg Oral ONCE-1800  . Warfarin - Pharmacist Dosing Inpatient   Does not apply q1800   . sodium chloride 50 mL/hr at 09/30/14 1256  . sodium chloride    . sodium chloride 50 mL/hr at 10/01/14 0400    OBJECTIVE: Physical Exam: Filed Vitals:   10/01/14 0500 10/01/14 0600 10/01/14 0626 10/01/14 0700  BP: 146/64  141/18 159/109  Pulse:    73  Temp:    97.4 F (36.3 C)  TempSrc:    Oral  Resp: 15 19  16   Height:      Weight:      SpO2: 100% 100%  100%    Intake/Output Summary (Last 24 hours) at 10/01/14 0725 Last data filed at 10/01/14 0700  Gross per 24 hour  Intake    925 ml  Output   1350 ml  Net   -425 ml    Telemetry reveals junctional escape 30's with frequent atrial ectopy  GEN- The patient is chronically ill and frail appearing, alert and oriented x 3 today.   Head- normocephalic, atraumatic Eyes-  Sclera clear, conjunctiva pink Ears- hearing intact Oropharynx- clear Neck- supple,  Lungs- Clear to ausculation bilaterally, normal work of breathing Heart- bradycardic irregular rhythm GI- soft, NT, ND, + BS Extremities- no clubbing, cyanosis, + dependant edema MS- diffuse muscle atrophy  LABS: Basic Metabolic Panel:  Recent Labs  09/30/14 0248 10/01/14 0235  NA 139 140  K 3.9 4.2  CL 95* 97  CO2 36* 34*  GLUCOSE 102* 117*    BUN 28* 35*  CREATININE 2.70* 2.53*  CALCIUM 9.6 9.4   CBC:  Recent Labs  09/30/14 0248 10/01/14 0235  WBC 5.4 5.3  HGB 10.9* 10.7*  HCT 33.6* 32.2*  MCV 99.4 98.8  PLT 77* 76*    RADIOLOGY: Ct Abdomen Pelvis Wo Contrast 09/27/2014   CLINICAL DATA:  Weight loss. History of rectal and colon cancer post colostomy.  EXAM: CT ABDOMEN AND PELVIS WITHOUT CONTRAST  TECHNIQUE: Multidetector CT imaging of the abdomen and pelvis was performed following the standard protocol without IV contrast.  COMPARISON:  11/20/2013  FINDINGS: Technically limited study due to motion artifact and streak artifact.  Bilateral pleural effusions, greater on the right. Atelectasis in the lung bases. Postoperative changes in the mediastinum. Calcification of the native Coronary arteries.  The unenhanced appearance of the liver, spleen, gallbladder, pancreas, adrenal glands, inferior vena cava, and retroperitoneal lymph nodes is unremarkable. Extensive calcification throughout the abdominal aorta without aneurysm. Bilateral renal atrophy, greater on the right. Low-attenuation mass lesions are arising from the upper pole of the left kidney likely representing cysts. No significant change in size since previous study. No hydronephrosis or hydroureter. Stomach, small bowel, and colon are not abnormally distended. Sigmoid colonic  resection with left lower quadrant colostomy. There is of peristomal hernia containing small bowel and colon. There is an additional ventral anterior abdominal wall hernia containing colon. Contrast material passes through this area suggesting no evidence of obstruction. No free air or free fluid in the abdomen.  Pelvis: Postoperative changes in the pelvis with rectal resection. Soft tissue thickening and infiltration in the presacral space appears unchanged since previous study, likely postoperative. Small amount of free fluid in the pelvis is nonspecific. Prostate gland is enlarged. Bladder wall is not  thickened. Spondylolysis with mild spondylolisthesis at L5-S1. Diffuse degenerative changes throughout the cervical spine. No destructive bone lesions appreciated.  IMPRESSION: Surgical resection of the rectosigmoid colon with left lower quadrant colostomy. Peristomal and anterior abdominal wall hernias containing colon and small bowel without evidence of obstruction. Small amount of free fluid in the pelvis is nonspecific. Bilateral renal atrophy with probable left renal cysts. Bilateral pleural effusions with basilar atelectasis.   Electronically Signed   By: Lucienne Capers M.D.   On: 09/27/2014 23:36   Dg Chest 2 View 09/25/2014   CLINICAL DATA:  Difficulty breathing  EXAM: CHEST  2 VIEW  COMPARISON:  November 20, 2013  FINDINGS: There is underlying emphysema. There is a rather minimal right pleural effusion. Elsewhere lungs are clear. Heart is mildly enlarged with pulmonary vascularity within normal limits. No adenopathy. Patient is status post coronary artery bypass grafting. Patient is status post left total shoulder replacement.  IMPRESSION: Rather minimal right pleural effusion. Lungs otherwise clear. Underlying emphysema. Mild cardiac enlargement, stable.   Electronically Signed   By: Lowella Grip M.D.   On: 09/25/2014 13:48   Dg Chest Port 1 View 09/27/2014   CLINICAL DATA:  Weakness for several weeks. Progressive dyspnea. Progressive shortness of breath.  EXAM: PORTABLE CHEST - 1 VIEW  COMPARISON:  09/25/2014.  FINDINGS: Lower lung volumes with cardiomegaly. CABG. Monitoring leads project over the chest. Small RIGHT pleural effusion. Pulmonary vascular congestion. LEFT shoulder hemiarthroplasty it RIGHT shoulder osteoarthritis.  IMPRESSION: Mild CHF with small RIGHT pleural effusion.   Electronically Signed   By: Dereck Ligas M.D.   On: 09/27/2014 12:15    ASSESSMENT AND PLAN:  Principal Problem:   Acute on chronic diastolic HF (heart failure) Active Problems:   Essential hypertension    Atrial fibrillation with slow ventricular response   Weakness generalized   CKD (chronic kidney disease) stage 4, GFR 15-29 ml/min  1. Atrial fibrillation with AV block and markedly prolonged RR intervals The patient has symptomatic bradycardia.  There are no reversible causes.  I would therefore recommend pacemaker implantation at this time.  Given reduced EF, biventricular pacing may be reasonable.  Given his fragility, the anticipated benefit of LV lead placement is probably marginal at best.  Risks, benefits, alternatives to pacemaker implantation were discussed in detail with the patient today. The patient understands that the risks include but are not limited to bleeding, infection, pneumothorax, perforation, tamponade, vascular damage, renal failure, MI, stroke, death,  and lead dislodgement and wishes to proceed.  He is aware that given his fragility that his risks of infection are high.  We will plan to proceed with the procedure this afternoon.  Discussed allergies with pharmacy - recommend single dose of Vancomycin 750mg  that will cover for 24 hours.  Will give preoperatively.

## 2014-10-01 NOTE — Progress Notes (Signed)
Patient with continual c/o pressure/pain to lower abdomen with little urine output.  Tylenol given x1. NP on call made aware of complaint.  Awaiting new orders. RN will continue to monitor. Shellee Milo, RN

## 2014-10-01 NOTE — Progress Notes (Signed)
PT Cancellation Note  Patient Details Name: Nicholas [name] MRN: [id number] DOB: 02/01/1934   Cancelled Treatment:    Reason Eval/Treat Not Completed: Medical issues which prohibited therapy. Discussed pt case with RN who states pt is to go for pacemaker hopefully this afternoon, and that he continues to have pauses with drop in HR. Will hold PT treatment until after procedure.  Rolinda Roan 10/01/2014, 11:22 AM  Rolinda Roan, PT, DPT Acute Rehabilitation Services Pager: [phone]

## 2014-10-01 NOTE — Interval H&P Note (Signed)
History and Physical Interval Note:  10/01/2014 2:35 PM  Warden Fillers  has presented today for surgery, with the diagnosis of tachy-brady syndrome  The various methods of treatment have been discussed with the patient and family. After consideration of risks, benefits and other options for treatment, the patient has consented to  Procedure(s): BI-VENTRICULAR PACEMAKER INSERTION (CRT-P) (N/A) as a surgical intervention .  The patient's history has been reviewed, patient examined, no change in status, stable for surgery.  I have reviewed the patient's chart and labs.  Questions were answered to the patient's satisfaction.     Thompson Grayer

## 2014-10-01 NOTE — Op Note (Signed)
SURGEON:  Thompson Grayer, MD     PREPROCEDURE DIAGNOSIS:  Symptomatic complete heart block, permanent atrial fibrillation, chronic systolic dysfunction    POSTPROCEDURE DIAGNOSIS:   Symptomatic complete heart block, permanent atrial fibrillation, chronic systolic dysfunction     PROCEDURES:   1. Left upper extremity venography.   2. Biventricular Pacemaker implantation.     INTRODUCTION:  Nicholas [name] is a 80 y.o. male with a history of symptomatic complete heart block, permanent atrial fibrillation, and chronic systolic dysfunction who presents today for BiVentricular pacemaker implantation.  The patient reports progressive symptoms of fatigue, dizziness, and SOB.  He presents with permanent afib and very long RR intervals with frequent pauses of over 5 seconds.  No reversible causes have been identified.  The patient therefore presents today for pacemaker implantation.     DESCRIPTION OF PROCEDURE:  Informed written consent was obtained, and  the patient was brought to the electrophysiology lab in a fasting state.  The patient required no sedation for the procedure today.  The patients left chest was prepped and draped in the usual sterile fashion by the EP lab staff. The skin overlying the left deltopectoral region was infiltrated with lidocaine for local analgesia.  A 4-cm incision was made over the left deltopectoral region.  A left subcutaneous pacemaker pocket was fashioned using a combination of sharp and blunt dissection. Electrocautery was required to assure hemostasis.    Left Upper Extremity Venography: A venogram of the left upper extremity was performed, which revealed a tiny left cephalic vein, which emptied into a moderate size left subclavian vein.  The left axillary vein was small in size.    RA/RV Lead Placement: The left axillary vein was therefore cannulated.  Through the left axillary vein, a Medtronic model [id number] (serial number [id number]) right ventricular lead was  advanced with fluoroscopic visualization into the right ventricular apex position.  Initial right ventricular lead R-waves measured 6.2 mV with an impedance of 667 ohms and a threshold of 0.9 V at 0.5 msec.   LV Lead Placement: A Medtronic MB-2 guide was advanced through the left axillary vein into the low lateral right atrium.  A Bard curved Damato catheter was introduced through the MB-2 guide and used to cannulate the coronary sinus.  Coronary sinus cannulation was confirmed with a puff of contrast.  A coronary sinus selective venography balloon was advanced through the MB- 2 guide and advanced into the proximal portion of the coronary sinus.  A selective coronary sinus venogram was performed by hand injection of nonionic contrast.  This demonstrated a large CS body.  Branches of the CS unfortunately could not be well visualized.     A Whisper CSJ wire was introduced through the guide and advanced into a lateral branch.  A [place] model [phone] [id number]  (serial number [id number]) lead was advanced through the MB-2 into the lateral branch.   This was  approximately one-thirds from the base to the apex in a very lateral position.  In this location, the left ventricular lead R-waves measured  5.4 mV with impedance of 735 ohms and a threshold of 0.9 volt at 0.5  milliseconds in the D1-M2 bipolar configuration with no diaphragmatic  stimulation observed when pacing at 10 volts output.  The MB-2 guide was  therefore removed.   Both leads were secured to the pectoralis fascia using #2-0 silk over the suture sleeves.   Device Placement:  The leads were then connected to a [employer]  Quadra RF  model [id number] (serial number  [id number]) pacemaker.  The pocket was irrigated with copious gentamicin solution.  The pacemaker was then placed into the pocket.  The pocket was then closed in 2 layers with 2.0 Vicryl suture for the subcutaneous and subcuticular layers.  Steri-  Strips and a sterile  dressing were then applied. EBL<47ml.  There were no early apparent complications.     CONCLUSIONS:   1. Successful implantation of a [employer] Quadra RF  biventricular pacemaker for symptomatic complete heart block, permanent atrial fibrillation, and chronic systolic dysfunction.  2. No early apparent complications.           Thompson Grayer, MD 10/01/2014 4:42 PM

## 2014-10-01 NOTE — Progress Notes (Signed)
Patient c/o pressure to bladder with minimal output.  Bladder scan obtained.  135 ml documented. RN will continue to assess. Shellee Milo, RN

## 2014-10-02 ENCOUNTER — Inpatient Hospital Stay (HOSPITAL_COMMUNITY): Payer: Medicare Other

## 2014-10-02 DIAGNOSIS — N400 Enlarged prostate without lower urinary tract symptoms: Secondary | ICD-10-CM

## 2014-10-02 DIAGNOSIS — I472 Ventricular tachycardia: Secondary | ICD-10-CM

## 2014-10-02 DIAGNOSIS — I442 Atrioventricular block, complete: Secondary | ICD-10-CM

## 2014-10-02 LAB — BASIC METABOLIC PANEL
Anion gap: 11 (ref 5–15)
BUN: 40 mg/dL — AB (ref 6–23)
CO2: 30 meq/L (ref 19–32)
Calcium: 9.9 mg/dL (ref 8.4–10.5)
Chloride: 97 mEq/L (ref 96–112)
Creatinine, Ser: 2.54 mg/dL — ABNORMAL HIGH (ref 0.50–1.35)
GFR calc Af Amer: 26 mL/min — ABNORMAL LOW (ref >90)
GFR calc non Af Amer: 23 mL/min — ABNORMAL LOW (ref >90)
GLUCOSE: 125 mg/dL — AB (ref 70–99)
Potassium: 4.7 mEq/L (ref 3.7–5.3)
Sodium: 138 mEq/L (ref 137–147)

## 2014-10-02 LAB — CBC
HEMATOCRIT: 33.3 % — AB (ref 39.0–52.0)
Hemoglobin: 11.1 g/dL — ABNORMAL LOW (ref 13.0–17.0)
MCH: 32.2 pg (ref 26.0–34.0)
MCHC: 33.3 g/dL (ref 30.0–36.0)
MCV: 96.5 fL (ref 78.0–100.0)
Platelets: 91 10*3/uL — ABNORMAL LOW (ref 150–400)
RBC: 3.45 MIL/uL — AB (ref 4.22–5.81)
RDW: 13.8 % (ref 11.5–15.5)
WBC: 9.8 10*3/uL (ref 4.0–10.5)

## 2014-10-02 LAB — PROTIME-INR
INR: 1.78 — AB (ref 0.00–1.49)
PROTHROMBIN TIME: 20.7 s — AB (ref 11.6–15.2)

## 2014-10-02 MED ORDER — FINASTERIDE 5 MG PO TABS
2.5000 mg | ORAL_TABLET | Freq: Every day | ORAL | Status: DC
  Administered 2014-10-02 – 2014-10-03 (×2): 2.5 mg via ORAL
  Filled 2014-10-02 (×2): qty 0.5

## 2014-10-02 MED ORDER — TAMSULOSIN HCL 0.4 MG PO CAPS
0.4000 mg | ORAL_CAPSULE | Freq: Every day | ORAL | Status: DC
  Administered 2014-10-02 – 2014-10-04 (×3): 0.4 mg via ORAL
  Filled 2014-10-02 (×5): qty 1

## 2014-10-02 MED ORDER — HYDRALAZINE HCL 20 MG/ML IJ SOLN
10.0000 mg | Freq: Once | INTRAMUSCULAR | Status: AC
  Administered 2014-10-02: 01:00:00 10 mg via INTRAVENOUS
  Filled 2014-10-02: qty 1

## 2014-10-02 MED ORDER — FUROSEMIDE 10 MG/ML IJ SOLN
40.0000 mg | Freq: Two times a day (BID) | INTRAMUSCULAR | Status: DC
  Administered 2014-10-02 – 2014-10-03 (×3): 40 mg via INTRAVENOUS
  Filled 2014-10-02 (×5): qty 4

## 2014-10-02 MED ORDER — HYDROCODONE-ACETAMINOPHEN 5-325 MG PO TABS: 2.0000 | ORAL_TABLET | Freq: Once | ORAL | Status: DC

## 2014-10-02 MED ORDER — HYDRALAZINE HCL 20 MG/ML IJ SOLN
10.0000 mg | Freq: Once | INTRAMUSCULAR | Status: AC
  Administered 2014-10-02: 04:00:00 10 mg via INTRAVENOUS
  Filled 2014-10-02: qty 1

## 2014-10-02 MED ORDER — ISOSORB DINITRATE-HYDRALAZINE 20-37.5 MG PO TABS
1.0000 | ORAL_TABLET | Freq: Two times a day (BID) | ORAL | Status: DC
  Administered 2014-10-02 – 2014-10-05 (×7): 1 via ORAL
  Filled 2014-10-02 (×10): qty 1

## 2014-10-02 MED ORDER — METOPROLOL SUCCINATE ER 50 MG PO TB24
50.0000 mg | ORAL_TABLET | Freq: Every day | ORAL | Status: DC
  Administered 2014-10-02 – 2014-10-03 (×2): 50 mg via ORAL
  Filled 2014-10-02 (×3): qty 1

## 2014-10-02 MED ORDER — MORPHINE SULFATE 4 MG/ML IJ SOLN
4.0000 mg | Freq: Once | INTRAMUSCULAR | Status: AC
  Administered 2014-10-02: 04:00:00 4 mg via INTRAVENOUS
  Filled 2014-10-02: qty 1

## 2014-10-02 MED ORDER — LORAZEPAM 1 MG PO TABS
1.0000 mg | ORAL_TABLET | Freq: Once | ORAL | Status: AC
  Administered 2014-10-02: 01:00:00 1 mg via ORAL
  Filled 2014-10-02: qty 1

## 2014-10-02 MED ORDER — YOU HAVE A PACEMAKER BOOK
Freq: Once | Status: AC
  Administered 2014-10-02: 13:00:00
  Filled 2014-10-02: qty 1

## 2014-10-02 MED ORDER — WARFARIN - PHARMACIST DOSING INPATIENT: Freq: Every day | Status: DC

## 2014-10-02 MED ORDER — WARFARIN SODIUM 4 MG PO TABS
4.0000 mg | ORAL_TABLET | Freq: Once | ORAL | Status: AC
  Administered 2014-10-02: 19:00:00 4 mg via ORAL
  Filled 2014-10-02: qty 1

## 2014-10-02 NOTE — Progress Notes (Signed)
Physical Therapy Treatment Patient Details Name: NYZAIAH KAI MRN: [id number] DOB: 10-06-1934 Today's Date: 10/02/2014    History of Present Illness Pt is a 80 y.o. male with past medical history of CK stage III, chronic CHF and history of colon cancer status post colectomy with colostomy. Patient came in to the hospital because of SOB. Patient started to have SOB about 5 days ago, worsening about every day, he also reported generalized weakness to the point he couldn't change his colostomy bag. Patient reported significant weight loss since last year. His wife brought him to the hospital for further evaluation. SP implantation of [employer] Allure Quadra RFBiV PPM for complete HB    PT Comments    Making progress with mobility, walking, and activity tolerance, though noted desats on Room Air; Will post O2 sat qualifying note -- hopefull pt will progress to not need supplemental O2  Follow Up Recommendations  Home health PT;Supervision for mobility/OOB     Equipment Recommendations  Other (comment) (Has RW; worth considering Rollator)    Recommendations for Other Services       Precautions / Restrictions Precautions Precautions: Fall Precaution Comments: Fall risk greatly reduced with use of RW    Mobility  Bed Mobility Overal bed mobility: Needs Assistance Bed Mobility: Supine to Sit     Supine to sit: Supervision     General bed mobility comments: Pt requires increased time and use of bed rails for assist  Transfers Overall transfer level: Needs assistance Equipment used: Rolling walker (2 wheeled) Transfers: Sit to/from Stand Sit to Stand: Min guard         General transfer comment: Pt was cued for hand placement on seated surface for safety.   Ambulation/Gait Ambulation/Gait assistance: Min guard Ambulation Distance (Feet): 200 Feet Assistive device: Rolling walker (2 wheeled) Gait Pattern/deviations: Step-through pattern Gait velocity: Decreased    General Gait Details: Initiated amb on Room Air and pt desatted again; restarted supplemental O2   Stairs            Wheelchair Mobility    Modified Rankin (Stroke Patients Only)       Balance                                    Cognition Arousal/Alertness: Awake/alert Behavior During Therapy: WFL for tasks assessed/performed Overall Cognitive Status: Within Functional Limits for tasks assessed                      Exercises      General Comments        Pertinent Vitals/Pain Pain Assessment: No/denies pain    Home Living                      Prior Function            PT Goals (current goals can now be found in the care plan section) Acute Rehab PT Goals Patient Stated Goal: To return home with wife Time For Goal Achievement: 10/05/14 Potential to Achieve Goals: Good Progress towards PT goals: Progressing toward goals    Frequency  Min 3X/week    PT Plan Current plan remains appropriate    Co-evaluation             End of Session           Time: 6295-2841 PT Time Calculation (min): 26 min  Charges:  $Gait Training: 23-37 mins                    G Codes:      Roney Marion Hamff 10/02/2014, 4:22 PM  Roney Marion, [place] Pager [phone] Office [phone]

## 2014-10-02 NOTE — Progress Notes (Signed)
Patient up all night uncomfortable with bladder and back. Patient unable to void well only 50cc at a time. I&O cath done 800cc drained and condom cath placed to monitor. Sbp also elevated prn med given however home dose [place] not yet order MD needs to review today. I will continue to monitor. Oncall P.A. Lamar Blinks notified and aware.

## 2014-10-02 NOTE — Progress Notes (Signed)
Patient has complained of urinary retention for last 24 hours.  Bladder scan on nite shift showed 100 cc, but I&O cath resulted in 800 cc, per report.  Today, condom cath collected only 100 cc, bladder scan showed 200 cc, and cath drained 1,000 cc.  Received order from Dr. Dyann Kief for indwelling, so leaving in.  Placed with sterile procedure per protocol, pericare done.  Patient/family education done.

## 2014-10-02 NOTE — Progress Notes (Signed)
Physical Therapy Treatment Note  SATURATION QUALIFICATIONS: (This note is used to comply with regulatory documentation for home oxygen)  Patient Saturations on Room Air at Rest = 92%  Patient Saturations on Room Air while Ambulating = 87%  Patient Saturations on 2 Liters of oxygen while Ambulating = 94%  Please briefly explain why patient needs home oxygen: Patient requires supplemental oxygen to maintain oxygen saturations at acceptable, safe levels with physical activity.   Roney [place], [place]  Acute Rehabilitation Services Pager [phone] Office [phone]

## 2014-10-02 NOTE — Progress Notes (Signed)
[facility] PROGRESS NOTE Interim History: 80 y.o. male with past medical history of CK stage III, chronic CHF and history of colon cancer status post colectomy with colostomy. Patient came in to the hospital because of shortness of breath. Patient started to have shortness of breath about 5 days ago, worsening about every day, he also reported generalized weakness to the point he couldn't change his colostomy bag. Patient reported significant weight loss since last year. His wife brought him to the hospital for further evaluation.  In the ED he was found to have creatinine of 2.6, baseline creatinine is 3.6 from March of 2015, his weight is 180 patient weighed 230 lbs in July of 2014.   Filed Weights   09/28/14 0457 09/29/14 0505 09/29/14 0900  Weight: 77.474 kg (170 lb 12.8 oz) 75.841 kg (167 lb 3.2 oz) 81 kg (178 lb 9.2 oz)        Intake/Output Summary (Last 24 hours) at 10/02/14 1833 Last data filed at 10/02/14 1600  Gross per 24 hour  Intake    481 ml  Output   1325 ml  Net   -844 ml     Assessment/Plan: Weakness generalized due to a. fib with slow ventricular response: -Appreciate cardiology on board.  -appreciate EP inputs and recommendations -s/p pacemaker implantation -b-blocker now resumed and titrated up -HR better controlled and device interrogated and working as intended  Acute on chronic diastolic HF (heart failure)/Acute respiratory failure: - Continue IV lasix; still with mild fluid overload signs and SOB - Strict I and O's. Monitor electrolytes. NO JVD on physical exam, 1-2+ edema. - Weight on admission 78 kg now 81 kg. estimated dry weight 74-75 kg -low sodium diet   CKD (chronic kidney disease) stage 4, GFR 15-29 ml/min - Baseline Cr 2.6-3.0. - cont to hold benazepril. -has remained stabled; will follow, especially with IV diuresis  Essential hypertension - mildly elevated  -lopressor increased by cardiology recommendations -will also start  bidil  Urinary retention and BPH: -required foley placement -continue proscar and flomax    Code Status: Full code  Family Communication: Plan discussed with the patient; no family at bedside Disposition Plan: home with home health services in 1-2 days (adjusting volume status; still SOB)   Consultants:  cardiology  Procedures: ECHO: estimated ejection fraction was in the range of 40% to 45%. Severe hypokinesis of the basal-midinferolateral, inferior, and inferoseptal myocardium. The study is not technically sufficient to allow evaluation of LV diastolic function.  10/01/14: pacemaker implantation   Antibiotics:  None  HPI/Subjective: Feeling better and breathing easier. Reports his strength has also improved. Patient overnight with urinary retention and need for foley placement (in and out cath X 2, before foley placement). No fever.  Objective: Filed Vitals:   10/02/14 0400 10/02/14 0500 10/02/14 0700 10/02/14 1215  BP: 142/91 151/61 177/54 157/50  Pulse: 73 70 87 83  Temp:   97.7 F (36.5 C) 97.8 F (36.6 C)  TempSrc:   Oral Oral  Resp:   18 18  Height:      Weight:      SpO2: 100% 99% 99% 97%     Exam:  General: AAOX3, in no acute distress; reports he is breathing easier and feeling stronger  HEENT: No bruits, no goiter. -JVD Heart: regular rate; no rubs or gallops; LE edema bilaterally 1+ Lungs: decrease BS bibasilar; no wheezing  Abdomen: Soft, nontender, no guarding; colostomy bag with liquid material in place  Data Reviewed: Basic Metabolic  Panel:  Recent Labs Lab 09/27/14 1155 09/27/14 1814 09/28/14 0345 09/30/14 0248 10/01/14 0235 10/02/14 0522  NA 143  --  142 139 140 138  K 3.2*  --  3.2* 3.9 4.2 4.7  CL 95*  --  96 95* 97 97  CO2 35*  --  37* 36* 34* 30  GLUCOSE 104*  --  80 102* 117* 125*  BUN 22  --  23 28* 35* 40*  CREATININE 2.63*  --  2.65* 2.70* 2.53* 2.54*  CALCIUM 10.3  --  9.7 9.6 9.4 9.9  MG  --  2.1  --   --   --   --     Liver Function Tests:  Recent Labs Lab 09/28/14 0345  AST 20  ALT 12  ALKPHOS 65  BILITOT 1.1  PROT 6.1  ALBUMIN 3.5   CBC:  Recent Labs Lab 09/27/14 1155 09/28/14 0345 09/30/14 0248 10/01/14 0235 10/02/14 0522  WBC 6.4 6.6 5.4 5.3 9.8  HGB 11.9* 10.7* 10.9* 10.7* 11.1*  HCT 36.0* 32.7* 33.6* 32.2* 33.3*  MCV 97.6 97.6 99.4 98.8 96.5  PLT 102* 86* 77* 76* 91*   Cardiac Enzymes:  Recent Labs Lab 09/27/14 1155  TROPONINI <0.30   BNP (last 3 results)  Recent Labs  09/27/14 1155  PROBNP 7803.0*    Recent Results (from the past 240 hour(s))  MRSA PCR SCREENING     Status: None   Collection Time    09/29/14  8:55 AM      Result Value Ref Range Status   MRSA by PCR NEGATIVE  NEGATIVE Final   Comment:            The GeneXpert MRSA Assay (FDA     approved for NASAL specimens     only), is one component of a     comprehensive MRSA colonization     surveillance program. It is not     intended to diagnose MRSA     infection nor to guide or     monitor treatment for     MRSA infections.     Studies: Dg Chest Port 1 View  10/02/2014   CLINICAL DATA:  Initial encounter for pacemaker placement  EXAM: PORTABLE CHEST - 1 VIEW  COMPARISON:  09/27/2014.  FINDINGS: 0818 hrs. The cardio pericardial silhouette is enlarged. Pulmonary vascular congestion noted with central in basilar interstitial and airspace disease suggesting edema. Probable small layering pleural effusions. Left-sided dual lead permanent pacemaker is new in the interval. No evidence for pneumothorax. The cardio pericardial silhouette is enlarged. Telemetry leads overlie the chest.  IMPRESSION: New left-sided permanent pacemaker without evidence for pneumothorax.  Interval development of interstitial in basilar airspace disease suggesting edema.   Electronically Signed   By: Misty Stanley M.D.   On: 10/02/2014 08:03    Scheduled Meds: . amoxicillin-clavulanate  500 mg Oral Q12H  . feeding supplement  (ENSURE COMPLETE)  237 mL Oral Q24H  . feeding supplement (ENSURE)  1 Container Oral Q24H  . finasteride  2.5 mg Oral Daily  . furosemide  40 mg Intravenous BID  . isosorbide-hydrALAZINE  1 tablet Oral BID  . metoprolol succinate  50 mg Oral Daily  . pantoprazole  40 mg Oral Daily  . sodium chloride  3 mL Intravenous Q12H  . tamsulosin  0.4 mg Oral QPC supper  . warfarin  4 mg Oral ONCE-1800  . Warfarin - Pharmacist Dosing Inpatient   Does not apply q1800   Continuous Infusions:  Time: 30 minutes  Barton Dubois  [facility] Pager [phone]  If 8PM-8AM, please contact night-coverage at [email or web address], password [facility] 10/02/2014, 6:33 PM  LOS: 5 days

## 2014-10-02 NOTE — Progress Notes (Signed)
ANTICOAGULATION CONSULT NOTE - Initial Consult  Pharmacy Consult for coumadin Indication: atrial fibrillation  Allergies  Allergen Reactions  . Cephalexin Other (See Comments)    "too strong for my system" - per office note 05/05/2012  . Ciprofloxacin Other (See Comments)    Reaction unknown  . Hctz [Hydrochlorothiazide]     DIZZINESS   . Levofloxacin Other (See Comments)    "too strong for my system" - per office note 05/05/2012  . Lipitor [Atorvastatin Calcium]     MUSCLE PAIN  . Niaspan [Niacin Er]     FLUSHING   . Oxycodone-Acetaminophen Other (See Comments)    Reaction unknown  . Sulfonamide Derivatives Other (See Comments)    Reaction unkonwn    Patient Measurements: Height: 5\' 10"  (177.8 cm) Weight: 178 lb 9.2 oz (81 kg) IBW/kg (Calculated) : 73 Heparin Dosing Weight:   Vital Signs: Temp: 97.7 F (36.5 C) (10/06 2319) Temp Source: Oral (10/06 2319) BP: 177/54 mmHg (10/07 0700) Pulse Rate: 70 (10/07 0500)  Labs:  Recent Labs  09/30/14 0248 10/01/14 0235 10/02/14 0522  HGB 10.9* 10.7* 11.1*  HCT 33.6* 32.2* 33.3*  PLT 77* 76* 91*  LABPROT 28.5* 25.8* 20.7*  INR 2.68* 2.36* 1.78*  CREATININE 2.70* 2.53* 2.54*    Estimated Creatinine Clearance: 24.7 ml/min (by C-G formula based on Cr of 2.54).   Medical History: Past Medical History  Diagnosis Date  . 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 Pure hypercholesterolemia     takes Vytorin daily  . Personal history of colonic polyps     adenomatous  . Diverticulosis of colon (without mention of hemorrhage)   . Cardiovascular disease   . IHD (ischemic heart disease)   . History of colon cancer   . Colon cancer   . Hypertension     takes Amlodipine,Metoprolol,and Cardura daily  . Coronary artery disease   . Acute myocardial infarction, unspecified site, episode of care unspecified   . MI (myocardial infarction) 1993  . CHF (congestive heart failure)     takes Lasix daily  . H/O blood clots 1993   . Dysrhythmia   . Shortness of breath     with exertion  . Arthritis   . Joint pain   . Joint swelling   . Edema     to right leg-below knee;not any different than when he saw brackbill in jan 2013  . Bruises easily     takes ASA daily  . Skin cancer     nose  . GERD (gastroesophageal reflux disease)     takes Protonix daily  . Diarrhea   . Constipation   . Enlarged prostate     takes Finasteride daily  . Blood transfusion 2008  . Insomnia   . Hx of CABG     1993  . Ejection fraction     EF 51%, nuclear, 2010  . Pyelonephritis 11/20/2013  . CKD (chronic kidney disease) stage 4, GFR 15-29 ml/min     Medications:  Scheduled:  . amoxicillin-clavulanate  500 mg Oral Q12H  . feeding supplement (ENSURE COMPLETE)  237 mL Oral Q24H  . feeding supplement (ENSURE)  1 Container Oral Q24H  . finasteride  2.5 mg Oral Daily  . furosemide  40 mg Intravenous BID  . isosorbide-hydrALAZINE  1 tablet Oral BID  . metoprolol succinate  50 mg Oral Daily  . pantoprazole  40 mg Oral Daily  . sodium chloride  3 mL Intravenous Q12H  . tamsulosin  0.4 mg Oral QPC supper  . warfarin  4 mg Oral ONCE-1800  . Warfarin - Pharmacist Dosing Inpatient   Does not apply q1800  . you have a pacemaker book   Does not apply Once   Infusions:    Assessment: 80 yo male with afib s/p pacer will be resumed on coumadin per MD's request.  INR today is down to 1.78 since have been off coumadin for ~ 5 days. Goal of Therapy:  INR 2-3 Monitor platelets by anticoagulation protocol: Yes   Plan:  - coumadin 4mg  po x1 - INR in am  Adelaida Reindel, Tsz-Yin 10/02/2014,8:40 AM

## 2014-10-02 NOTE — Progress Notes (Signed)
Subjective: The patient reports feeling ok.  No SOB  Objective: Vital signs in last 24 hours: Temp:  [97.3 F (36.3 C)-98 F (36.7 C)] 97.7 F (36.5 C) (10/06 2319) Pulse Rate:  [30-106] 70 (10/07 0500) Resp:  [12-18] 18 (10/06 2319) BP: (94-213)/(46-91) 151/61 mmHg (10/07 0500) SpO2:  [93 %-100 %] 99 % (10/07 0500) Last BM Date: 10/01/14  Intake/Output from previous day: 10/06 0701 - 10/07 0700 In: 880 [P.O.:480; I.V.:400] Out: 525 [Urine:525] Intake/Output this shift:    Medications Current Facility-Administered Medications  Medication Dose Route Frequency Provider Last Rate Last Dose  . 0.9 %  sodium chloride infusion  250 mL Intravenous PRN Thompson Grayer, MD      . acetaminophen (TYLENOL) tablet 325-650 mg  325-650 mg Oral Q4H PRN Thompson Grayer, MD   650 mg at 10/01/14 2115  . alum & mag hydroxide-simeth (MAALOX/MYLANTA) 200-200-20 MG/5ML suspension 30 mL  30 mL Oral Q6H PRN Verlee Monte, MD      . amoxicillin-clavulanate (AUGMENTIN) 500-125 MG per tablet 500 mg  500 mg Oral Q12H Charlynne Cousins, MD   500 mg at 10/01/14 2126  . feeding supplement (ENSURE COMPLETE) (ENSURE COMPLETE) liquid 237 mL  237 mL Oral Q24H Erlene Quan, RD      . feeding supplement (ENSURE) (ENSURE) pudding 1 Container  1 Container Oral Q24H Erlene Quan, RD      . finasteride (PROSCAR) tablet 5 mg  5 mg Oral Daily Verlee Monte, MD   5 mg at 10/01/14 1010  . metoprolol succinate (TOPROL-XL) 24 hr tablet 12.5 mg  12.5 mg Oral Daily Thompson Grayer, MD      . morphine 2 MG/ML injection 1 mg  1 mg Intravenous Q4H PRN Verlee Monte, MD   1 mg at 10/01/14 2304  . ondansetron (ZOFRAN) injection 4 mg  4 mg Intravenous Q6H PRN Thompson Grayer, MD      . pantoprazole (PROTONIX) EC tablet 40 mg  40 mg Oral Daily Verlee Monte, MD   40 mg at 10/01/14 1010  . sodium chloride 0.9 % injection 3 mL  3 mL Intravenous Q12H Thompson Grayer, MD      . sodium chloride 0.9 % injection 3 mL  3 mL Intravenous PRN  Thompson Grayer, MD      . Warfarin - Physician Dosing Inpatient   Does not apply [id number] Thompson Grayer, MD      . you have a pacemaker book   Does not apply Once Charlynne Cousins, MD        PE: General appearance: alert, cooperative and no distress Lungs: Decreased BS bilaterally.  Worse on the lower right with rales.   Heart: irregularly irregular rhythm Abdomen: +BS.Marland Kitchen Nontender.  no distention Extremities: No LEE Pulses: 2+ and symmetric Skin: Warm and dry Neurologic: Grossly normal  Lab Results:   Recent Labs  09/30/14 0248 10/01/14 0235 10/02/14 0522  WBC 5.4 5.3 9.8  HGB 10.9* 10.7* 11.1*  HCT 33.6* 32.2* 33.3*  PLT 77* 76* 91*   BMET  Recent Labs  09/30/14 0248 10/01/14 0235 10/02/14 0522  NA 139 140 138  K 3.9 4.2 4.7  CL 95* 97 97  CO2 36* 34* 30  GLUCOSE 102* 117* 125*  BUN 28* 35* 40*  CREATININE 2.70* 2.53* 2.54*  CALCIUM 9.6 9.4 9.9   PT/INR  Recent Labs  09/30/14 0248 10/01/14 0235 10/02/14 0522  LABPROT 28.5* 25.8* 20.7*  INR 2.68* 2.36* 1.78*  Assessment/Plan   Principal Problem:   Acute on chronic diastolic HF (heart failure) Active Problems:   Essential hypertension   Atrial fibrillation with slow ventricular response   Weakness generalized   CKD (chronic kidney disease) stage 4, GFR 15-29 ml/min   NSVT  Plan:  SP implantation of [employer]ure Quadra RFBiV PPM for complete HB.  Minimal bleeding at pacer site.  Frequent NSVT on tele.  CXr shows no pneumothorax and worsening CHF(offical read pending), although his LEE has improved considerably. Starting Toprol 50 this morning and adding IV lasix 40mg  BID.  Net fluids thus far: +78ml.   Wt ??.    LOS: 5 days    HAGER, BRYAN PA-C 10/02/2014 7:41 AM  I have seen, examined the patient, and reviewed the above assessment and plan.  Changes to above are made where necessary.  CXR reveals significant worsening of edema.  Will give IV lasix today. NSVT noted.  Will increase  toprol. Resume coumadin Would keep in the hospital another day or so for diuresis and management of NSVT Device interrogation is reviewed and normal.  Co Sign: Thompson Grayer, MD 10/02/2014 8:23 AM

## 2014-10-03 DIAGNOSIS — E8770 Fluid overload, unspecified: Secondary | ICD-10-CM

## 2014-10-03 LAB — CBC
HEMATOCRIT: 32.5 % — AB (ref 39.0–52.0)
Hemoglobin: 10.6 g/dL — ABNORMAL LOW (ref 13.0–17.0)
MCH: 31.7 pg (ref 26.0–34.0)
MCHC: 32.6 g/dL (ref 30.0–36.0)
MCV: 97.3 fL (ref 78.0–100.0)
PLATELETS: 94 10*3/uL — AB (ref 150–400)
RBC: 3.34 MIL/uL — ABNORMAL LOW (ref 4.22–5.81)
RDW: 14 % (ref 11.5–15.5)
WBC: 7.7 10*3/uL (ref 4.0–10.5)

## 2014-10-03 LAB — BASIC METABOLIC PANEL
Anion gap: 10 (ref 5–15)
BUN: 46 mg/dL — ABNORMAL HIGH (ref 6–23)
CHLORIDE: 97 meq/L (ref 96–112)
CO2: 34 mEq/L — ABNORMAL HIGH (ref 19–32)
Calcium: 10.1 mg/dL (ref 8.4–10.5)
Creatinine, Ser: 2.79 mg/dL — ABNORMAL HIGH (ref 0.50–1.35)
GFR calc Af Amer: 23 mL/min — ABNORMAL LOW (ref >90)
GFR, EST NON AFRICAN AMERICAN: 20 mL/min — AB (ref >90)
GLUCOSE: 113 mg/dL — AB (ref 70–99)
Potassium: 4.2 mEq/L (ref 3.7–5.3)
SODIUM: 141 meq/L (ref 137–147)

## 2014-10-03 LAB — PROTIME-INR
INR: 1.77 — ABNORMAL HIGH (ref 0.00–1.49)
Prothrombin Time: 20.6 seconds — ABNORMAL HIGH (ref 11.6–15.2)

## 2014-10-03 MED ORDER — WARFARIN SODIUM 3 MG PO TABS
3.0000 mg | ORAL_TABLET | Freq: Once | ORAL | Status: AC
  Administered 2014-10-03: 3 mg via ORAL
  Filled 2014-10-03: qty 1

## 2014-10-03 MED ORDER — FUROSEMIDE 40 MG PO TABS
40.0000 mg | ORAL_TABLET | Freq: Every day | ORAL | Status: DC
  Administered 2014-10-04 – 2014-10-05 (×2): 40 mg via ORAL
  Filled 2014-10-03 (×2): qty 1

## 2014-10-03 MED ORDER — METOPROLOL SUCCINATE ER 100 MG PO TB24
100.0000 mg | ORAL_TABLET | Freq: Every day | ORAL | Status: DC
  Administered 2014-10-04 – 2014-10-05 (×2): 100 mg via ORAL
  Filled 2014-10-03 (×2): qty 1

## 2014-10-03 MED ORDER — FINASTERIDE 5 MG PO TABS
5.0000 mg | ORAL_TABLET | Freq: Every day | ORAL | Status: DC
  Administered 2014-10-04 – 2014-10-05 (×2): 5 mg via ORAL
  Filled 2014-10-03 (×2): qty 1

## 2014-10-03 NOTE — Progress Notes (Deleted)
UR completed Britnay Magnussen K. Kirah Stice, RN, BSN, [place], CCM  10/03/2014 2:39 PM

## 2014-10-03 NOTE — Plan of Care (Signed)
Problem: Phase I Progression Outcomes Goal: EF % per last Echo/documented,Core Reminder form on chart Outcome: Completed/Met Date Met:  10/03/14 EF= 40-45%

## 2014-10-03 NOTE — Progress Notes (Signed)
Report given to receiving RN. Patient in bed sleeping. No signs or symptoms of distress or discomfort. 

## 2014-10-03 NOTE — Progress Notes (Signed)
Patient has sacral dressing on dry, secure and intact this morning. Rechecked and edges curling on bottom of dressing and area below dressing red and sore with no drainage noted. Dressing removed and placed lower to cover red/sore area. Patients wife stated they had an appointment to see a doctor about the red/sore sacral area prior to patients admission to hospital. Wife states she believes the skin problem is related to patient sitting in chair all the time. Discussed moving from side to side to alleviate pressure on sacral area. Wife and patient verbalize understanding.

## 2014-10-03 NOTE — Progress Notes (Signed)
ANTICOAGULATION CONSULT NOTE - Follow Up Consult  Pharmacy Consult for coumadin Indication: atrial fibrillation  Allergies  Allergen Reactions  . Cephalexin Other (See Comments)    "too strong for my system" - per office note 05/05/2012  . Ciprofloxacin Other (See Comments)    Reaction unknown  . Hctz [Hydrochlorothiazide]     DIZZINESS   . Levofloxacin Other (See Comments)    "too strong for my system" - per office note 05/05/2012  . Lipitor [Atorvastatin Calcium]     MUSCLE PAIN  . Niaspan [Niacin Er]     FLUSHING   . Oxycodone-Acetaminophen Other (See Comments)    Reaction unknown  . Sulfonamide Derivatives Other (See Comments)    Reaction unkonwn    Patient Measurements: Height: 5\' 10"  (177.8 cm) Weight: 167 lb 8.8 oz (76 kg) IBW/kg (Calculated) : 73 Heparin Dosing Weight:   Vital Signs: Temp: 97.4 F (36.3 C) (10/08 0532) Temp Source: Oral (10/08 0532) BP: 152/64 mmHg (10/08 0640) Pulse Rate: 79 (10/08 0532)  Labs:  Recent Labs  10/01/14 0235 10/02/14 0522 10/03/14 0354  HGB 10.7* 11.1* 10.6*  HCT 32.2* 33.3* 32.5*  PLT 76* 91* 94*  LABPROT 25.8* 20.7* 20.6*  INR 2.36* 1.78* 1.77*  CREATININE 2.53* 2.54* 2.79*    Estimated Creatinine Clearance: 22.5 ml/min (by C-G formula based on Cr of 2.79).   Medications:  Scheduled:  . amoxicillin-clavulanate  500 mg Oral Q12H  . feeding supplement (ENSURE COMPLETE)  237 mL Oral Q24H  . feeding supplement (ENSURE)  1 Container Oral Q24H  . finasteride  2.5 mg Oral Daily  . furosemide  40 mg Intravenous BID  . isosorbide-hydrALAZINE  1 tablet Oral BID  . metoprolol succinate  50 mg Oral Daily  . pantoprazole  40 mg Oral Daily  . sodium chloride  3 mL Intravenous Q12H  . tamsulosin  0.4 mg Oral QPC supper  . Warfarin - Pharmacist Dosing Inpatient   Does not apply q1800   Infusions:    Assessment: 80 yo male with afib is currently on subthearpeutic coumadin.  INR today is stable at 1.77 probably due to  dose being held x 5 days and was just resumed yesterday.   Goal of Therapy:  INR 2-3 Monitor platelets by anticoagulation protocol: Yes   Plan:  - coumadin 3mg  po x - INR in am  Izea Livolsi, Tsz-Yin 10/03/2014,8:34 AM

## 2014-10-03 NOTE — Progress Notes (Signed)
SUBJECTIVE: The patient is doing well today.  At this time, he denies chest pain, shortness of breath, or any new concerns.  He states breathing is improved.    PT recommending HHPT at discharge.   I/O -1.8L yesterday, weight down 11 pounds since 09/29/14.  + urinary retention  CURRENT MEDICATIONS: . amoxicillin-clavulanate  500 mg Oral Q12H  . feeding supplement (ENSURE COMPLETE)  237 mL Oral Q24H  . feeding supplement (ENSURE)  1 Container Oral Q24H  . finasteride  2.5 mg Oral Daily  . furosemide  40 mg Intravenous BID  . isosorbide-hydrALAZINE  1 tablet Oral BID  . metoprolol succinate  50 mg Oral Daily  . pantoprazole  40 mg Oral Daily  . sodium chloride  3 mL Intravenous Q12H  . tamsulosin  0.4 mg Oral QPC supper  . Warfarin - Pharmacist Dosing Inpatient   Does not apply q1800      OBJECTIVE: Physical Exam: Filed Vitals:   10/02/14 1900 10/02/14 2008 10/03/14 0002 10/03/14 0532  BP: 131/87  145/65 165/71  Pulse: 77  80 79  Temp: 97.7 F (36.5 C) 97.3 F (36.3 C) 97.3 F (36.3 C) 97.4 F (36.3 C)  TempSrc: Oral Oral Oral Oral  Resp: 16 16 16 16   Height:      Weight:    167 lb 8.8 oz (76 kg)  SpO2: 98% 99% 95% 100%    Intake/Output Summary (Last 24 hours) at 10/03/14 1443 Last data filed at 10/03/14 0533  Gross per 24 hour  Intake    601 ml  Output   3200 ml  Net  -2599 ml    Telemetry reveals atrial fibrillation with ventricular pacing, frequent ventricular ectopy (burden improved from yesterday)  GEN- The patient is chronically ill appearing, alert and oriented x 3 today.   Head- normocephalic, atraumatic Eyes-  Sclera clear, conjunctiva pink Ears- hearing intact Oropharynx- clear Neck- supple  Lungs- few basilar rales, normal work of breathing Heart- Regular rate and rhythm, no murmurs, rubs or gallops, PMI not laterally displaced GI- soft, NT, ND, + BS Extremities- no clubbing, cyanosis, + dependant edema   LABS: Basic Metabolic  Panel:  Recent Labs  10/02/14 0522 10/03/14 0354  NA 138 141  K 4.7 4.2  CL 97 97  CO2 30 34*  GLUCOSE 125* 113*  BUN 40* 46*  CREATININE 2.54* 2.79*  CALCIUM 9.9 10.1   CBC:  Recent Labs  10/02/14 0522 10/03/14 0354  WBC 9.8 7.7  HGB 11.1* 10.6*  HCT 33.3* 32.5*  MCV 96.5 97.3  PLT 91* 94*    RADIOLOGY: Dg Chest Port 1 View 10/02/2014   CLINICAL DATA:  Initial encounter for pacemaker placement  EXAM: PORTABLE CHEST - 1 VIEW  COMPARISON:  09/27/2014.  FINDINGS: 0818 hrs. The cardio pericardial silhouette is enlarged. Pulmonary vascular congestion noted with central in basilar interstitial and airspace disease suggesting edema. Probable small layering pleural effusions. Left-sided dual lead permanent pacemaker is new in the interval. No evidence for pneumothorax. The cardio pericardial silhouette is enlarged. Telemetry leads overlie the chest.  IMPRESSION: New left-sided permanent pacemaker without evidence for pneumothorax.  Interval development of interstitial in basilar airspace disease suggesting edema.   Electronically Signed   By: Misty Stanley M.D.   On: 10/02/2014 08:03    ASSESSMENT AND PLAN:  Principal Problem:   Acute on chronic diastolic HF (heart failure) Active Problems:   Essential hypertension   Atrial fibrillation with slow ventricular response  Weakness generalized   CKD (chronic kidney disease) stage 4, GFR 15-29 ml/min   1. afib with slow ventricular response Doing well s/p PPM Routine wound care and follow-up Resume coumadin  2. Renal failure Urinary retention noted Primary team is working with patient on this  3. NSVT Improved with metoprolol  4. Acute on chronic systolic dysfunction Improving with lasix Return to oral lasix and monitor  Electrophysiology team to see as needed while here. Please call with questions.

## 2014-10-03 NOTE — Progress Notes (Signed)
UR completed Abimelec Grochowski K. Gracelynne Benedict, RN, BSN, MSHL, CCM  10/03/2014 2:22 PM

## 2014-10-03 NOTE — Progress Notes (Signed)
[facility] PROGRESS NOTE Interim History: 80 y.o. male with past medical history of CK stage III, chronic CHF and history of colon cancer status post colectomy with colostomy. Patient came in to the hospital because of shortness of breath. Patient started to have shortness of breath about 5 days ago, worsening about every day, he also reported generalized weakness to the point he couldn't change his colostomy bag. Patient reported significant weight loss since last year. His wife brought him to the hospital for further evaluation.  In the ED he was found to have creatinine of 2.6, baseline creatinine is 3.6 from March of 2015, his weight is 180 patient weighed 230 lbs in July of 2014.   Filed Weights   09/29/14 0900 10/03/14 0532 10/03/14 1609  Weight: 81 kg (178 lb 9.2 oz) 76 kg (167 lb 8.8 oz) 75 kg (165 lb 5.5 oz)        Intake/Output Summary (Last 24 hours) at 10/03/14 1725 Last data filed at 10/03/14 1400  Gross per 24 hour  Intake    360 ml  Output   2500 ml  Net  -2140 ml     Assessment/Plan: Weakness generalized due to a. fib with slow ventricular response: -Appreciate cardiology on board.  -appreciate EP inputs and recommendations -s/p pacemaker implantation -b-blocker now resumed and titrated up -HR better controlled and device interrogated and working as intended  Acute on chronic diastolic HF (heart failure)/Acute respiratory failure: - Continue IV lasix; still with mild fluid overload signs and SOB - Strict I and O's. Monitor electrolytes. NO JVD on physical exam, 1-2+ edema. - Weight on admission 78 kg now 76kg. estimated dry weight 74-75 kg -low sodium diet   CKD (chronic kidney disease) stage 4, GFR 15-29 ml/min - Baseline Cr 2.6-3.0. -cont to hold benazepril for now. -has remained stable; will follow, especially with diuresis -current Cr 2.79 -lasix change to PO  Essential hypertension -mildly elevated  -lopressor increased by cardiology  recommendations -will continue bidil  Urinary retention and BPH: -required foley placement -continue proscar and flomax -will attempt voiding trial in am    Code Status: Full code  Family Communication: Plan discussed with the patient; no family at bedside Disposition Plan: home with home health services in 1-2 days (adjusting volume status; still SOB)   Consultants:  cardiology  Procedures: ECHO: estimated ejection fraction was in the range of 40% to 45%. Severe hypokinesis of the basal-midinferolateral, inferior, and inferoseptal myocardium. The study is not technically sufficient to allow evaluation of LV diastolic function.  10/01/14: pacemaker implantation   Antibiotics:  None  HPI/Subjective: Feeling better and breathing much easier. No fever. Denies CP. Good urine output  Objective: Filed Vitals:   10/03/14 0532 10/03/14 0640 10/03/14 1348 10/03/14 1609  BP: 165/71 152/64 131/40 144/58  Pulse: 79  85 88  Temp: 97.4 F (36.3 C)  97.7 F (36.5 C) 97.5 F (36.4 C)  TempSrc: Oral  Oral Oral  Resp: 16  18   Height:    5\' 10"  (1.778 m)  Weight: 76 kg (167 lb 8.8 oz)   75 kg (165 lb 5.5 oz)  SpO2: 100%  100% 99%     Exam:  General: AAOX3, in no acute distress; reports he is breathing much easier and feeling stronger  HEENT: No bruits, no goiter. -JVD Heart: regular rate; no rubs or gallops; trace LE edema bilaterally Lungs: decrease BS bibasilar; no wheezing; no frank crackles  Abdomen: Soft, nontender, no guarding; colostomy bag  with liquid material in place  Data Reviewed: Basic Metabolic Panel:  Recent Labs Lab 09/27/14 1814 09/28/14 0345 09/30/14 0248 10/01/14 0235 10/02/14 0522 10/03/14 0354  NA  --  142 139 140 138 141  K  --  3.2* 3.9 4.2 4.7 4.2  CL  --  96 95* 97 97 97  CO2  --  37* 36* 34* 30 34*  GLUCOSE  --  80 102* 117* 125* 113*  BUN  --  23 28* 35* 40* 46*  CREATININE  --  2.65* 2.70* 2.53* 2.54* 2.79*  CALCIUM  --  9.7 9.6 9.4  9.9 10.1  MG 2.1  --   --   --   --   --    Liver Function Tests:  Recent Labs Lab 09/28/14 0345  AST 20  ALT 12  ALKPHOS 65  BILITOT 1.1  PROT 6.1  ALBUMIN 3.5   CBC:  Recent Labs Lab 09/28/14 0345 09/30/14 0248 10/01/14 0235 10/02/14 0522 10/03/14 0354  WBC 6.6 5.4 5.3 9.8 7.7  HGB 10.7* 10.9* 10.7* 11.1* 10.6*  HCT 32.7* 33.6* 32.2* 33.3* 32.5*  MCV 97.6 99.4 98.8 96.5 97.3  PLT 86* 77* 76* 91* 94*   Cardiac Enzymes:  Recent Labs Lab 09/27/14 1155  TROPONINI <0.30   BNP (last 3 results)  Recent Labs  09/27/14 1155  PROBNP 7803.0*    Recent Results (from the past 240 hour(s))  MRSA PCR SCREENING     Status: None   Collection Time    09/29/14  8:55 AM      Result Value Ref Range Status   MRSA by PCR NEGATIVE  NEGATIVE Final   Comment:            The GeneXpert MRSA Assay (FDA     approved for NASAL specimens     only), is one component of a     comprehensive MRSA colonization     surveillance program. It is not     intended to diagnose MRSA     infection nor to guide or     monitor treatment for     MRSA infections.     Studies: Dg Chest Port 1 View  10/02/2014   CLINICAL DATA:  Initial encounter for pacemaker placement  EXAM: PORTABLE CHEST - 1 VIEW  COMPARISON:  09/27/2014.  FINDINGS: 0818 hrs. The cardio pericardial silhouette is enlarged. Pulmonary vascular congestion noted with central in basilar interstitial and airspace disease suggesting edema. Probable small layering pleural effusions. Left-sided dual lead permanent pacemaker is new in the interval. No evidence for pneumothorax. The cardio pericardial silhouette is enlarged. Telemetry leads overlie the chest.  IMPRESSION: New left-sided permanent pacemaker without evidence for pneumothorax.  Interval development of interstitial in basilar airspace disease suggesting edema.   Electronically Signed   By: Misty Stanley M.D.   On: 10/02/2014 08:03    Scheduled Meds: . amoxicillin-clavulanate   500 mg Oral Q12H  . feeding supplement (ENSURE COMPLETE)  237 mL Oral Q24H  . feeding supplement (ENSURE)  1 Container Oral Q24H  . [START ON 10/04/2014] finasteride  5 mg Oral Daily  . [START ON 10/04/2014] furosemide  40 mg Oral Daily  . isosorbide-hydrALAZINE  1 tablet Oral BID  . [START ON 10/04/2014] metoprolol succinate  100 mg Oral Daily  . pantoprazole  40 mg Oral Daily  . sodium chloride  3 mL Intravenous Q12H  . tamsulosin  0.4 mg Oral QPC supper  . warfarin  3 mg  Oral ONCE-1800  . Warfarin - Pharmacist Dosing Inpatient   Does not apply q1800   Continuous Infusions:   Time: 30 minutes  Barton Dubois  [facility] Pager [phone]  If 8PM-8AM, please contact night-coverage at [email or web address], password [facility] 10/03/2014, 5:25 PM  LOS: 6 days

## 2014-10-04 LAB — PROTIME-INR
INR: 1.66 — AB (ref 0.00–1.49)
Prothrombin Time: 19.6 seconds — ABNORMAL HIGH (ref 11.6–15.2)

## 2014-10-04 LAB — CBC
HCT: 32.7 % — ABNORMAL LOW (ref 39.0–52.0)
Hemoglobin: 10.6 g/dL — ABNORMAL LOW (ref 13.0–17.0)
MCH: 32.5 pg (ref 26.0–34.0)
MCHC: 32.4 g/dL (ref 30.0–36.0)
MCV: 100.3 fL — ABNORMAL HIGH (ref 78.0–100.0)
Platelets: 99 10*3/uL — ABNORMAL LOW (ref 150–400)
RBC: 3.26 MIL/uL — AB (ref 4.22–5.81)
RDW: 14 % (ref 11.5–15.5)
WBC: 7 10*3/uL (ref 4.0–10.5)

## 2014-10-04 LAB — BASIC METABOLIC PANEL
ANION GAP: 10 (ref 5–15)
BUN: 47 mg/dL — ABNORMAL HIGH (ref 6–23)
CO2: 35 meq/L — AB (ref 19–32)
Calcium: 10.1 mg/dL (ref 8.4–10.5)
Chloride: 97 mEq/L (ref 96–112)
Creatinine, Ser: 2.87 mg/dL — ABNORMAL HIGH (ref 0.50–1.35)
GFR calc Af Amer: 23 mL/min — ABNORMAL LOW (ref >90)
GFR calc non Af Amer: 20 mL/min — ABNORMAL LOW (ref >90)
Glucose, Bld: 93 mg/dL (ref 70–99)
POTASSIUM: 4.1 meq/L (ref 3.7–5.3)
SODIUM: 142 meq/L (ref 137–147)

## 2014-10-04 MED ORDER — WARFARIN SODIUM 4 MG PO TABS
4.0000 mg | ORAL_TABLET | Freq: Once | ORAL | Status: AC
  Administered 2014-10-04: 4 mg via ORAL
  Filled 2014-10-04: qty 1

## 2014-10-04 MED ORDER — WARFARIN 0.5 MG HALF TABLET
4.5000 mg | ORAL_TABLET | Freq: Once | ORAL | Status: DC
  Filled 2014-10-04: qty 1

## 2014-10-04 NOTE — Progress Notes (Signed)
37F foley catheter placed. Patient tolerated it well.

## 2014-10-04 NOTE — Progress Notes (Signed)
Report given to receiving RN. Patient in bed sleeping. No signs or symptoms of distress or discomfort. 

## 2014-10-04 NOTE — Progress Notes (Signed)
Bladder scan completed. Patient unsuccessfully unable to void. Bladder scanner reveled, patient retaining 314ml of urine. MD notified and made aware. New orders given to place foley catheter. Patient has no verbal complaints of pain or discomfort noted.

## 2014-10-04 NOTE — Progress Notes (Addendum)
ANTICOAGULATION CONSULT NOTE - Follow Up Consult  Pharmacy Consult for coumadin Indication: atrial fibrillation  Allergies  Allergen Reactions  . Cephalexin Other (See Comments)    "too strong for my system" - per office note 05/05/2012  . Ciprofloxacin Other (See Comments)    Reaction unknown  . Hctz [Hydrochlorothiazide]     DIZZINESS   . Levofloxacin Other (See Comments)    "too strong for my system" - per office note 05/05/2012  . Lipitor [Atorvastatin Calcium]     MUSCLE PAIN  . Niaspan [Niacin Er]     FLUSHING   . Oxycodone-Acetaminophen Other (See Comments)    Reaction unknown  . Sulfonamide Derivatives Other (See Comments)    Reaction unkonwn    Patient Measurements: Height: 5\' 10"  (177.8 cm) Weight: 165 lb 5.5 oz (75 kg) IBW/kg (Calculated) : 73 Heparin Dosing Weight:   Vital Signs: Temp: 98.3 F (36.8 C) (10/09 0457) Temp Source: Oral (10/09 0457) BP: 111/56 mmHg (10/09 0903) Pulse Rate: 69 (10/09 0903)  Labs:  Recent Labs  10/02/14 0522 10/03/14 0354 10/04/14 0455  HGB 11.1* 10.6* 10.6*  HCT 33.3* 32.5* 32.7*  PLT 91* 94* 99*  LABPROT 20.7* 20.6* 19.6*  INR 1.78* 1.77* 1.66*  CREATININE 2.54* 2.79* 2.87*    Estimated Creatinine Clearance: 21.9 ml/min (by C-G formula based on Cr of 2.87).   Medications:  Scheduled:  . feeding supplement (ENSURE COMPLETE)  237 mL Oral Q24H  . feeding supplement (ENSURE)  1 Container Oral Q24H  . finasteride  5 mg Oral Daily  . furosemide  40 mg Oral Daily  . isosorbide-hydrALAZINE  1 tablet Oral BID  . metoprolol succinate  100 mg Oral Daily  . pantoprazole  40 mg Oral Daily  . sodium chloride  3 mL Intravenous Q12H  . tamsulosin  0.4 mg Oral QPC supper  . Warfarin - Pharmacist Dosing Inpatient   Does not apply q1800   Infusions:    Assessment: INR today = 1.66, down from 1.77 yesterday in this 80 yo male with h/o afib. PTA coumadin dose was 3mg  daily except 4.5 mg qWed-last taken PTA on 09/26/14.  Admitted for c/o SOB, acute on chronic diastolic HF on 76/7/20, INR was 3.6 on admit date and up to 4.0 the next morning 10/3.  Coumadin held on admit for pacemaker implantation 10/6; resumed on 10/7.  INR remains subtherapeutic due to coumadin held x 5 days.  Expect INR to start to increase soon from previous 2 days' coumadin doses. Nutritional consultant notes Pt meal intake is varied from 10-100%. He says that his appetite has improved some. Ensure supplements ordered.  PLTC 102> 77>76>91>now 99K,  H/H 10.6/32.7     Goal of Therapy:  INR 2-3 Monitor platelets by anticoagulation protocol: Yes   Plan:  - coumadin 4 mg po x - INR qAM.  Nicole Cella, RPh Clinical Pharmacist Pager: [phone] 10/04/2014,2:31 PM

## 2014-10-04 NOTE — Progress Notes (Signed)
[facility] PROGRESS NOTE Interim History: 80 y.o. male with past medical history of CK stage III, chronic CHF and history of colon cancer status post colectomy with colostomy. Patient came in to the hospital because of shortness of breath. Patient started to have shortness of breath about 5 days ago, worsening about every day, he also reported generalized weakness to the point he couldn't change his colostomy bag. Patient reported significant weight loss since last year. His wife brought him to the hospital for further evaluation.  In the ED he was found to have creatinine of 2.6, baseline creatinine is 3.6 from March of 2015, his weight is 180 patient weighed 230 lbs in July of 2014.   Filed Weights   09/29/14 0900 10/03/14 0532 10/03/14 1609  Weight: 81 kg (178 lb 9.2 oz) 76 kg (167 lb 8.8 oz) 75 kg (165 lb 5.5 oz)        Intake/Output Summary (Last 24 hours) at 10/04/14 1716 Last data filed at 10/04/14 0855  Gross per 24 hour  Intake    290 ml  Output   1450 ml  Net  -1160 ml     Assessment/Plan: Weakness generalized due to a. fib with slow ventricular response: -Appreciate cardiology on board.  -appreciate EP inputs and recommendations -s/p pacemaker implantation -b-blocker now resumed and titrated up -HR better controlled and device interrogated and working as intended -patient and family with a lot of questions for EP; will wait for them to clarify their needs -home in am  Acute on chronic diastolic HF (heart failure)/Acute respiratory failure: - Continue IV lasix; still with mild fluid overload signs and SOB - Strict I and O's. Monitor electrolytes. NO JVD on physical exam, 1-2+ edema. - Weight on admission 78 kg now 75kg. estimated dry weight 74-75 kg -low sodium diet   CKD (chronic kidney disease) stage 4, GFR 15-29 ml/min -Baseline Cr 2.6-3.0. -cont to hold benazepril for now. -has remained essentially stable; will follow, especially with  diuresis -current Cr 2.89 -lasix change to PO  Essential hypertension -much better -lopressor increased by cardiology recommendations -will continue bidil  Urinary retention and BPH: -required foley placement -continue proscar and flomax -has failed voiding trial -will discharge in am with foley catheter and urology follow up     Code Status: Full code  Family Communication: Plan discussed with the patient; no family at bedside Disposition Plan: home with home health services in 1-2 days (adjusting volume status; still SOB)   Consultants:  cardiology  Procedures: ECHO: estimated ejection fraction was in the range of 40% to 45%. Severe hypokinesis of the basal-midinferolateral, inferior, and inferoseptal myocardium. The study is not technically sufficient to allow evaluation of LV diastolic function.  10/01/14: pacemaker implantation   Antibiotics:  None  HPI/Subjective: No fever. Denies CP. Unable to pee after foley removed this am  Objective: Filed Vitals:   10/04/14 0050 10/04/14 0457 10/04/14 0903 10/04/14 1523  BP: 118/45 125/52 111/56 133/85  Pulse: 80 74 69 73  Temp: 97.7 F (36.5 C) 98.3 F (36.8 C)  97.6 F (36.4 C)  TempSrc: Oral Oral  Oral  Resp: 18 20  18   Height:      Weight:      SpO2: 99% 100%  100%     Exam:  General: AAOX3, in no acute distress; denies CP. Unable to pee after foley removed. HEENT: No bruits, no goiter. -JVD Heart: regular rate; no rubs or gallops; trace LE edema bilaterally Lungs: decrease BS  bibasilar; no wheezing; no frank crackles  Abdomen: Soft, nontender, no guarding; colostomy bag with liquid material in place  Data Reviewed: Basic Metabolic Panel:  Recent Labs Lab 09/27/14 1814  09/30/14 0248 10/01/14 0235 10/02/14 0522 10/03/14 0354 10/04/14 0455  NA  --   < > 139 140 138 141 142  K  --   < > 3.9 4.2 4.7 4.2 4.1  CL  --   < > 95* 97 97 97 97  CO2  --   < > 36* 34* 30 34* 35*  GLUCOSE  --   < > 102*  117* 125* 113* 93  BUN  --   < > 28* 35* 40* 46* 47*  CREATININE  --   < > 2.70* 2.53* 2.54* 2.79* 2.87*  CALCIUM  --   < > 9.6 9.4 9.9 10.1 10.1  MG 2.1  --   --   --   --   --   --   < > = values in this interval not displayed. Liver Function Tests:  Recent Labs Lab 09/28/14 0345  AST 20  ALT 12  ALKPHOS 65  BILITOT 1.1  PROT 6.1  ALBUMIN 3.5   CBC:  Recent Labs Lab 09/30/14 0248 10/01/14 0235 10/02/14 0522 10/03/14 0354 10/04/14 0455  WBC 5.4 5.3 9.8 7.7 7.0  HGB 10.9* 10.7* 11.1* 10.6* 10.6*  HCT 33.6* 32.2* 33.3* 32.5* 32.7*  MCV 99.4 98.8 96.5 97.3 100.3*  PLT 77* 76* 91* 94* 99*   BNP (last 3 results)  Recent Labs  09/27/14 1155  PROBNP 7803.0*    Recent Results (from the past 240 hour(s))  MRSA PCR SCREENING     Status: None   Collection Time    09/29/14  8:55 AM      Result Value Ref Range Status   MRSA by PCR NEGATIVE  NEGATIVE Final   Comment:            The GeneXpert MRSA Assay (FDA     approved for NASAL specimens     only), is one component of a     comprehensive MRSA colonization     surveillance program. It is not     intended to diagnose MRSA     infection nor to guide or     monitor treatment for     MRSA infections.     Studies: No results found.  Scheduled Meds: . feeding supplement (ENSURE COMPLETE)  237 mL Oral Q24H  . feeding supplement (ENSURE)  1 Container Oral Q24H  . finasteride  5 mg Oral Daily  . furosemide  40 mg Oral Daily  . isosorbide-hydrALAZINE  1 tablet Oral BID  . metoprolol succinate  100 mg Oral Daily  . pantoprazole  40 mg Oral Daily  . sodium chloride  3 mL Intravenous Q12H  . tamsulosin  0.4 mg Oral QPC supper  . warfarin  4 mg Oral ONCE-1800  . Warfarin - Pharmacist Dosing Inpatient   Does not apply q1800   Continuous Infusions:   Time: < 30 minutes  Barton Dubois  [facility] Pager [phone]  If 8PM-8AM, please contact night-coverage at [email or web address], password [facility] 10/04/2014, 5:16 PM   LOS: 7 days

## 2014-10-04 NOTE — Progress Notes (Signed)
NUTRITION FOLLOW UP  Intervention:   - Continue Ensure Complete po daily, each supplement provides 350 kcal and 13 grams of protein.  - Continue Ensure Pudding po daily, each supplement provides 170 kcal and 4 grams of protein.  - RD to continue to follow nutrition care plan.  Nutrition Dx:   Inadequate oral intake related to poor appetite as evidenced by patient report and ongoing significant weight loss; ongoing  Goal:   Pt to meet >/= 90% of their estimated nutrition needs; not met  Monitor:   Weight trends, labs, I/O's, PO intake  Assessment:   PMHx significant for CKD stage III, CHF, colon CA s/p colectomy and colostomy. Admitted with SOB x 5 days, weakness and ongoing weight loss. Work-up reveals CHF exacerbation.  10/3: Pt weighed approximately 230 lb one year ago. Currently down to 170 lb, however continues to have significant fluid, question actual dry weight.This is a total weight loss of 60 lb, or 26% x 1 year and is significant for this time frame. Pt confirms ongoing weight loss. He reports that his appetite is not what it used to be. Eats a bowl of cereal in the morning, has a pack of peanut butter crackers for lunch, and then eats a portion of whatever his wife has made for dinner. He states that his appetite is presently fair, and meal intake records report he is eating 25% of his meals. He states that he follows a 32-oz fluid restriction at home. Has not tried Ensure or Boost before, and is willing to try it now. Pt states that he feels so weak these days.  10/9: - Pt s/p pacemake implantation on 10/6. - Pt meal intake is varied from 10-100%. He says that his appetite has improved some. He likes the Ensure Complete supplements. His wife said that they would purchase them once they went home. - Pt with stage II pressure ulcer on sacrum.   Labs: Na and K WNL BUN elevated  Height: Ht Readings from Last 1 Encounters:  10/03/14 $RemoveB'5\' 10"'apfOeDkw$  (1.778 m)    Weight Status:   Wt  Readings from Last 1 Encounters:  10/03/14 165 lb 5.5 oz (75 kg)    Re-estimated needs:  Kcal: 9371-6967 Protein: 75-85 g Fluid: 1.9-2.1 L  Skin: stage II to R buttocks  Diet Order: Cardiac   Intake/Output Summary (Last 24 hours) at 10/04/14 1340 Last data filed at 10/04/14 0855  Gross per 24 hour  Intake    410 ml  Output   1450 ml  Net  -1040 ml    Last BM: colostomy   Labs:   Recent Labs Lab 09/27/14 1814  10/02/14 0522 10/03/14 0354 10/04/14 0455  NA  --   < > 138 141 142  K  --   < > 4.7 4.2 4.1  CL  --   < > 97 97 97  CO2  --   < > 30 34* 35*  BUN  --   < > 40* 46* 47*  CREATININE  --   < > 2.54* 2.79* 2.87*  CALCIUM  --   < > 9.9 10.1 10.1  MG 2.1  --   --   --   --   GLUCOSE  --   < > 125* 113* 93  < > = values in this interval not displayed.  CBG (last 3)  No results found for this basename: GLUCAP,  in the last 72 hours  Scheduled Meds: . feeding supplement (ENSURE COMPLETE)  237 mL Oral Q24H  . feeding supplement (ENSURE)  1 Container Oral Q24H  . finasteride  5 mg Oral Daily  . furosemide  40 mg Oral Daily  . isosorbide-hydrALAZINE  1 tablet Oral BID  . metoprolol succinate  100 mg Oral Daily  . pantoprazole  40 mg Oral Daily  . sodium chloride  3 mL Intravenous Q12H  . tamsulosin  0.4 mg Oral QPC supper  . Warfarin - Pharmacist Dosing Inpatient   Does not apply q1800    Continuous Infusions:   Laurette Schimke RD, LDN

## 2014-10-04 NOTE — Progress Notes (Signed)
Physical Therapy Treatment Patient Details Name: Nicholas [name] MRN: [id number] DOB: 1934/12/08 Today's Date: 10/04/2014    History of Present Illness Pt is a 80 y.o. male with past medical history of CK stage III, chronic CHF and history of colon cancer status post colectomy with colostomy. Patient came in to the hospital because of SOB. Patient started to have SOB about 5 days ago, worsening about every day, he also reported generalized weakness to the point he couldn't change his colostomy bag. Patient reported significant weight loss since last year. His wife brought him to the hospital for further evaluation. SP implantation of [employer]ure Quadra RFBiV PPM for complete HB    PT Comments    Pt making steady progress with mobility. Entire session performed on room air with oxygen levels >/= to 92%. HR max was 126 bpm with gait in hallway. Pt reported minimal to no shortness of breath and no dizziness. Pt left in chair with spouse in room.  Follow Up Recommendations  Home health PT;Supervision for mobility/OOB     Equipment Recommendations  Other (comment) (has RW, may want to consider rollator)       Precautions / Restrictions Precautions Precautions: Fall Precaution Comments: Fall risk greatly reduced with use of RW Restrictions Weight Bearing Restrictions: No    Mobility  Bed Mobility Overal bed mobility: Needs Assistance Bed Mobility: Supine to Sit     Supine to sit: Min assist     General bed mobility comments: with bed flat and no rails used- min assist to complete trunk transition into seated position at edge of bed. once edge of bed pt able to scoot self out.  Transfers Overall transfer level: Needs assistance Equipment used: Rolling walker (2 wheeled) Transfers: Sit to/from Stand Sit to Stand: Supervision         General transfer comment: pt cues for hand placement for safety with transfers  Ambulation/Gait Ambulation/Gait assistance:  Supervision Ambulation Distance (Feet): 150 Feet Assistive device: Rolling walker (2 wheeled) Gait Pattern/deviations: Step-through pattern;Trunk flexed;Narrow base of support Gait velocity: Decreased Gait velocity interpretation: Below normal speed for age/gender General Gait Details: cues on posture and walker position. Gait performed without oxygen with safe O2 levels.   Stairs            Wheelchair Mobility    Modified Rankin (Stroke Patients Only)          Cognition Arousal/Alertness: Awake/alert Behavior During Therapy: WFL for tasks assessed/performed Overall Cognitive Status: Within Functional Limits for tasks assessed                       Pertinent Vitals/Pain Pain Assessment: No/denies pain           PT Goals (current goals can now be found in the care plan section) Acute Rehab PT Goals Patient Stated Goal: To return home with wife PT Goal Formulation: With patient Time For Goal Achievement: 10/05/14 Potential to Achieve Goals: Good Progress towards PT goals: Progressing toward goals    Frequency  Min 3X/week    PT Plan Current plan remains appropriate       End of Session Equipment Utilized During Treatment: Gait belt Activity Tolerance: Patient tolerated treatment well Patient left: in chair;with call bell/phone within reach;with family/visitor present     Time: 4098-1191 PT Time Calculation (min): 24 min  Charges:  $Gait Training: 8-22 mins $Therapeutic Activity: 8-22 mins  G Codes:      Willow Ora 10/04/2014, 9:30 AM

## 2014-10-05 LAB — BASIC METABOLIC PANEL
ANION GAP: 9 (ref 5–15)
BUN: 50 mg/dL — ABNORMAL HIGH (ref 6–23)
CALCIUM: 9.8 mg/dL (ref 8.4–10.5)
CHLORIDE: 98 meq/L (ref 96–112)
CO2: 33 meq/L — AB (ref 19–32)
Creatinine, Ser: 2.8 mg/dL — ABNORMAL HIGH (ref 0.50–1.35)
GFR calc Af Amer: 23 mL/min — ABNORMAL LOW (ref >90)
GFR calc non Af Amer: 20 mL/min — ABNORMAL LOW (ref >90)
Glucose, Bld: 100 mg/dL — ABNORMAL HIGH (ref 70–99)
POTASSIUM: 4.6 meq/L (ref 3.7–5.3)
SODIUM: 140 meq/L (ref 137–147)

## 2014-10-05 LAB — CBC
HEMATOCRIT: 32 % — AB (ref 39.0–52.0)
Hemoglobin: 10.5 g/dL — ABNORMAL LOW (ref 13.0–17.0)
MCH: 32.5 pg (ref 26.0–34.0)
MCHC: 32.8 g/dL (ref 30.0–36.0)
MCV: 99.1 fL (ref 78.0–100.0)
Platelets: 105 10*3/uL — ABNORMAL LOW (ref 150–400)
RBC: 3.23 MIL/uL — ABNORMAL LOW (ref 4.22–5.81)
RDW: 14 % (ref 11.5–15.5)
WBC: 6.3 10*3/uL (ref 4.0–10.5)

## 2014-10-05 LAB — PROTIME-INR
INR: 1.83 — AB (ref 0.00–1.49)
Prothrombin Time: 21.2 seconds — ABNORMAL HIGH (ref 11.6–15.2)

## 2014-10-05 MED ORDER — METOPROLOL SUCCINATE ER 100 MG PO TB24: 100.0000 mg | ORAL_TABLET | Freq: Every day | ORAL | Status: DC

## 2014-10-05 MED ORDER — FUROSEMIDE 40 MG PO TABS: 40.0000 mg | ORAL_TABLET | Freq: Every day | ORAL | Status: AC

## 2014-10-05 MED ORDER — TAMSULOSIN HCL 0.4 MG PO CAPS: 0.4000 mg | ORAL_CAPSULE | Freq: Every day | ORAL | Status: AC

## 2014-10-05 MED ORDER — ENSURE COMPLETE PO LIQD: 237.0000 mL | ORAL | Status: AC

## 2014-10-05 MED ORDER — ISOSORB DINITRATE-HYDRALAZINE 20-37.5 MG PO TABS: 1.0000 | ORAL_TABLET | Freq: Two times a day (BID) | ORAL | Status: DC

## 2014-10-05 NOTE — Discharge Summary (Signed)
Physician Discharge Summary  DAYVION SANS XMI:[id number] DOB: Jan 28, 1934 DOA: 09/27/2014  PCP: Leonard Downing, MD  Admit date: 09/27/2014 Discharge date: 10/05/2014  Time spent: >30 minutes  Recommendations for Outpatient Follow-up:  Check BMET to follow electrolytes and renal function Reassess BP and adjust medications as needed If BP able to tolerate it and Cr remains stable, please add low dose ARB/ACE given EF  Discharge Diagnoses:  Principal Problem:   Acute on chronic diastolic HF (heart failure) Active Problems:   Essential hypertension   Atrial fibrillation with slow ventricular response   Weakness generalized   CKD (chronic kidney disease) stage 4, GFR 15-29 ml/min   Discharge Condition: stable and improved. Discharge home with [facility] services.  Diet recommendation: low sodium diet  Filed Weights   10/03/14 0532 10/03/14 1609 10/05/14 0647  Weight: 76 kg (167 lb 8.8 oz) 75 kg (165 lb 5.5 oz) 74.118 kg (163 lb 6.4 oz)    History of present illness:  80 y.o. male with past medical history of CK stage III, chronic CHF and history of colon cancer status post colectomy with colostomy. Patient came in to the hospital because of shortness of breath. Patient started to have shortness of breath about 5 days ago, worsening about every day, he also reported generalized weakness to the point he couldn't change his colostomy bag. Patient reported significant weight loss since last year. His wife brought him to the hospital for further evaluation.    Hospital Course:  Weakness generalized due to a. fib with slow ventricular response:  -appreciate EP inputs and recommendations; s/p pacemaker implantation  -b-blocker now resumed and titrated up  -HR better controlled and device interrogated and working as intended  -following PT rec's, will arrange for [facility] services  Acute on chronic diastolic HF (heart failure)/Acute respiratory failure:  - Continue lasix 40mg  daily now; no SOB,  no crackles and just mild LE edema - Monitor electrolytes and renal funciton. NO JVD on physical exam  - Weight on admission 78 kg now 74kg; which is his estimated dry weight 74-75 kg  -instructed to follow a low sodium diet and watch fluid intake  CKD (chronic kidney disease) stage 4, GFR 15-29 ml/min  -Baseline Cr 2.6-3.0.  -cont to hold benazepril for now (will follow renal function further to guaranteed stability before decision of starting low dose ARB/ACE).  -has remained essentially stable; will follow -current Cr at discharge 2.8   Essential hypertension  -well controlled with current regimen  -lopressor increased by cardiology recommendations (100mg  daily) -will continue bidil  -no calcium channel blockers; and holding ACE/ARB due to renal failure  Urinary retention and BPH:  -required foley placement  -continue proscar and flomax  -will discharge with foley catheter and urology follow up    Procedures: ECHO: estimated ejection fraction was in the range of 40% to 45%. Severe hypokinesis of the basal-midinferolateral, inferior, and inferoseptal myocardium. The study is not technically sufficient to allow evaluation of LV diastolic function.   10/01/14: pacemaker implantation    Consultations:  Cardiology (EP)  Urology (Dr. Shona Needles curbside for urinary retention; recommended discharge with foley and follow in 5-7 days at office)  Discharge Exam: Filed Vitals:   10/05/14 1101  BP: 119/40  Pulse: 69  Temp:   Resp:    General: AAOX3, in no acute distress; denies CP. Unable to pee after foley removed.  HEENT: No bruits, no goiter. -JVD  Heart: regular rate; no rubs or gallops; trace LE edema bilaterally  Lungs:  decrease BS bibasilar; no wheezing; no frank crackles  Abdomen: Soft, nontender, no guarding; colostomy bag with liquid material in place  Discharge Instructions You were cared for by a hospitalist during your hospital stay. If you have any questions about  your discharge medications or the care you received while you were in the hospital after you are discharged, you can call the unit and asked to speak with the hospitalist on call if the hospitalist that took care of you is not available. Once you are discharged, your primary care physician will handle any further medical issues. Please note that NO REFILLS for any discharge medications will be authorized once you are discharged, as it is imperative that you return to your primary care physician (or establish a relationship with a primary care physician if you do not have one) for your aftercare needs so that they can reassess your need for medications and monitor your lab values.  Discharge Instructions   Diet - low sodium heart healthy    Complete by:  As directed      Discharge instructions    Complete by:  As directed   Take medications as prescribed Follow a low sodium diet (less than 2000mg  daily) Follow with urology in 5 days (call office for appointment details) Follow with PCP in 10 days Follow with cardiology as instructed Check weight on daily basis          Current Discharge Medication List    START taking these medications   Details  feeding supplement, ENSURE COMPLETE, (ENSURE COMPLETE) LIQD Take 237 mLs by mouth daily.    isosorbide-hydrALAZINE (BIDIL) 20-37.5 MG per tablet Take 1 tablet by mouth 2 (two) times daily. Qty: 60 tablet, Refills: 1    tamsulosin (FLOMAX) 0.4 MG CAPS capsule Take 1 capsule (0.4 mg total) by mouth daily after supper. Qty: 30 capsule, Refills: 1      CONTINUE these medications which have CHANGED   Details  furosemide (LASIX) 40 MG tablet Take 1 tablet (40 mg total) by mouth daily.    metoprolol succinate (TOPROL-XL) 100 MG 24 hr tablet Take 1 tablet (100 mg total) by mouth daily. Qty: 30 tablet, Refills: 1      CONTINUE these medications which have NOT CHANGED   Details  Calcium Carbonate-Vitamin D (CALCIUM 600-D) 600-400 MG-UNIT per  tablet Take 1 tablet by mouth 2 (two) times daily.     ferrous sulfate 325 (65 FE) MG tablet Take 325 mg by mouth 2 (two) times daily with a meal.    Associated Diagnoses: Anemia, unspecified    finasteride (PROSCAR) 5 MG tablet Take 5 mg by mouth daily.      nitroGLYCERIN (NITROSTAT) 0.4 MG SL tablet Place 1 tablet (0.4 mg total) under the tongue every 5 (five) minutes as needed. For chest pain Qty: 25 tablet, Refills: 6    pantoprazole (PROTONIX) 40 MG tablet Take 1 tablet (40 mg total) by mouth daily. Qty: 30 tablet, Refills: 2    simvastatin (ZOCOR) 20 MG tablet Take 1 tablet (20 mg total) by mouth at bedtime. Qty: 90 tablet, Refills: 3    vitamin C (ASCORBIC ACID) 500 MG tablet Take 500 mg by mouth daily.      Vitamins-Lipotropics (B-100 COMPLEX) TABS Take 1 tablet by mouth daily.     warfarin (COUMADIN) 3 MG tablet Take 3-4.5 mg by mouth daily. 4.5mg  on Wednesdays all other days take 3mg       STOP taking these medications  amLODipine-benazepril (LOTREL) 5-20 MG per capsule      amoxicillin-clavulanate (AUGMENTIN) 875-125 MG per tablet      doxazosin (CARDURA) 8 MG tablet      Saw Palmetto, Serenoa repens, (SAW PALMETTO PO)        Allergies  Allergen Reactions  . Cephalexin Other (See Comments)    "too strong for my system" - per office note 05/05/2012  . Ciprofloxacin Other (See Comments)    Reaction unknown  . Hctz [Hydrochlorothiazide]     DIZZINESS   . Levofloxacin Other (See Comments)    "too strong for my system" - per office note 05/05/2012  . Lipitor [Atorvastatin Calcium]     MUSCLE PAIN  . Niaspan [Niacin Er]     FLUSHING   . Oxycodone-Acetaminophen Other (See Comments)    Reaction unknown  . Sulfonamide Derivatives Other (See Comments)    Reaction unkonwn   Follow-up Information   Follow up with [facility]. (10/10/14 at 12pm for wound check in pacemaker clinic)    Specialty:  Cardiology   Contact information:   [address], [place] [address] [phone]      Follow up with [facility]. (Registered Nurse and Physical Therapy to start within 24-48 hours of hospital discharge)    Contact information:   [address] [place] [address] [phone]       Call [facility]. (office to have appointment details in 1 week; for urinary retention, voiding trials and BPH)    Contact information:   [address] [place] [address] [phone]      Follow up with Leonard Downing, MD In 10 days.   Specialty:  Family Medicine   Contact information:   [place] [address] [phone]       The results of significant diagnostics from this hospitalization (including imaging, microbiology, ancillary and laboratory) are listed below for reference.    Significant Diagnostic Studies: Ct Abdomen Pelvis Wo Contrast  09/27/2014   CLINICAL DATA:  Weight loss. History of rectal and colon cancer post colostomy.  EXAM: CT ABDOMEN AND PELVIS WITHOUT CONTRAST  TECHNIQUE: Multidetector CT imaging of the abdomen and pelvis was performed following the standard protocol without IV contrast.  COMPARISON:  11/20/2013  FINDINGS: Technically limited study due to motion artifact and streak artifact.  Bilateral pleural effusions, greater on the right. Atelectasis in the lung bases. Postoperative changes in the mediastinum. Calcification of the native Coronary arteries.  The unenhanced appearance of the liver, spleen, gallbladder, pancreas, adrenal glands, inferior vena cava, and retroperitoneal lymph nodes is unremarkable. Extensive calcification throughout the abdominal aorta without aneurysm. Bilateral renal atrophy, greater on the right. Low-attenuation mass lesions are arising from the upper pole of the left kidney likely representing cysts. No significant change in size since previous study. No hydronephrosis or hydroureter. Stomach,  small bowel, and colon are not abnormally distended. Sigmoid colonic resection with left lower quadrant colostomy. There is of peristomal hernia containing small bowel and colon. There is an additional ventral anterior abdominal wall hernia containing colon. Contrast material passes through this area suggesting no evidence of obstruction. No free air or free fluid in the abdomen.  Pelvis: Postoperative changes in the pelvis with rectal resection. Soft tissue thickening and infiltration in the presacral space appears unchanged since previous study, likely postoperative. Small amount of free fluid in the pelvis is nonspecific. Prostate gland is enlarged. Bladder  wall is not thickened. Spondylolysis with mild spondylolisthesis at L5-S1. Diffuse degenerative changes throughout the cervical spine. No destructive bone lesions appreciated.  IMPRESSION: Surgical resection of the rectosigmoid colon with left lower quadrant colostomy. Peristomal and anterior abdominal wall hernias containing colon and small bowel without evidence of obstruction. Small amount of free fluid in the pelvis is nonspecific. Bilateral renal atrophy with probable left renal cysts. Bilateral pleural effusions with basilar atelectasis.   Electronically Signed   By: Lucienne Capers M.D.   On: 09/27/2014 23:36   Dg Chest 2 View  09/25/2014   CLINICAL DATA:  Difficulty breathing  EXAM: CHEST  2 VIEW  COMPARISON:  November 20, 2013  FINDINGS: There is underlying emphysema. There is a rather minimal right pleural effusion. Elsewhere lungs are clear. Heart is mildly enlarged with pulmonary vascularity within normal limits. No adenopathy. Patient is status post coronary artery bypass grafting. Patient is status post left total shoulder replacement.  IMPRESSION: Rather minimal right pleural effusion. Lungs otherwise clear. Underlying emphysema. Mild cardiac enlargement, stable.   Electronically Signed   By: Lowella Grip M.D.   On: 09/25/2014 13:48    Ct Head Wo Contrast  09/27/2014   CLINICAL DATA:  Increasing weakness and fatigue over the past several weeks ; right arm has a heavy feeling; history of CHF and MI and coronary artery bypass grafting  EXAM: CT HEAD WITHOUT CONTRAST  TECHNIQUE: Contiguous axial images were obtained from the base of the skull through the vertex without intravenous contrast.  COMPARISON:  None.  FINDINGS: There is mild age appropriate diffuse cerebral and cerebellar atrophy. The ventricles are normal in size and position. There is no intracranial hemorrhage nor intracranial mass effect. There is decreased density in the deep white matter of both cerebral hemispheres consistent with chronic small vessel ischemia.  There is postsurgical change associated with the right maxillary sinus with thickening of the lateral sinus wall. No air-fluid levels are demonstrated. The calvarium is intact.  IMPRESSION: 1. There is no acute intracranial hemorrhage nor evidence of acute ischemic change. 2. There are changes of chronic small vessel ischemia in the deep white matter of both cerebral hemispheres. There is also mild age appropriate diffuse atrophy. 3. There is no acute abnormality of the calvarium.   Electronically Signed   By: David  Martinique   On: 09/27/2014 13:37   Dg Chest Port 1 View  10/02/2014   CLINICAL DATA:  Initial encounter for pacemaker placement  EXAM: PORTABLE CHEST - 1 VIEW  COMPARISON:  09/27/2014.  FINDINGS: 0818 hrs. The cardio pericardial silhouette is enlarged. Pulmonary vascular congestion noted with central in basilar interstitial and airspace disease suggesting edema. Probable small layering pleural effusions. Left-sided dual lead permanent pacemaker is new in the interval. No evidence for pneumothorax. The cardio pericardial silhouette is enlarged. Telemetry leads overlie the chest.  IMPRESSION: New left-sided permanent pacemaker without evidence for pneumothorax.  Interval development of interstitial in basilar  airspace disease suggesting edema.   Electronically Signed   By: Misty Stanley M.D.   On: 10/02/2014 08:03   Dg Chest Port 1 View  09/27/2014   CLINICAL DATA:  Weakness for several weeks. Progressive dyspnea. Progressive shortness of breath.  EXAM: PORTABLE CHEST - 1 VIEW  COMPARISON:  09/25/2014.  FINDINGS: Lower lung volumes with cardiomegaly. CABG. Monitoring leads project over the chest. Small RIGHT pleural effusion. Pulmonary vascular congestion. LEFT shoulder hemiarthroplasty it RIGHT shoulder osteoarthritis.  IMPRESSION: Mild CHF with small RIGHT pleural effusion.  Electronically Signed   By: Dereck Ligas M.D.   On: 09/27/2014 12:15    Microbiology: Recent Results (from the past 240 hour(s))  MRSA PCR SCREENING     Status: None   Collection Time    09/29/14  8:55 AM      Result Value Ref Range Status   MRSA by PCR NEGATIVE  NEGATIVE Final   Comment:            The GeneXpert MRSA Assay (FDA     approved for NASAL specimens     only), is one component of a     comprehensive MRSA colonization     surveillance program. It is not     intended to diagnose MRSA     infection nor to guide or     monitor treatment for     MRSA infections.     Labs: Basic Metabolic Panel:  Recent Labs Lab 10/01/14 0235 10/02/14 0522 10/03/14 0354 10/04/14 0455 10/05/14 0313  NA 140 138 141 142 140  K 4.2 4.7 4.2 4.1 4.6  CL 97 97 97 97 98  CO2 34* 30 34* 35* 33*  GLUCOSE 117* 125* 113* 93 100*  BUN 35* 40* 46* 47* 50*  CREATININE 2.53* 2.54* 2.79* 2.87* 2.80*  CALCIUM 9.4 9.9 10.1 10.1 9.8   CBC:  Recent Labs Lab 10/01/14 0235 10/02/14 0522 10/03/14 0354 10/04/14 0455 10/05/14 0313  WBC 5.3 9.8 7.7 7.0 6.3  HGB 10.7* 11.1* 10.6* 10.6* 10.5*  HCT 32.2* 33.3* 32.5* 32.7* 32.0*  MCV 98.8 96.5 97.3 100.3* 99.1  PLT 76* 91* 94* 99* 105*   BNP (last 3 results)  Recent Labs  09/27/14 1155  PROBNP 7803.0*    Signed:  Barton Dubois  [facility] 10/05/2014, 11:29  AM

## 2014-10-05 NOTE — Progress Notes (Signed)
DC IV, DC Tele, DC Home. Discharge instructions and home medications discussed with patient and patient's wife. Patient and patient's wife denied any questions or concerns at this time. Patient leaving unit via wheelchair and appears in no acute distress. Ok to discharge patient with foley. Patient belongings and equipment including front wheel walker was given to patient.

## 2014-10-10 ENCOUNTER — Ambulatory Visit (INDEPENDENT_AMBULATORY_CARE_PROVIDER_SITE_OTHER): Payer: Medicare Other | Admitting: *Deleted

## 2014-10-10 ENCOUNTER — Ambulatory Visit (INDEPENDENT_AMBULATORY_CARE_PROVIDER_SITE_OTHER): Payer: Medicare Other | Admitting: Pharmacist

## 2014-10-10 DIAGNOSIS — I4891 Unspecified atrial fibrillation: Secondary | ICD-10-CM

## 2014-10-10 DIAGNOSIS — Z5181 Encounter for therapeutic drug level monitoring: Secondary | ICD-10-CM

## 2014-10-10 DIAGNOSIS — I5033 Acute on chronic diastolic (congestive) heart failure: Secondary | ICD-10-CM

## 2014-10-10 LAB — MDC_IDC_ENUM_SESS_TYPE_INCLINIC
Brady Statistic RA Percent Paced: 0 %
Brady Statistic RV Percent Paced: 66 %
Implantable Pulse Generator Model: 3242
Lead Channel Impedance Value: 475 Ohm
Lead Channel Impedance Value: 662.5 Ohm
Lead Channel Pacing Threshold Amplitude: 0.5 V
Lead Channel Pacing Threshold Amplitude: 1.25 V
Lead Channel Pacing Threshold Amplitude: 1.25 V
Lead Channel Pacing Threshold Pulse Width: 0.5 ms
Lead Channel Pacing Threshold Pulse Width: 0.5 ms
Lead Channel Pacing Threshold Pulse Width: 0.5 ms
Lead Channel Setting Pacing Amplitude: 3.5 V
Lead Channel Setting Pacing Pulse Width: 0.5 ms
Lead Channel Setting Pacing Pulse Width: 0.5 ms
Lead Channel Setting Sensing Sensitivity: 2 mV
MDC IDC MSMT BATTERY REMAINING LONGEVITY: 67.2 mo
MDC IDC MSMT BATTERY VOLTAGE: 2.98 V
MDC IDC MSMT LEADCHNL RV PACING THRESHOLD AMPLITUDE: 0.5 V
MDC IDC MSMT LEADCHNL RV PACING THRESHOLD PULSEWIDTH: 0.5 ms
MDC IDC MSMT LEADCHNL RV SENSING INTR AMPL: 12 mV
MDC IDC PG SERIAL: 7667871
MDC IDC SESS DTM: 20151015155013
MDC IDC SET LEADCHNL LV PACING AMPLITUDE: 3.5 V

## 2014-10-10 LAB — POCT INR: INR: 1.6

## 2014-10-10 NOTE — Progress Notes (Signed)
Wound check appointment. Steri-strips removed. Wound without redness or edema. Incision edges approximated, wound well healed. Normal device function. Thresholds, sensing, and impedances consistent with implant measurements. Device programmed at 3.5V/auto capture programmed on for extra safety margin until 3 month visit. Histogram distribution blunted, rate response on today.  Bi V pacing 66%.  No high ventricular rates noted. Patient educated about wound care, arm mobility, lifting restrictions. ROV in 3 months with implanting physician.

## 2014-10-15 ENCOUNTER — Other Ambulatory Visit: Payer: Self-pay | Admitting: Cardiology

## 2014-10-15 ENCOUNTER — Encounter: Payer: Self-pay | Admitting: Cardiology

## 2014-10-17 ENCOUNTER — Ambulatory Visit: Payer: Medicare Other | Admitting: Nurse Practitioner

## 2014-10-22 ENCOUNTER — Ambulatory Visit (INDEPENDENT_AMBULATORY_CARE_PROVIDER_SITE_OTHER): Payer: Medicare Other

## 2014-10-22 DIAGNOSIS — I4891 Unspecified atrial fibrillation: Secondary | ICD-10-CM

## 2014-10-22 DIAGNOSIS — Z5181 Encounter for therapeutic drug level monitoring: Secondary | ICD-10-CM

## 2014-10-22 LAB — POCT INR: INR: 2

## 2014-10-23 ENCOUNTER — Other Ambulatory Visit (HOSPITAL_BASED_OUTPATIENT_CLINIC_OR_DEPARTMENT_OTHER): Payer: Medicare Other

## 2014-10-23 ENCOUNTER — Ambulatory Visit (HOSPITAL_BASED_OUTPATIENT_CLINIC_OR_DEPARTMENT_OTHER): Payer: Medicare Other | Admitting: Nurse Practitioner

## 2014-10-23 ENCOUNTER — Telehealth: Payer: Self-pay | Admitting: Nurse Practitioner

## 2014-10-23 ENCOUNTER — Ambulatory Visit: Payer: Medicare Other

## 2014-10-23 VITALS — BP 133/56 | HR 51 | Temp 97.5°F | Resp 16 | Ht 70.0 in | Wt 157.0 lb

## 2014-10-23 DIAGNOSIS — C20 Malignant neoplasm of rectum: Secondary | ICD-10-CM

## 2014-10-23 DIAGNOSIS — D649 Anemia, unspecified: Secondary | ICD-10-CM

## 2014-10-23 DIAGNOSIS — N289 Disorder of kidney and ureter, unspecified: Secondary | ICD-10-CM

## 2014-10-23 DIAGNOSIS — Z85048 Personal history of other malignant neoplasm of rectum, rectosigmoid junction, and anus: Secondary | ICD-10-CM

## 2014-10-23 DIAGNOSIS — N189 Chronic kidney disease, unspecified: Secondary | ICD-10-CM

## 2014-10-23 DIAGNOSIS — D631 Anemia in chronic kidney disease: Secondary | ICD-10-CM

## 2014-10-23 DIAGNOSIS — D696 Thrombocytopenia, unspecified: Secondary | ICD-10-CM

## 2014-10-23 DIAGNOSIS — N179 Acute kidney failure, unspecified: Secondary | ICD-10-CM

## 2014-10-23 LAB — CBC WITH DIFFERENTIAL/PLATELET
BASO%: 0.5 % (ref 0.0–2.0)
BASOS ABS: 0 10*3/uL (ref 0.0–0.1)
EOS%: 3 % (ref 0.0–7.0)
Eosinophils Absolute: 0.2 10*3/uL (ref 0.0–0.5)
HEMATOCRIT: 36 % — AB (ref 38.4–49.9)
HGB: 11.7 g/dL — ABNORMAL LOW (ref 13.0–17.1)
LYMPH%: 13.9 % — ABNORMAL LOW (ref 14.0–49.0)
MCH: 32 pg (ref 27.2–33.4)
MCHC: 32.5 g/dL (ref 32.0–36.0)
MCV: 98.4 fL — ABNORMAL HIGH (ref 79.3–98.0)
MONO#: 0.5 10*3/uL (ref 0.1–0.9)
MONO%: 7.7 % (ref 0.0–14.0)
NEUT#: 4.8 10*3/uL (ref 1.5–6.5)
NEUT%: 74.9 % (ref 39.0–75.0)
Platelets: 121 10*3/uL — ABNORMAL LOW (ref 140–400)
RBC: 3.66 10*6/uL — ABNORMAL LOW (ref 4.20–5.82)
RDW: 14.4 % (ref 11.0–14.6)
WBC: 6.4 10*3/uL (ref 4.0–10.3)
lymph#: 0.9 10*3/uL (ref 0.9–3.3)

## 2014-10-23 LAB — IRON AND TIBC CHCC
%SAT: 41 % (ref 20–55)
Iron: 95 ug/dL (ref 42–163)
TIBC: 230 ug/dL (ref 202–409)
UIBC: 135 ug/dL (ref 117–376)

## 2014-10-23 LAB — FERRITIN CHCC: FERRITIN: 651 ng/mL — AB (ref 22–316)

## 2014-10-23 NOTE — Progress Notes (Signed)
  [facility] OFFICE PROGRESS NOTE   Diagnosis:  Anemia secondary to renal failure, history of rectal cancer  INTERVAL HISTORY:   He returns as scheduled. He continues every 4 week erythropoietin. He was hospitalized earlier this month and reports undergoing placement of a pacemaker. He reports a poor energy level and poor appetite. He continues to lose weight. He reports significant leg swelling prior to the hospitalization and thinks diuresis may account for some of the weight loss. Colostomy output is "normal". No nausea or vomiting. He has occasional mild dysphagia. He has mild dyspnea on exertion. He has an occasional dry cough. No fever.  Objective:  Vital signs in last 24 hours:  Blood pressure 133/56, pulse 51, temperature 97.5 F (36.4 C), temperature source Oral, resp. rate 16, height 5\' 10"  (1.778 m), weight 157 lb (71.215 kg), SpO2 99.00%.    HEENT: No thrush or ulcers. Lymphatics: No palpable cervical, supraclavicular or inguinal lymph nodes. Soft mobile 1/2-1 cm bilateral axillary lymph nodes. Resp: Lungs clear bilaterally. Cardio: Irregular. GI: Abdomen soft and nontender. No organomegaly. Left lower quadrant colostomy. Vascular: No leg edema.    Lab Results:  Lab Results  Component Value Date   WBC 6.4 10/23/2014   HGB 11.7* 10/23/2014   HCT 36.0* 10/23/2014   MCV 98.4* 10/23/2014   PLT 121* 10/23/2014   NEUTROABS 4.8 10/23/2014    Imaging:  No results found.  Medications: I have reviewed the patient's current medications.  Assessment/Plan: 1. Rectal cancer, stage II, T3 N0, status post neoadjuvant infusional 5-fluorouracil and radiation, status post APR 06/01/2007. He completed 4 months of adjuvant Xeloda. Colonoscopy June 2012-negative, repeat planned for 5 years 2. Anemia. Likely related to renal failure versus myelodysplasia. Improved with erythropoietin 3. Admission with pyelonephritis 11/20/2013. 4. Chronic renal failure. 5. Mild  thrombocytopenia-? Myelodysplasia,? ITP-stable 6. Anorexia/weight loss. Question etiology.   Disposition: [name] receives erythropoietin on an every 4 week schedule. Hemoglobin today is 11.7. We will hold today's injection.  He has lost a significant amount of weight over the past 4 months. He is experiencing anorexia. Etiology unclear.  He will return for labs and a followup visit in 3 weeks with a repeat weight.  Plan reviewed with Dr. Benay Spice.  Ned Card ANP/GNP-BC   10/23/2014  10:39 AM

## 2014-10-23 NOTE — Telephone Encounter (Signed)
Gave avs & cal for Nov/Dec. °

## 2014-10-23 NOTE — Progress Notes (Signed)
Hbg: 11.7 today.  No procrit given.  Pt saw Ned Card, NP today

## 2014-10-24 LAB — CEA: CEA: 0.8 ng/mL (ref 0.0–5.0)

## 2014-10-29 ENCOUNTER — Telehealth: Payer: Self-pay

## 2014-10-29 ENCOUNTER — Encounter: Payer: Self-pay | Admitting: Internal Medicine

## 2014-10-29 ENCOUNTER — Ambulatory Visit (INDEPENDENT_AMBULATORY_CARE_PROVIDER_SITE_OTHER): Payer: Medicare Other | Admitting: Internal Medicine

## 2014-10-29 VITALS — BP 90/60 | HR 64 | Ht 70.0 in | Wt 155.0 lb

## 2014-10-29 DIAGNOSIS — R634 Abnormal weight loss: Secondary | ICD-10-CM

## 2014-10-29 DIAGNOSIS — R1314 Dysphagia, pharyngoesophageal phase: Secondary | ICD-10-CM

## 2014-10-29 DIAGNOSIS — R630 Anorexia: Secondary | ICD-10-CM

## 2014-10-29 DIAGNOSIS — Z7901 Long term (current) use of anticoagulants: Secondary | ICD-10-CM

## 2014-10-29 NOTE — Telephone Encounter (Signed)
I have spoken to patient he has already been made aware that he may stop warfarin 5 days prior to procedure but will need lovenox bridging prior to procedure. He verbalized understanding.

## 2014-10-29 NOTE — Telephone Encounter (Signed)
He should stop warfarin for 5 days before procedure and have coumadin clinic arrange lovenox bridging.

## 2014-10-29 NOTE — Progress Notes (Signed)
   Subjective:    Patient ID: Nicholas [name], male    DOB: March 13, 1934, 80 y.o.   MRN: [id number]  HPI   Patient is a former patient of Dr. Tomie China with a history of rectal cancer and colostomy, and chronic GERD. He also has a chronic recurrent heme positive stool related to bleeding from his ostomy, and a chronic anemia, he is being seen in hematology clinic. Having chronic solid dysphagia - and more rare intermittent liquid dysphagia with nasal regurgitation + weight loss -  + anorexia Has lost edema with cardiac tx Has gone from 40 waist to 36 waist over past year His wife participates in the history today. Medications, allergies, past medical history, past surgical history, family history and social history are reviewed and updated in the EMR.  Review of Systems As per HPI also, he had a pacemaker placed more recently. He is been losing more weight since then.sounds like he's been diuresing and losing edema. Still feels very fatigued, he has a malfunctioning right shoulder.    Objective:   Physical Exam General:  Elderly wmand in no acute distress Eyes:  anicteric. ENT:   Mouth and posterior pharynx free of lesions. + dentures  Neck:   supple w/o thyromegaly or mass.  Lungs: Clear to auscultation bilaterally. Heart:  S1S2, no rubs, murmurs, gallops. + irregular rhytm Abdomen:  soft, non-tender, LLQ colostomy Lymph:  no cervical or supraclavicular adenopathy. Neuro:  A&O x 3.  Psych:  Slightly flat affect   Data Reviewed: Prior GI notes. Prior colonoscopy reports.       Assessment & Plan:  Dysphagia, pharyngoesophageal phase  Loss of weight  Anorexia  Warfarin anticoagulation - A fib   1) Needs evaluation with an upper endoscopy with plans for possible esophageal dilation 2) will need to hold warfarin 3-5 d before this 3) The risks and benefits as well as alternatives of endoscopic procedure(s) have been discussed and reviewed. All questions answered. The patient  agrees to proceed. I also explained risk of possible stroke off warfarin. 4) I think some of the weight loss if not all could be from diuresis and losing edema etc. It also sounds older like he could be depressed. At any rate with dysphagia and anorexia and these problems it's appropriate to perform an upper endoscopy and plan for possible esophageal dilation.  I appreciate the opportunity to care for this patient. CC: Leonard Downing, MD

## 2014-10-29 NOTE — Telephone Encounter (Signed)
Advised patient and will forward to anticoagulation clinic

## 2014-10-29 NOTE — Patient Instructions (Signed)
You have been scheduled for an endoscopy. Please follow written instructions given to you at your visit today. If you use inhalers (even only as needed), please bring them with you on the day of your procedure. Your physician has requested that you go to www.startemmi.com and enter the access code given to you at your visit today. This web site gives a general overview about your procedure. However, you should still follow specific instructions given to you by our office regarding your preparation for the procedure.   You will be contaced by our office prior to your procedure for directions on holding your Coumadin/Warfarin.  If you do not hear from our office 1 week prior to your scheduled procedure, please call [phone] to discuss.   I appreciate the opportunity to care for you.

## 2014-10-29 NOTE — Telephone Encounter (Signed)
[facility] [address] [place] [address]  10/29/2014   RE: Nicholas [name] DOB: 03/11/1934 MRN: [id number]   Dear Darlin Coco M.D.,    We have scheduled the above patient for an EGD with dilation endoscopic procedure. Our records show that he is on anticoagulation therapy.   Please advise as to how long the patient may come off his therapy of warfarin prior to the procedure, which is scheduled for 11/27/14.  Please fax back/ or route the completed form to Klani Caridi [place], [place] at [phone].   Sincerely,    Silvano Rusk M.D.

## 2014-10-30 ENCOUNTER — Encounter: Payer: Self-pay | Admitting: Internal Medicine

## 2014-11-04 ENCOUNTER — Encounter: Payer: Self-pay | Admitting: Internal Medicine

## 2014-11-04 ENCOUNTER — Telehealth: Payer: Self-pay | Admitting: Cardiology

## 2014-11-04 NOTE — Telephone Encounter (Signed)
Patient phoned c/o of feeling weak lately, worse after he takes his medications Patient went into the hospital on Toprol 25 mg 1/2 tablet daily and d/c on Toprol 100 mg daily Patient concerned with this high dose Patient has not been checking blood pressure at home On 11/3 at GI appointment blood pressure was 90/60 HR 64 Discussed with D Dunn PA recommendations as follows:  Start checking blood pressure and daily  Decrease Bidil 20-37.5 to 1/2 tablet twice a day  Send in a remoter pacer check  Call back if no better Advised patient

## 2014-11-04 NOTE — Telephone Encounter (Signed)
New message          Pt has questions about his medications

## 2014-11-05 ENCOUNTER — Telehealth: Payer: Self-pay | Admitting: Cardiology

## 2014-11-05 NOTE — Telephone Encounter (Signed)
-----   Message from Emily Filbert, RN sent at 11/05/2014  7:54 AM EST ----- Can you please call this patient? Thank you !  ----- Message -----    From: Earvin Hansen    Sent: 11/04/2014   7:29 PM      To: Emily Filbert, RN  Hi Heather, This patient is having some weakness and Hinton Dyer D wanted a remote check. Patient has new pacer and does not know how to do that. Can you or someone who can help him with that call after 1:30 pm tomorrow. Thanks  [facility]

## 2014-11-05 NOTE — Telephone Encounter (Signed)
Called pt and attempted to walk pt wife through a manual transmission. We tried several different task but was unable to get monitor to send transmission. Gave pt wife tech services number to call and receive help with trouble shooting monitor.

## 2014-11-12 ENCOUNTER — Ambulatory Visit (INDEPENDENT_AMBULATORY_CARE_PROVIDER_SITE_OTHER): Payer: Medicare Other | Admitting: Pharmacist Clinician (PhC)/ Clinical Pharmacy Specialist

## 2014-11-12 DIAGNOSIS — Z5181 Encounter for therapeutic drug level monitoring: Secondary | ICD-10-CM

## 2014-11-12 DIAGNOSIS — I4891 Unspecified atrial fibrillation: Secondary | ICD-10-CM

## 2014-11-12 LAB — POCT INR: INR: 1.8

## 2014-11-13 ENCOUNTER — Other Ambulatory Visit (HOSPITAL_BASED_OUTPATIENT_CLINIC_OR_DEPARTMENT_OTHER): Payer: Medicare Other

## 2014-11-13 ENCOUNTER — Telehealth: Payer: Self-pay | Admitting: Nurse Practitioner

## 2014-11-13 ENCOUNTER — Telehealth: Payer: Self-pay | Admitting: *Deleted

## 2014-11-13 ENCOUNTER — Ambulatory Visit (HOSPITAL_BASED_OUTPATIENT_CLINIC_OR_DEPARTMENT_OTHER): Payer: Medicare Other | Admitting: Nurse Practitioner

## 2014-11-13 ENCOUNTER — Ambulatory Visit: Payer: Medicare Other

## 2014-11-13 VITALS — BP 114/71 | HR 78 | Temp 97.4°F | Resp 18 | Ht 70.0 in | Wt 151.4 lb

## 2014-11-13 DIAGNOSIS — D649 Anemia, unspecified: Secondary | ICD-10-CM

## 2014-11-13 DIAGNOSIS — R630 Anorexia: Secondary | ICD-10-CM

## 2014-11-13 DIAGNOSIS — R1310 Dysphagia, unspecified: Secondary | ICD-10-CM

## 2014-11-13 DIAGNOSIS — D631 Anemia in chronic kidney disease: Secondary | ICD-10-CM

## 2014-11-13 DIAGNOSIS — Z85048 Personal history of other malignant neoplasm of rectum, rectosigmoid junction, and anus: Secondary | ICD-10-CM

## 2014-11-13 DIAGNOSIS — N184 Chronic kidney disease, stage 4 (severe): Secondary | ICD-10-CM

## 2014-11-13 DIAGNOSIS — R634 Abnormal weight loss: Secondary | ICD-10-CM

## 2014-11-13 DIAGNOSIS — N189 Chronic kidney disease, unspecified: Secondary | ICD-10-CM

## 2014-11-13 DIAGNOSIS — E875 Hyperkalemia: Secondary | ICD-10-CM

## 2014-11-13 LAB — CBC WITH DIFFERENTIAL/PLATELET
BASO%: 0.8 % (ref 0.0–2.0)
Basophils Absolute: 0 10*3/uL (ref 0.0–0.1)
EOS ABS: 0.2 10*3/uL (ref 0.0–0.5)
EOS%: 2.7 % (ref 0.0–7.0)
HCT: 38.4 % (ref 38.4–49.9)
HGB: 12.4 g/dL — ABNORMAL LOW (ref 13.0–17.1)
LYMPH%: 15.9 % (ref 14.0–49.0)
MCH: 32 pg (ref 27.2–33.4)
MCHC: 32.3 g/dL (ref 32.0–36.0)
MCV: 99.3 fL — ABNORMAL HIGH (ref 79.3–98.0)
MONO#: 0.5 10*3/uL (ref 0.1–0.9)
MONO%: 8.6 % (ref 0.0–14.0)
NEUT%: 72 % (ref 39.0–75.0)
NEUTROS ABS: 4.3 10*3/uL (ref 1.5–6.5)
PLATELETS: 117 10*3/uL — AB (ref 140–400)
RBC: 3.86 10*6/uL — AB (ref 4.20–5.82)
RDW: 14.9 % — ABNORMAL HIGH (ref 11.0–14.6)
WBC: 6 10*3/uL (ref 4.0–10.3)
lymph#: 1 10*3/uL (ref 0.9–3.3)

## 2014-11-13 LAB — COMPREHENSIVE METABOLIC PANEL (CC13)
ALT: 22 U/L (ref 0–55)
ANION GAP: 9 meq/L (ref 3–11)
AST: 23 U/L (ref 5–34)
Albumin: 4 g/dL (ref 3.5–5.0)
Alkaline Phosphatase: 74 U/L (ref 40–150)
BUN: 56 mg/dL — ABNORMAL HIGH (ref 7.0–26.0)
CALCIUM: 10.9 mg/dL — AB (ref 8.4–10.4)
CHLORIDE: 103 meq/L (ref 98–109)
CO2: 27 meq/L (ref 22–29)
Creatinine: 3.3 mg/dL (ref 0.7–1.3)
Glucose: 110 mg/dl (ref 70–140)
SODIUM: 139 meq/L (ref 136–145)
TOTAL PROTEIN: 6.9 g/dL (ref 6.4–8.3)
Total Bilirubin: 0.92 mg/dL (ref 0.20–1.20)

## 2014-11-13 MED ORDER — SODIUM POLYSTYRENE SULFONATE 15 GM/60ML PO SUSP: 30.0000 g | Freq: Once | ORAL | Status: DC

## 2014-11-13 NOTE — Telephone Encounter (Signed)
Called and informed [name] Nurse [name]) that patient's potassium and kidney function has increased and that we prescribed kayexalate.  Also asked if patient could be worked in sooner than 12/02/14. Per [name]. [name], NP. [name]'s nurse [name]) stated that patient is on a potassium restricted diet.  Patient's appointment was moved to 11/25/14 with [name].  Informed [name]. [name], NP.

## 2014-11-13 NOTE — Progress Notes (Addendum)
  [place] OFFICE PROGRESS NOTE   Diagnosis:  Anemia secondary to renal failure, history of rectal cancer  INTERVAL HISTORY:   Mr. Liby returns as scheduled. He reports continued anorexia and weight loss. The leg swelling has completely resolved. He continues to feel the weight loss is related to improvement in the edema. He reports dysphagia over the past 2 months. He is scheduled for an upper endoscopy by Dr. Carlean Purl on 11/27/2014. He states that he feels "weak and tired".  Objective:  Vital signs in last 24 hours:  Blood pressure 114/71, pulse 78, temperature 97.4 F (36.3 C), temperature source Oral, resp. rate 18, height 5\' 10"  (1.778 m), weight 151 lb 6.4 oz (68.675 kg), SpO2 98 %.    HEENT: no thrush or ulcers. Mucous membranes appear moist. Lymphatics: no palpable cervical, supraclavicular or inguinal lymph nodes. Soft, mobile small axillary lymph nodes bilaterally. Resp: lungs clear bilaterally. Cardio: irregular. GI: abdomen soft and nontender. No hepatomegaly. Left lower quadrant colostomy. No nodularity at the perineum. Vascular: no leg edema. Neuro:alert and oriented.    Lab Results:  Lab Results  Component Value Date   WBC 6.0 11/13/2014   HGB 12.4* 11/13/2014   HCT 38.4 11/13/2014   MCV 99.3* 11/13/2014   PLT 117* 11/13/2014   NEUTROABS 4.3 11/13/2014    Imaging:  No results found.  Medications: I have reviewed the patient's current medications.  Assessment/Plan: 1. Rectal cancer, stage II, T3 N0, status post neoadjuvant infusional 5-fluorouracil and radiation, status post APR 06/01/2007. He completed 4 months of adjuvant Xeloda.  Colonoscopy June 2012-negative, repeat planned for 5 years 2. Anemia. Likely related to renal failure versus myelodysplasia. Improved with erythropoietin. 3. Admission with pyelonephritis 11/20/2013. 4. Chronic renal failure. 5. Mild thrombocytopenia-? Myelodysplasia,? ITP-stable 6. Anorexia/weight loss.  Question etiology. 7. Dysphagia. Upper endoscopy planned 11/27/2014.   Disposition: Mr. Centola continues to have anorexia and weight loss of unclear etiology. His kidney function is worse on labs today and he has hyperkalemia. He will take Kayexalate 30 g for the hyperkalemia. We will contact [name] office with the lab results and to request a sooner appointment. Dr. Benay Spice is concerned the anorexia/weight loss and fatigue/malaise are related to renal failure.  He reports a two-month history of dysphagia. An upper endoscopy is scheduled in early December.  Erythropoietin will be held today due to a hemoglobin of 12.4.   He will return for a followup visit, labs and possible erythropoietin injection on 12/18/2014.  Patient seen with Dr. Benay Spice. 25 minutes were spent face-to-face at today's visit with the majority of that time involved in counseling/coordination of care.    Ned Card ANP/GNP-BC   11/13/2014  10:38 AM  This was a shared visit with Ned Card. Mr. Smethers has developed anorexia and weight loss. He has renal failure. His symptoms may be related to renal failure. The potassium is high today. Will give him a dose of Kayexalate and try to get him an urgent appointment with nephrology.  Julieanne Manson, M.D.

## 2014-11-13 NOTE — Telephone Encounter (Signed)
Pt confirmed labs/ov per 11/18 POF, gave pt AVS..... KJ °

## 2014-11-13 NOTE — Progress Notes (Signed)
Hgb:12.4. No aranesp given today

## 2014-11-15 ENCOUNTER — Ambulatory Visit (INDEPENDENT_AMBULATORY_CARE_PROVIDER_SITE_OTHER): Payer: Medicare Other

## 2014-11-15 DIAGNOSIS — I4891 Unspecified atrial fibrillation: Secondary | ICD-10-CM

## 2014-11-15 DIAGNOSIS — Z5181 Encounter for therapeutic drug level monitoring: Secondary | ICD-10-CM

## 2014-11-15 MED ORDER — ENOXAPARIN SODIUM 60 MG/0.6ML ~~LOC~~ SOLN: 60.0000 mg | SUBCUTANEOUS | Status: DC

## 2014-11-15 NOTE — Patient Instructions (Signed)
11/21/14 Take your last dosage of Coumadin.   11/22/14 No Coumadin, No Lovenox.  11/23/14 Start taking Lovenox 60mg  once daily in the am. 11/24/14 Take Lovenox 60mg  once daily in the am. 11/25/14 Take Lovenox 60mg  once daily in the am. 11/26/14 Take you last dosage of Lovenox 60mg  in the am prior to procedure on 11/27/14 at 4pm.  11/27/14 No Lovenox on Procedure day.    Resume Lovenox 60mg  once daily in the am once OK to do so by MD.  Continue Lovenox daily until INR rechecked and INR 2.0 or greater.  Resume Coumadin post procedure once ok by MD to do so, take 1.5 tablets x 2 doses, then resume same dosage 1 tablet daily except 1.5 tablets on Wednesdays.

## 2014-11-20 ENCOUNTER — Ambulatory Visit: Payer: Medicare Other

## 2014-11-20 ENCOUNTER — Other Ambulatory Visit: Payer: Medicare Other

## 2014-11-27 ENCOUNTER — Ambulatory Visit (AMBULATORY_SURGERY_CENTER): Payer: Medicare Other | Admitting: Internal Medicine

## 2014-11-27 ENCOUNTER — Encounter: Payer: Self-pay | Admitting: Internal Medicine

## 2014-11-27 VITALS — BP 143/88 | HR 89 | Temp 96.8°F | Resp 12 | Ht 70.0 in | Wt 155.0 lb

## 2014-11-27 DIAGNOSIS — R1314 Dysphagia, pharyngoesophageal phase: Secondary | ICD-10-CM

## 2014-11-27 DIAGNOSIS — K222 Esophageal obstruction: Secondary | ICD-10-CM

## 2014-11-27 MED ORDER — PANTOPRAZOLE SODIUM 40 MG PO TBEC: 40.0000 mg | DELAYED_RELEASE_TABLET | Freq: Every day | ORAL | Status: DC

## 2014-11-27 MED ORDER — SODIUM CHLORIDE 0.9 % IV SOLN: 500.0000 mL | INTRAVENOUS | Status: DC

## 2014-11-27 NOTE — Patient Instructions (Addendum)
I dilated the esophagus to help your swallowing.  If this does not fix your problem then return to see me.  Resume Lovenox 60mg  once daily in the am on 11/28/2014 (TOMORROW) . Continue Lovenox daily until INR rechecked and INR 2.0 or greater. Resume Coumadin 11/28/2014 (TOMORROW), take 1.5 tablets daily 12/3 and 12/4 , then resume same dosage 1 tablet daily except 1.5 tablets on Wednesdays.   I appreciate the opportunity to care for you. Gatha Mayer, MD, [facility]  Dilation diet (handout given) Hiatal hernia (handout given)  YOU HAD AN ENDOSCOPIC PROCEDURE TODAY AT [place]: Refer to the procedure report that was given to you for any specific questions about what was found during the examination.  If the procedure report does not answer your questions, please call your gastroenterologist to clarify.  If you requested that your care partner not be given the details of your procedure findings, then the procedure report has been included in a sealed envelope for you to review at your convenience later.  YOU SHOULD EXPECT: Some feelings of bloating in the abdomen. Passage of more gas than usual.  Walking can help get rid of the air that was put into your GI tract during the procedure and reduce the bloating. If you had a lower endoscopy (such as a colonoscopy or flexible sigmoidoscopy) you may notice spotting of blood in your stool or on the toilet paper. If you underwent a bowel prep for your procedure, then you may not have a normal bowel movement for a few days.  DIET: Your first meal following the procedure should be a light meal and then it is ok to progress to your normal diet.  A half-sandwich or bowl of soup is an example of a good first meal.  Heavy or fried foods are harder to digest and may make you feel nauseous or bloated.  Likewise meals heavy in dairy and vegetables can cause extra gas to form and this can also increase the bloating.  Drink plenty of fluids but  you should avoid alcoholic beverages for 24 hours.  ACTIVITY: Your care partner should take you home directly after the procedure.  You should plan to take it easy, moving slowly for the rest of the day.  You can resume normal activity the day after the procedure however you should NOT DRIVE or use heavy machinery for 24 hours (because of the sedation medicines used during the test).    SYMPTOMS TO REPORT IMMEDIATELY: A gastroenterologist can be reached at any hour.  During normal business hours, 8:30 AM to 5:00 PM Monday through Friday, call [phone].  After hours and on weekends, please call the GI answering service at [phone] who will take a message and have the physician on call contact you.    Following upper endoscopy (EGD)  Vomiting of blood or coffee ground material  New chest pain or pain under the shoulder blades  Painful or persistently difficult swallowing  New shortness of breath  Fever of 100F or higher  Black, tarry-looking stools  FOLLOW UP: If any biopsies were taken you will be contacted by phone or by letter within the next 1-3 weeks.  Call your gastroenterologist if you have not heard about the biopsies in 3 weeks.  Our staff will call the home number listed on your records the next business day following your procedure to check on you and address any questions or concerns that you may have at that time regarding  the information given to you following your procedure. This is a courtesy call and so if there is no answer at the home number and we have not heard from you through the emergency physician on call, we will assume that you have returned to your regular daily activities without incident.  SIGNATURES/CONFIDENTIALITY: You and/or your care partner have signed paperwork which will be entered into your electronic medical record.  These signatures attest to the fact that that the information above on your After Visit Summary has been reviewed and is  understood.  Full responsibility of the confidentiality of this discharge information lies with you and/or your care-partner.

## 2014-11-27 NOTE — Op Note (Signed)
[facility]  [address] [place], [address]   ENDOSCOPY PROCEDURE REPORT  PATIENT: Nicholas, [name]  MR#: [id number] BIRTHDATE: November 13, 1934 , 44  yrs. old GENDER: male ENDOSCOPIST: Gatha Mayer, MD, FACG  nt PROCEDURE DATE:  11/27/2014 PROCEDURE:   EGD with balloon dilatation ASA CLASS:   Class III INDICATIONS:dysphagia. MEDICATIONS: Propofol 120 mg IV and Monitored anesthesia care TOPICAL ANESTHETIC:   none  DESCRIPTION OF PROCEDURE:   After the risks benefits and alternatives of the procedure were thoroughly explained, informed consent was obtained.  The [id number]  endoscope was introduced through the mouth  and advanced to the second portion of the duodenum ,      The instrument was slowly withdrawn as the mucosa was carefully examined.    ESOPHAGUS: A stricture was found at the gastroesophageal junction. The stenosis was traversable with the endoscope.   The remainder of the upper endoscopy exam was otherwise normal.   STOMACH: A small hernia was found.  Dilation was then performed at the gastroesphageal junction  Dilator:Balloon Size:18 and 19 mm  Reststance:moderate Heme:yes - slight  COMPLICATIONS: There were no immediate complications.  ENDOSCOPIC IMPRESSION: 1.   Stricture was found at the gastroesophageal junction - ring dilated to 19 mm 2.   The remainder of the upper endoscopy exam was otherwise normal.  3.   Hiatus hernia (small)nd  RECOMMENDATIONS: 1.  Clear liquids until 530 PM, then soft foods rest of day.  Resume prior diet tomorrow. 2.  Follow-up as needed  eSigned:  Gatha Mayer, MD, [facility] 11/27/2014 4:35 PM  CC:       Claris Gower, MD and The Patient

## 2014-11-27 NOTE — Progress Notes (Signed)
Report to PACU, RN, vss, BBS= Clear.  

## 2014-11-27 NOTE — Progress Notes (Signed)
Called to room to assist during endoscopic procedure.  Patient ID and intended procedure confirmed with present staff. Received instructions for my participation in the procedure from the performing physician.  

## 2014-11-28 ENCOUNTER — Telehealth: Payer: Self-pay | Admitting: *Deleted

## 2014-11-28 NOTE — Telephone Encounter (Signed)
  Follow up Call-  Call back number 11/27/2014  Post procedure Call Back phone  # [phone]  Permission to leave phone message Yes     Patient questions:  Phone just kept ringing.

## 2014-12-02 ENCOUNTER — Ambulatory Visit (INDEPENDENT_AMBULATORY_CARE_PROVIDER_SITE_OTHER): Payer: Medicare Other | Admitting: Pharmacist

## 2014-12-02 DIAGNOSIS — Z5181 Encounter for therapeutic drug level monitoring: Secondary | ICD-10-CM

## 2014-12-02 DIAGNOSIS — I4891 Unspecified atrial fibrillation: Secondary | ICD-10-CM

## 2014-12-02 LAB — POCT INR: INR: 1.5

## 2014-12-05 ENCOUNTER — Encounter (HOSPITAL_COMMUNITY): Payer: Self-pay | Admitting: Internal Medicine

## 2014-12-12 ENCOUNTER — Ambulatory Visit (INDEPENDENT_AMBULATORY_CARE_PROVIDER_SITE_OTHER): Payer: Medicare Other | Admitting: Cardiology

## 2014-12-12 ENCOUNTER — Ambulatory Visit (INDEPENDENT_AMBULATORY_CARE_PROVIDER_SITE_OTHER): Payer: Medicare Other

## 2014-12-12 ENCOUNTER — Other Ambulatory Visit (INDEPENDENT_AMBULATORY_CARE_PROVIDER_SITE_OTHER): Payer: Medicare Other | Admitting: *Deleted

## 2014-12-12 VITALS — BP 110/68 | HR 84 | Ht 70.0 in | Wt 154.0 lb

## 2014-12-12 DIAGNOSIS — Z950 Presence of cardiac pacemaker: Secondary | ICD-10-CM

## 2014-12-12 DIAGNOSIS — N179 Acute kidney failure, unspecified: Secondary | ICD-10-CM

## 2014-12-12 DIAGNOSIS — I482 Chronic atrial fibrillation, unspecified: Secondary | ICD-10-CM

## 2014-12-12 DIAGNOSIS — I2510 Atherosclerotic heart disease of native coronary artery without angina pectoris: Secondary | ICD-10-CM

## 2014-12-12 DIAGNOSIS — I4891 Unspecified atrial fibrillation: Secondary | ICD-10-CM

## 2014-12-12 DIAGNOSIS — E7800 Pure hypercholesterolemia, unspecified: Secondary | ICD-10-CM

## 2014-12-12 DIAGNOSIS — Z85048 Personal history of other malignant neoplasm of rectum, rectosigmoid junction, and anus: Secondary | ICD-10-CM

## 2014-12-12 DIAGNOSIS — I5033 Acute on chronic diastolic (congestive) heart failure: Secondary | ICD-10-CM

## 2014-12-12 DIAGNOSIS — Z5181 Encounter for therapeutic drug level monitoring: Secondary | ICD-10-CM

## 2014-12-12 DIAGNOSIS — I119 Hypertensive heart disease without heart failure: Secondary | ICD-10-CM

## 2014-12-12 DIAGNOSIS — I2583 Coronary atherosclerosis due to lipid rich plaque: Secondary | ICD-10-CM

## 2014-12-12 DIAGNOSIS — N189 Chronic kidney disease, unspecified: Secondary | ICD-10-CM

## 2014-12-12 LAB — LIPID PANEL
Cholesterol: 126 mg/dL (ref 0–200)
HDL: 39.2 mg/dL (ref >39.00)
LDL CALC: 70 mg/dL (ref 0–99)
NONHDL: 86.8
Total CHOL/HDL Ratio: 3
Triglycerides: 83 mg/dL (ref 0.0–149.0)
VLDL: 16.6 mg/dL (ref 0.0–40.0)

## 2014-12-12 LAB — HEPATIC FUNCTION PANEL
ALK PHOS: 59 U/L (ref 39–117)
ALT: 17 U/L (ref 0–53)
AST: 27 U/L (ref 0–37)
Albumin: 3.9 g/dL (ref 3.5–5.2)
BILIRUBIN DIRECT: 0.1 mg/dL (ref 0.0–0.3)
Total Bilirubin: 1.1 mg/dL (ref 0.2–1.2)
Total Protein: 6.5 g/dL (ref 6.0–8.3)

## 2014-12-12 LAB — CBC WITH DIFFERENTIAL/PLATELET
Basophils Absolute: 0 10*3/uL (ref 0.0–0.1)
Basophils Relative: 0.5 % (ref 0.0–3.0)
EOS ABS: 0.2 10*3/uL (ref 0.0–0.7)
Eosinophils Relative: 2.9 % (ref 0.0–5.0)
HEMATOCRIT: 36.3 % — AB (ref 39.0–52.0)
HEMOGLOBIN: 11.8 g/dL — AB (ref 13.0–17.0)
LYMPHS ABS: 1.2 10*3/uL (ref 0.7–4.0)
Lymphocytes Relative: 16.2 % (ref 12.0–46.0)
MCHC: 32.5 g/dL (ref 30.0–36.0)
MCV: 100.1 fl — AB (ref 78.0–100.0)
Monocytes Absolute: 0.6 10*3/uL (ref 0.1–1.0)
Monocytes Relative: 7.8 % (ref 3.0–12.0)
NEUTROS ABS: 5.6 10*3/uL (ref 1.4–7.7)
Neutrophils Relative %: 72.6 % (ref 43.0–77.0)
Platelets: 136 10*3/uL — ABNORMAL LOW (ref 150.0–400.0)
RBC: 3.63 Mil/uL — ABNORMAL LOW (ref 4.22–5.81)
RDW: 16 % — AB (ref 11.5–15.5)
WBC: 7.7 10*3/uL (ref 4.0–10.5)

## 2014-12-12 LAB — BASIC METABOLIC PANEL
BUN: 31 mg/dL — ABNORMAL HIGH (ref 6–23)
CO2: 30 meq/L (ref 19–32)
Calcium: 10.1 mg/dL (ref 8.4–10.5)
Chloride: 99 mEq/L (ref 96–112)
Creatinine, Ser: 3 mg/dL — ABNORMAL HIGH (ref 0.4–1.5)
GFR: 21.92 mL/min — AB (ref >60.00)
Glucose, Bld: 92 mg/dL (ref 70–99)
Potassium: 4.2 mEq/L (ref 3.5–5.1)
Sodium: 140 mEq/L (ref 135–145)

## 2014-12-12 LAB — POCT INR: INR: 2

## 2014-12-12 MED ORDER — ISOSORBIDE DINITRATE 10 MG PO TABS: 10.0000 mg | ORAL_TABLET | Freq: Two times a day (BID) | ORAL | Status: DC

## 2014-12-12 MED ORDER — HYDRALAZINE HCL 25 MG PO TABS: 25.0000 mg | ORAL_TABLET | Freq: Two times a day (BID) | ORAL | Status: AC

## 2014-12-12 NOTE — Progress Notes (Signed)
Quick Note:  Please report to patient. The recent labs are stable. Continue same medication and careful diet. Kidney function is stable. Mild anemia is present consistent with chronic kidney disease. Lipids are good. ______

## 2014-12-12 NOTE — Assessment & Plan Note (Signed)
The patient's exertional dyspnea have improved since his biventricular pacemaker

## 2014-12-12 NOTE — Patient Instructions (Signed)
STOP BIDIL  START ISOSORBIDE DINITRATE 10 MG TWICE A DAY  START HYDRALAZINE 25 MG TWICE A DAY  Your physician recommends that you schedule a follow-up appointment in: 3 months with fasting labs (LP/BMET/HFP) AND EKG  Go to [facility] regarding smaller colostomy patch

## 2014-12-12 NOTE — Assessment & Plan Note (Signed)
His colostomy bag no longer fits him well.  He will go to one of the medical supply houses and talk to them about a different size colostomy bag

## 2014-12-12 NOTE — Assessment & Plan Note (Signed)
The patient is on simvastatin.  He is not having any myalgias.

## 2014-12-12 NOTE — Assessment & Plan Note (Signed)
The patient has not been experiencing any recurrent chest pain or angina. 

## 2014-12-12 NOTE — Progress Notes (Signed)
Nicholas [name] Date of Birth:  14-Oct-1934 [facility] [address] [place], [place]  [address] [phone]        Fax   [phone]   History of Present Illness: This pleasant 80 year old gentleman is seen for a followup office visit. He has a past history of ischemic heart disease and had coronary artery bypass graft surgery on 05/27/92. He has a past history of compensated congestive heart failure. He had a nuclear stress test in June 2010 showing an old inferior scar but no reversible ischemia and his ejection fraction was 51%. . When Dr. Ron Parker saw him as a work in in July 2014 his EKG showed atrial fibrillation which was new since 2013. His most recent echocardiogram on 07/31/13 showed normal systolic function with ejection fraction 55% and there was inferior wall akinesis. There was biatrial enlargement. There was mild to moderate mitral regurgitation and his pulmonary artery pressure was elevated at 46-50 mm mercury.  Since his last visit he has diuresed well and has lost 8 pounds since 05/21/14. The patient was hospitalized over Thanksgiving 2014 with acute renal failure. Because his GFR was less than 15 he was no longer able to take Xarelto and he was placed on just a baby aspirin for his atrial fibrillation. He was found to have a nonfunctioning right kidney and he had a history of passage of a kidney stone with hematuria. His creatinine was 5.3 at discharge from the hospital.  He is being followed by nephrology.  He is now on warfarin for his atrial fibrillation.  Since last visit he has been doing well.  He said no chest pain.  He does have some exertional dyspnea. He has a past history of colon cancer and has a functioning colostomy. In October 2015 the patient presented with high-grade AV block and underwent insertion of a Medtronic biventricular pacemaker on 10/01/14 by Dr. Rayann Heman  Current Outpatient Prescriptions  Medication Sig Dispense Refill  . feeding  supplement, ENSURE COMPLETE, (ENSURE COMPLETE) LIQD Take 237 mLs by mouth daily.    . ferrous sulfate 325 (65 FE) MG tablet Take 325 mg by mouth 2 (two) times daily with a meal.     . finasteride (PROSCAR) 5 MG tablet Take 5 mg by mouth daily.      . furosemide (LASIX) 40 MG tablet Take 1 tablet (40 mg total) by mouth daily.    . metoprolol succinate (TOPROL-XL) 50 MG 24 hr tablet Take 50 mg by mouth daily. Take with or immediately following a meal.    . nitroGLYCERIN (NITROSTAT) 0.4 MG SL tablet Place 1 tablet (0.4 mg total) under the tongue every 5 (five) minutes as needed. For chest pain 25 tablet 6  . pantoprazole (PROTONIX) 40 MG tablet Take 1 tablet (40 mg total) by mouth daily. 90 tablet 3  . simvastatin (ZOCOR) 20 MG tablet Take 1 tablet (20 mg total) by mouth at bedtime. 90 tablet 3  . tamsulosin (FLOMAX) 0.4 MG CAPS capsule Take 1 capsule (0.4 mg total) by mouth daily after supper. 30 capsule 1  . vitamin C (ASCORBIC ACID) 500 MG tablet Take 500 mg by mouth daily.      . Vitamins-Lipotropics (B-100 COMPLEX) TABS Take 1 tablet by mouth daily.     Marland Kitchen warfarin (COUMADIN) 3 MG tablet Take 3-4.5 mg by mouth daily. 4.5mg  on Wednesdays all other days take 3mg     . hydrALAZINE (APRESOLINE) 25 MG tablet Take 1 tablet (25 mg  total) by mouth 2 (two) times daily. 60 tablet 5  . isosorbide dinitrate (ISORDIL) 10 MG tablet Take 1 tablet (10 mg total) by mouth 2 (two) times daily. 60 tablet 5   No current facility-administered medications for this visit.    Allergies  Allergen Reactions  . Cephalexin Other (See Comments)    "too strong for my system" - per office note 05/05/2012  . Ciprofloxacin Other (See Comments)    Reaction unknown  . Hctz [Hydrochlorothiazide]     DIZZINESS   . Levofloxacin Other (See Comments)    "too strong for my system" - per office note 05/05/2012  . Lipitor [Atorvastatin Calcium]     MUSCLE PAIN  . Niaspan [Niacin Er]     FLUSHING   . Oxycodone-Acetaminophen  Other (See Comments)    Reaction unknown  . Sulfonamide Derivatives Other (See Comments)    Reaction unkonwn    Patient Active Problem List   Diagnosis Date Noted  . Hx of CABG 05/27/2011    Priority: High  . Asymptomatic PVCs 05/27/2011    Priority: High  . Essential hypertension 05/08/2009    Priority: High  . Biventricular cardiac pacemaker in situ 12/12/2014  . Acute on chronic diastolic HF (heart failure) 09/27/2014  . Weakness generalized 09/27/2014  . CKD (chronic kidney disease) stage 4, GFR 15-29 ml/min   . Encounter for therapeutic drug monitoring 03/25/2014  . Acute on chronic kidney failure 11/20/2013  . Atrial fibrillation with slow ventricular response 07/10/2013  . Volume overload 07/10/2013  . Coronary artery disease   . Ejection fraction   . BPH (benign prostatic hyperplasia) 09/21/2011  . Personal history of malignant neoplasm of rectum, rectosigmoid junction, and anus 05/14/2011  . GERD (gastroesophageal reflux disease) 05/14/2011  . Hernia of abdominal cavity 05/14/2011  . HYPERCHOLESTEROLEMIA 05/08/2009  . MYOCARDIAL INFARCTION 05/08/2009  . GERD 05/08/2009  . DEGENERATIVE JOINT DISEASE, GENERALIZED 05/08/2009  . COLONIC POLYPS, ADENOMATOUS, HX OF 05/08/2009    History  Smoking status  . Former Smoker -- 10 years  . Types: Pipe, Cigars  . Quit date: 05/26/1991  Smokeless tobacco  . Current User  . Types: Chew    History  Alcohol Use No    Family History  Problem Relation Age of Onset  . Pancreatic cancer Father   . Diabetes Son   . Emphysema Mother   . Anesthesia problems Neg Hx   . Hypotension Neg Hx   . Malignant hyperthermia Neg Hx   . Pseudochol deficiency Neg Hx     Review of Systems: Constitutional: no fever chills diaphoresis or fatigue or change in weight.  Head and neck: no hearing loss, no epistaxis, no photophobia or visual disturbance. Respiratory: No cough, shortness of breath or wheezing. Cardiovascular: No chest pain  peripheral edema, palpitations. Gastrointestinal: No abdominal distention, no abdominal pain, no change in bowel habits hematochezia or melena. Genitourinary: No dysuria, no frequency, no urgency, no nocturia. Musculoskeletal:No arthralgias, no back pain, no gait disturbance or myalgias. Neurological: No dizziness, no headaches, no numbness, no seizures, no syncope, no weakness, no tremors. Hematologic: No lymphadenopathy, no easy bruising. Psychiatric: No confusion, no hallucinations, no sleep disturbance.    Physical Exam: Filed Vitals:   12/12/14 0811  BP: 110/68  Pulse: 84  The patient appears to be in no distress.  Head and neck exam reveals that the pupils are equal and reactive.  The extraocular movements are full.  There is no scleral icterus.  Mouth and pharynx are benign.  No lymphadenopathy.  No carotid bruits.  The jugular venous pressure is normal.  Thyroid is not enlarged or tender.  Chest is clear to percussion and auscultation.  No rales or rhonchi.  Expansion of the chest is symmetrical.  Heart reveals no abnormal lift or heave.  First and second heart sounds are normal.  The pulse is irregular.  There is no murmur gallop rub or click.  The abdomen is soft and nontender.  Bowel sounds are normoactive.  There is no hepatosplenomegaly or mass.  There is a colostomy bag in the left lower quadrant.  The colostomy bag does not fit as well now that he has lost a lot of weight.  There are no abdominal bruits.  Extremities reveal trace edema.    Neurologic exam is normal strength and no lateralizing weakness.  No sensory deficits.  Integument reveals no rash  EKG shows true fibrillation with paced ventricular response and frequent unifocal PVCs  Assessment / Plan: 1. chronic atrial fibrillation 2. ischemic heart disease status post CABG in 1993 3. 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 4.  Past history of colon cancer with functioning colostomy. 5.  hypertensive heart disease 6. Hypercholesterolemia 7.  Status post Medtronic biventricular pacemaker on 10/01/14 for high-grade AV block and permanent atrial fibrillation  Disposition: Continue same medication.  Recheck in 3 months for office visit CBC lipid panel hepatic function panel and basal metabolic panel and EKG.

## 2014-12-18 ENCOUNTER — Ambulatory Visit: Payer: Medicare Other

## 2014-12-18 ENCOUNTER — Telehealth: Payer: Self-pay | Admitting: Oncology

## 2014-12-18 ENCOUNTER — Ambulatory Visit (HOSPITAL_BASED_OUTPATIENT_CLINIC_OR_DEPARTMENT_OTHER): Payer: Medicare Other | Admitting: Lab

## 2014-12-18 ENCOUNTER — Other Ambulatory Visit: Payer: Medicare Other

## 2014-12-18 ENCOUNTER — Ambulatory Visit (HOSPITAL_BASED_OUTPATIENT_CLINIC_OR_DEPARTMENT_OTHER): Payer: Medicare Other | Admitting: Oncology

## 2014-12-18 ENCOUNTER — Encounter: Payer: Self-pay | Admitting: Internal Medicine

## 2014-12-18 VITALS — BP 113/50 | HR 42 | Resp 18 | Ht 70.0 in | Wt 152.3 lb

## 2014-12-18 DIAGNOSIS — N179 Acute kidney failure, unspecified: Secondary | ICD-10-CM

## 2014-12-18 DIAGNOSIS — N184 Chronic kidney disease, stage 4 (severe): Secondary | ICD-10-CM

## 2014-12-18 DIAGNOSIS — D649 Anemia, unspecified: Secondary | ICD-10-CM

## 2014-12-18 DIAGNOSIS — E875 Hyperkalemia: Secondary | ICD-10-CM

## 2014-12-18 DIAGNOSIS — C649 Malignant neoplasm of unspecified kidney, except renal pelvis: Secondary | ICD-10-CM

## 2014-12-18 DIAGNOSIS — N289 Disorder of kidney and ureter, unspecified: Secondary | ICD-10-CM

## 2014-12-18 DIAGNOSIS — N189 Chronic kidney disease, unspecified: Principal | ICD-10-CM

## 2014-12-18 LAB — CBC WITH DIFFERENTIAL/PLATELET
BASO%: 0.9 % (ref 0.0–2.0)
Basophils Absolute: 0.1 10*3/uL (ref 0.0–0.1)
EOS%: 3.5 % (ref 0.0–7.0)
Eosinophils Absolute: 0.2 10*3/uL (ref 0.0–0.5)
HCT: 35.6 % — ABNORMAL LOW (ref 38.4–49.9)
HGB: 11.7 g/dL — ABNORMAL LOW (ref 13.0–17.1)
LYMPH%: 23.1 % (ref 14.0–49.0)
MCH: 32.8 pg (ref 27.2–33.4)
MCHC: 32.9 g/dL (ref 32.0–36.0)
MCV: 99.7 fL — ABNORMAL HIGH (ref 79.3–98.0)
MONO#: 0.6 10*3/uL (ref 0.1–0.9)
MONO%: 10.2 % (ref 0.0–14.0)
NEUT%: 62.3 % (ref 39.0–75.0)
NEUTROS ABS: 3.5 10*3/uL (ref 1.5–6.5)
PLATELETS: 110 10*3/uL — AB (ref 140–400)
RBC: 3.57 10*6/uL — ABNORMAL LOW (ref 4.20–5.82)
RDW: 14.5 % (ref 11.0–14.6)
WBC: 5.7 10*3/uL (ref 4.0–10.3)
lymph#: 1.3 10*3/uL (ref 0.9–3.3)

## 2014-12-18 LAB — COMPREHENSIVE METABOLIC PANEL (CC13)
ALBUMIN: 3.7 g/dL (ref 3.5–5.0)
ALK PHOS: 63 U/L (ref 40–150)
ALT: 16 U/L (ref 0–55)
AST: 23 U/L (ref 5–34)
Anion Gap: 10 mEq/L (ref 3–11)
BUN: 33.7 mg/dL — ABNORMAL HIGH (ref 7.0–26.0)
CO2: 32 mEq/L — ABNORMAL HIGH (ref 22–29)
Calcium: 10.3 mg/dL (ref 8.4–10.4)
Chloride: 101 mEq/L (ref 98–109)
Creatinine: 3.1 mg/dL (ref 0.7–1.3)
EGFR: 18 mL/min/{1.73_m2} — ABNORMAL LOW (ref >90)
GLUCOSE: 102 mg/dL (ref 70–140)
POTASSIUM: 5 meq/L (ref 3.5–5.1)
SODIUM: 143 meq/L (ref 136–145)
TOTAL PROTEIN: 6.4 g/dL (ref 6.4–8.3)
Total Bilirubin: 0.92 mg/dL (ref 0.20–1.20)

## 2014-12-18 NOTE — Telephone Encounter (Signed)
Gave avs & cal for Feb. °

## 2014-12-18 NOTE — Progress Notes (Signed)
  [place] OFFICE PROGRESS NOTE   Diagnosis: Anemia  INTERVAL HISTORY:   Mr. Gallardo returns as scheduled. He was noted to have hyperkalemia and renal failure when he was here last month. He reports seeing [name]. He is followed by Dr. Mare Ferrari for atrial fibrillation and heart block.  On 11/27/2014 he underwent an upper endoscopy Dr. Carlean Purl for evaluation of dysphagia. A stricture was noted at the gastroesophageal junction. The stricture was dilated. He reports improvement in dysphagia.  Objective:  Vital signs in last 24 hours:  Blood pressure 113/50, pulse 42, temperature 0 F (-17.8 C), resp. rate 18, height 5\' 10"  (1.778 m), weight 152 lb 4.8 oz (69.083 kg), SpO2 99 %. repeat manual pulse 60    HEENT: Neck without mass Lymphatics: No cervical or supra-clavicular nodes. Soft mobile 1/2-170 bilateral axillary nodes. Resp: Lungs clear bilaterally Cardio: Irregular GI: No hepatomegaly, left lower quadrant colostomy Vascular: No leg edema   Lab Results:  Lab Results  Component Value Date   WBC 5.7 12/18/2014   HGB 11.7* 12/18/2014   HCT 35.6* 12/18/2014   MCV 99.7* 12/18/2014   PLT 110* 12/18/2014   NEUTROABS 3.5 12/18/2014   potassium 5.0, creatinine 3.1, BUN 33.7   Medications: I have reviewed the patient's current medications.  Assessment/Plan: 1. Rectal cancer, stage II, T3 N0, status post neoadjuvant infusional 5-fluorouracil and radiation, status post APR 06/01/2007. He completed 4 months of adjuvant Xeloda.  Colonoscopy June 2012-negative, repeat planned for 5 years 2. Anemia. Likely related to renal failure versus myelodysplasia. Improved with erythropoietin. Last treated with erythropoietin in September 2015 3. Admission with pyelonephritis 11/20/2013. 4. Chronic renal failure. 5. Mild thrombocytopenia-? Myelodysplasia,? ITP-stable 6. Anorexia/weight loss. Question etiology. 7. Dysphagia. Status post an upper endoscopy 11/27/2014 with  dilation of a gastroesophageal junction strictur  Disposition:  Mr. Hakeem will not receive erythropoietin today. He will return for an office visit and CBC in 2 months.  He is followed by [name] for management of renal failure. We will resume erythropoietin if the hemoglobin falls. Hopefully the weight loss will improve following the esophageal dilatation procedure.    Betsy Coder, MD  12/18/2014  9:21 AM

## 2014-12-25 ENCOUNTER — Encounter: Payer: Self-pay | Admitting: Podiatry

## 2014-12-25 ENCOUNTER — Ambulatory Visit (INDEPENDENT_AMBULATORY_CARE_PROVIDER_SITE_OTHER): Payer: Medicare Other | Admitting: Podiatry

## 2014-12-25 VITALS — BP 109/60 | HR 84 | Resp 12

## 2014-12-25 DIAGNOSIS — B351 Tinea unguium: Secondary | ICD-10-CM

## 2014-12-25 DIAGNOSIS — M79676 Pain in unspecified toe(s): Secondary | ICD-10-CM

## 2014-12-25 DIAGNOSIS — Q828 Other specified congenital malformations of skin: Secondary | ICD-10-CM

## 2014-12-25 NOTE — Progress Notes (Signed)
Patient ID: Nicholas [name], male   DOB: 09/22/1934, 80 y.o.   MRN: [id number]  Subjective: This patient presents again complaining of painful toenails and painful plantar keratoses.  Objective: The toenails are incurvated, elongated, discolored and tender to palpation 6-10 Nucleated plantar plantar keratoses sub-first and fifth MPJ right and first MPJ left  Assessment: Symptomatic onychomycoses 6-10 Porokeratosis 3  Plan: Debrided toenails 10 and keratoses 3 without a bleeding  Reappoint 3 months

## 2014-12-26 ENCOUNTER — Ambulatory Visit (INDEPENDENT_AMBULATORY_CARE_PROVIDER_SITE_OTHER): Payer: Medicare Other | Admitting: Surgery

## 2014-12-26 DIAGNOSIS — Z5181 Encounter for therapeutic drug level monitoring: Secondary | ICD-10-CM

## 2014-12-26 DIAGNOSIS — I4891 Unspecified atrial fibrillation: Secondary | ICD-10-CM

## 2014-12-26 LAB — POCT INR: INR: 1.8

## 2015-01-06 ENCOUNTER — Encounter: Payer: Medicare Other | Admitting: Internal Medicine

## 2015-01-09 ENCOUNTER — Ambulatory Visit (INDEPENDENT_AMBULATORY_CARE_PROVIDER_SITE_OTHER): Payer: Medicare Other | Admitting: *Deleted

## 2015-01-09 DIAGNOSIS — I4891 Unspecified atrial fibrillation: Secondary | ICD-10-CM

## 2015-01-09 DIAGNOSIS — Z5181 Encounter for therapeutic drug level monitoring: Secondary | ICD-10-CM

## 2015-01-09 LAB — POCT INR: INR: 1.9

## 2015-01-24 ENCOUNTER — Ambulatory Visit (INDEPENDENT_AMBULATORY_CARE_PROVIDER_SITE_OTHER): Payer: Medicare Other | Admitting: *Deleted

## 2015-01-24 DIAGNOSIS — Z5181 Encounter for therapeutic drug level monitoring: Secondary | ICD-10-CM

## 2015-01-24 DIAGNOSIS — I4891 Unspecified atrial fibrillation: Secondary | ICD-10-CM

## 2015-01-24 LAB — POCT INR: INR: 3.2

## 2015-01-31 ENCOUNTER — Ambulatory Visit (INDEPENDENT_AMBULATORY_CARE_PROVIDER_SITE_OTHER): Payer: Medicare Other | Admitting: *Deleted

## 2015-01-31 DIAGNOSIS — Z5181 Encounter for therapeutic drug level monitoring: Secondary | ICD-10-CM

## 2015-01-31 DIAGNOSIS — I4891 Unspecified atrial fibrillation: Secondary | ICD-10-CM

## 2015-01-31 LAB — POCT INR: INR: 2

## 2015-02-03 ENCOUNTER — Encounter: Payer: Self-pay | Admitting: Internal Medicine

## 2015-02-03 ENCOUNTER — Ambulatory Visit (INDEPENDENT_AMBULATORY_CARE_PROVIDER_SITE_OTHER): Payer: Medicare Other | Admitting: Internal Medicine

## 2015-02-03 VITALS — BP 122/64 | HR 78 | Ht 70.0 in | Wt 151.0 lb

## 2015-02-03 DIAGNOSIS — Z950 Presence of cardiac pacemaker: Secondary | ICD-10-CM

## 2015-02-03 DIAGNOSIS — I482 Chronic atrial fibrillation, unspecified: Secondary | ICD-10-CM

## 2015-02-03 DIAGNOSIS — I4891 Unspecified atrial fibrillation: Secondary | ICD-10-CM

## 2015-02-03 DIAGNOSIS — I493 Ventricular premature depolarization: Secondary | ICD-10-CM

## 2015-02-03 DIAGNOSIS — I10 Essential (primary) hypertension: Secondary | ICD-10-CM

## 2015-02-03 MED ORDER — METOPROLOL SUCCINATE ER 50 MG PO TB24: ORAL_TABLET | ORAL | Status: DC

## 2015-02-03 NOTE — Patient Instructions (Signed)
Your physician recommends that you schedule a follow-up appointment in: 3 months with Chanetta Marshall, NP  Your physician has recommended you make the following change in your medication:  1) Increase Toprol to 75 mg daily

## 2015-02-03 NOTE — Progress Notes (Signed)
Electrophysiology Office Note   Date:  02/03/2015   ID:  Nicholas [name], DOB 01-05-1934, MRN [id number]  PCP:  Leonard Downing, MD  Cardiologist:  Dr Mare Ferrari Primary Electrophysiologist: Thompson Grayer, MD    Chief Complaint  Patient presents with  . Follow-up    PAF & CHB     History of Present Illness: Nicholas [name] is a 81 y.o. male who presents today for electrophysiology evaluation.   He has done well since his BiV pacemaker implant.  His edema has improved.  Fatigue and SOB are also better.  He continues to have tachypalpitations.  These are more prominent at night. Today, he denies symptoms of chest pain, shortness of breath, orthopnea, PND, claudication, dizziness, presyncope, syncope, bleeding, or neurologic sequela. The patient is tolerating medications without difficulties and is otherwise without complaint today.    Past Medical History  Diagnosis Date  . 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 Pure hypercholesterolemia     takes Vytorin daily  . Personal history of colonic polyps     adenomatous  . Diverticulosis of colon (without mention of hemorrhage)   . Cardiovascular disease   . IHD (ischemic heart disease)   . History of colon cancer   . Colon cancer   . Hypertension     takes Amlodipine,Metoprolol,and Cardura daily  . Coronary artery disease   . Acute myocardial infarction, unspecified site, episode of care unspecified   . MI (myocardial infarction) 1993  . CHF (congestive heart failure)     takes Lasix daily  . H/O blood clots 1993  . Dysrhythmia   . Shortness of breath     with exertion  . Arthritis   . Joint pain   . Joint swelling   . Edema     to right leg-below knee;not any different than when he saw brackbill in jan 2013  . Bruises easily     takes ASA daily  . Skin cancer     nose  . GERD (gastroesophageal reflux disease)     takes Protonix daily  . Diarrhea   . Constipation   . Enlarged prostate     takes Finasteride daily   . Blood transfusion 2008  . Insomnia   . Hx of CABG     1993  . Ejection fraction     EF 51%, nuclear, 2010  . Pyelonephritis 11/20/2013  . CKD (chronic kidney disease) stage 4, GFR 15-29 ml/min    Past Surgical History  Procedure Laterality Date  . Shoulder surgery  2007    left  . Cataract extraction      right  . Abdominoperineal proctocolectomy  06/01/2007  . Coronary angioplasty  05/22/92  . Colostomy  2008  . Colonoscopy    . Coronary artery bypass graft  1993  . Colon surgery  2008    colectomy  . Knee arthroscopy  2010    right   . Total knee arthroplasty  02/28/2012    Procedure: TOTAL KNEE ARTHROPLASTY;  Surgeon: Rudean Haskell, MD;  Location: [facility];  Service: Orthopedics;  Laterality: Right;  . Pacemaker placement  08/2014  . Bi-ventricular pacemaker insertion N/A 10/01/2014    SJM Allure Quadra implnated by Dr Rayann Heman for symptomatic bradycadria     Current Outpatient Prescriptions  Medication Sig Dispense Refill  . feeding supplement, ENSURE COMPLETE, (ENSURE COMPLETE) LIQD Take 237 mLs by mouth daily.    . ferrous sulfate 325 (65 FE) MG tablet Take  325 mg by mouth 2 (two) times daily with a meal.     . finasteride (PROSCAR) 5 MG tablet Take 5 mg by mouth daily.      . furosemide (LASIX) 40 MG tablet Take 1 tablet (40 mg total) by mouth daily.    . hydrALAZINE (APRESOLINE) 25 MG tablet Take 1 tablet (25 mg total) by mouth 2 (two) times daily. 60 tablet 5  . isosorbide dinitrate (ISORDIL) 20 MG tablet Take 10 mg by mouth 2 (two) times daily.    . metoprolol succinate (TOPROL-XL) 50 MG 24 hr tablet Take 50 mg by mouth daily. Take with or immediately following a meal.    . nitroGLYCERIN (NITROSTAT) 0.4 MG SL tablet Place 1 tablet (0.4 mg total) under the tongue every 5 (five) minutes as needed. For chest pain 25 tablet 6  . pantoprazole (PROTONIX) 40 MG tablet Take 1 tablet (40 mg total) by mouth daily. 90 tablet 3  . simvastatin (ZOCOR) 20 MG tablet Take 1 tablet (20  mg total) by mouth at bedtime. 90 tablet 3  . tamsulosin (FLOMAX) 0.4 MG CAPS capsule Take 1 capsule (0.4 mg total) by mouth daily after supper. 30 capsule 1  . vitamin C (ASCORBIC ACID) 500 MG tablet Take 500 mg by mouth daily.      . Vitamins-Lipotropics (B-100 COMPLEX) TABS Take 1 tablet by mouth daily.     Marland Kitchen warfarin (COUMADIN) 3 MG tablet Take 3-4.5 mg by mouth daily. 4.5mg  on Wednesdays all other days take 3mg      No current facility-administered medications for this visit.    Allergies:   Cephalexin; Ciprofloxacin; Hctz; Levofloxacin; Lipitor; Niaspan; Oxycodone-acetaminophen; and Sulfonamide derivatives   Social History:  The patient  reports that he quit smoking about 23 years ago. His smoking use included Pipe and Cigars. His smokeless tobacco use includes Chew. He reports that he does not drink alcohol or use illicit drugs.   Family History:  The patient's family history includes Diabetes in his son; Emphysema in his mother; Pancreatic cancer in his father. There is no history of Anesthesia problems, Hypotension, Malignant hyperthermia, or Pseudochol deficiency.    ROS:  Please see the history of present illness.   All other systems are reviewed and negative.    PHYSICAL EXAM: VS:  BP 122/64 mmHg  Pulse 78  Ht 5\' 10"  (1.778 m)  Wt 151 lb (68.493 kg)  BMI 21.67 kg/m2 , BMI Body mass index is 21.67 kg/(m^2). GEN:  elderly, in no acute distress HEENT: normal Neck: no JVD, carotid bruits, or masses Cardiac: iRRR  Respiratory:  clear to auscultation bilaterally, normal work of breathing GI: soft, nontender, nondistended, + BS MS: no deformity or atrophy Skin: warm and dry, device pocket is well healed Neuro:  Strength and sensation are intact Psych: euthymic mood, full affect  EKG:  EKG is ordered today. The ekg ordered today shows afib with BiV pacing and frequent PVCs  Device interrogation is reviewed today in detail.  See PaceArt for details.   Recent  Labs: 09/27/2014: Magnesium 2.1; Pro B Natriuretic peptide (BNP) 7803.0*; TSH 1.540 12/18/2014: ALT 16; BUN 33.7*; Creatinine 3.1*; Hemoglobin 11.7*; Platelets 110*; Potassium 5.0; Sodium 143    Lipid Panel     Component Value Date/Time   CHOL 126 12/12/2014 0742   TRIG 83.0 12/12/2014 0742   HDL 39.20 12/12/2014 0742   CHOLHDL 3 12/12/2014 0742   VLDL 16.6 12/12/2014 0742   LDLCALC 70 12/12/2014 0742  Wt Readings from Last 3 Encounters:  02/03/15 151 lb (68.493 kg)  12/18/14 152 lb 4.8 oz (69.083 kg)  12/12/14 154 lb (69.854 kg)      Other studies Reviewed: Additional studies/ records that were reviewed today include: Dr Bobbye Riggs last note    ASSESSMENT AND PLAN:  1.  Chronic systolic dysfunction Normal BiV pacemaker function See Pace Art report No changes today Only BiV paced 64% due to frequent ventricular ectopy and afib Increase toprol to 75mg  daily today Could increase toprol to 100mg  daily when he sees Dr Mare Ferrari if BP allows  2. Permanent afib Continue long term anticoagulation  3. Symptomatic bradycardia Normal pacemaker function  4. HTN Stable No change required today  5. CAD No ischemic changes Stable No change required today    Current medicines are reviewed at length with the patient today.   The patient does not have concerns regarding his medicines.  The following changes were made today:  none   Follow-up with Dr Mare Ferrari as scheduled Return to see Chanetta Marshall NP in the device clinic in 3 months to follow-up on %  BiV pacing Merlin compliant  Signed, Thompson Grayer, MD  02/03/2015 12:21 PM     [address] [place] [id number] [phone] (office) [phone] (fax)

## 2015-02-04 LAB — MDC_IDC_ENUM_SESS_TYPE_INCLINIC
Battery Voltage: 2.98 V
Brady Statistic RA Percent Paced: 0 %
Brady Statistic RV Percent Paced: 64 %
Lead Channel Impedance Value: 462.5 Ohm
Lead Channel Impedance Value: 737.5 Ohm
Lead Channel Pacing Threshold Amplitude: 0.5 V
Lead Channel Pacing Threshold Amplitude: 0.5 V
Lead Channel Pacing Threshold Amplitude: 1.25 V
Lead Channel Pacing Threshold Pulse Width: 0.5 ms
Lead Channel Setting Pacing Amplitude: 2.25 V
Lead Channel Setting Pacing Amplitude: 2.5 V
Lead Channel Setting Pacing Pulse Width: 0.5 ms
Lead Channel Setting Sensing Sensitivity: 2 mV
MDC IDC MSMT BATTERY REMAINING LONGEVITY: 98.4 mo
MDC IDC MSMT LEADCHNL LV PACING THRESHOLD AMPLITUDE: 1.25 V
MDC IDC MSMT LEADCHNL LV PACING THRESHOLD PULSEWIDTH: 0.5 ms
MDC IDC MSMT LEADCHNL LV PACING THRESHOLD PULSEWIDTH: 0.5 ms
MDC IDC MSMT LEADCHNL RV PACING THRESHOLD PULSEWIDTH: 0.5 ms
MDC IDC MSMT LEADCHNL RV SENSING INTR AMPL: 12 mV
MDC IDC PG MODEL: 3242
MDC IDC PG SERIAL: 7667871
MDC IDC SESS DTM: 20160208170111
MDC IDC SET LEADCHNL LV PACING PULSEWIDTH: 0.5 ms

## 2015-02-12 ENCOUNTER — Ambulatory Visit: Payer: Medicare Other

## 2015-02-12 ENCOUNTER — Ambulatory Visit (HOSPITAL_BASED_OUTPATIENT_CLINIC_OR_DEPARTMENT_OTHER): Payer: Medicare Other | Admitting: Physician Assistant

## 2015-02-12 ENCOUNTER — Telehealth: Payer: Self-pay | Admitting: Oncology

## 2015-02-12 ENCOUNTER — Encounter: Payer: Self-pay | Admitting: Physician Assistant

## 2015-02-12 ENCOUNTER — Telehealth: Payer: Self-pay | Admitting: *Deleted

## 2015-02-12 ENCOUNTER — Other Ambulatory Visit (HOSPITAL_BASED_OUTPATIENT_CLINIC_OR_DEPARTMENT_OTHER): Payer: Medicare Other

## 2015-02-12 VITALS — BP 103/57 | HR 93 | Temp 97.7°F | Resp 20 | Ht 70.0 in | Wt 149.5 lb

## 2015-02-12 DIAGNOSIS — N189 Chronic kidney disease, unspecified: Principal | ICD-10-CM

## 2015-02-12 DIAGNOSIS — Z85048 Personal history of other malignant neoplasm of rectum, rectosigmoid junction, and anus: Secondary | ICD-10-CM

## 2015-02-12 DIAGNOSIS — D649 Anemia, unspecified: Secondary | ICD-10-CM

## 2015-02-12 DIAGNOSIS — N179 Acute kidney failure, unspecified: Secondary | ICD-10-CM

## 2015-02-12 DIAGNOSIS — N289 Disorder of kidney and ureter, unspecified: Secondary | ICD-10-CM

## 2015-02-12 DIAGNOSIS — N184 Chronic kidney disease, stage 4 (severe): Secondary | ICD-10-CM

## 2015-02-12 LAB — CBC WITH DIFFERENTIAL/PLATELET
BASO%: 0.3 % (ref 0.0–2.0)
BASOS ABS: 0 10*3/uL (ref 0.0–0.1)
EOS ABS: 0.2 10*3/uL (ref 0.0–0.5)
EOS%: 3 % (ref 0.0–7.0)
HEMATOCRIT: 34 % — AB (ref 38.4–49.9)
HEMOGLOBIN: 11.1 g/dL — AB (ref 13.0–17.1)
LYMPH#: 1 10*3/uL (ref 0.9–3.3)
LYMPH%: 12.9 % — AB (ref 14.0–49.0)
MCH: 33.4 pg (ref 27.2–33.4)
MCHC: 32.6 g/dL (ref 32.0–36.0)
MCV: 102.4 fL — ABNORMAL HIGH (ref 79.3–98.0)
MONO#: 0.6 10*3/uL (ref 0.1–0.9)
MONO%: 8.3 % (ref 0.0–14.0)
NEUT#: 5.8 10*3/uL (ref 1.5–6.5)
NEUT%: 75.5 % — ABNORMAL HIGH (ref 39.0–75.0)
PLATELETS: 96 10*3/uL — AB (ref 140–400)
RBC: 3.32 10*6/uL — ABNORMAL LOW (ref 4.20–5.82)
RDW: 14 % (ref 11.0–14.6)
WBC: 7.7 10*3/uL (ref 4.0–10.3)

## 2015-02-12 LAB — BASIC METABOLIC PANEL (CC13)
Anion Gap: 10 mEq/L (ref 3–11)
BUN: 40.5 mg/dL — ABNORMAL HIGH (ref 7.0–26.0)
CALCIUM: 10.5 mg/dL — AB (ref 8.4–10.4)
CO2: 28 meq/L (ref 22–29)
CREATININE: 3 mg/dL — AB (ref 0.7–1.3)
Chloride: 107 mEq/L (ref 98–109)
EGFR: 19 mL/min/{1.73_m2} — AB (ref >90)
Glucose: 124 mg/dl (ref 70–140)
Potassium: 5.3 mEq/L — ABNORMAL HIGH (ref 3.5–5.1)
SODIUM: 144 meq/L (ref 136–145)

## 2015-02-12 MED ORDER — EPOETIN ALFA 20000 UNIT/ML IJ SOLN: 20000.0000 [IU] | Freq: Once | INTRAMUSCULAR | Status: DC

## 2015-02-12 NOTE — Telephone Encounter (Signed)
Received notification from [facility] lab critical value Creatine 3.0.  Dr. Benay Spice made aware and routed BMET results to [name].

## 2015-02-12 NOTE — Progress Notes (Signed)
  [facility] OFFICE PROGRESS NOTE   Diagnosis: Anemia  INTERVAL HISTORY:   Nicholas [name] returns as scheduled. Overall he continues to do well. He recently was seen by ear nose and throat specialist and had a "tube" place in his right ear. He did notice some drainage on his pillow that was little bloody this morning. He has a follow-up with an ENT specialist in one month. He also reports that he is to see Dr. Mare Ferrari, his cardiologist, on 03/05/2015 for follow-up. He had a pacemaker placed in October 2015. He reports that his energy level is fair. He is able to do his activities of daily living without significant compromise. He is also scheduled to see his nephrologist towards the end of this month for follow-up as well.   On 11/27/2014 he underwent an upper endoscopy Dr. Carlean Purl for evaluation of dysphagia. A stricture was noted at the gastroesophageal junction. The stricture was dilated. He reports improvement in dysphagia.  Objective:  Vital signs in last 24 hours:  Blood pressure 103/57, pulse 93, temperature 97.7 F (36.5 C), temperature source Oral, resp. rate 20, height 5\' 10"  (1.778 m), weight 149 lb 8 oz (67.813 kg). repeat manual pulse 60    HEENT: no thrush or ulcers. Mucous membranes appear moist. There is a dry, packing in the right ear Lymphatics: no palpable cervical, supraclavicular or inguinal lymph nodes. Soft, mobile small axillary lymph nodes bilaterally. Resp: lungs clear bilaterally. Cardio: irregular. GI: abdomen soft and nontender. No hepatomegaly. Left lower quadrant colostomy. No nodularity at the perineum. Vascular: no leg edema. Neuro:alert and oriented.     Lab Results:  Lab Results  Component Value Date   WBC 7.7 02/12/2015   HGB 11.1* 02/12/2015   HCT 34.0* 02/12/2015   MCV 102.4* 02/12/2015   PLT 96* 02/12/2015   NEUTROABS 5.8 02/12/2015   potassium 5.0, creatinine 3.1, BUN 33.7   Medications: I have reviewed the patient's  current medications.  Assessment/Plan: 1. Rectal cancer, stage II, T3 N0, status post neoadjuvant infusional 5-fluorouracil and radiation, status post APR 06/01/2007. He completed 4 months of adjuvant Xeloda.  Colonoscopy June 2012-negative, repeat planned for 5 years 2. Anemia. Likely related to renal failure versus myelodysplasia. Improved with erythropoietin. Last treated with erythropoietin in September 2015 3. Admission with pyelonephritis 11/20/2013. 4. Chronic renal failure. 5. Mild thrombocytopenia-? Myelodysplasia,? ITP-stable 6. Anorexia/weight loss. Question etiology. 7. Dysphagia. Status post an upper endoscopy 11/27/2014 with dilation of a gastroesophageal junction strictur  Disposition:  Nicholas [name] will not receive erythropoietin today. He will return for an office visit and CBC in 2 months.  He is followed by [name] for management of renal failure. We will resume erythropoietin if the hemoglobin falls. He is advised to keep his follow-up appointments with his cardiologist, nephrologist an ENT specialist as scheduled or as needed.   Carlton Adam, PA-C  02/12/2015  11:53 AM

## 2015-02-12 NOTE — Telephone Encounter (Signed)
gv and printed appt sched and avs for pt for Feb thru April °

## 2015-02-12 NOTE — Patient Instructions (Signed)
Follow up in 2 months

## 2015-02-14 ENCOUNTER — Ambulatory Visit (INDEPENDENT_AMBULATORY_CARE_PROVIDER_SITE_OTHER): Payer: Medicare Other | Admitting: *Deleted

## 2015-02-14 DIAGNOSIS — I4891 Unspecified atrial fibrillation: Secondary | ICD-10-CM

## 2015-02-14 DIAGNOSIS — Z5181 Encounter for therapeutic drug level monitoring: Secondary | ICD-10-CM

## 2015-02-14 LAB — POCT INR: INR: 3

## 2015-03-05 ENCOUNTER — Ambulatory Visit (INDEPENDENT_AMBULATORY_CARE_PROVIDER_SITE_OTHER): Payer: Medicare Other | Admitting: Cardiology

## 2015-03-05 ENCOUNTER — Encounter: Payer: Self-pay | Admitting: Cardiology

## 2015-03-05 ENCOUNTER — Other Ambulatory Visit: Payer: Medicare Other

## 2015-03-05 ENCOUNTER — Other Ambulatory Visit: Payer: Self-pay | Admitting: *Deleted

## 2015-03-05 ENCOUNTER — Ambulatory Visit (INDEPENDENT_AMBULATORY_CARE_PROVIDER_SITE_OTHER): Payer: Medicare Other | Admitting: *Deleted

## 2015-03-05 VITALS — BP 124/60 | HR 55 | Ht 70.0 in | Wt 142.8 lb

## 2015-03-05 DIAGNOSIS — I482 Chronic atrial fibrillation, unspecified: Secondary | ICD-10-CM

## 2015-03-05 DIAGNOSIS — E7800 Pure hypercholesterolemia, unspecified: Secondary | ICD-10-CM

## 2015-03-05 DIAGNOSIS — R634 Abnormal weight loss: Secondary | ICD-10-CM

## 2015-03-05 DIAGNOSIS — I10 Essential (primary) hypertension: Secondary | ICD-10-CM

## 2015-03-05 DIAGNOSIS — Z5181 Encounter for therapeutic drug level monitoring: Secondary | ICD-10-CM

## 2015-03-05 DIAGNOSIS — I493 Ventricular premature depolarization: Secondary | ICD-10-CM

## 2015-03-05 DIAGNOSIS — I4891 Unspecified atrial fibrillation: Secondary | ICD-10-CM

## 2015-03-05 LAB — CBC WITH DIFFERENTIAL/PLATELET
Basophils Absolute: 0 10*3/uL (ref 0.0–0.1)
Basophils Relative: 0.2 % (ref 0.0–3.0)
EOS ABS: 0.2 10*3/uL (ref 0.0–0.7)
EOS PCT: 2 % (ref 0.0–5.0)
HEMATOCRIT: 38.7 % — AB (ref 39.0–52.0)
HEMOGLOBIN: 13 g/dL (ref 13.0–17.0)
Lymphocytes Relative: 13.3 % (ref 12.0–46.0)
Lymphs Abs: 1.1 10*3/uL (ref 0.7–4.0)
MCHC: 33.6 g/dL (ref 30.0–36.0)
MCV: 99 fl (ref 78.0–100.0)
MONO ABS: 0.6 10*3/uL (ref 0.1–1.0)
Monocytes Relative: 7.5 % (ref 3.0–12.0)
Neutro Abs: 6.2 10*3/uL (ref 1.4–7.7)
Neutrophils Relative %: 77 % (ref 43.0–77.0)
PLATELETS: 122 10*3/uL — AB (ref 150.0–400.0)
RBC: 3.91 Mil/uL — AB (ref 4.22–5.81)
RDW: 14.3 % (ref 11.5–15.5)
WBC: 8.1 10*3/uL (ref 4.0–10.5)

## 2015-03-05 LAB — BASIC METABOLIC PANEL
BUN: 41 mg/dL — ABNORMAL HIGH (ref 6–23)
CALCIUM: 11.1 mg/dL — AB (ref 8.4–10.5)
CO2: 30 meq/L (ref 19–32)
CREATININE: 2.82 mg/dL — AB (ref 0.40–1.50)
Chloride: 104 mEq/L (ref 96–112)
GFR: 23.17 mL/min — AB (ref >60.00)
Glucose, Bld: 104 mg/dL — ABNORMAL HIGH (ref 70–99)
POTASSIUM: 5.4 meq/L — AB (ref 3.5–5.1)
SODIUM: 139 meq/L (ref 135–145)

## 2015-03-05 LAB — POCT INR: INR: 3.9

## 2015-03-05 LAB — LIPID PANEL
Cholesterol: 120 mg/dL (ref 0–200)
HDL: 49.6 mg/dL (ref >39.00)
LDL CALC: 48 mg/dL (ref 0–99)
NonHDL: 70.4
Total CHOL/HDL Ratio: 2
Triglycerides: 110 mg/dL (ref 0.0–149.0)
VLDL: 22 mg/dL (ref 0.0–40.0)

## 2015-03-05 LAB — HEPATIC FUNCTION PANEL
ALBUMIN: 4.5 g/dL (ref 3.5–5.2)
ALT: 16 U/L (ref 0–53)
AST: 23 U/L (ref 0–37)
Alkaline Phosphatase: 62 U/L (ref 39–117)
BILIRUBIN DIRECT: 0.2 mg/dL (ref 0.0–0.3)
Total Bilirubin: 0.8 mg/dL (ref 0.2–1.2)
Total Protein: 7.4 g/dL (ref 6.0–8.3)

## 2015-03-05 NOTE — Patient Instructions (Addendum)
Will obtain labs today and call you with the results (LP/BMET/HFP/CBC)  Your physician recommends that you continue on your current medications as directed. Please refer to the Current Medication list given to you today.  Your physician recommends that you schedule a follow-up appointment in: 3 months with fasting labs (LP/BMET/HFP/CBC) AND EKG

## 2015-03-05 NOTE — Progress Notes (Signed)
Cardiology Office Note   Date:  03/05/2015   ID:  Nicholas [name], DOB 1934/02/02, MRN [id number]  PCP:  Leonard Downing, MD  Cardiologist:   Darlin Coco, MD   No chief complaint on file.     History of Present Illness: Nicholas [name] is a 81 y.o. male who presents for 3 month follow-up visit.  This pleasant 81 year old gentleman is seen for a followup office visit. He has a past history of ischemic heart disease and had coronary artery bypass graft surgery on 05/27/92. He has a past history of compensated congestive heart failure. He had a nuclear stress test in June 2010 showing an old inferior scar but no reversible ischemia and his ejection fraction was 51%. . When Dr. Ron Parker saw him as a work in in July 2014 his EKG showed atrial fibrillation which was new since 2013. His most recent echocardiogram on 07/31/13 showed normal systolic function with ejection fraction 55% and there was inferior wall akinesis. There was biatrial enlargement. There was mild to moderate mitral regurgitation and his pulmonary artery pressure was elevated at 46-50 mm mercury.  The patient was hospitalized over Thanksgiving 2014 with acute renal failure. Because his GFR was less than 15 he was no longer able to take Xarelto and he was placed on just a baby aspirin for his atrial fibrillation. He was found to have a nonfunctioning right kidney and he had a history of passage of a kidney stone with hematuria. His creatinine was 5.3 at discharge from the hospital. He is being followed by nephrology. He is now on warfarin for his atrial fibrillation. Since last visit he has been doing well. He has had no chest pain. He does have some exertional dyspnea. He has a past history of colon cancer and has a functioning colostomy. In October 2015 the patient presented with high-grade AV block and underwent insertion of a Medtronic biventricular pacemaker on 10/01/14 by Dr. Rayann Heman. His dyspnea has been improved since  his biventricular pacemaker.  He previously was having sensation of rapid palpitations which improved when Dr. Rayann Heman increased his metoprolol to 75 mg daily.  Past Medical History  Diagnosis Date  . 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 Pure hypercholesterolemia     takes Vytorin daily  . Personal history of colonic polyps     adenomatous  . Diverticulosis of colon (without mention of hemorrhage)   . Cardiovascular disease   . IHD (ischemic heart disease)   . History of colon cancer   . Colon cancer   . Hypertension     takes Amlodipine,Metoprolol,and Cardura daily  . Coronary artery disease   . Acute myocardial infarction, unspecified site, episode of care unspecified   . MI (myocardial infarction) 1993  . CHF (congestive heart failure)     takes Lasix daily  . H/O blood clots 1993  . Dysrhythmia   . Shortness of breath     with exertion  . Arthritis   . Joint pain   . Joint swelling   . Edema     to right leg-below knee;not any different than when he saw Almadelia Looman in jan 2013  . Bruises easily     takes ASA daily  . Skin cancer     nose  . GERD (gastroesophageal reflux disease)     takes Protonix daily  . Diarrhea   . Constipation   . Enlarged prostate     takes Finasteride daily  . Blood transfusion  2008  . Insomnia   . Hx of CABG     1993  . Ejection fraction     EF 51%, nuclear, 2010  . Pyelonephritis 11/20/2013  . CKD (chronic kidney disease) stage 4, GFR 15-29 ml/min     Past Surgical History  Procedure Laterality Date  . Shoulder surgery  2007    left  . Cataract extraction      right  . Abdominoperineal proctocolectomy  06/01/2007  . Coronary angioplasty  05/22/92  . Colostomy  2008  . Colonoscopy    . Coronary artery bypass graft  1993  . Colon surgery  2008    colectomy  . Knee arthroscopy  2010    right   . Total knee arthroplasty  02/28/2012    Procedure: TOTAL KNEE ARTHROPLASTY;  Surgeon: Rudean Haskell, MD;  Location: [facility];  Service:  Orthopedics;  Laterality: Right;  . Pacemaker placement  08/2014  . Bi-ventricular pacemaker insertion N/A 10/01/2014    SJM Allure Quadra implnated by Dr Rayann Heman for symptomatic bradycadria     Current Outpatient Prescriptions  Medication Sig Dispense Refill  . feeding supplement, ENSURE COMPLETE, (ENSURE COMPLETE) LIQD Take 237 mLs by mouth daily.    . ferrous sulfate 325 (65 FE) MG tablet Take 325 mg by mouth 2 (two) times daily with a meal.     . finasteride (PROSCAR) 5 MG tablet Take 5 mg by mouth daily.      . furosemide (LASIX) 40 MG tablet Take 1 tablet (40 mg total) by mouth daily.    . hydrALAZINE (APRESOLINE) 25 MG tablet Take 1 tablet (25 mg total) by mouth 2 (two) times daily. 60 tablet 5  . isosorbide dinitrate (ISORDIL) 20 MG tablet Take 10 mg by mouth 2 (two) times daily.    . metoprolol succinate (TOPROL-XL) 50 MG 24 hr tablet Take 1 1/2 tablets by mouth daily with or immediately following a meal. 135 tablet 3  . nitroGLYCERIN (NITROSTAT) 0.4 MG SL tablet Place 1 tablet (0.4 mg total) under the tongue every 5 (five) minutes as needed. For chest pain 25 tablet 6  . simvastatin (ZOCOR) 20 MG tablet Take 1 tablet (20 mg total) by mouth at bedtime. 90 tablet 3  . tamsulosin (FLOMAX) 0.4 MG CAPS capsule Take 1 capsule (0.4 mg total) by mouth daily after supper. 30 capsule 1  . vitamin C (ASCORBIC ACID) 500 MG tablet Take 500 mg by mouth daily.      . Vitamins-Lipotropics (B-100 COMPLEX) TABS Take 1 tablet by mouth daily.     Marland Kitchen warfarin (COUMADIN) 3 MG tablet Take 3-4.5 mg by mouth daily. 4.5mg  on Wednesdays all other days take 3mg      No current facility-administered medications for this visit.    Allergies:   Cephalexin; Ciprofloxacin; Hctz; Levofloxacin; Lipitor; Niaspan; Oxycodone-acetaminophen; and Sulfonamide derivatives    Social History:  The patient  reports that he quit smoking about 23 years ago. His smoking use included Pipe and Cigars. His smokeless tobacco use  includes Chew. He reports that he does not drink alcohol or use illicit drugs.   Family History:  The patient's family history includes Diabetes in his son; Emphysema in his mother; Pancreatic cancer in his father. There is no history of Anesthesia problems, Hypotension, Malignant hyperthermia, or Pseudochol deficiency.    ROS:  Please see the history of present illness.   Otherwise, review of systems are positive for none.   All other systems are reviewed and negative.  PHYSICAL EXAM: VS:  BP 124/60 mmHg  Pulse 55  Ht 5\' 10"  (1.778 m)  Wt 142 lb 12.8 oz (64.774 kg)  BMI 20.49 kg/m2 , BMI Body mass index is 20.49 kg/(m^2). GEN: Well nourished, well developed, in no acute distress HEENT: normal Neck: no JVD, carotid bruits, or masses Cardiac: Irregularly irregular.; no murmurs, rubs, or gallops,no edema  Respiratory:  clear to auscultation bilaterally, normal work of breathing GI: soft, nontender, nondistended, + BS.  Colostomy present. MS: no deformity or atrophy Skin: warm and dry, no rash Neuro:  Strength and sensation are intact Psych: euthymic mood, full affect   EKG:  EKG is not ordered today.    Recent Labs: 09/27/2014: Magnesium 2.1; Pro B Natriuretic peptide (BNP) 7803.0*; TSH 1.540 12/18/2014: ALT 16 02/12/2015: BUN 40.5*; Creatinine 3.0*; Hemoglobin 11.1*; Platelets 96*; Potassium 5.3*; Sodium 144    Lipid Panel    Component Value Date/Time   CHOL 126 12/12/2014 0742   TRIG 83.0 12/12/2014 0742   HDL 39.20 12/12/2014 0742   CHOLHDL 3 12/12/2014 0742   VLDL 16.6 12/12/2014 0742   LDLCALC 70 12/12/2014 0742      Wt Readings from Last 3 Encounters:  03/05/15 142 lb 12.8 oz (64.774 kg)  02/12/15 149 lb 8 oz (67.813 kg)  02/03/15 151 lb (68.493 kg)        ASSESSMENT AND PLAN:  1. chronic atrial fibrillation 2. ischemic heart disease status post CABG in 1993 3. 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  History of hyperkalemia secondary  to renal insufficiency. 4. Past history of colon cancer with functioning colostomy. 5. hypertensive heart disease 6. Hypercholesterolemia 7. Status post Medtronic biventricular pacemaker on 10/01/14 for high-grade AV block and permanent atrial fibrillation 8.  Weight loss  Disposition: Continue same medication. Recheck in 3 months for office visit CBC lipid panel hepatic function panel and basal metabolic panel and EKG.   Current medicines are reviewed at length with the patient today.  The patient does not have concerns regarding medicines.  The following changes have been made:  no change    Orders Placed This Encounter  Procedures  . Lipid panel  . Basic metabolic panel  . Hepatic function panel  . CBC with Differential/Platelet  . CBC with Differential/Platelet     Disposition:   FU with Dr. Mare Ferrari in 3 months office visit and EKG and fasting lab work and CBC. He attributes his weight loss to the fact that he is no longer eating potatoes because of his problem with elevated potassium.  He will discuss further with his nephrologist at next visit.   Signed, Darlin Coco, MD  03/05/2015 1:22 PM    [facility] [address], [place], [place]  [address] Phone: [phone]; Fax: [phone]

## 2015-03-05 NOTE — Progress Notes (Signed)
Quick Note:  Please report to patient. The recent labs are stable. Continue same medication and careful diet. Kidneys are slightly better. Hemoglobin is better. Liver okay. ______

## 2015-03-10 ENCOUNTER — Other Ambulatory Visit: Payer: Self-pay | Admitting: Cardiology

## 2015-03-14 ENCOUNTER — Other Ambulatory Visit: Payer: Medicare Other

## 2015-03-19 ENCOUNTER — Ambulatory Visit (INDEPENDENT_AMBULATORY_CARE_PROVIDER_SITE_OTHER): Payer: Medicare Other | Admitting: *Deleted

## 2015-03-19 DIAGNOSIS — I4891 Unspecified atrial fibrillation: Secondary | ICD-10-CM | POA: Diagnosis not present

## 2015-03-19 DIAGNOSIS — Z5181 Encounter for therapeutic drug level monitoring: Secondary | ICD-10-CM | POA: Diagnosis not present

## 2015-03-19 LAB — POCT INR: INR: 4.4

## 2015-03-26 ENCOUNTER — Encounter: Payer: Self-pay | Admitting: Podiatry

## 2015-03-26 ENCOUNTER — Ambulatory Visit (INDEPENDENT_AMBULATORY_CARE_PROVIDER_SITE_OTHER): Payer: Medicare Other | Admitting: Podiatry

## 2015-03-26 DIAGNOSIS — B351 Tinea unguium: Secondary | ICD-10-CM

## 2015-03-26 DIAGNOSIS — Q828 Other specified congenital malformations of skin: Secondary | ICD-10-CM | POA: Diagnosis not present

## 2015-03-26 DIAGNOSIS — M79676 Pain in unspecified toe(s): Secondary | ICD-10-CM

## 2015-03-26 NOTE — Progress Notes (Signed)
Patient ID: Nicholas [name], male   DOB: 27-Nov-1934, 81 y.o.   MRN: [id number]  Subjective: This patient presents today complaining of painful toenails and painful plantar calluses and request debridement  Objective: The toenails are discolored, elongated, incurvated and tender to palpation 6-10 Nucleated plantar keratoses first and fifth MPJ right and plantar first left MPJ  Assessment: Symptomatic onychomycoses 6-10 Porokeratosis 3  Plan: Debridement of toenails 10 and keratoses 3 without any bleeding  Attach felt pad to patient's insole to offload first and fifth MPJ right  Reappoint 3 months

## 2015-04-02 ENCOUNTER — Ambulatory Visit (INDEPENDENT_AMBULATORY_CARE_PROVIDER_SITE_OTHER): Payer: Medicare Other | Admitting: *Deleted

## 2015-04-02 DIAGNOSIS — I4891 Unspecified atrial fibrillation: Secondary | ICD-10-CM | POA: Diagnosis not present

## 2015-04-02 DIAGNOSIS — Z5181 Encounter for therapeutic drug level monitoring: Secondary | ICD-10-CM | POA: Diagnosis not present

## 2015-04-02 LAB — POCT INR: INR: 2.7

## 2015-04-04 ENCOUNTER — Telehealth: Payer: Self-pay | Admitting: Oncology

## 2015-04-04 NOTE — Telephone Encounter (Signed)
S/w pt confirming r/s labs/ov/inj per MD schedule... KJ

## 2015-04-07 ENCOUNTER — Telehealth: Payer: Self-pay | Admitting: Cardiology

## 2015-04-07 NOTE — Telephone Encounter (Signed)
New message     Pt c/o medication issue:  1. Name of Medication: iron pill  2. How are you currently taking this medication (dosage and times per day)? Twice a day 3. Are you having a reaction (difficulty breathing--STAT)? no 4. What is your medication issue? Pt says iron pill is making him constipated.  He has a colostomy bag.  Please call

## 2015-04-07 NOTE — Telephone Encounter (Signed)
Pt advised to contact Dr Benay Spice or GI about iron and constipation.  Pt said he would do that and thanked me for my help.

## 2015-04-10 ENCOUNTER — Ambulatory Visit: Payer: Medicare Other

## 2015-04-10 ENCOUNTER — Other Ambulatory Visit: Payer: Medicare Other

## 2015-04-10 ENCOUNTER — Ambulatory Visit: Payer: Medicare Other | Admitting: Oncology

## 2015-04-11 ENCOUNTER — Encounter: Payer: Self-pay | Admitting: Neurology

## 2015-04-11 ENCOUNTER — Ambulatory Visit (INDEPENDENT_AMBULATORY_CARE_PROVIDER_SITE_OTHER): Payer: Medicare Other | Admitting: Neurology

## 2015-04-11 VITALS — BP 117/64 | HR 48 | Ht 70.0 in | Wt 135.0 lb

## 2015-04-11 DIAGNOSIS — I2510 Atherosclerotic heart disease of native coronary artery without angina pectoris: Secondary | ICD-10-CM

## 2015-04-11 DIAGNOSIS — Z951 Presence of aortocoronary bypass graft: Secondary | ICD-10-CM | POA: Diagnosis not present

## 2015-04-11 DIAGNOSIS — G7000 Myasthenia gravis without (acute) exacerbation: Secondary | ICD-10-CM

## 2015-04-11 DIAGNOSIS — I2583 Coronary atherosclerosis due to lipid rich plaque: Secondary | ICD-10-CM

## 2015-04-11 DIAGNOSIS — N184 Chronic kidney disease, stage 4 (severe): Secondary | ICD-10-CM | POA: Diagnosis not present

## 2015-04-11 MED ORDER — AZATHIOPRINE 50 MG PO TABS: ORAL_TABLET | ORAL | Status: AC

## 2015-04-11 MED ORDER — PYRIDOSTIGMINE BROMIDE 60 MG PO TABS: ORAL_TABLET | ORAL | Status: AC

## 2015-04-11 MED ORDER — PREDNISONE 10 MG PO TABS: ORAL_TABLET | ORAL | Status: AC

## 2015-04-11 NOTE — Patient Instructions (Signed)
Myasthenia Gravis Myasthenia gravis is a disease that causes muscle weakness throughout the body. The muscles affected are the ones we can control (voluntary muscles). An example of a voluntary muscle is your hand muscles. You can control the muscles to make the hand pick something up. An example of an involuntary muscle is the heart. The heart beats without any direction from you.  Myasthenia Gravis is thought to be an autoimmune disease. That means that normal defenses of the body begin to attack the body. In this case, the immune system begins to attack cells located at the junctions of the muscles and the nerves. Women are affected more often. Women are affected at a younger age than men. Babies born to affected women frequently develop symptoms at an early age. SYMPTOMS Initially in the disease, the facial muscles are affected first. After this, a person may develop droopy eyelids. They may have difficulty controlling facial muscles. They may have problems chewing. Swallowing and speaking may become impaired. The weakness gradually spreads to the arms and legs. It begins to affect breathing. Sometimes, the symptoms lessen or go away without any apparent cause. DIAGNOSIS  Diagnosis can be made with blood tests. Tests such as electromyography may be done to examine the electrical activity in the muscle. An improvement in symptoms after having an anti-cholinesterase drug helps confirm the diagnosis.  TREATMENT  Medicines are usually prescribed as the first treatment. These medicines help, but they do not cure the disease. A plasma cleansing procedure (plasmapheresis) can be used to treat a crisis. It can also be used to prepare a person for surgery. This procedure produces short-term improvement. Some cases are helped by removing the thymus gland. Steroids are used for short-term benefits. Document Released: 03/21/2001 Document Revised: 03/06/2012 Document Reviewed: 02/13/2014 [employer] Patient  Information 2015 [employer]. This information is not intended to replace advice given to you by your health care provider. Make sure you discuss any questions you have with your health care provider.  

## 2015-04-11 NOTE — Progress Notes (Signed)
PATIENT: Nicholas [name] DOB: [birth year]  HISTORICAL  Nicholas [name] is a 81 yo RH male, is accompanied by his wife, referred by his ophthalmologist Dr. Ellie Lunch and primary care physician Dr. Claris Gower for evaluation myasthenia gravis  He had a complicated past medical history, ischemic heart disease, coronary bypass surgery June 1993, history of congestive heart failure,atrial fibrillation since 2013, was previously treated with Taxol total, chronic renal failure, now is on Coumadin treatment, status post pacemaker placement October 2015 due to high-grade AV block  He also had a history of anal colon cancer, status post colectomy in 2008, with colostomy, Left shoulder injury, limited range of motion of his left shoulder, right knee replacement  At baseline, he is still fairly active, driving, take care of household.  In September 2015, he had left cataract surgery, posterior surgically, he noticed intermittent left eyelid droopy, intermittent binocular double vision, while reading, watching TV, driving,  He also noticed voice change, turn to soft, difficulty chewing, foot stuck to his throat, generalized weakness, no significant gait difficulty,  He has lost 70 pounds since October 2015 unintentionally, he was recently evaluated by his ophthalmologist Dr. Ellie Lunch, acetylcholine receptor antibody was 3.7 5, normal TSH 1.78  REVIEW OF SYSTEMS: Full 14 system review of systems performed and notable only for weight loss headaches, double vision, shortness of breath ALLERGIES: Allergies  Allergen Reactions  . Cephalexin Other (See Comments)    "too strong for my system" - per office note 05/05/2012  . Ciprofloxacin Other (See Comments)    Reaction unknown  . Hctz [Hydrochlorothiazide]     DIZZINESS   . Levofloxacin Other (See Comments)    "too strong for my system" - per office note 05/05/2012  . Lipitor [Atorvastatin Calcium]     MUSCLE PAIN  . Niaspan [Niacin Er]     FLUSHING     . Oxycodone-Acetaminophen Other (See Comments)    Reaction unknown  . Sulfonamide Derivatives Other (See Comments)    Reaction unkonwn    HOME MEDICATIONS: Current Outpatient Prescriptions  Medication Sig Dispense Refill  . feeding supplement, ENSURE COMPLETE, (ENSURE COMPLETE) LIQD Take 237 mLs by mouth daily.    . ferrous sulfate 325 (65 FE) MG tablet Take 325 mg by mouth 2 (two) times daily with a meal.     . finasteride (PROSCAR) 5 MG tablet Take 5 mg by mouth daily.      . furosemide (LASIX) 40 MG tablet Take 1 tablet (40 mg total) by mouth daily.    . hydrALAZINE (APRESOLINE) 25 MG tablet Take 1 tablet (25 mg total) by mouth 2 (two) times daily. 60 tablet 5  . isosorbide dinitrate (ISORDIL) 20 MG tablet Take 10 mg by mouth 2 (two) times daily.    . metoprolol succinate (TOPROL-XL) 50 MG 24 hr tablet Take 1 1/2 tablets by mouth daily with or immediately following a meal. 135 tablet 3  . nitroGLYCERIN (NITROSTAT) 0.4 MG SL tablet Place 1 tablet (0.4 mg total) under the tongue every 5 (five) minutes as needed. For chest pain 25 tablet 6  . simvastatin (ZOCOR) 20 MG tablet Take 1 tablet (20 mg total) by mouth at bedtime. 90 tablet 3  . tamsulosin (FLOMAX) 0.4 MG CAPS capsule Take 1 capsule (0.4 mg total) by mouth daily after supper. 30 capsule 1  . vitamin C (ASCORBIC ACID) 500 MG tablet Take 500 mg by mouth daily.      . Vitamins-Lipotropics (B-100 COMPLEX) TABS Take 1 tablet  by mouth daily.     Marland Kitchen warfarin (COUMADIN) 3 MG tablet TAKE AS DIRECTED PER COUMADIN CLINIC 40 tablet 3     PAST MEDICAL HISTORY: Past Medical History  Diagnosis Date  . 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 Pure hypercholesterolemia     takes Vytorin daily  . Personal history of colonic polyps     adenomatous  . Diverticulosis of colon (without mention of hemorrhage)   . Cardiovascular disease   . IHD (ischemic heart disease)   . History of colon cancer   . Colon cancer   . Hypertension     takes  Amlodipine,Metoprolol,and Cardura daily  . Coronary artery disease   . Acute myocardial infarction, unspecified site, episode of care unspecified   . MI (myocardial infarction) 1993  . CHF (congestive heart failure)     takes Lasix daily  . H/O blood clots 1993  . Dysrhythmia   . Shortness of breath     with exertion  . Arthritis   . Joint pain   . Joint swelling   . Edema     to right leg-below knee;not any different than when he saw brackbill in jan 2013  . Bruises easily     takes ASA daily  . Skin cancer     nose  . GERD (gastroesophageal reflux disease)     takes Protonix daily  . Diarrhea   . Constipation   . Enlarged prostate     takes Finasteride daily  . Blood transfusion 2008  . Insomnia   . Hx of CABG     1993  . Ejection fraction     EF 51%, nuclear, 2010  . Pyelonephritis 11/20/2013  . CKD (chronic kidney disease) stage 4, GFR 15-29 ml/min     PAST SURGICAL HISTORY: Past Surgical History  Procedure Laterality Date  . Shoulder surgery  2007    left  . Cataract extraction      right  . Abdominoperineal proctocolectomy  06/01/2007  . Coronary angioplasty  05/22/92  . Colostomy  2008  . Colonoscopy    . Coronary artery bypass graft  1993  . Colon surgery  2008    colectomy  . Knee arthroscopy  2010    right   . Total knee arthroplasty  02/28/2012    Procedure: TOTAL KNEE ARTHROPLASTY;  Surgeon: Rudean Haskell, MD;  Location: [facility];  Service: Orthopedics;  Laterality: Right;  . Pacemaker placement  08/2014  . Bi-ventricular pacemaker insertion N/A 10/01/2014    SJM Allure Quadra implnated by Dr Rayann Heman for symptomatic bradycadria  . Cataract extraction      FAMILY HISTORY: Family History  Problem Relation Age of Onset  . Pancreatic cancer Father   . Diabetes Son   . Emphysema Mother   . Anesthesia problems Neg Hx   . Hypotension Neg Hx   . Malignant hyperthermia Neg Hx   . Pseudochol deficiency Neg Hx     SOCIAL HISTORY:  History   Social  History  . Marital Status: Married    Spouse Name: N/A  . Number of Children: N/A  . Years of Education: N/A   Occupational History  . retired     Warehouse manager   Social History Main Topics  . Smoking status: Former Smoker -- 10 years    Types: Pipe, Cigars    Quit date: 05/26/1991  . Smokeless tobacco: Current User    Types: Chew  . Alcohol Use: No  .  Drug Use: No  . Sexual Activity: Not on file   Other Topics Concern  . Not on file   Social History Narrative   The patient lives with his wife.     PHYSICAL EXAM   Filed Vitals:   04/11/15 0955  BP: 117/64  Pulse: 48  Height: 5\' 10"  (1.778 m)  Weight: 135 lb (61.236 kg)    Not recorded      Body mass index is 19.37 kg/(m^2).  PHYSICAL EXAMNIATION:  Gen: NAD, conversant, well nourised, obese, well groomed                     Cardiovascular: Regular rate rhythm, no peripheral edema, warm, nontender. Eyes: Conjunctivae clear without exudates or hemorrhage Neck: Supple, no carotid bruise. Pulmonary: Clear to auscultation bilaterally   NEUROLOGICAL EXAM:  MENTAL STATUS: Speech:    Speech is normal; fluent and spontaneous with normal comprehension.  Cognition:    The patient is oriented to person, place, and time;     recent and remote memory intact;     language fluent;     normal attention, concentration,     fund of knowledge.  CRANIAL NERVES: CN II: Visual fields are full to confrontation. Fundoscopic exam is normal with sharp discs and no vascular changes. Venous pulsations are present bilaterally. Pupils are 4 mm and briskly reactive to light. Visual acuity is 20/20 bilaterally. CN III, IV, VI: extraocular movement are normal. Mild left-sided fatigable ptosis, bilateral exophoria. CN V: Facial sensation is intact to pinprick in all 3 divisions bilaterally. Corneal responses are intact.  CN VII: Face is symmetric, he has moderate bilateral eye closure, cheek puff weakness. CN VIII: Hearing is normal to  rubbing fingers CN IX, X: Palate elevates symmetrically. Phonation is normal. CN XI: Head turning and shoulder shrug are intact CN XII: Tongue is midline with normal movements and no atrophy.  MOTOR: He has moderate neck flexion, bilateral shoulder abduction, hip flexion weakness, normal muscle tone, no atrophy   REFLEXES: Reflexes are 2+ and symmetric at the biceps, triceps, knees, and ankles. Plantar responses are flexor.  SENSORY: Light touch, pinprick, position sense, and vibration sense are intact in fingers and toes.  COORDINATION: Rapid alternating movements and fine finger movements are intact. There is no dysmetria on finger-to-nose and heel-knee-shin. There are no abnormal or extraneous movements.   GAIT/STANCE: Posture is normal. Gait is steady with normal steps, base, arm swing, and turning. Heel and toe walking are normal. Tandem gait is normal.  Romberg is absent.   DIAGNOSTIC DATA (LABS, IMAGING, TESTING) - I reviewed patient records, labs, notes, testing and imaging myself where available.  Lab Results  Component Value Date   WBC 8.1 03/05/2015   HGB 13.0 03/05/2015   HCT 38.7* 03/05/2015   MCV 99.0 03/05/2015   PLT 122.0* 03/05/2015      Component Value Date/Time   NA 139 03/05/2015 1008   NA 144 02/12/2015 0859   K 5.4* 03/05/2015 1008   K 5.3* 02/12/2015 0859   CL 104 03/05/2015 1008   CL 105 10/13/2012 0901   CO2 30 03/05/2015 1008   CO2 28 02/12/2015 0859   GLUCOSE 104* 03/05/2015 1008   GLUCOSE 124 02/12/2015 0859   GLUCOSE 99 10/13/2012 0901   BUN 41* 03/05/2015 1008   BUN 40.5* 02/12/2015 0859   CREATININE 2.82* 03/05/2015 1008   CREATININE 3.0* 02/12/2015 0859   CALCIUM 11.1* 03/05/2015 1008   CALCIUM 10.5* 02/12/2015 0174  PROT 7.4 03/05/2015 1008   PROT 6.4 12/18/2014 0840   ALBUMIN 4.5 03/05/2015 1008   ALBUMIN 3.7 12/18/2014 0840   AST 23 03/05/2015 1008   AST 23 12/18/2014 0840   ALT 16 03/05/2015 1008   ALT 16 12/18/2014 0840    ALKPHOS 62 03/05/2015 1008   ALKPHOS 63 12/18/2014 0840   BILITOT 0.8 03/05/2015 1008   BILITOT 0.92 12/18/2014 0840   GFRNONAA 20* 10/05/2014 0313   GFRAA 23* 10/05/2014 0313   Lab Results  Component Value Date   CHOL 120 03/05/2015   HDL 49.60 03/05/2015   LDLCALC 48 03/05/2015   TRIG 110.0 03/05/2015   CHOLHDL 2 03/05/2015   No results found for: HGBA1C Lab Results  Component Value Date   EZMOQHUT65 465 09/28/2014   Lab Results  Component Value Date   TSH 1.540 09/27/2014      ASSESSMENT AND PLAN  Nicholas [name] is a 81 y.o. male with complicated past medical history, including atrial fibrillation, status post pacemaker, on chronic Coumadin , chronic renal insufficiency, coronary artery disease, presenting with serum positive generalized myasthenia gravis, moderate bulbar and then muscle weakness, more than 70 pound weight loss since October 2015  1, his treatment of myasthenia gravis is complicated by his Multiple comorbidities, prednisone 20 mg every [name] for 2 weeks, tapering down to 10 mg every [name] 2. Mestinon 60 mg 1 tablets 3 times a [name] 3, long-term him on suppressive treatment, prednisone sparing agent, Imuran 50 mg 2 tablets twice a [name] 4, CT chest without contrast to rule out thymus pathology 5. Return to clinic in 3 weeks   Marcial Pacas, M.D. Ph.D.  [facility] [address] [place], [place] [address] Ph: [phone] Fax: [phone]

## 2015-04-14 ENCOUNTER — Telehealth: Payer: Self-pay

## 2015-04-14 NOTE — Telephone Encounter (Signed)
Called patient since he is on Prednisone 10 mg advised that the medication can increase INR.  Patient advised understanding and appointment set for Wednesday.

## 2015-04-14 NOTE — Telephone Encounter (Signed)
Pt called in new prescriptions: Azathioprine 50 mg 1 tablet BID Prednisone 10 mg 2 tablets in AM (morning) for 2 weeks and the 1 tablet in AM (morning) Pyridostigmine 60 mg one tablet TID  Return phone number [phone]

## 2015-04-15 ENCOUNTER — Telehealth: Payer: Self-pay | Admitting: Nurse Practitioner

## 2015-04-15 ENCOUNTER — Telehealth: Payer: Self-pay | Admitting: Neurology

## 2015-04-15 ENCOUNTER — Ambulatory Visit (HOSPITAL_BASED_OUTPATIENT_CLINIC_OR_DEPARTMENT_OTHER): Payer: Medicare Other | Admitting: Nurse Practitioner

## 2015-04-15 ENCOUNTER — Other Ambulatory Visit (HOSPITAL_BASED_OUTPATIENT_CLINIC_OR_DEPARTMENT_OTHER): Payer: Medicare Other

## 2015-04-15 ENCOUNTER — Ambulatory Visit: Payer: Medicare Other

## 2015-04-15 VITALS — BP 122/62 | HR 69 | Temp 97.6°F | Resp 18 | Ht 70.0 in | Wt 134.4 lb

## 2015-04-15 DIAGNOSIS — D631 Anemia in chronic kidney disease: Secondary | ICD-10-CM

## 2015-04-15 DIAGNOSIS — N189 Chronic kidney disease, unspecified: Principal | ICD-10-CM

## 2015-04-15 DIAGNOSIS — N184 Chronic kidney disease, stage 4 (severe): Secondary | ICD-10-CM | POA: Diagnosis not present

## 2015-04-15 DIAGNOSIS — Z85048 Personal history of other malignant neoplasm of rectum, rectosigmoid junction, and anus: Secondary | ICD-10-CM

## 2015-04-15 DIAGNOSIS — R634 Abnormal weight loss: Secondary | ICD-10-CM | POA: Diagnosis not present

## 2015-04-15 DIAGNOSIS — D696 Thrombocytopenia, unspecified: Secondary | ICD-10-CM

## 2015-04-15 DIAGNOSIS — D649 Anemia, unspecified: Secondary | ICD-10-CM

## 2015-04-15 DIAGNOSIS — N179 Acute kidney failure, unspecified: Secondary | ICD-10-CM

## 2015-04-15 DIAGNOSIS — N289 Disorder of kidney and ureter, unspecified: Secondary | ICD-10-CM

## 2015-04-15 LAB — CBC WITH DIFFERENTIAL/PLATELET
BASO%: 0.1 % (ref 0.0–2.0)
Basophils Absolute: 0 10*3/uL (ref 0.0–0.1)
EOS%: 0.2 % (ref 0.0–7.0)
Eosinophils Absolute: 0 10*3/uL (ref 0.0–0.5)
HEMATOCRIT: 38.8 % (ref 38.4–49.9)
HEMOGLOBIN: 12.6 g/dL — AB (ref 13.0–17.1)
LYMPH%: 13.9 % — ABNORMAL LOW (ref 14.0–49.0)
MCH: 32.9 pg (ref 27.2–33.4)
MCHC: 32.5 g/dL (ref 32.0–36.0)
MCV: 101.3 fL — AB (ref 79.3–98.0)
MONO#: 0.6 10*3/uL (ref 0.1–0.9)
MONO%: 7.9 % (ref 0.0–14.0)
NEUT%: 77.9 % — ABNORMAL HIGH (ref 39.0–75.0)
NEUTROS ABS: 6.3 10*3/uL (ref 1.5–6.5)
Platelets: 117 10*3/uL — ABNORMAL LOW (ref 140–400)
RBC: 3.83 10*6/uL — ABNORMAL LOW (ref 4.20–5.82)
RDW: 13.9 % (ref 11.0–14.6)
WBC: 8.1 10*3/uL (ref 4.0–10.3)
lymph#: 1.1 10*3/uL (ref 0.9–3.3)

## 2015-04-15 LAB — COMPREHENSIVE METABOLIC PANEL (CC13)
ALK PHOS: 55 U/L (ref 40–150)
ALT: 20 U/L (ref 0–55)
AST: 22 U/L (ref 5–34)
Albumin: 4.2 g/dL (ref 3.5–5.0)
Anion Gap: 13 mEq/L — ABNORMAL HIGH (ref 3–11)
BUN: 36.4 mg/dL — ABNORMAL HIGH (ref 7.0–26.0)
CO2: 26 mEq/L (ref 22–29)
CREATININE: 2.6 mg/dL — AB (ref 0.7–1.3)
Calcium: 11 mg/dL — ABNORMAL HIGH (ref 8.4–10.4)
Chloride: 107 mEq/L (ref 98–109)
EGFR: 23 mL/min/{1.73_m2} — ABNORMAL LOW (ref >90)
Glucose: 101 mg/dl (ref 70–140)
Potassium: 4.5 mEq/L (ref 3.5–5.1)
Sodium: 146 mEq/L — ABNORMAL HIGH (ref 136–145)
Total Bilirubin: 1.35 mg/dL — ABNORMAL HIGH (ref 0.20–1.20)
Total Protein: 6.7 g/dL (ref 6.4–8.3)

## 2015-04-15 MED ORDER — EPOETIN ALFA 20000 UNIT/ML IJ SOLN: 20000.0000 [IU] | Freq: Once | INTRAMUSCULAR | Status: DC

## 2015-04-15 NOTE — Telephone Encounter (Signed)
Gave avs & calednar for June.

## 2015-04-15 NOTE — Progress Notes (Signed)
  [facility] OFFICE PROGRESS NOTE   Diagnosis:  Anemia  INTERVAL HISTORY:   Nicholas [name] returns as scheduled. He overall feels well. He reports his appetite is better. No dysphagia. He has lost some weight since his last visit. He attributes this to improvement in leg swelling. He had some constipation last week. Otherwise colostomy has been functioning normally. He intermittently notes stools are black due to oral iron.  Objective:  Vital signs in last 24 hours:  Blood pressure 122/62, pulse 69, temperature 97.6 F (36.4 C), temperature source Oral, resp. rate 18, height 5\' 10"  (1.778 m), weight 134 lb 6.4 oz (60.963 kg), SpO2 100 %.    HEENT: No thrush or ulcers. Lymphatics: No palpable cervical, supraclavicular, axillary or inguinal lymph nodes. Resp: Lungs clear bilaterally. Cardio: Irregular. GI: Abdomen soft and nontender. No hepatomegaly. Left lower quadrant colostomy. Vascular: No leg edema.   Lab Results:  Lab Results  Component Value Date   WBC 8.1 04/15/2015   HGB 12.6* 04/15/2015   HCT 38.8 04/15/2015   MCV 101.3* 04/15/2015   PLT 117* 04/15/2015   NEUTROABS 6.3 04/15/2015    Imaging:  No results found.  Medications: I have reviewed the patient's current medications.  Assessment/Plan: 1. Rectal cancer, stage II, T3 N0, status post neoadjuvant infusional 5-fluorouracil and radiation, status post APR 06/01/2007. He completed 4 months of adjuvant Xeloda.  Colonoscopy June 2012-negative, repeat planned for 5 years 2. Anemia. Likely related to renal failure versus myelodysplasia. Improved with erythropoietin. Last treated with erythropoietin in September 2015 3. Admission with pyelonephritis 11/20/2013. 4. Chronic renal failure. 5. Mild thrombocytopenia-? Myelodysplasia,? ITP-stable 6. Anorexia/weight loss. Question etiology. 7. Dysphagia. Status post an upper endoscopy 11/27/2014 with dilation of a gastroesophageal junction  stricture   Disposition: Nicholas [name] appears stable from a hematologic standpoint. He will not receive erythropoietin today. The plan is to resume erythropoietin if the hemoglobin falls. He will return for a follow-up visit and CBC in 2 months. He will contact the office in the interim with any problems.    Ned Card ANP/GNP-BC   04/15/2015  9:00 AM

## 2015-04-15 NOTE — Telephone Encounter (Addendum)
Michelle: Please call patient, laboratory showed positive acetylcholine receptor antibody, consistent with diagnosis of generalized myasthenia gravis, continue with treatment plan, there was also elevated ANA, double-stranded DNA, we will repeat testing at his next follow-up visit in May 16 2015   CT chest was normal

## 2015-04-16 ENCOUNTER — Ambulatory Visit
Admission: RE | Admit: 2015-04-16 | Discharge: 2015-04-16 | Disposition: A | Discharge status: Home / Self Care | Payer: Medicare Other | Source: Ambulatory Visit | Attending: Neurology | Admitting: Neurology

## 2015-04-16 ENCOUNTER — Ambulatory Visit (INDEPENDENT_AMBULATORY_CARE_PROVIDER_SITE_OTHER): Payer: Medicare Other | Admitting: *Deleted

## 2015-04-16 DIAGNOSIS — I4891 Unspecified atrial fibrillation: Secondary | ICD-10-CM | POA: Diagnosis not present

## 2015-04-16 DIAGNOSIS — Z5181 Encounter for therapeutic drug level monitoring: Secondary | ICD-10-CM | POA: Diagnosis not present

## 2015-04-16 DIAGNOSIS — I2510 Atherosclerotic heart disease of native coronary artery without angina pectoris: Secondary | ICD-10-CM

## 2015-04-16 DIAGNOSIS — G7000 Myasthenia gravis without (acute) exacerbation: Secondary | ICD-10-CM

## 2015-04-16 DIAGNOSIS — N184 Chronic kidney disease, stage 4 (severe): Secondary | ICD-10-CM

## 2015-04-16 DIAGNOSIS — Z951 Presence of aortocoronary bypass graft: Secondary | ICD-10-CM

## 2015-04-16 DIAGNOSIS — I2583 Coronary atherosclerosis due to lipid rich plaque: Secondary | ICD-10-CM

## 2015-04-16 LAB — POCT INR: INR: 2.4

## 2015-04-17 NOTE — Telephone Encounter (Signed)
Spoke to patient - aware of results and to continue treatment plan.  He will keep his follow up appt.

## 2015-04-21 ENCOUNTER — Ambulatory Visit (INDEPENDENT_AMBULATORY_CARE_PROVIDER_SITE_OTHER): Payer: Medicare Other | Admitting: *Deleted

## 2015-04-21 DIAGNOSIS — Z5181 Encounter for therapeutic drug level monitoring: Secondary | ICD-10-CM

## 2015-04-21 DIAGNOSIS — I4891 Unspecified atrial fibrillation: Secondary | ICD-10-CM | POA: Diagnosis not present

## 2015-04-21 LAB — POCT INR: INR: 1.5

## 2015-04-23 LAB — ANA W/REFLEX IF POSITIVE
Anti JO-1: <0.2 AI (ref 0.0–0.9)
Anti Nuclear Antibody(ANA): POSITIVE — AB
Centromere Ab Screen: <0.2 AI (ref 0.0–0.9)
Chromatin Ab SerPl-aCnc: 0.2 AI (ref 0.0–0.9)
ENA SSA (RO) Ab: <0.2 AI (ref 0.0–0.9)
dsDNA Ab: 28 IU/mL — ABNORMAL HIGH (ref 0–9)

## 2015-04-23 LAB — ACETYLCHOLINE RECEPTOR, MODULATING: Acetylcholine Modulat Ab: 37 % — ABNORMAL HIGH (ref 0–20)

## 2015-04-23 LAB — C-REACTIVE PROTEIN: CRP: 3.4 mg/L (ref 0.0–4.9)

## 2015-04-23 LAB — THYROID PANEL WITH TSH
Free Thyroxine Index: 2.6 (ref 1.2–4.9)
T3 UPTAKE RATIO: 36 % (ref 24–39)
T4, Total: 7.1 ug/dL (ref 4.5–12.0)
TSH: 1.95 u[IU]/mL (ref 0.450–4.500)

## 2015-04-23 LAB — SEDIMENTATION RATE: SED RATE: 2 mm/h (ref 0–30)

## 2015-04-23 LAB — ACETYLCHOLINE RECEPTOR, BINDING: AChR Binding Ab, Serum: 4.35 nmol/L — ABNORMAL HIGH (ref 0.00–0.24)

## 2015-05-01 ENCOUNTER — Ambulatory Visit (INDEPENDENT_AMBULATORY_CARE_PROVIDER_SITE_OTHER): Payer: Medicare Other

## 2015-05-01 DIAGNOSIS — Z5181 Encounter for therapeutic drug level monitoring: Secondary | ICD-10-CM | POA: Diagnosis not present

## 2015-05-01 DIAGNOSIS — I4891 Unspecified atrial fibrillation: Secondary | ICD-10-CM | POA: Diagnosis not present

## 2015-05-01 LAB — POCT INR: INR: 1.2

## 2015-05-08 ENCOUNTER — Ambulatory Visit (INDEPENDENT_AMBULATORY_CARE_PROVIDER_SITE_OTHER): Payer: Medicare Other

## 2015-05-08 DIAGNOSIS — I4891 Unspecified atrial fibrillation: Secondary | ICD-10-CM | POA: Diagnosis not present

## 2015-05-08 DIAGNOSIS — Z5181 Encounter for therapeutic drug level monitoring: Secondary | ICD-10-CM

## 2015-05-08 LAB — POCT INR: INR: 1.3

## 2015-05-10 ENCOUNTER — Encounter: Payer: Self-pay | Admitting: Nurse Practitioner

## 2015-05-10 NOTE — Progress Notes (Signed)
Electrophysiology Office Note Date: 05/12/2015  ID:  Nicholas [name], DOB 03-04-1934, MRN [id number]  PCP: Leonard Downing, MD Primary Cardiologist: Mare Ferrari Electrophysiologist: Allred  CC: Follow up on biventricular pacing  Nicholas [name] is a 81 y.o. male is seen today for Dr Rayann Heman.  He presents today for follow up on biventricular pacing percentage.  He underwent CRTP implantation in October of 2015 for symptomatic bradycardia/ICM/CHF.  At his 3 month visit, his CRT pacing percentage was decreased due to AF with RVR and PVC's.  His BB was increased and he presents today for re-evaluation of CRT pacing.    Since last being seen in our clinic, the patient reports doing very well.  He denies chest pain, palpitations, dyspnea, PND, orthopnea, nausea, vomiting, dizziness, syncope, edema, weight gain, or early satiety.  Last echo 09-2014 demonstrated EF 40-45%, severe hypokinesis of basal-midinferolateral, inferior, and inferoseptal myocardium; mild to moderate MR; LA 50  Device History: STJ CRTP implanted 2015 for symptomatic bradycardia/ICM/CHF   Past Medical History  Diagnosis Date  . 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 Pure hypercholesterolemia     takes Vytorin daily  . Diverticulosis of colon (without mention of hemorrhage)   . Hypertension     takes Amlodipine,Metoprolol,and Cardura daily  . Coronary artery disease     a. s/p CABG  . CHF (congestive heart failure)     a. s/p STJ CRTD  . H/O blood clots 1993  . Arthritis   . Skin cancer     nose  . GERD (gastroesophageal reflux disease)        . Enlarged prostate        . Insomnia   . CKD (chronic kidney disease) stage 4, GFR 15-29 ml/min   . Permanent atrial fibrillation   . PVC (premature ventricular contraction)   . Ischemic cardiomyopathy    Past Surgical History  Procedure Laterality Date  . Shoulder surgery  2007    left  . Cataract extraction      right  . Abdominoperineal proctocolectomy   06/01/2007  . Coronary angioplasty  05/22/92  . Colostomy  2008  . Colonoscopy    . Coronary artery bypass graft  1993  . Colon surgery  2008    colectomy  . Total knee arthroplasty  02/28/2012    Procedure: TOTAL KNEE ARTHROPLASTY;  Surgeon: Rudean Haskell, MD;  Location: [facility];  Service: Orthopedics;  Laterality: Right;  . Bi-ventricular pacemaker insertion N/A 10/01/2014    SJM Allure Quadra implnated by Dr Rayann Heman for symptomatic bradycadria    Current Outpatient Prescriptions  Medication Sig Dispense Refill  . azaTHIOprine (IMURAN) 50 MG tablet One tab po bid xone week, then 2 tabs po bid 120 tablet 11  . feeding supplement, ENSURE COMPLETE, (ENSURE COMPLETE) LIQD Take 237 mLs by mouth daily.    . ferrous sulfate 325 (65 FE) MG tablet Take 325 mg by mouth 2 (two) times daily with a meal.     . finasteride (PROSCAR) 5 MG tablet Take 5 mg by mouth daily.      . furosemide (LASIX) 40 MG tablet Take 1 tablet (40 mg total) by mouth daily.    . hydrALAZINE (APRESOLINE) 25 MG tablet Take 1 tablet (25 mg total) by mouth 2 (two) times daily. 60 tablet 5  . isosorbide dinitrate (ISORDIL) 20 MG tablet Take 10 mg by mouth 2 (two) times daily.    . metoprolol succinate (TOPROL-XL) 50 MG 24 hr  tablet Take 1 1/2 tablets by mouth daily with or immediately following a meal. 135 tablet 3  . nitroGLYCERIN (NITROSTAT) 0.4 MG SL tablet Place 1 tablet (0.4 mg total) under the tongue every 5 (five) minutes as needed. For chest pain 25 tablet 6  . predniSONE (DELTASONE) 10 MG tablet 2 tabs po qam x 2 weeks, then one tab po qam 60 tablet 6  . pyridostigmine (MESTINON) 60 MG tablet 1/2 to one tab po tid 90 tablet 11  . simvastatin (ZOCOR) 20 MG tablet Take 1 tablet (20 mg total) by mouth at bedtime. 90 tablet 3  . tamsulosin (FLOMAX) 0.4 MG CAPS capsule Take 1 capsule (0.4 mg total) by mouth daily after supper. 30 capsule 1  . vitamin C (ASCORBIC ACID) 500 MG tablet Take 500 mg by mouth daily.      .  Vitamins-Lipotropics (B-100 COMPLEX) TABS Take 1 tablet by mouth daily.     Marland Kitchen warfarin (COUMADIN) 3 MG tablet TAKE AS DIRECTED PER COUMADIN CLINIC 40 tablet 3   No current facility-administered medications for this visit.    Allergies:   Cephalexin; Ciprofloxacin; Hctz; Levofloxacin; Lipitor; Niaspan; Oxycodone-acetaminophen; and Sulfonamide derivatives   Social History: History   Social History  . Marital Status: Married    Spouse Name: N/A  . Number of Children: N/A  . Years of Education: N/A   Occupational History  . retired     Warehouse manager   Social History Main Topics  . Smoking status: Former Smoker -- 10 years    Types: Pipe, Cigars    Quit date: 05/26/1991  . Smokeless tobacco: Current User    Types: Chew  . Alcohol Use: No  . Drug Use: No  . Sexual Activity: Not on file   Other Topics Concern  . Not on file   Social History Narrative   The patient lives with his wife.    Family History: Family History  Problem Relation Age of Onset  . Pancreatic cancer Father   . Diabetes Son   . Emphysema Mother   . Anesthesia problems Neg Hx   . Hypotension Neg Hx   . Malignant hyperthermia Neg Hx   . Pseudochol deficiency Neg Hx      Review of Systems: All other systems reviewed and are otherwise negative except as noted above.   Physical Exam: VS:  BP 133/64 mmHg  Pulse 69  Ht 5\' 10"  (1.778 m)  Wt 145 lb 3.2 oz (65.862 kg)  BMI 20.83 kg/m2 , BMI Body mass index is 20.83 kg/(m^2).  GEN- The patient is elderly appearing, alert and oriented x 3 today.   HEENT: normocephalic, atraumatic; sclera clear, conjunctiva pink; hearing intact; oropharynx clear; neck supple, no JVP Lymph- no cervical lymphadenopathy Lungs- Clear to ausculation bilaterally, normal work of breathing.  No wheezes, rales, rhonchi Heart- Irregular rate and rhythm, no murmurs, rubs or gallops  GI- + colostomy, soft, non-tender, non-distended, bowel sounds present  Extremities- no clubbing,  cyanosis, or edema; DP/PT/radial pulses 2+ bilaterally MS- no significant deformity or atrophy Skin- warm and dry, no rash or lesion; PPM pocket well healed Psych- euthymic mood, full affect Neuro- strength and sensation are intact  PPM Interrogation- reviewed in detail today,  See PACEART report  EKG:  EKG is not ordered today.  Recent Labs: 09/27/2014: Magnesium 2.1; Pro B Natriuretic peptide (BNP) 7803.0* 04/11/2015: TSH 1.950 04/15/2015: ALT 20; BUN 36.4*; Creatinine 2.6*; Hemoglobin 12.6*; Platelets 117*; Potassium 4.5; Sodium 146*   Wt Readings from  Last 3 Encounters:  05/12/15 145 lb 3.2 oz (65.862 kg)  04/15/15 134 lb 6.4 oz (60.963 kg)  04/11/15 135 lb (61.236 kg)     Assessment and Plan:  1.  Chronic systolic dysfunction/symptomatic bradycardia Normal CRTP function See Pace Art report CRT pacing 67% at device interrogation today which is slightly improved from last OV with Dr Rayann Heman.   Euvolemic on exam Recheck echo once medical therapy optimized and CRT pacing % improved.   2.  Permanent atrial fibrillation Continue Warfarin for CHADS2VASC score of 5  3.  PVC's Frequent PVC's resulting in decreased % of CRT pacing.   Increase Metoprolol to 50mg  twice daily  4.  HTN Stable No change required today  5.  CAD No recent ischemic symptoms Continue medical thearpy   Current medicines are reviewed at length with the patient today.   The patient does not have concerns regarding his medicines.  The following changes were made today:  Increase Metoprolol to 50mg  twice daily today  Labs/ tests ordered today include: none    Disposition:   Follow up with Dr Mare Ferrari as scheduled, follow up with Dr Rayann Heman in 3 months. Will schedule Merlin transmission for 1 month to evaluate CRT pacing     Signed, Chanetta Marshall, NP 05/12/2015 10:35 AM  [facility] [address] [place] [address] [phone] (office) [phone] (fax)

## 2015-05-11 ENCOUNTER — Encounter: Payer: Self-pay | Admitting: Nurse Practitioner

## 2015-05-12 ENCOUNTER — Other Ambulatory Visit: Payer: Self-pay | Admitting: Internal Medicine

## 2015-05-12 ENCOUNTER — Encounter: Payer: Self-pay | Admitting: Nurse Practitioner

## 2015-05-12 ENCOUNTER — Ambulatory Visit (INDEPENDENT_AMBULATORY_CARE_PROVIDER_SITE_OTHER): Payer: Medicare Other | Admitting: Nurse Practitioner

## 2015-05-12 VITALS — BP 133/64 | HR 69 | Ht 70.0 in | Wt 145.2 lb

## 2015-05-12 DIAGNOSIS — I10 Essential (primary) hypertension: Secondary | ICD-10-CM

## 2015-05-12 DIAGNOSIS — I482 Chronic atrial fibrillation, unspecified: Secondary | ICD-10-CM

## 2015-05-12 DIAGNOSIS — R001 Bradycardia, unspecified: Secondary | ICD-10-CM

## 2015-05-12 DIAGNOSIS — I5022 Chronic systolic (congestive) heart failure: Secondary | ICD-10-CM

## 2015-05-12 DIAGNOSIS — I4821 Permanent atrial fibrillation: Secondary | ICD-10-CM

## 2015-05-12 DIAGNOSIS — I4891 Unspecified atrial fibrillation: Secondary | ICD-10-CM | POA: Diagnosis not present

## 2015-05-12 LAB — CUP PACEART INCLINIC DEVICE CHECK
Date Time Interrogation Session: 20160516161621
Lead Channel Setting Pacing Amplitude: 2.25 V
Lead Channel Setting Pacing Pulse Width: 0.5 ms
MDC IDC SET LEADCHNL RV PACING AMPLITUDE: 2.5 V
MDC IDC SET LEADCHNL RV PACING PULSEWIDTH: 0.5 ms
MDC IDC SET LEADCHNL RV SENSING SENSITIVITY: 2 mV
Pulse Gen Model: 3242
Pulse Gen Serial Number: 7667871

## 2015-05-12 MED ORDER — METOPROLOL SUCCINATE ER 50 MG PO TB24: 50.0000 mg | ORAL_TABLET | Freq: Two times a day (BID) | ORAL | Status: AC

## 2015-05-12 NOTE — Patient Instructions (Addendum)
Medication Instructions:  Your physician has recommended you make the following change in your medication: INCREASE Metoprolol to 50mg  Twice Daily  Labwork: None   Testing/Procedures: None   Follow-Up: Follow up as planned with [place] [facility] wants you to follow-up in: 6 months with Dr.Allred You will receive a reminder letter in the mail two months in advance. If you don't receive a letter, please call our office to schedule the follow-up appointment.   Any Other Special Instructions Will Be Listed Below (If Applicable).

## 2015-05-13 ENCOUNTER — Encounter (HOSPITAL_COMMUNITY): Payer: Self-pay | Admitting: Emergency Medicine

## 2015-05-13 ENCOUNTER — Inpatient Hospital Stay (HOSPITAL_COMMUNITY)
Admission: EM | Admit: 2015-05-13 | Discharge: 2015-05-28 | DRG: 808 | Disposition: E | Payer: Medicare Other | Attending: Pulmonary Disease | Admitting: Pulmonary Disease

## 2015-05-13 ENCOUNTER — Encounter: Payer: Self-pay | Admitting: Neurology

## 2015-05-13 ENCOUNTER — Emergency Department (HOSPITAL_COMMUNITY): Payer: Medicare Other

## 2015-05-13 ENCOUNTER — Ambulatory Visit (INDEPENDENT_AMBULATORY_CARE_PROVIDER_SITE_OTHER): Payer: Medicare Other | Admitting: Neurology

## 2015-05-13 VITALS — BP 124/61 | HR 45 | Ht 70.0 in | Wt 144.0 lb

## 2015-05-13 DIAGNOSIS — Y95 Nosocomial condition: Secondary | ICD-10-CM | POA: Diagnosis present

## 2015-05-13 DIAGNOSIS — I214 Non-ST elevation (NSTEMI) myocardial infarction: Secondary | ICD-10-CM | POA: Diagnosis present

## 2015-05-13 DIAGNOSIS — I129 Hypertensive chronic kidney disease with stage 1 through stage 4 chronic kidney disease, or unspecified chronic kidney disease: Secondary | ICD-10-CM | POA: Diagnosis present

## 2015-05-13 DIAGNOSIS — D630 Anemia in neoplastic disease: Secondary | ICD-10-CM | POA: Diagnosis present

## 2015-05-13 DIAGNOSIS — B961 Klebsiella pneumoniae [K. pneumoniae] as the cause of diseases classified elsewhere: Secondary | ICD-10-CM | POA: Diagnosis present

## 2015-05-13 DIAGNOSIS — Z96651 Presence of right artificial knee joint: Secondary | ICD-10-CM | POA: Diagnosis present

## 2015-05-13 DIAGNOSIS — I10 Essential (primary) hypertension: Secondary | ICD-10-CM | POA: Diagnosis present

## 2015-05-13 DIAGNOSIS — Z9289 Personal history of other medical treatment: Secondary | ICD-10-CM

## 2015-05-13 DIAGNOSIS — N184 Chronic kidney disease, stage 4 (severe): Secondary | ICD-10-CM | POA: Diagnosis not present

## 2015-05-13 DIAGNOSIS — E2740 Unspecified adrenocortical insufficiency: Secondary | ICD-10-CM | POA: Diagnosis present

## 2015-05-13 DIAGNOSIS — R0600 Dyspnea, unspecified: Secondary | ICD-10-CM

## 2015-05-13 DIAGNOSIS — E43 Unspecified severe protein-calorie malnutrition: Secondary | ICD-10-CM | POA: Diagnosis present

## 2015-05-13 DIAGNOSIS — Z9841 Cataract extraction status, right eye: Secondary | ICD-10-CM

## 2015-05-13 DIAGNOSIS — G7000 Myasthenia gravis without (acute) exacerbation: Secondary | ICD-10-CM | POA: Diagnosis present

## 2015-05-13 DIAGNOSIS — T451X5A Adverse effect of antineoplastic and immunosuppressive drugs, initial encounter: Secondary | ICD-10-CM | POA: Diagnosis present

## 2015-05-13 DIAGNOSIS — E162 Hypoglycemia, unspecified: Secondary | ICD-10-CM | POA: Diagnosis present

## 2015-05-13 DIAGNOSIS — I5023 Acute on chronic systolic (congestive) heart failure: Secondary | ICD-10-CM | POA: Diagnosis present

## 2015-05-13 DIAGNOSIS — I481 Persistent atrial fibrillation: Secondary | ICD-10-CM | POA: Diagnosis present

## 2015-05-13 DIAGNOSIS — I482 Chronic atrial fibrillation: Secondary | ICD-10-CM | POA: Diagnosis present

## 2015-05-13 DIAGNOSIS — Z885 Allergy status to narcotic agent status: Secondary | ICD-10-CM

## 2015-05-13 DIAGNOSIS — K5730 Diverticulosis of large intestine without perforation or abscess without bleeding: Secondary | ICD-10-CM | POA: Diagnosis present

## 2015-05-13 DIAGNOSIS — E7800 Pure hypercholesterolemia, unspecified: Secondary | ICD-10-CM | POA: Diagnosis present

## 2015-05-13 DIAGNOSIS — M1990 Unspecified osteoarthritis, unspecified site: Secondary | ICD-10-CM | POA: Diagnosis present

## 2015-05-13 DIAGNOSIS — E785 Hyperlipidemia, unspecified: Secondary | ICD-10-CM | POA: Diagnosis present

## 2015-05-13 DIAGNOSIS — N189 Chronic kidney disease, unspecified: Secondary | ICD-10-CM

## 2015-05-13 DIAGNOSIS — R570 Cardiogenic shock: Secondary | ICD-10-CM | POA: Diagnosis not present

## 2015-05-13 DIAGNOSIS — D61811 Other drug-induced pancytopenia: Secondary | ICD-10-CM | POA: Diagnosis not present

## 2015-05-13 DIAGNOSIS — Z7952 Long term (current) use of systemic steroids: Secondary | ICD-10-CM

## 2015-05-13 DIAGNOSIS — D631 Anemia in chronic kidney disease: Secondary | ICD-10-CM | POA: Diagnosis present

## 2015-05-13 DIAGNOSIS — E872 Acidosis: Secondary | ICD-10-CM | POA: Diagnosis not present

## 2015-05-13 DIAGNOSIS — J9811 Atelectasis: Secondary | ICD-10-CM | POA: Diagnosis not present

## 2015-05-13 DIAGNOSIS — R1310 Dysphagia, unspecified: Secondary | ICD-10-CM | POA: Diagnosis present

## 2015-05-13 DIAGNOSIS — I2510 Atherosclerotic heart disease of native coronary artery without angina pectoris: Secondary | ICD-10-CM | POA: Diagnosis present

## 2015-05-13 DIAGNOSIS — D709 Neutropenia, unspecified: Secondary | ICD-10-CM

## 2015-05-13 DIAGNOSIS — Z882 Allergy status to sulfonamides status: Secondary | ICD-10-CM

## 2015-05-13 DIAGNOSIS — F17220 Nicotine dependence, chewing tobacco, uncomplicated: Secondary | ICD-10-CM | POA: Diagnosis present

## 2015-05-13 DIAGNOSIS — R7989 Other specified abnormal findings of blood chemistry: Secondary | ICD-10-CM

## 2015-05-13 DIAGNOSIS — A419 Sepsis, unspecified organism: Secondary | ICD-10-CM

## 2015-05-13 DIAGNOSIS — Z7901 Long term (current) use of anticoagulants: Secondary | ICD-10-CM

## 2015-05-13 DIAGNOSIS — J9600 Acute respiratory failure, unspecified whether with hypoxia or hypercapnia: Secondary | ICD-10-CM

## 2015-05-13 DIAGNOSIS — Z66 Do not resuscitate: Secondary | ICD-10-CM | POA: Diagnosis not present

## 2015-05-13 DIAGNOSIS — G4700 Insomnia, unspecified: Secondary | ICD-10-CM | POA: Diagnosis present

## 2015-05-13 DIAGNOSIS — D61818 Other pancytopenia: Secondary | ICD-10-CM | POA: Diagnosis present

## 2015-05-13 DIAGNOSIS — J9690 Respiratory failure, unspecified, unspecified whether with hypoxia or hypercapnia: Secondary | ICD-10-CM | POA: Diagnosis present

## 2015-05-13 DIAGNOSIS — R0602 Shortness of breath: Secondary | ICD-10-CM | POA: Diagnosis present

## 2015-05-13 DIAGNOSIS — G9340 Encephalopathy, unspecified: Secondary | ICD-10-CM

## 2015-05-13 DIAGNOSIS — I5022 Chronic systolic (congestive) heart failure: Secondary | ICD-10-CM | POA: Diagnosis present

## 2015-05-13 DIAGNOSIS — J9621 Acute and chronic respiratory failure with hypoxia: Secondary | ICD-10-CM | POA: Diagnosis present

## 2015-05-13 DIAGNOSIS — K219 Gastro-esophageal reflux disease without esophagitis: Secondary | ICD-10-CM | POA: Diagnosis present

## 2015-05-13 DIAGNOSIS — J9601 Acute respiratory failure with hypoxia: Secondary | ICD-10-CM | POA: Diagnosis not present

## 2015-05-13 DIAGNOSIS — N179 Acute kidney failure, unspecified: Secondary | ICD-10-CM | POA: Diagnosis present

## 2015-05-13 DIAGNOSIS — I4891 Unspecified atrial fibrillation: Secondary | ICD-10-CM | POA: Diagnosis present

## 2015-05-13 DIAGNOSIS — J189 Pneumonia, unspecified organism: Secondary | ICD-10-CM | POA: Diagnosis present

## 2015-05-13 DIAGNOSIS — R34 Anuria and oliguria: Secondary | ICD-10-CM | POA: Diagnosis not present

## 2015-05-13 DIAGNOSIS — Z933 Colostomy status: Secondary | ICD-10-CM

## 2015-05-13 DIAGNOSIS — R4182 Altered mental status, unspecified: Secondary | ICD-10-CM | POA: Insufficient documentation

## 2015-05-13 DIAGNOSIS — N401 Enlarged prostate with lower urinary tract symptoms: Secondary | ICD-10-CM | POA: Diagnosis present

## 2015-05-13 DIAGNOSIS — D649 Anemia, unspecified: Secondary | ICD-10-CM | POA: Diagnosis present

## 2015-05-13 DIAGNOSIS — Z950 Presence of cardiac pacemaker: Secondary | ICD-10-CM

## 2015-05-13 DIAGNOSIS — Z85828 Personal history of other malignant neoplasm of skin: Secondary | ICD-10-CM

## 2015-05-13 DIAGNOSIS — Z452 Encounter for adjustment and management of vascular access device: Secondary | ICD-10-CM

## 2015-05-13 DIAGNOSIS — Z951 Presence of aortocoronary bypass graft: Secondary | ICD-10-CM

## 2015-05-13 DIAGNOSIS — I959 Hypotension, unspecified: Secondary | ICD-10-CM | POA: Diagnosis not present

## 2015-05-13 DIAGNOSIS — G9341 Metabolic encephalopathy: Secondary | ICD-10-CM | POA: Diagnosis not present

## 2015-05-13 DIAGNOSIS — K7200 Acute and subacute hepatic failure without coma: Secondary | ICD-10-CM | POA: Diagnosis not present

## 2015-05-13 DIAGNOSIS — N400 Enlarged prostate without lower urinary tract symptoms: Secondary | ICD-10-CM | POA: Diagnosis present

## 2015-05-13 DIAGNOSIS — Z85048 Personal history of other malignant neoplasm of rectum, rectosigmoid junction, and anus: Secondary | ICD-10-CM

## 2015-05-13 DIAGNOSIS — Z881 Allergy status to other antibiotic agents status: Secondary | ICD-10-CM

## 2015-05-13 DIAGNOSIS — I429 Cardiomyopathy, unspecified: Secondary | ICD-10-CM | POA: Insufficient documentation

## 2015-05-13 DIAGNOSIS — I255 Ischemic cardiomyopathy: Secondary | ICD-10-CM | POA: Diagnosis present

## 2015-05-13 DIAGNOSIS — R918 Other nonspecific abnormal finding of lung field: Secondary | ICD-10-CM

## 2015-05-13 DIAGNOSIS — R64 Cachexia: Secondary | ICD-10-CM | POA: Diagnosis present

## 2015-05-13 DIAGNOSIS — R945 Abnormal results of liver function studies: Secondary | ICD-10-CM | POA: Insufficient documentation

## 2015-05-13 HISTORY — DX: Hyperlipidemia, unspecified: E78.5

## 2015-05-13 HISTORY — DX: Encounter for other specified aftercare: Z51.89

## 2015-05-13 HISTORY — DX: Myasthenia gravis without (acute) exacerbation: G70.00

## 2015-05-13 HISTORY — DX: Presence of cardiac pacemaker: Z95.0

## 2015-05-13 LAB — BASIC METABOLIC PANEL
Anion gap: 13 (ref 5–15)
BUN: 40 mg/dL — AB (ref 6–20)
CHLORIDE: 100 mmol/L — AB (ref 101–111)
CO2: 29 mmol/L (ref 22–32)
Calcium: 9.9 mg/dL (ref 8.9–10.3)
Creatinine, Ser: 2.82 mg/dL — ABNORMAL HIGH (ref 0.61–1.24)
GFR calc Af Amer: 23 mL/min — ABNORMAL LOW (ref >60)
GFR calc non Af Amer: 20 mL/min — ABNORMAL LOW (ref >60)
Glucose, Bld: 97 mg/dL (ref 65–99)
POTASSIUM: 3.6 mmol/L (ref 3.5–5.1)
Sodium: 142 mmol/L (ref 135–145)

## 2015-05-13 LAB — BRAIN NATRIURETIC PEPTIDE: B NATRIURETIC PEPTIDE 5: 1324.5 pg/mL — AB (ref 0.0–100.0)

## 2015-05-13 LAB — I-STAT TROPONIN, ED: TROPONIN I, POC: 0.06 ng/mL (ref 0.00–0.08)

## 2015-05-13 MED ORDER — FUROSEMIDE 10 MG/ML IJ SOLN
40.0000 mg | Freq: Once | INTRAMUSCULAR | Status: AC
  Administered 2015-05-14: 40 mg via INTRAVENOUS
  Filled 2015-05-13: qty 4

## 2015-05-13 NOTE — ED Provider Notes (Signed)
CSN: [id number]     Arrival date & time 05/23/2015  2124 History   First MD Initiated Contact with Patient 05/12/2015 2134     Chief Complaint  Patient presents with  . Shortness of Breath     (Consider location/radiation/quality/duration/timing/severity/associated sxs/prior Treatment) HPI  Pt with hx of afib, CHF, CAD s/p Cabg presenting with increased shortness of breath.  He has been feeling more short of breath over the past week, worse tonight.  No chest pain.  Has had increased bilateral lower extremity edema today as well.  Pt has biventricular pacer for CHF that was placed 10/15.  Pt also states weight has increased by 5 pounds this week.  No fever/chills.  Mild cough.  There are no other associated systemic symptoms, there are no other alleviating or modifying factors.   Past Medical History  Diagnosis Date  . 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 Pure hypercholesterolemia     takes Vytorin daily  . Diverticulosis of colon (without mention of hemorrhage)   . Hypertension     takes Amlodipine,Metoprolol,and Cardura daily  . Coronary artery disease     a. s/p CABG  . CHF (congestive heart failure)     a. s/p STJ CRTD  . H/O blood clots 1993  . Arthritis   . Skin cancer     nose  . GERD (gastroesophageal reflux disease)        . Enlarged prostate        . Insomnia   . CKD (chronic kidney disease) stage 4, GFR 15-29 ml/min   . Permanent atrial fibrillation   . PVC (premature ventricular contraction)   . Ischemic cardiomyopathy   . HLD (hyperlipidemia)   . Blood transfusion without reported diagnosis   . Myasthenia gravis   . Pacemaker 09/2014   Past Surgical History  Procedure Laterality Date  . Shoulder surgery  2007    left  . Cataract extraction      right  . Abdominoperineal proctocolectomy  06/01/2007  . Coronary angioplasty  05/22/92  . Colostomy  2008  . Colonoscopy    . Coronary artery bypass graft  1993  . Colon surgery  2008    colectomy  . Total knee  arthroplasty  02/28/2012    Procedure: TOTAL KNEE ARTHROPLASTY;  Surgeon: Rudean Haskell, MD;  Location: [facility];  Service: Orthopedics;  Laterality: Right;  . Bi-ventricular pacemaker insertion N/A 10/01/2014    SJM Allure Quadra implnated by Dr Rayann Heman for symptomatic bradycadria   Family History  Problem Relation Age of Onset  . Pancreatic cancer Father   . Diabetes Son   . Emphysema Mother   . Anesthesia problems Neg Hx   . Hypotension Neg Hx   . Malignant hyperthermia Neg Hx   . Pseudochol deficiency Neg Hx    History  Substance Use Topics  . Smoking status: Former Smoker -- 10 years    Types: Pipe, Cigars    Quit date: 05/26/1991  . Smokeless tobacco: Current User    Types: Chew  . Alcohol Use: No    Review of Systems  ROS reviewed and all otherwise negative except for mentioned in HPI    Allergies  Cephalexin; Ciprofloxacin; Hctz; Levofloxacin; Lipitor; Niaspan; Oxycodone-acetaminophen; and Sulfonamide derivatives  Home Medications   Prior to Admission medications   Medication Sig Start Date End Date Taking? Authorizing Provider  azaTHIOprine (IMURAN) 50 MG tablet One tab po bid xone week, then 2 tabs po bid 04/11/15  Yes Marcial Pacas, MD  feeding supplement, ENSURE COMPLETE, (ENSURE COMPLETE) LIQD Take 237 mLs by mouth daily. 10/05/14  Yes Barton Dubois, MD  ferrous sulfate 325 (65 FE) MG tablet Take 325 mg by mouth 2 (two) times daily with a meal.    Yes Ladell Pier, MD  finasteride (PROSCAR) 5 MG tablet Take 5 mg by mouth daily.     Yes Historical Provider, MD  furosemide (LASIX) 40 MG tablet Take 1 tablet (40 mg total) by mouth daily. 10/05/14  Yes Barton Dubois, MD  hydrALAZINE (APRESOLINE) 25 MG tablet Take 1 tablet (25 mg total) by mouth 2 (two) times daily. 12/12/14  Yes Darlin Coco, MD  isosorbide dinitrate (ISORDIL) 20 MG tablet Take 10 mg by mouth 2 (two) times daily. 01/09/15  Yes Historical Provider, MD  metoprolol succinate (TOPROL-XL) 50 MG 24 hr  tablet Take 1 tablet (50 mg total) by mouth 2 (two) times daily. 05/12/15  Yes Amber Sena Slate, NP  nitroGLYCERIN (NITROSTAT) 0.4 MG SL tablet Place 1 tablet (0.4 mg total) under the tongue every 5 (five) minutes as needed. For chest pain 02/13/13  Yes Darlin Coco, MD  predniSONE (DELTASONE) 10 MG tablet 2 tabs po qam x 2 weeks, then one tab po qam 04/11/15  Yes Marcial Pacas, MD  pyridostigmine (MESTINON) 60 MG tablet 1/2 to one tab po tid 04/11/15  Yes Marcial Pacas, MD  simvastatin (ZOCOR) 20 MG tablet Take 1 tablet (20 mg total) by mouth at bedtime. 07/11/14  Yes Darlin Coco, MD  tamsulosin (FLOMAX) 0.4 MG CAPS capsule Take 1 capsule (0.4 mg total) by mouth daily after supper. 10/05/14  Yes Barton Dubois, MD  vitamin C (ASCORBIC ACID) 500 MG tablet Take 500 mg by mouth daily.     Yes Historical Provider, MD  Vitamins-Lipotropics (B-100 COMPLEX) TABS Take 1 tablet by mouth daily.    Yes Historical Provider, MD  warfarin (COUMADIN) 3 MG tablet TAKE AS DIRECTED PER COUMADIN CLINIC Patient taking differently: Take 1 1/2 tablet everyday except Thursday and Saturday takes one tablet 03/10/15  Yes Darlin Coco, MD   BP 108/80 mmHg  Pulse 78  Temp(Src) 88.7 F (31.5 C) (Axillary)  Resp 18  Ht 5\' 10"  (1.778 m)  Wt 162 lb 11.2 oz (73.8 kg)  BMI 23.34 kg/m2  SpO2 82%  Vitals reviewed Physical Exam  Physical Examination: General appearance - alert, chronically ill appearing, and in no distress Mental status - alert, oriented to person, place, and time Eyes - no scleral icterus, no conjunctival injection Mouth - mucous membranes moist, pharynx normal without lesions Chest - clear to auscultation, no wheezes, rales or rhonchi, symmetric air entry Heart - normal rate, regular rhythm, normal S1, S2, no murmurs, rubs, clicks or gallops Abdomen - soft, nontender, nondistended, no masses or organomegaly Extremities - peripheral pulses normal, no pedal edema, no clubbing or cyanosis Skin - normal  coloration and turgor, no rashes  ED Course  Procedures (including critical care time)  11:54 PM d/w cardiology- Dr. Philbert Riser, he recommends diuresis- admission to medicine due to other medical issues.  Cardiology will consult in the morning.  If there are other acute issues overnight, he is in house.  Pacer is being interogated, he has reviewed EKG and feels the pacer is working appropriately.   12:22 AM d/w Dr. Blaine Hamper, triad, pt to be admitted to triad, telemetry bed.  Labs Review Labs Reviewed  BASIC METABOLIC PANEL - Abnormal; Notable for the following:    Chloride  100 (*)    BUN 40 (*)    Creatinine, Ser 2.82 (*)    GFR calc non Af Amer 20 (*)    GFR calc Af Amer 23 (*)    All other components within normal limits  BRAIN NATRIURETIC PEPTIDE - Abnormal; Notable for the following:    B Natriuretic Peptide 1324.5 (*)    All other components within normal limits  CBC WITH DIFFERENTIAL/PLATELET - Abnormal; Notable for the following:    WBC 0.1 (*)    RBC 2.04 (*)    Hemoglobin 6.7 (*)    HCT 20.8 (*)    MCV 102.0 (*)    Platelets 8 (*)    Neutrophils Relative % 23 (*)    Lymphocytes Relative 69 (*)    Neutro Abs 0.0 (*)    Lymphs Abs 0.1 (*)    Monocytes Absolute 0.0 (*)    All other components within normal limits  PROTIME-INR - Abnormal; Notable for the following:    Prothrombin Time 19.5 (*)    INR 1.64 (*)    All other components within normal limits  CBC - Abnormal; Notable for the following:    WBC 0.2 (*)    RBC 2.12 (*)    Hemoglobin 6.9 (*)    HCT 21.6 (*)    MCV 101.9 (*)    Platelets 8 (*)    All other components within normal limits  HAPTOGLOBIN - Abnormal; Notable for the following:    Haptoglobin 252 (*)    All other components within normal limits  TROPONIN I - Abnormal; Notable for the following:    Troponin I 0.40 (*)    All other components within normal limits  TROPONIN I - Abnormal; Notable for the following:    Troponin I 0.84 (*)    All other  components within normal limits  URINALYSIS, ROUTINE W REFLEX MICROSCOPIC - Abnormal; Notable for the following:    Color, Urine AMBER (*)    APPearance CLOUDY (*)    Hgb urine dipstick SMALL (*)    Protein, ur 30 (*)    All other components within normal limits  URINE MICROSCOPIC-ADD ON - Abnormal; Notable for the following:    Squamous Epithelial / LPF FEW (*)    Bacteria, UA FEW (*)    Casts HYALINE CASTS (*)    All other components within normal limits  CBC - Abnormal; Notable for the following:    WBC 0.1 (*)    RBC 2.67 (*)    Hemoglobin 8.6 (*)    HCT 25.5 (*)    RDW 16.7 (*)    Platelets 36 (*)    All other components within normal limits  CBC - Abnormal; Notable for the following:    WBC 0.2 (*)    RBC 2.74 (*)    Hemoglobin 8.9 (*)    HCT 26.4 (*)    RDW 16.9 (*)    Platelets 56 (*)    All other components within normal limits  CBC - Abnormal; Notable for the following:    WBC 0.3 (*)    RBC 3.14 (*)    Hemoglobin 9.9 (*)    HCT 30.3 (*)    RDW 16.7 (*)    Platelets 38 (*)    All other components within normal limits  BASIC METABOLIC PANEL - Abnormal; Notable for the following:    Chloride 95 (*)    BUN 54 (*)    Creatinine, Ser 4.10 (*)  GFR calc non Af Amer 13 (*)    GFR calc Af Amer 15 (*)    Anion gap 19 (*)    All other components within normal limits  RETICULOCYTES - Abnormal; Notable for the following:    Retic Ct Pct <0.4 (*)    RBC. 3.16 (*)    Retic Count, Manual 6.3 (*)    All other components within normal limits  FERRITIN - Abnormal; Notable for the following:    Ferritin 2277 (*)    All other components within normal limits  IRON AND TIBC - Abnormal; Notable for the following:    TIBC 168 (*)    Saturation Ratios 63 (*)    All other components within normal limits  ERYTHROPOIETIN - Abnormal; Notable for the following:    Erythropoietin 652.8 (*)    All other components within normal limits  BASIC METABOLIC PANEL - Abnormal; Notable  for the following:    CO2 19 (*)    Glucose, Bld 131 (*)    BUN 62 (*)    Creatinine, Ser 4.63 (*)    Calcium 8.6 (*)    GFR calc non Af Amer 11 (*)    GFR calc Af Amer 13 (*)    Anion gap 20 (*)    All other components within normal limits  CARBOXYHEMOGLOBIN - Abnormal; Notable for the following:    Total hemoglobin 10.9 (*)    All other components within normal limits  BLOOD GAS, ARTERIAL - Abnormal; Notable for the following:    pH, Arterial 7.305 (*)    pCO2 arterial 30.0 (*)    pO2, Arterial 444 (*)    Bicarbonate 14.6 (*)    Acid-base deficit 10.6 (*)    All other components within normal limits  LACTIC ACID, PLASMA - Abnormal; Notable for the following:    Lactic Acid, Venous 8.5 (*)    All other components within normal limits  BASIC METABOLIC PANEL - Abnormal; Notable for the following:    Chloride 100 (*)    CO2 18 (*)    Glucose, Bld 258 (*)    BUN 67 (*)    Creatinine, Ser 4.61 (*)    Calcium 7.7 (*)    GFR calc non Af Amer 11 (*)    GFR calc Af Amer 13 (*)    Anion gap 19 (*)    All other components within normal limits  PROTIME-INR - Abnormal; Notable for the following:    Prothrombin Time 24.8 (*)    INR 2.27 (*)    All other components within normal limits  HEPATIC FUNCTION PANEL - Abnormal; Notable for the following:    Total Protein 5.2 (*)    Albumin 2.3 (*)    AST 155 (*)    ALT 81 (*)    Alkaline Phosphatase 30 (*)    Total Bilirubin 5.6 (*)    Bilirubin, Direct 3.9 (*)    Indirect Bilirubin 1.7 (*)    All other components within normal limits  PHOSPHORUS - Abnormal; Notable for the following:    Phosphorus 6.9 (*)    All other components within normal limits  LACTIC ACID, PLASMA - Abnormal; Notable for the following:    Lactic Acid, Venous 7.8 (*)    All other components within normal limits  TROPONIN I - Abnormal; Notable for the following:    Troponin I 1.62 (*)    All other components within normal limits  TROPONIN I - Abnormal;  Notable for the  following:    Troponin I 2.31 (*)    All other components within normal limits  TROPONIN I - Abnormal; Notable for the following:    Troponin I 3.07 (*)    All other components within normal limits  GLUCOSE, CAPILLARY - Abnormal; Notable for the following:    Glucose-Capillary 61 (*)    All other components within normal limits  GLUCOSE, CAPILLARY - Abnormal; Notable for the following:    Glucose-Capillary 38 (*)    All other components within normal limits  GLUCOSE, CAPILLARY - Abnormal; Notable for the following:    Glucose-Capillary 234 (*)    All other components within normal limits  BASIC METABOLIC PANEL - Abnormal; Notable for the following:    Chloride 98 (*)    CO2 21 (*)    Glucose, Bld 102 (*)    BUN 72 (*)    Creatinine, Ser 4.88 (*)    Calcium 7.3 (*)    GFR calc non Af Amer 10 (*)    GFR calc Af Amer 12 (*)    Anion gap 18 (*)    All other components within normal limits  CARBOXYHEMOGLOBIN - Abnormal; Notable for the following:    Total hemoglobin 9.2 (*)    All other components within normal limits  CBC WITH DIFFERENTIAL/PLATELET - Abnormal; Notable for the following:    WBC 0.1 (*)    RBC 2.72 (*)    Hemoglobin 8.7 (*)    HCT 25.6 (*)    RDW 15.7 (*)    Platelets <5 (*)    Neutrophils Relative % 0 (*)    Lymphocytes Relative 88 (*)    Eosinophils Relative 8 (*)    nRBC 2 (*)    Neutro Abs 0.0 (*)    Lymphs Abs 0.1 (*)    Monocytes Absolute 0.0 (*)    All other components within normal limits  URINALYSIS, ROUTINE W REFLEX MICROSCOPIC - Abnormal; Notable for the following:    Color, Urine RED (*)    APPearance CLOUDY (*)    Hgb urine dipstick MODERATE (*)    Bilirubin Urine SMALL (*)    Ketones, ur 15 (*)    Protein, ur 100 (*)    Leukocytes, UA TRACE (*)    All other components within normal limits  PHOSPHORUS - Abnormal; Notable for the following:    Phosphorus 5.3 (*)    All other components within normal limits  TROPONIN I -  Abnormal; Notable for the following:    Troponin I 2.49 (*)    All other components within normal limits  TROPONIN I - Abnormal; Notable for the following:    Troponin I 1.65 (*)    All other components within normal limits  LACTIC ACID, PLASMA - Abnormal; Notable for the following:    Lactic Acid, Venous 7.6 (*)    All other components within normal limits  LACTIC ACID, PLASMA - Abnormal; Notable for the following:    Lactic Acid, Venous 6.6 (*)    All other components within normal limits  GLUCOSE, CAPILLARY - Abnormal; Notable for the following:    Glucose-Capillary 104 (*)    All other components within normal limits  RENAL FUNCTION PANEL - Abnormal; Notable for the following:    Chloride 99 (*)    Glucose, Bld 146 (*)    BUN 59 (*)    Creatinine, Ser 3.50 (*)    Calcium 7.1 (*)    Albumin 1.9 (*)    GFR calc  non Af Amer 15 (*)    GFR calc Af Amer 18 (*)    All other components within normal limits  CARBOXYHEMOGLOBIN - Abnormal; Notable for the following:    Total hemoglobin 9.0 (*)    All other components within normal limits  GLUCOSE, CAPILLARY - Abnormal; Notable for the following:    Glucose-Capillary 111 (*)    All other components within normal limits  GLUCOSE, CAPILLARY - Abnormal; Notable for the following:    Glucose-Capillary 116 (*)    All other components within normal limits  TROPONIN I - Abnormal; Notable for the following:    Troponin I 0.93 (*)    All other components within normal limits  CBC - Abnormal; Notable for the following:    WBC 0.2 (*)    RBC 2.88 (*)    Hemoglobin 9.0 (*)    HCT 25.9 (*)    RDW 15.9 (*)    Platelets 9 (*)    All other components within normal limits  CARBOXYHEMOGLOBIN - Abnormal; Notable for the following:    Total hemoglobin 9.3 (*)    All other components within normal limits  APTT - Abnormal; Notable for the following:    aPTT 46 (*)    All other components within normal limits  PHOSPHORUS - Abnormal; Notable for  the following:    Phosphorus 2.4 (*)    All other components within normal limits  COMPREHENSIVE METABOLIC PANEL - Abnormal; Notable for the following:    Glucose, Bld 125 (*)    BUN 37 (*)    Creatinine, Ser 2.03 (*)    Calcium 7.5 (*)    Total Protein 4.4 (*)    Albumin 1.7 (*)    AST 482 (*)    ALT 332 (*)    Alkaline Phosphatase 24 (*)    Total Bilirubin 8.1 (*)    GFR calc non Af Amer 30 (*)    GFR calc Af Amer 34 (*)    All other components within normal limits  CBC WITH DIFFERENTIAL/PLATELET - Abnormal; Notable for the following:    WBC 0.2 (*)    RBC 2.94 (*)    Hemoglobin 9.3 (*)    HCT 26.5 (*)    RDW 16.1 (*)    Platelets 9 (*)    Neutrophils Relative % 4 (*)    Lymphocytes Relative 80 (*)    Eosinophils Relative 12 (*)    Neutro Abs 0.0 (*)    Lymphs Abs 0.2 (*)    Monocytes Absolute 0.0 (*)    All other components within normal limits  TROPONIN I - Abnormal; Notable for the following:    Troponin I 0.71 (*)    All other components within normal limits  PHOSPHORUS - Abnormal; Notable for the following:    Phosphorus 2.4 (*)    All other components within normal limits  GLUCOSE, CAPILLARY - Abnormal; Notable for the following:    Glucose-Capillary 133 (*)    All other components within normal limits  GLUCOSE, CAPILLARY - Abnormal; Notable for the following:    Glucose-Capillary 138 (*)    All other components within normal limits  RENAL FUNCTION PANEL - Abnormal; Notable for the following:    CO2 21 (*)    Glucose, Bld 143 (*)    BUN 28 (*)    Creatinine, Ser 1.50 (*)    Calcium 7.5 (*)    Albumin 1.8 (*)    GFR calc non Af Amer 43 (*)  GFR calc Af Amer 50 (*)    All other components within normal limits  GLUCOSE, CAPILLARY - Abnormal; Notable for the following:    Glucose-Capillary 121 (*)    All other components within normal limits  GLUCOSE, CAPILLARY - Abnormal; Notable for the following:    Glucose-Capillary 159 (*)    All other components  within normal limits  GLUCOSE, CAPILLARY - Abnormal; Notable for the following:    Glucose-Capillary 136 (*)    All other components within normal limits  GLUCOSE, CAPILLARY - Abnormal; Notable for the following:    Glucose-Capillary 129 (*)    All other components within normal limits  CARBOXYHEMOGLOBIN - Abnormal; Notable for the following:    Total hemoglobin 12.7 (*)    All other components within normal limits  RENAL FUNCTION PANEL - Abnormal; Notable for the following:    Glucose, Bld 136 (*)    BUN 24 (*)    Calcium 7.7 (*)    Albumin 1.7 (*)    GFR calc non Af Amer 56 (*)    All other components within normal limits  CBC - Abnormal; Notable for the following:    WBC 0.3 (*)    RBC 3.24 (*)    Hemoglobin 10.2 (*)    HCT 28.6 (*)    Platelets <5 (*)    All other components within normal limits  APTT - Abnormal; Notable for the following:    aPTT 41 (*)    All other components within normal limits  RENAL FUNCTION PANEL - Abnormal; Notable for the following:    Glucose, Bld 163 (*)    Calcium 7.4 (*)    Albumin 1.6 (*)    All other components within normal limits  GLUCOSE, CAPILLARY - Abnormal; Notable for the following:    Glucose-Capillary 137 (*)    All other components within normal limits  GLUCOSE, CAPILLARY - Abnormal; Notable for the following:    Glucose-Capillary 140 (*)    All other components within normal limits  GLUCOSE, CAPILLARY - Abnormal; Notable for the following:    Glucose-Capillary 130 (*)    All other components within normal limits  GLUCOSE, CAPILLARY - Abnormal; Notable for the following:    Glucose-Capillary 146 (*)    All other components within normal limits  GLUCOSE, CAPILLARY - Abnormal; Notable for the following:    Glucose-Capillary 152 (*)    All other components within normal limits  CARBOXYHEMOGLOBIN - Abnormal; Notable for the following:    Total hemoglobin 11.4 (*)    All other components within normal limits  MAGNESIUM -  Abnormal; Notable for the following:    Magnesium 2.5 (*)    All other components within normal limits  APTT - Abnormal; Notable for the following:    aPTT 42 (*)    All other components within normal limits  CBC - Abnormal; Notable for the following:    WBC 0.2 (*)    RBC 3.47 (*)    Hemoglobin 10.7 (*)    HCT 30.7 (*)    Platelets 8 (*)    All other components within normal limits  COMPREHENSIVE METABOLIC PANEL - Abnormal; Notable for the following:    Sodium 133 (*)    CO2 18 (*)    Glucose, Bld 139 (*)    Calcium 7.6 (*)    Total Protein 4.0 (*)    Albumin 1.7 (*)    AST 179 (*)    ALT 341 (*)    Alkaline  Phosphatase 31 (*)    Total Bilirubin 10.3 (*)    All other components within normal limits  GLUCOSE, CAPILLARY - Abnormal; Notable for the following:    Glucose-Capillary 123 (*)    All other components within normal limits  RENAL FUNCTION PANEL - Abnormal; Notable for the following:    CO2 13 (*)    Glucose, Bld 56 (*)    Calcium 7.3 (*)    Phosphorus 4.8 (*)    Albumin 1.5 (*)    Anion gap 18 (*)    All other components within normal limits  GLUCOSE, CAPILLARY - Abnormal; Notable for the following:    Glucose-Capillary 142 (*)    All other components within normal limits  GLUCOSE, CAPILLARY - Abnormal; Notable for the following:    Glucose-Capillary 134 (*)    All other components within normal limits  RETICULOCYTES - Abnormal; Notable for the following:    Retic Ct Pct <0.4 (*)    RBC. 3.50 (*)    All other components within normal limits  GLUCOSE, CAPILLARY - Abnormal; Notable for the following:    Glucose-Capillary 102 (*)    All other components within normal limits  GLUCOSE, CAPILLARY - Abnormal; Notable for the following:    Glucose-Capillary 46 (*)    All other components within normal limits  GLUCOSE, CAPILLARY - Abnormal; Notable for the following:    Glucose-Capillary 124 (*)    All other components within normal limits  MAGNESIUM - Abnormal;  Notable for the following:    Magnesium 2.5 (*)    All other components within normal limits  APTT - Abnormal; Notable for the following:    aPTT 58 (*)    All other components within normal limits  PHOSPHORUS - Abnormal; Notable for the following:    Phosphorus 4.8 (*)    All other components within normal limits  COMPREHENSIVE METABOLIC PANEL - Abnormal; Notable for the following:    Sodium 132 (*)    Potassium 5.7 (*)    CO2 12 (*)    Glucose, Bld 112 (*)    Calcium 6.7 (*)    Total Protein 3.2 (*)    Albumin 1.4 (*)    AST 4980 (*)    ALT 1886 (*)    Total Bilirubin 11.6 (*)    Anion gap 16 (*)    All other components within normal limits  CBC - Abnormal; Notable for the following:    WBC 0.2 (*)    RBC 2.78 (*)    Hemoglobin 8.7 (*)    HCT 25.8 (*)    RDW 15.7 (*)    Platelets <5 (*)    All other components within normal limits  GLUCOSE, CAPILLARY - Abnormal; Notable for the following:    Glucose-Capillary 48 (*)    All other components within normal limits  GLUCOSE, CAPILLARY - Abnormal; Notable for the following:    Glucose-Capillary 179 (*)    All other components within normal limits  GLUCOSE, CAPILLARY - Abnormal; Notable for the following:    Glucose-Capillary 104 (*)    All other components within normal limits  GLUCOSE, CAPILLARY - Abnormal; Notable for the following:    Glucose-Capillary 118 (*)    All other components within normal limits  POCT I-STAT 3, ART BLOOD GAS (G3+) - Abnormal; Notable for the following:    pH, Arterial 7.346 (*)    pCO2 arterial 32.1 (*)    pO2, Arterial 106.0 (*)    Bicarbonate 18.1 (*)  Acid-base deficit 7.0 (*)    All other components within normal limits  POCT I-STAT 3, ART BLOOD GAS (G3+) - Abnormal; Notable for the following:    pH, Arterial 7.232 (*)    pCO2 arterial 23.6 (*)    Bicarbonate 9.9 (*)    Acid-base deficit 16.0 (*)    All other components within normal limits  MRSA PCR SCREENING  CULTURE,  RESPIRATORY (NON-EXPECTORATED)  CULTURE, BLOOD (ROUTINE X 2)  CULTURE, BLOOD (ROUTINE X 2)  CULTURE, BLOOD (ROUTINE X 2)  LACTATE DEHYDROGENASE  GLUCOSE, CAPILLARY  SAVE SMEAR  PATHOLOGIST SMEAR REVIEW  VITAMIN B12  MAGNESIUM  GLUCOSE, CAPILLARY  GLUCOSE, CAPILLARY  MAGNESIUM  PHOSPHORUS  URINE MICROSCOPIC-ADD ON  PHOSPHORUS  GLUCOSE, CAPILLARY  OCCULT BLOOD GASTRIC / DUODENUM (SPECIMEN CUP)  GLUCOSE, CAPILLARY  MAGNESIUM  GLUCOSE, CAPILLARY  MAGNESIUM  PHOSPHORUS  GLUCOSE, CAPILLARY  GLUCOSE, CAPILLARY  GLUCOSE, CAPILLARY  I-STAT TROPOININ, ED  TYPE AND SCREEN  PREPARE RBC (CROSSMATCH)  PREPARE PLATELET PHERESIS  PREPARE PLATELET PHERESIS  PREPARE RBC (CROSSMATCH)  PREPARE PLATELET PHERESIS    Imaging Review No results found.   EKG Interpretation   Date/Time:  Tuesday May 13 2015 21:33:00 EDT Ventricular Rate:  83 PR Interval:    QRS Duration: 131 QT Interval:  402 QTC Calculation: 472 R Axis:   -53 Text Interpretation:  Atrial fibrillation Ventricular bigeminy Nonspecific  IVCD with LAD LVH with secondary repolarization abnormality Probable  anterior infarct, age indeterminate No significant change since last  tracing Confirmed by [facility]  MD, [place] [phone]) on 04/28/2015 9:35:48 PM      MDM   chf exacerbation  Pt with hx of chf presenting with dyspnea, weight gain, overall faitigue.  As per d/w cardiology above- pt to be admitted to medicine for diuresis, lasix given in the ED, charge nurse to transmit info for pacer interogation.    Alfonzo Beers, MD 05/22/15 [phone]

## 2015-05-13 NOTE — ED Notes (Signed)
Pt reports shortness of breath; no chest pain; no nausea or vomiting; on 4 L [place] by EMS and pt reports helped him. Deminished in all fields; diminished in all fields. CHF pt.

## 2015-05-13 NOTE — Progress Notes (Signed)
Chief Complaint  Patient presents with  . Myasthenia Gravis    Says he has been having dizzy spells and headaches since starting his medications.  He feels his weakness has improved some.  He would like to discuss his test results in more detail.      PATIENT: Nicholas [name] DOB: 1934-09-20  HISTORICAL ([facility] visit was April 11 2015)  Nicholas [name] is a 81 yo RH male, is accompanied by his wife, referred by his ophthalmologist Dr. Ellie [name] and primary care physician Dr. Claris [name] for evaluation myasthenia gravis  He had a complicated past medical history, ischemic heart disease, coronary bypass surgery June 1993, history of congestive heart failure,atrial fibrillation since 2013, was previously treated with Taxol total, chronic renal failure, now is on Coumadin treatment, status post pacemaker placement October 2015 due to high-grade AV block  He also had a history of anal colon cancer, status post colectomy in 2008, with colostomy, Left shoulder injury, limited range of motion of his left shoulder, right knee replacement  At baseline, he is still fairly active, driving, take care of household.   In September 2015, he had left cataract surgery, posterior surgically, he noticed intermittent left eyelid droopy, intermittent binocular double vision, while reading, watching TV, driving,  He also noticed voice change, turn to soft, difficulty chewing, foot stuck to his throat, generalized weakness, no significant gait difficulty,  He has lost 70 pounds since October 2015 unintentionally, he was recently evaluated by his ophthalmologist Dr. Ellie [name], acetylcholine receptor antibody was 3.7 5, normal TSH 1.78  UPDATE May 13 2015: His symptoms has much improved, he is now taking prednisone 10 mg every day, Imuran 50 mg 2 tablets twice a day, he is on higher dose of Toprol 50 mg twice a day due to his tachycardia, he is now complains of more lightheadedness, he can release better, no double  vision, complains of mild dizziness, GI side effect with Mestinon 60 mg 3 times a day  He has gained 10 pounds over one month, now weight 145 pounds  REVIEW OF SYSTEMS: Full 14 system review of systems performed and notable only for fatigue, running nose, cough, shortness of breath, restless legs, daytime sleepiness, achy muscles, dizziness, headaches,  ALLERGIES: Allergies  Allergen Reactions  . Cephalexin Other (See Comments)    "too strong for my system" - per office note 05/05/2012  . Ciprofloxacin Other (See Comments)    Reaction unknown  . Hctz [Hydrochlorothiazide]     DIZZINESS   . Levofloxacin Other (See Comments)    "too strong for my system" - per office note 05/05/2012  . Lipitor [Atorvastatin Calcium]     MUSCLE PAIN  . Niaspan [Niacin Er]     FLUSHING   . Oxycodone-Acetaminophen Other (See Comments)    Reaction unknown  . Sulfonamide Derivatives Other (See Comments)    Reaction unkonwn    HOME MEDICATIONS: Current Outpatient Prescriptions  Medication Sig Dispense Refill  . feeding supplement, ENSURE COMPLETE, (ENSURE COMPLETE) LIQD Take 237 mLs by mouth daily.    . ferrous sulfate 325 (65 FE) MG tablet Take 325 mg by mouth 2 (two) times daily with a meal.     . finasteride (PROSCAR) 5 MG tablet Take 5 mg by mouth daily.      . furosemide (LASIX) 40 MG tablet Take 1 tablet (40 mg total) by mouth daily.    . hydrALAZINE (APRESOLINE) 25 MG tablet Take 1 tablet (25 mg total) by mouth 2 (two) times  daily. 60 tablet 5  . isosorbide dinitrate (ISORDIL) 20 MG tablet Take 10 mg by mouth 2 (two) times daily.    . metoprolol succinate (TOPROL-XL) 50 MG 24 hr tablet Take 1 1/2 tablets by mouth daily with or immediately following a meal. 135 tablet 3  . nitroGLYCERIN (NITROSTAT) 0.4 MG SL tablet Place 1 tablet (0.4 mg total) under the tongue every 5 (five) minutes as needed. For chest pain 25 tablet 6  . simvastatin (ZOCOR) 20 MG tablet Take 1 tablet (20 mg total) by mouth at  bedtime. 90 tablet 3  . tamsulosin (FLOMAX) 0.4 MG CAPS capsule Take 1 capsule (0.4 mg total) by mouth daily after supper. 30 capsule 1  . vitamin C (ASCORBIC ACID) 500 MG tablet Take 500 mg by mouth daily.      . Vitamins-Lipotropics (B-100 COMPLEX) TABS Take 1 tablet by mouth daily.     Marland Kitchen warfarin (COUMADIN) 3 MG tablet TAKE AS DIRECTED PER COUMADIN CLINIC 40 tablet 3     PAST MEDICAL HISTORY: Past Medical History  Diagnosis Date  . 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 Pure hypercholesterolemia     takes Vytorin daily  . Diverticulosis of colon (without mention of hemorrhage)   . Hypertension     takes Amlodipine,Metoprolol,and Cardura daily  . Coronary artery disease     a. s/p CABG  . CHF (congestive heart failure)     a. s/p STJ CRTD  . H/O blood clots 1993  . Arthritis   . Skin cancer     nose  . GERD (gastroesophageal reflux disease)        . Enlarged prostate        . Insomnia   . CKD (chronic kidney disease) stage 4, GFR 15-29 ml/min   . Permanent atrial fibrillation   . PVC (premature ventricular contraction)   . Ischemic cardiomyopathy     PAST SURGICAL HISTORY: Past Surgical History  Procedure Laterality Date  . Shoulder surgery  2007    left  . Cataract extraction      right  . Abdominoperineal proctocolectomy  06/01/2007  . Coronary angioplasty  05/22/92  . Colostomy  2008  . Colonoscopy    . Coronary artery bypass graft  1993  . Colon surgery  2008    colectomy  . Total knee arthroplasty  02/28/2012    Procedure: TOTAL KNEE ARTHROPLASTY;  Surgeon: Rudean Haskell, MD;  Location: [facility];  Service: Orthopedics;  Laterality: Right;  . Bi-ventricular pacemaker insertion N/A 10/01/2014    SJM Allure Quadra implnated by Dr Rayann Heman for symptomatic bradycadria    FAMILY HISTORY: Family History  Problem Relation Age of Onset  . Pancreatic cancer Father   . Diabetes Son   . Emphysema Mother   . Anesthesia problems Neg Hx   . Hypotension Neg Hx   .  Malignant hyperthermia Neg Hx   . Pseudochol deficiency Neg Hx     SOCIAL HISTORY:  History   Social History  . Marital Status: Married    Spouse Name: N/A  . Number of Children: N/A  . Years of Education: N/A   Occupational History  . retired     Warehouse manager   Social History Main Topics  . Smoking status: Former Smoker -- 10 years    Types: Pipe, Cigars    Quit date: 05/26/1991  . Smokeless tobacco: Current User    Types: Chew  . Alcohol Use: No  . Drug Use:  No  . Sexual Activity: Not on file   Other Topics Concern  . Not on file   Social History Narrative   The patient lives with his wife.     PHYSICAL EXAM   Filed Vitals:   05/07/2015 1532  BP: 124/61  Pulse: 45  Height: 5\' 10"  (1.778 m)  Weight: 144 lb (65.318 kg)    Not recorded      Body mass index is 20.66 kg/(m^2).  PHYSICAL EXAMNIATION:  Gen: NAD, conversant, well nourised, obese, well groomed                     Cardiovascular: Regular rate rhythm, no peripheral edema, warm, nontender. Eyes: Conjunctivae clear without exudates or hemorrhage Neck: Supple, no carotid bruise. Pulmonary: Clear to auscultation bilaterally   NEUROLOGICAL EXAM:  MENTAL STATUS: Speech:    Speech is normal; fluent and spontaneous with normal comprehension.  Cognition:    The patient is oriented to person, place, and time;     recent and remote memory intact;     language fluent;     normal attention, concentration,     fund of knowledge.  CRANIAL NERVES: CN II: Visual fields are full to confrontation. Fundoscopic exam is normal with sharp discs and no vascular changes. Venous pulsations are present bilaterally. Pupils are 4 mm and briskly reactive to light. Visual acuity is 20/20 bilaterally. CN III, IV, VI: extraocular movement are normal. Mild left-sided fatigable ptosis, bilateral exophoria. CN V: Facial sensation is intact to pinprick in all 3 divisions bilaterally. Corneal responses are intact.  CN VII: Face  is symmetric, he has mild bilateral eye closure, cheek puff weakness. CN VIII: Hearing is normal to rubbing fingers CN IX, X: Palate elevates symmetrically. Phonation is normal. CN XI: Head turning and shoulder shrug are intact CN XII: Tongue is midline with normal movements and no atrophy.  MOTOR: He has mild neck flexion, bilateral shoulder abduction weakness, no significant hip flexion weakness, normal muscle tone, no atrophy   REFLEXES: Reflexes are 2+ and symmetric at the biceps, triceps, knees, and ankles. Plantar responses are flexor.  SENSORY: Light touch, pinprick, position sense, and vibration sense are intact in fingers and toes.  COORDINATION: Rapid alternating movements and fine finger movements are intact. There is no dysmetria on finger-to-nose and heel-knee-shin. There are no abnormal or extraneous movements.   GAIT/STANCE: Posture is normal. Steady   DIAGNOSTIC DATA (LABS, IMAGING, TESTING) - I reviewed patient records, labs, notes, testing and imaging myself where available.  Lab Results  Component Value Date   WBC 8.1 04/15/2015   HGB 12.6* 04/15/2015   HCT 38.8 04/15/2015   MCV 101.3* 04/15/2015   PLT 117* 04/15/2015      Component Value Date/Time   NA 146* 04/15/2015 0831   NA 139 03/05/2015 1008   K 4.5 04/15/2015 0831   K 5.4* 03/05/2015 1008   CL 104 03/05/2015 1008   CL 105 10/13/2012 0901   CO2 26 04/15/2015 0831   CO2 30 03/05/2015 1008   GLUCOSE 101 04/15/2015 0831   GLUCOSE 104* 03/05/2015 1008   GLUCOSE 99 10/13/2012 0901   BUN 36.4* 04/15/2015 0831   BUN 41* 03/05/2015 1008   CREATININE 2.6* 04/15/2015 0831   CREATININE 2.82* 03/05/2015 1008   CALCIUM 11.0* 04/15/2015 0831   CALCIUM 11.1* 03/05/2015 1008   PROT 6.7 04/15/2015 0831   PROT 7.4 03/05/2015 1008   ALBUMIN 4.2 04/15/2015 0831   ALBUMIN 4.5 03/05/2015 1008  AST 22 04/15/2015 0831   AST 23 03/05/2015 1008   ALT 20 04/15/2015 0831   ALT 16 03/05/2015 1008   ALKPHOS  55 04/15/2015 0831   ALKPHOS 62 03/05/2015 1008   BILITOT 1.35* 04/15/2015 0831   BILITOT 0.8 03/05/2015 1008   GFRNONAA 20* 10/05/2014 0313   GFRAA 23* 10/05/2014 0313   Lab Results  Component Value Date   CHOL 120 03/05/2015   HDL 49.60 03/05/2015   LDLCALC 48 03/05/2015   TRIG 110.0 03/05/2015   CHOLHDL 2 03/05/2015   No results found for: HGBA1C Lab Results  Component Value Date   NLZJQBHA19 379 09/28/2014   Lab Results  Component Value Date   TSH 1.950 04/11/2015      ASSESSMENT AND PLAN  Nicholas [name] is a 81 y.o. male with complicated past medical history, including atrial fibrillation, status post pacemaker, on chronic Coumadin , chronic renal insufficiency, coronary artery disease, presenting with serum positive generalized myasthenia gravis, moderate bulbar and then muscle weakness, more than 70 pound weight loss since October 2015, he is tolerating Imuran 50 mg 2 tablets twice a day, tapering dose of prednisone, he is now only taking 10 tablets daily   1, his treatment of myasthenia gravis is complicated by his multiple comorbidities, prednisone 10 mg every day 2. Continue Mestinon 60 mg half tablets 3 times a day 3, long-term him on suppressive treatment, prednisone sparing agent, Imuran 50 mg 2 tablets twice a day 4. Return to clinic in 4 weeks, laboratory evaluation from his nephrologist Dr. Graylon Gunning    Marcial Pacas, M.D. Ph.D.  [facility] [address], [place], [place] [address] Ph: [phone] Fax: [phone]

## 2015-05-14 ENCOUNTER — Inpatient Hospital Stay (HOSPITAL_COMMUNITY): Payer: Medicare Other

## 2015-05-14 ENCOUNTER — Other Ambulatory Visit: Payer: Self-pay | Admitting: Hematology and Oncology

## 2015-05-14 ENCOUNTER — Encounter (HOSPITAL_COMMUNITY): Payer: Self-pay | Admitting: Internal Medicine

## 2015-05-14 DIAGNOSIS — D630 Anemia in neoplastic disease: Secondary | ICD-10-CM | POA: Diagnosis not present

## 2015-05-14 DIAGNOSIS — N400 Enlarged prostate without lower urinary tract symptoms: Secondary | ICD-10-CM

## 2015-05-14 DIAGNOSIS — I10 Essential (primary) hypertension: Secondary | ICD-10-CM

## 2015-05-14 DIAGNOSIS — E46 Unspecified protein-calorie malnutrition: Secondary | ICD-10-CM

## 2015-05-14 DIAGNOSIS — Z85048 Personal history of other malignant neoplasm of rectum, rectosigmoid junction, and anus: Secondary | ICD-10-CM

## 2015-05-14 DIAGNOSIS — I5022 Chronic systolic (congestive) heart failure: Secondary | ICD-10-CM | POA: Diagnosis present

## 2015-05-14 DIAGNOSIS — D61811 Other drug-induced pancytopenia: Principal | ICD-10-CM

## 2015-05-14 DIAGNOSIS — R0602 Shortness of breath: Secondary | ICD-10-CM | POA: Diagnosis present

## 2015-05-14 DIAGNOSIS — N189 Chronic kidney disease, unspecified: Secondary | ICD-10-CM

## 2015-05-14 DIAGNOSIS — D638 Anemia in other chronic diseases classified elsewhere: Secondary | ICD-10-CM

## 2015-05-14 DIAGNOSIS — D61818 Other pancytopenia: Secondary | ICD-10-CM | POA: Diagnosis present

## 2015-05-14 DIAGNOSIS — I509 Heart failure, unspecified: Secondary | ICD-10-CM

## 2015-05-14 DIAGNOSIS — I4891 Unspecified atrial fibrillation: Secondary | ICD-10-CM | POA: Diagnosis not present

## 2015-05-14 DIAGNOSIS — D696 Thrombocytopenia, unspecified: Secondary | ICD-10-CM

## 2015-05-14 DIAGNOSIS — N184 Chronic kidney disease, stage 4 (severe): Secondary | ICD-10-CM

## 2015-05-14 DIAGNOSIS — E43 Unspecified severe protein-calorie malnutrition: Secondary | ICD-10-CM | POA: Insufficient documentation

## 2015-05-14 DIAGNOSIS — D649 Anemia, unspecified: Secondary | ICD-10-CM

## 2015-05-14 DIAGNOSIS — D72819 Decreased white blood cell count, unspecified: Secondary | ICD-10-CM

## 2015-05-14 DIAGNOSIS — G7000 Myasthenia gravis without (acute) exacerbation: Secondary | ICD-10-CM | POA: Diagnosis present

## 2015-05-14 LAB — LACTATE DEHYDROGENASE: LDH: 145 U/L (ref 98–192)

## 2015-05-14 LAB — URINALYSIS, ROUTINE W REFLEX MICROSCOPIC
Bilirubin Urine: NEGATIVE
Glucose, UA: NEGATIVE mg/dL
Ketones, ur: NEGATIVE mg/dL
Leukocytes, UA: NEGATIVE
Nitrite: NEGATIVE
Protein, ur: 30 mg/dL — AB
Specific Gravity, Urine: 1.011 (ref 1.005–1.030)
Urobilinogen, UA: 1 mg/dL (ref 0.0–1.0)
pH: 5 (ref 5.0–8.0)

## 2015-05-14 LAB — URINE MICROSCOPIC-ADD ON

## 2015-05-14 LAB — CBC WITH DIFFERENTIAL/PLATELET
BASOS PCT: 0 % (ref 0–1)
Basophils Absolute: 0 10*3/uL (ref 0.0–0.1)
EOS ABS: 0 10*3/uL (ref 0.0–0.7)
Eosinophils Relative: 0 % (ref 0–5)
HCT: 20.8 % — ABNORMAL LOW (ref 39.0–52.0)
Hemoglobin: 6.7 g/dL — CL (ref 13.0–17.0)
LYMPHS PCT: 69 % — AB (ref 12–46)
Lymphs Abs: 0.1 10*3/uL — ABNORMAL LOW (ref 0.7–4.0)
MCH: 32.8 pg (ref 26.0–34.0)
MCHC: 32.2 g/dL (ref 30.0–36.0)
MCV: 102 fL — AB (ref 78.0–100.0)
Monocytes Absolute: 0 10*3/uL — ABNORMAL LOW (ref 0.1–1.0)
Monocytes Relative: 8 % (ref 3–12)
Neutro Abs: 0 10*3/uL — ABNORMAL LOW (ref 1.7–7.7)
Neutrophils Relative %: 23 % — ABNORMAL LOW (ref 43–77)
PLATELETS: 8 10*3/uL — AB (ref 150–400)
RBC: 2.04 MIL/uL — AB (ref 4.22–5.81)
RDW: 14.1 % (ref 11.5–15.5)
WBC: 0.1 10*3/uL — AB (ref 4.0–10.5)

## 2015-05-14 LAB — CBC
HCT: 21.6 % — ABNORMAL LOW (ref 39.0–52.0)
HCT: 26.4 % — ABNORMAL LOW (ref 39.0–52.0)
HEMATOCRIT: 25.5 % — AB (ref 39.0–52.0)
Hemoglobin: 6.9 g/dL — CL (ref 13.0–17.0)
Hemoglobin: 8.6 g/dL — ABNORMAL LOW (ref 13.0–17.0)
Hemoglobin: 8.9 g/dL — ABNORMAL LOW (ref 13.0–17.0)
MCH: 32.5 pg (ref 26.0–34.0)
MCH: 32.2 pg (ref 26.0–34.0)
MCH: 32.5 pg (ref 26.0–34.0)
MCHC: 31.9 g/dL (ref 30.0–36.0)
MCHC: 33.7 g/dL (ref 30.0–36.0)
MCHC: 33.7 g/dL (ref 30.0–36.0)
MCV: 101.9 fL — AB (ref 78.0–100.0)
MCV: 95.5 fL (ref 78.0–100.0)
MCV: 96.4 fL (ref 78.0–100.0)
Platelets: 8 10*3/uL — CL (ref 150–400)
Platelets: 36 10*3/uL — ABNORMAL LOW (ref 150–400)
Platelets: 56 10*3/uL — ABNORMAL LOW (ref 150–400)
RBC: 2.12 MIL/uL — AB (ref 4.22–5.81)
RBC: 2.67 MIL/uL — ABNORMAL LOW (ref 4.22–5.81)
RBC: 2.74 MIL/uL — AB (ref 4.22–5.81)
RDW: 14.3 % (ref 11.5–15.5)
RDW: 16.7 % — ABNORMAL HIGH (ref 11.5–15.5)
RDW: 16.9 % — AB (ref 11.5–15.5)
WBC: 0.2 10*3/uL — AB (ref 4.0–10.5)
WBC: 0.1 10*3/uL — AB (ref 4.0–10.5)
WBC: 0.2 10*3/uL — CL (ref 4.0–10.5)

## 2015-05-14 LAB — PATHOLOGIST SMEAR REVIEW

## 2015-05-14 LAB — PREPARE RBC (CROSSMATCH)

## 2015-05-14 LAB — TROPONIN I
Troponin I: 0.4 ng/mL — ABNORMAL HIGH (ref <0.031)
Troponin I: 0.84 ng/mL (ref <0.031)

## 2015-05-14 LAB — GLUCOSE, CAPILLARY: Glucose-Capillary: 76 mg/dL (ref 65–99)

## 2015-05-14 LAB — PROTIME-INR
INR: 1.64 — AB (ref 0.00–1.49)
PROTHROMBIN TIME: 19.5 s — AB (ref 11.6–15.2)

## 2015-05-14 LAB — SAVE SMEAR

## 2015-05-14 LAB — MRSA PCR SCREENING: MRSA BY PCR: NEGATIVE

## 2015-05-14 MED ORDER — TAMSULOSIN HCL 0.4 MG PO CAPS
0.4000 mg | ORAL_CAPSULE | Freq: Every day | ORAL | Status: DC
  Filled 2015-05-14 (×2): qty 1

## 2015-05-14 MED ORDER — B COMPLEX-C PO TABS
ORAL_TABLET | Freq: Every day | ORAL | Status: DC
  Administered 2015-05-14 – 2015-05-17 (×4): 1 via ORAL
  Filled 2015-05-14 (×4): qty 1

## 2015-05-14 MED ORDER — VITAMIN C 500 MG PO TABS
500.0000 mg | ORAL_TABLET | Freq: Every day | ORAL | Status: DC
  Administered 2015-05-14: 500 mg via ORAL
  Filled 2015-05-14 (×2): qty 1

## 2015-05-14 MED ORDER — SODIUM CHLORIDE 0.9 % IV SOLN
250.0000 mL | INTRAVENOUS | Status: DC | PRN
  Administered 2015-05-17 – 2015-05-18 (×2): 250 mL via INTRAVENOUS

## 2015-05-14 MED ORDER — AZATHIOPRINE 50 MG PO TABS
50.0000 mg | ORAL_TABLET | Freq: Two times a day (BID) | ORAL | Status: DC
  Administered 2015-05-14: 50 mg via ORAL
  Filled 2015-05-14 (×2): qty 1

## 2015-05-14 MED ORDER — PYRIDOSTIGMINE BROMIDE 60 MG PO TABS
60.0000 mg | ORAL_TABLET | Freq: Three times a day (TID) | ORAL | Status: DC
  Administered 2015-05-14: 60 mg via ORAL
  Filled 2015-05-14 (×4): qty 1

## 2015-05-14 MED ORDER — SODIUM CHLORIDE 0.9 % IV SOLN
Freq: Once | INTRAVENOUS | Status: AC
  Administered 2015-05-14: 09:00:00 via INTRAVENOUS

## 2015-05-14 MED ORDER — PYRIDOSTIGMINE BROMIDE 60 MG PO TABS
30.0000 mg | ORAL_TABLET | Freq: Three times a day (TID) | ORAL | Status: DC
  Administered 2015-05-14 – 2015-05-15 (×3): 30 mg via ORAL
  Filled 2015-05-14 (×7): qty 0.5

## 2015-05-14 MED ORDER — METOPROLOL SUCCINATE ER 50 MG PO TB24
50.0000 mg | ORAL_TABLET | Freq: Two times a day (BID) | ORAL | Status: DC
  Administered 2015-05-14 (×2): 50 mg via ORAL
  Filled 2015-05-14 (×4): qty 1

## 2015-05-14 MED ORDER — SODIUM CHLORIDE 0.9 % IJ SOLN
3.0000 mL | Freq: Two times a day (BID) | INTRAMUSCULAR | Status: DC
  Administered 2015-05-14 – 2015-05-18 (×6): 3 mL via INTRAVENOUS

## 2015-05-14 MED ORDER — PREDNISONE 10 MG PO TABS
10.0000 mg | ORAL_TABLET | Freq: Every day | ORAL | Status: DC
  Administered 2015-05-14: 10 mg via ORAL
  Filled 2015-05-14 (×3): qty 1

## 2015-05-14 MED ORDER — ENSURE ENLIVE PO LIQD: 237.0000 mL | ORAL | Status: DC

## 2015-05-14 MED ORDER — SODIUM CHLORIDE 0.9 % IJ SOLN: 3.0000 mL | INTRAMUSCULAR | Status: DC | PRN

## 2015-05-14 MED ORDER — FERROUS SULFATE 325 (65 FE) MG PO TABS
325.0000 mg | ORAL_TABLET | Freq: Two times a day (BID) | ORAL | Status: DC
  Administered 2015-05-14 – 2015-05-15 (×4): 325 mg via ORAL
  Filled 2015-05-14 (×7): qty 1

## 2015-05-14 MED ORDER — NITROGLYCERIN 0.4 MG SL SUBL: 0.4000 mg | SUBLINGUAL_TABLET | SUBLINGUAL | Status: DC | PRN

## 2015-05-14 MED ORDER — SODIUM CHLORIDE 0.9 % IV SOLN: Freq: Once | INTRAVENOUS | Status: DC

## 2015-05-14 MED ORDER — FUROSEMIDE 40 MG PO TABS
40.0000 mg | ORAL_TABLET | Freq: Every day | ORAL | Status: DC
  Administered 2015-05-14: 40 mg via ORAL
  Filled 2015-05-14 (×2): qty 1

## 2015-05-14 MED ORDER — ISOSORBIDE DINITRATE 10 MG PO TABS
10.0000 mg | ORAL_TABLET | Freq: Two times a day (BID) | ORAL | Status: DC
  Administered 2015-05-14: 10 mg via ORAL
  Filled 2015-05-14 (×5): qty 1

## 2015-05-14 MED ORDER — CETYLPYRIDINIUM CHLORIDE 0.05 % MT LIQD
7.0000 mL | Freq: Two times a day (BID) | OROMUCOSAL | Status: DC
  Administered 2015-05-14 – 2015-05-15 (×3): 7 mL via OROMUCOSAL

## 2015-05-14 MED ORDER — HYDRALAZINE HCL 25 MG PO TABS
25.0000 mg | ORAL_TABLET | Freq: Two times a day (BID) | ORAL | Status: DC
  Administered 2015-05-14 (×2): 25 mg via ORAL
  Filled 2015-05-14 (×4): qty 1

## 2015-05-14 MED ORDER — ENSURE ENLIVE PO LIQD: 237.0000 mL | Freq: Two times a day (BID) | ORAL | Status: DC

## 2015-05-14 MED ORDER — ACETAMINOPHEN 325 MG PO TABS
650.0000 mg | ORAL_TABLET | ORAL | Status: DC | PRN
  Administered 2015-05-14: 650 mg via ORAL
  Filled 2015-05-14: qty 2

## 2015-05-14 MED ORDER — FINASTERIDE 5 MG PO TABS
5.0000 mg | ORAL_TABLET | Freq: Every day | ORAL | Status: DC
  Administered 2015-05-14: 5 mg via ORAL
  Filled 2015-05-14 (×2): qty 1

## 2015-05-14 MED ORDER — SIMVASTATIN 20 MG PO TABS
20.0000 mg | ORAL_TABLET | Freq: Every day | ORAL | Status: DC
  Administered 2015-05-14 – 2015-05-16 (×3): 20 mg via ORAL
  Filled 2015-05-14 (×4): qty 1

## 2015-05-14 NOTE — ED Notes (Signed)
Patient placed on 2L/[place] he said "I'm still breathing hard".  His oxygen levels are 98%, and his breathing is not labored.

## 2015-05-14 NOTE — ED Notes (Signed)
Lab called and advised me that he would be difficult to cross match due to having antibodies.   The lab advised they would call ne when it was ready.

## 2015-05-14 NOTE — Progress Notes (Signed)
PT Cancellation Note  Patient Details Name: Nicholas [name] MRN: [id number] DOB: 02/16/1934   Cancelled Treatment:    Reason Eval/Treat Not Completed: Patient not medically ready. Discussed pt case with RN who states that pt is getting a blood transfusion, and is later scheduled to get platelets. Asked that therapy hold until tomorrow. Will check back tomorrow to complete PT eval.    Rolinda Roan 05/14/2015, 11:16 AM   Rolinda Roan, PT, DPT Acute Rehabilitation Services Pager: [phone]

## 2015-05-14 NOTE — Progress Notes (Signed)
Initial Nutrition Assessment  DOCUMENTATION CODES:  Severe malnutrition in context of chronic illness  INTERVENTION:  Ensure Enlive (each supplement provides 350kcal and 20 grams of protein) BID  NUTRITION DIAGNOSIS:  Increased nutrient needs related to chronic illness as evidenced by estimated needs  GOAL:  Patient will meet greater than or equal to 90% of their needs  MONITOR:  PO intake, Supplement acceptance, Labs, Weight trends, Skin, I & O's  REASON FOR ASSESSMENT:  Malnutrition Screening Tool  ASSESSMENT: 81 y.o. male with PMH of hypertension, hyperlipidemia, GERD, atrial fibrillation on Coumadin, bi-ventricular pacemaker placement, history of rectal cancer (s/p of proctocolectomy, s/p of colostomy 2008), coronary artery disease (s/p of CABG), systolic congestive heart failure (EF of 40-45%), BPH, chronic kidney disease-stage IV, who presents with shortness breath.  Patient reports a poor appetite.  Typically consumes 2 meals per day.  Wife cooks for him.  Also reports a 70 lb weight loss since his pacemaker was put in (October 2015), however, weight readings below do not reflect this.  Drinks Ensure oral supplements at home; likes [employer].  RD to order during hospitalization.  Nutrition-Focused physical exam completed. Findings are severe fat depletion, severe muscle depletion.  Height:  Ht Readings from Last 1 Encounters:  05/14/15 5\' 10"  (1.778 m)    Weight:  Wt Readings from Last 1 Encounters:  05/14/15 154 lb 8.7 oz (70.1 kg)    Ideal Body Weight:  75.4 kg  Wt Readings from Last 20 Encounters:  05/14/15 154 lb 8.7 oz (70.1 kg)  05/02/2015 144 lb (65.318 kg)  05/12/15 145 lb 3.2 oz (65.862 kg)  04/15/15 134 lb 6.4 oz (60.963 kg)  04/11/15 135 lb (61.236 kg)  03/05/15 142 lb 12.8 oz (64.774 kg)  02/12/15 149 lb 8 oz (67.813 kg)  02/03/15 151 lb (68.493 kg)  12/18/14 152 lb 4.8 oz (69.083 kg)  12/12/14 154 lb (69.854 kg)  11/27/14 155 lb  (70.308 kg)  11/13/14 151 lb 6.4 oz (68.675 kg)  10/29/14 155 lb (70.308 kg)  10/23/14 157 lb (71.215 kg)  10/05/14 163 lb 6.4 oz (74.118 kg)  09/12/14 180 lb (81.647 kg)  06/19/14 186 lb (84.369 kg)  06/17/14 186 lb 1.6 oz (84.414 kg)  05/21/14 188 lb 12.8 oz (85.639 kg)  03/18/14 184 lb (83.462 kg)    BMI:  Body mass index is 22.17 kg/(m^2).  Estimated Nutritional Needs:  Kcal:  1700-1900  Protein:  90-100 gm  Fluid:  1.7-1.9 L  Skin:  Wound (see comment) (Stage I to buttock)  Diet Order:  Diet Heart Room service appropriate?: Yes; Fluid consistency:: Thin  EDUCATION NEEDS:  No education needs identified at this time   Intake/Output Summary (Last 24 hours) at 05/14/15 1540 Last data filed at 05/14/15 1059  Gross per 24 hour  Intake 1018.33 ml  Output    375 ml  Net 643.33 ml    Last BM:  Not documented  Arthur Holms, RD, LDN Pager #: [phone] After-Hours Pager #: [phone]

## 2015-05-14 NOTE — ED Notes (Signed)
Service Response paged for Hospital Bed @ [phone].

## 2015-05-14 NOTE — Progress Notes (Signed)
[facility]                                                                              Patient Demographics  Nicholas [name], is a 81 y.o. male, DOB - 1934-04-07, CBU:[id number]  Admit date - 05/25/2015   Admitting Physician Ivor Costa, MD  Outpatient Primary MD for the patient is Leonard Downing, MD  LOS - 0   Chief Complaint  Patient presents with  . Shortness of Breath       Brief HPI   Patient is a 81 y.o. male with hypertension, hyperlipidemia, GERD, atrial fibrillation on Coumadin, bi-ventricular pacemaker placement, history of rectal cancer (s/p of proctocolectomy, s/p of colostomy 2008), coronary artery disease (s/p of CABG), systolic congestive heart failure (EF of 40-45%), BPH, chronic kidney disease-stage IV, who presented with shortness of breath. Patient reported that he started having worsening shortness of breath on the day of admission, no chest pain, mild dry cough but no fevers or chills. He also reported worsening leg edema. His body weight increased by 5 pounds recently. Of note, in ED, he was found to have pan-cytopenenia with Hbg 12.6 on 04/15/15-->6.7, WBC 8.1-->2.04 and platelet 117-->8. He did not notice any bleeding, no dark or bloody stool. No abdominal pain. He had difficulty in urinating a day before the admission due to BPH but no hematuria and that has resolved. In ED, patient was found to have BNP 1324, INR 1.64, temperature 99.3, no tachycardia, stable renal function. Chest x-ray showed hyperinflation without infiltration. Patient was admitted to inpatient for further evaluation and treatment.   Assessment & Plan    Principal Problem:   SOB (shortness of breath):  - Likely due to symptomatic anemia, pancytopenia, hemoglobin of 6.7. Patient may have mildly exacerbated CHF due to symptomatic anemia - Continue Lasix, workup for pancytopenia  Active Problems: Symptomatic anemia, pancytopenia: Unclear etiology however patient has been  seen by hematology, Dr. Learta Codding in the last 1 year and was thought to have possible myelodysplasia versus renal failure, has received erythropoietin in the past. Patient reports no bone marrow biopsy in the past. - Rule out occult bleeding, check stool occult test, haptoglobin, LDH - Hematology consult called, discussed with Dr Alvy Bimler, who will notify Dr Learta Codding - For now will give 2 units of packed RBC and 2 pheresis of platelets    HYPERCHOLESTEROLEMIA - Continue statin  Mild acute on chronic Systolic CHF:  - 2-D echo on 10/15 showed EF of 40-45% - Continue Lasix at home dose. He received 1 dose of IV Lasix 40 mg 1 in ED - Likely patient's symptoms are due to symptomatic anemia, BNP is elevated at 1324 which can be worsening CHF or CKD.  Atrial fibrillation - Currently rate controlled, CHADS2 VASC 4 - Patient is on Coumadin INR 1.64 on admission, heart rate well controlled. - Due to severe symptomatic anemia and rule out occult bleeding, hold Coumadin    Myasthenia gravis -Followed up by Dr. Krista Blue, neurology, last seen on 05/05/2015 - Continue prednisone, Mestinon and Imuran  Chronic kidney disease stage IV: Baseline creatinine 2.6-3.0 - Follow renal function closely with diuresis  BPH -  Continue Flomax, finasteride    Code Status: Full code   Family Communication: Discussed in detail with the patient, all imaging results, lab results explained to the patient    Disposition Plan: Remain in stepdown until medically stable  Time Spent in minutes 25  minutes  Procedures  None  Consults   hematology  DVT Prophylaxis  SCD's  Medications  Scheduled Meds: . sodium chloride   Intravenous Once  . antiseptic oral rinse  7 mL Mouth Rinse BID  . azaTHIOprine  50 mg Oral BID  . B-complex with vitamin C   Oral Daily  . feeding supplement (ENSURE ENLIVE)  237 mL Oral Q24H  . ferrous sulfate  325 mg Oral BID WC  . finasteride  5 mg Oral Daily  . furosemide  40 mg Oral Daily  .  hydrALAZINE  25 mg Oral BID  . isosorbide dinitrate  10 mg Oral BID WC  . metoprolol succinate  50 mg Oral BID  . predniSONE  10 mg Oral Q breakfast  . pyridostigmine  30 mg Oral 3 times per day  . simvastatin  20 mg Oral QHS  . sodium chloride  3 mL Intravenous Q12H  . tamsulosin  0.4 mg Oral QPC supper  . vitamin C  500 mg Oral Daily   Continuous Infusions:  PRN Meds:.sodium chloride, nitroGLYCERIN, sodium chloride   Antibiotics   Anti-infectives    None        Subjective:   Nicholas [name] was seen and examined today.  shortness of breath stable.  Patient denies dizziness, chest pain, abdominal pain, N/V/D/C, new weakness, numbess, tingling. No acute events overnight.    Objective:   Blood pressure 151/95, pulse 74, temperature 99.3 F (37.4 C), temperature source Oral, resp. rate 26, height $RemoveBe'5\' 10"'[id number]$  (1.778 m), weight 70.1 kg (154 lb 8.7 oz), SpO2 99 %.  Wt Readings from Last 3 Encounters:  05/14/15 70.1 kg (154 lb 8.7 oz)  05/04/2015 65.318 kg (144 lb)  05/12/15 65.862 kg (145 lb 3.2 oz)     Intake/Output Summary (Last 24 hours) at 05/14/15 1141 Last data filed at 05/14/15 1059  Gross per 24 hour  Intake 1018.33 ml  Output    375 ml  Net 643.33 ml    Exam  General: Alert and oriented x 3, NAD  HEENT:  PERRLA, EOMI, Anicteic Sclera, mucous membranes moist.   Neck: Supple, no JVD, no masses  CVS: Irregularly irregular rhythm, no MRG  Respiratory: Clear to auscultation bilaterally, no wheezing, rales or rhonchi  Abdomen: Soft, nontender, nondistended, + bowel sounds, colostomy+  Ext: no cyanosis clubbing, 1+ edema  Neuro: AAOx3, Cr N's II- XII. Strength 5/5 upper and lower extremities bilaterally  Skin: No rashes  Psych: Normal affect and demeanor, alert and oriented x3    Data Review   Micro Results Recent Results (from the past 240 hour(s))  MRSA PCR Screening     Status: None   Collection Time: 05/14/15  6:48 AM  Result Value Ref Range Status     MRSA by PCR NEGATIVE NEGATIVE Final    Comment:        The GeneXpert MRSA Assay (FDA approved for NASAL specimens only), is one component of a comprehensive MRSA colonization surveillance program. It is not intended to diagnose MRSA infection nor to guide or monitor treatment for MRSA infections.     Radiology Reports Dg Chest 2 View  05/10/2015   CLINICAL DATA:  Shortness of breath for 1 day.  EXAM: CHEST  2 VIEW  COMPARISON:  Chest CT 04/16/2015  FINDINGS: Patient is post median sternotomy and CABG. Dual lead left-sided pacemaker remains in place. Mild cardiomegaly and tortuosity of the thoracic aorta. No pulmonary edema. The lungs are hyperinflated. Minimal blunting of the right costophrenic angle. No confluent airspace disease. No pneumothorax. Left proximal humerus prosthesis in place. No acute osseous abnormalities are seen.  IMPRESSION: 1.  No acute pulmonary process. 2. Hyperinflation. Minimal blunting of right costophrenic angle, may be related to hyperinflation versus small pleural effusion.   Electronically Signed   By: Jeb Levering M.D.   On: 05/09/2015 22:48   Ct Chest Wo Contrast  04/16/2015   CLINICAL DATA:  Shortness of breath with exertion.  EXAM: CT CHEST WITHOUT CONTRAST  TECHNIQUE: Multidetector CT imaging of the chest was performed following the standard protocol without IV contrast.  COMPARISON:  CT scan of October 16, 2012.  FINDINGS: No pneumothorax or pleural effusion is noted. No acute pulmonary disease is noted. Status post coronary artery bypass graft. No evidence of thoracic aortic aneurysm is noted. Atherosclerotic calcifications of thoracic aorta are noted. No mediastinal mass or adenopathy is noted. No significant abnormality seen in the visualized portion of the upper abdomen. Status post left shoulder arthroplasty. No other significant abnormality seen within the visualized skeleton.  IMPRESSION: No acute cardiopulmonary abnormality seen.   Electronically  Signed   By: Marijo Conception, M.D.   On: 04/16/2015 14:24    CBC  Recent Labs Lab 05/23/2015 2348 05/14/15 0119  WBC 0.1* 0.2*  HGB 6.7* 6.9*  HCT 20.8* 21.6*  PLT 8* 8*  MCV 102.0* 101.9*  MCH 32.8 32.5  MCHC 32.2 31.9  RDW 14.1 14.3  LYMPHSABS 0.1*  --   MONOABS 0.0*  --   EOSABS 0.0  --   BASOSABS 0.0  --     Chemistries   Recent Labs Lab 05/23/2015 2222  NA 142  K 3.6  CL 100*  CO2 29  GLUCOSE 97  BUN 40*  CREATININE 2.82*  CALCIUM 9.9   ------------------------------------------------------------------------------------------------------------------ estimated creatinine clearance is 21.4 mL/min (by C-G formula based on Cr of 2.82). ------------------------------------------------------------------------------------------------------------------ No results for input(s): HGBA1C in the last 72 hours. ------------------------------------------------------------------------------------------------------------------ No results for input(s): CHOL, HDL, LDLCALC, TRIG, CHOLHDL, LDLDIRECT in the last 72 hours. ------------------------------------------------------------------------------------------------------------------ No results for input(s): TSH, T4TOTAL, T3FREE, THYROIDAB in the last 72 hours.  Invalid input(s): FREET3 ------------------------------------------------------------------------------------------------------------------ No results for input(s): VITAMINB12, FOLATE, FERRITIN, TIBC, IRON, RETICCTPCT in the last 72 hours.  Coagulation profile  Recent Labs Lab 05/08/15 1226 05/04/2015 2348  INR 1.3 1.64*    No results for input(s): DDIMER in the last 72 hours.  Cardiac Enzymes  Recent Labs Lab 05/14/15 0843  TROPONINI 0.40*   ------------------------------------------------------------------------------------------------------------------ Invalid input(s): POCBNP   Recent Labs  05/14/15 0523  GLUCAP 4     Carlton Sweaney M.D. [facility] 05/14/2015, 11:41 AM  Pager: [phone]   Between 7am to 7pm - call Pager - [phone]  After 7pm go to [email or web address] - password [email or web address]  Call night coverage person covering after 7pm

## 2015-05-14 NOTE — Progress Notes (Signed)
Echocardiogram 2D Echocardiogram has been performed.  Nicholas [name] 05/14/2015, 1:10 PM

## 2015-05-14 NOTE — ED Notes (Signed)
Family at bedside. 

## 2015-05-14 NOTE — ED Notes (Signed)
CRITICAL VALUE ALERT  Critical value received: WBC 0.1  Date of notification:  05/04/2015  Time of notification: 0051  Critical value read back:Yes.    Nurse who received alert:  MG Chue Berkovich,RN  MD notified (1st page):  Dr. Porfirio Mylar  Time of first page:  2351  MD notified (2nd page):  Time of second page:  Responding MD:  Dr. Porfirio Mylar  Time MD responded:  [phone]

## 2015-05-14 NOTE — Care Management Note (Signed)
Case Management Note  Patient Details  Name: MYRICK MCNAIRY MRN: [id number] Date of Birth: 01-Jun-1934  Subjective/Objective:                  Patient lives at home with wife.  Independent - states just weak.  Has RW, cane and shower chair at home that he does not use at this time.  Have had HH at home prior.   Action/Plan: CM will continue to follow for discharge planning  Expected Discharge Date:                  Expected Discharge Plan:  [place]  In-House Referral:     Discharge planning Services     Post Acute Care Choice:    Choice offered to:     DME Arranged:    DME Agency:     HH Arranged:    HH Agency:     Status of Service:  In process, will continue to follow  Medicare Important Message Given:    Date Medicare IM Given:    Medicare IM give by:    Date Additional Medicare IM Given:    Additional Medicare Important Message give by:     If discussed at [place] of Stay Meetings, dates discussed:    Additional Comments:  Vergie Living, RN 05/14/2015, 5:18 PM

## 2015-05-14 NOTE — ED Notes (Signed)
CRITICAL VALUE ALERT  Critical value received:  Platelet 8000  Date of notification:  05/19/2015  Time of notification:  2351  Critical value read back:Yes.    Nurse who received alert:  MG Mayank Teuscher,RN  MD notified (1st page):  Dr. Porfirio Mylar  Time of first page:  2351  MD notified (2nd page):  Time of second page:  Responding MD:  Dr. Porfirio Mylar  Time MD responded:  [phone]

## 2015-05-14 NOTE — Consult Note (Signed)
[facility]  [phone]   Requesting Provider: [facility]  Consulting Provider: Kathrin Ruddy  Primary Oncologist: Leighton Roach. Sherrill,MD  HOSPITAL CONSULTATION  NOTE  Reason for Consultation: Pancytopenia I have seen the patient, examined him and edited the notes as follows  HPI: 80 year old man with a history of rectal cancer on observation, as well as a history of anemia and thrombocytopenia, last seen in April of 2016 at the [facility], who  presented to the emergency department on 5/17 with shortness of breath and dry cough. He had a temp of 99.3. He denied any chest pain,or chills. He also noted to have increased bilateral lower extremity edema, with a 5 lbs weight gain over the last 2 weeks.  He had an abnormal CBC with a Hb of 6.7 (was 12.6 on 4/19, with last Procrit injection in September 2015) His WBC was 0.4 (from 8.1) and platelets of 8,000(from 117,000). CMET was unremarkable. BNP 1324, INR 1.64 Chest x ray was negative for infiltrates.  He denied any hematochezia, epistaxis, hematemesis, or hematuria. He has easy bruising. He denies any nausea, vomiting, diarrhea or abdominal pain. He does have some associated symptoms of myasthenia gravis including voice changes, slower chewing, mild dysphagia, generalized weakness. He has been more fatigues, with an unintentional 70 lb weight loss since October 2015, requiring an adjustment in his meds, with increase in prednisone, Imuran and Mestinon, with improved symptoms.  He did have some urinary hesitancy due to BPH, but this resolved before presentation. He denies any neurological deficits such as vertigo, numbness, tingling or motor deficiencies. No confusion is reported. Hemoccult is pending. Other pending tests include Haptoglobin, LDH to rule out hemolysis. Peripheral blood smear from blood drawn prior to transfusion has been ordered for review. Patient is receiving 2 units of blood  and 2 unit of  pheresed platelets to improve counts. We have been kindly notified of the patient's admission.   Brief Oncological History: 1. Rectal cancer, stage II, T3 N0, status post neoadjuvant infusional 5-fluorouracil and radiation, status post APR 06/01/2007. He completed 4 months of adjuvant Xeloda.  Colonoscopy June 2012-negative, repeat planned for 5 years 2. Anemia. Likely related to renal failure versus myelodysplasia. Improved with erythropoietin. Last treated with erythropoietin in September 2015 3. Admission with pyelonephritis 11/20/2013. 4. Chronic renal failure. 5. Mild thrombocytopenia- questionable Myelodysplasia versus ITP. Last platelet count at office was 117k 6. Anorexia/weight loss. Question etiology. 7. Dysphagia. Status post an upper endoscopy 11/27/2014 with dilation of a gastroesophageal junction stricture 8. Myasthenia Gravis on prednisone therapy; start April 2016 was placed on Imuran  Past Medical History  Diagnosis Date  . 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 Pure hypercholesterolemia     takes Vytorin daily  . Diverticulosis of colon (without mention of hemorrhage)   . Hypertension     takes Amlodipine,Metoprolol,and Cardura daily  . Coronary artery disease     a. s/p CABG  . CHF (congestive heart failure)     a. s/p STJ CRTD  . H/O blood clots 1993  . Arthritis   . Skin cancer     nose  . GERD (gastroesophageal reflux disease)        . Enlarged prostate        . Insomnia   . CKD (chronic kidney disease) stage 4, GFR 15-29 ml/min   . Permanent atrial fibrillation   . PVC (premature ventricular contraction)   . Ischemic cardiomyopathy   .  HLD (hyperlipidemia)   . Blood transfusion without reported diagnosis      MEDICATIONS:  Scheduled Meds: . sodium chloride   Intravenous Once  . antiseptic oral rinse  7 mL Mouth Rinse BID  . azaTHIOprine  50 mg Oral BID  . B-complex with vitamin C   Oral Daily  . feeding supplement (ENSURE ENLIVE)  237 mL Oral  Q24H  . ferrous sulfate  325 mg Oral BID WC  . finasteride  5 mg Oral Daily  . furosemide  40 mg Oral Daily  . hydrALAZINE  25 mg Oral BID  . isosorbide dinitrate  10 mg Oral BID WC  . metoprolol succinate  50 mg Oral BID  . predniSONE  10 mg Oral Q breakfast  . pyridostigmine  30 mg Oral 3 times per day  . simvastatin  20 mg Oral QHS  . sodium chloride  3 mL Intravenous Q12H  . tamsulosin  0.4 mg Oral QPC supper  . vitamin C  500 mg Oral Daily   Continuous Infusions:  PRN Meds:.sodium chloride, nitroGLYCERIN, sodium chloride  ALLERGIES:  Allergies  Allergen Reactions  . Cephalexin Other (See Comments)    "too strong for my system" - per office note 05/05/2012  . Ciprofloxacin Other (See Comments)    Reaction unknown  . Hctz [Hydrochlorothiazide]     DIZZINESS   . Levofloxacin Other (See Comments)    "too strong for my system" - per office note 05/05/2012  . Lipitor [Atorvastatin Calcium]     MUSCLE PAIN  . Niaspan [Niacin Er]     FLUSHING   . Oxycodone-Acetaminophen Other (See Comments)    Reaction unknown  . Sulfonamide Derivatives Other (See Comments)    Reaction unkonwn    Family History  Problem Relation Age of Onset  . Pancreatic cancer Father   . Diabetes Son   . Emphysema Mother   . Anesthesia problems Neg Hx   . Hypotension Neg Hx   . Malignant hyperthermia Neg Hx   . Pseudochol deficiency Neg Hx      Past Surgical History  Procedure Laterality Date  . Shoulder surgery  2007    left  . Cataract extraction      right  . Abdominoperineal proctocolectomy  06/01/2007  . Coronary angioplasty  05/22/92  . Colostomy  2008  . Colonoscopy    . Coronary artery bypass graft  1993  . Colon surgery  2008    colectomy  . Total knee arthroplasty  02/28/2012    Procedure: TOTAL KNEE ARTHROPLASTY;  Surgeon: Rudean Haskell, MD;  Location: [place];  Service: Orthopedics;  Laterality: Right;  . Bi-ventricular pacemaker insertion N/A 10/01/2014    SJM Allure Quadra  implnated by Dr Rayann Heman for symptomatic bradycadria    History   Social History  . Marital Status: Married    Spouse Name: N/A  . Number of Children: N/A  . Years of Education: N/A   Occupational History  . retired     Warehouse manager   Social History Main Topics  . Smoking status: Former Smoker -- 10 years    Types: Pipe, Cigars    Quit date: 05/26/1991  . Smokeless tobacco: Current User    Types: Chew  . Alcohol Use: No  . Drug Use: No  . Sexual Activity: Not on file   Other Topics Concern  . Not on file   Social History Narrative   The patient lives with his wife.     PHYSICAL EXAMINATION:  Filed Vitals:   05/14/15 1145  BP:   Pulse:   Temp: 98.9 F (37.2 C)  Resp:       Intake/Output Summary (Last 24 hours) at 05/14/15 1200 Last data filed at 05/14/15 1059  Gross per 24 hour  Intake 1018.33 ml  Output    375 ml  Net 643.33 ml     GENERAL:alert, no distress and comfortable, chronically ill appearing SKIN: skin color, texture, turgor are normal, no rashes or significant lesions. Several areas of ecchymoses on his arms EYES: normal, conjunctiva are pale and non-injected, sclera clear OROPHARYNX:no exudate, no erythema and lips, buccal mucosa, and tongue normal  NECK: supple, thyroid normal size, non-tender, without nodularity LYMPH:  no palpable lymphadenopathy in the cervical, axillary or inguinal LUNGS: clear to auscultation and percussion with normal breathing effort HEART: irregular rate & rhythm and no murmurs and 1+bilateral lower extremity edema. Well healed sternotomy scar ABDOMEN:  soft, non-tender and normal bowel sounds. Left lower quadrant colostomy. Musculoskeletal:no cyanosis of digits and no clubbing  PSYCH: alert & oriented x 3 with fluent speech NEURO:  Has a history of myasthenia gravis, with generalized weakness, but able to follow commands  LABORATORY/RADIOLOGY DATA:   Recent Labs Lab 05/05/2015 2348 05/14/15 0119  WBC 0.1* 0.2*  HGB  6.7* 6.9*  HCT 20.8* 21.6*  PLT 8* 8*  MCV 102.0* 101.9*  MCH 32.8 32.5  MCHC 32.2 31.9  RDW 14.1 14.3  LYMPHSABS 0.1*  --   MONOABS 0.0*  --   EOSABS 0.0  --   BASOSABS 0.0  --     CMP    Recent Labs Lab 05/25/2015 2222  NA 142  K 3.6  CL 100*  CO2 29  GLUCOSE 97  BUN 40*  CREATININE 2.82*  CALCIUM 9.9        Component Value Date/Time   BILITOT 1.35* 04/15/2015 0831   BILITOT 0.8 03/05/2015 1008   BILIDIR 0.2 03/05/2015 1008   IBILI 0.6 11/20/2013 1355        Component Value Date/Time   ESRSEDRATE 2 04/11/2015 1103     Recent Labs Lab 05/08/15 1226 05/11/2015 2348  INR 1.3 1.64*      Urinalysis    Component Value Date/Time   COLORURINE AMBER* 05/14/2015 0425   APPEARANCEUR CLOUDY* 05/14/2015 0425   LABSPEC 1.011 05/14/2015 0425   PHURINE 5.0 05/14/2015 0425   GLUCOSEU NEGATIVE 05/14/2015 0425   HGBUR SMALL* 05/14/2015 0425   BILIRUBINUR NEGATIVE 05/14/2015 0425   KETONESUR NEGATIVE 05/14/2015 0425   PROTEINUR 30* 05/14/2015 0425   UROBILINOGEN 1.0 05/14/2015 0425   NITRITE NEGATIVE 05/14/2015 0425   LEUKOCYTESUR NEGATIVE 05/14/2015 0425    Recent Labs Lab 05/14/15 0523  GLUCAP 76    Cardiac Enzymes:  Recent Labs Lab 05/14/15 0843  TROPONINI 0.40*      Radiology Studies:  Dg Chest 2 View  05/14/2015   CLINICAL DATA:  Shortness of breath for 1 day.  EXAM: CHEST  2 VIEW  COMPARISON:  Chest CT 04/16/2015  FINDINGS: Patient is post median sternotomy and CABG. Dual lead left-sided pacemaker remains in place. Mild cardiomegaly and tortuosity of the thoracic aorta. No pulmonary edema. The lungs are hyperinflated. Minimal blunting of the right costophrenic angle. No confluent airspace disease. No pneumothorax. Left proximal humerus prosthesis in place. No acute osseous abnormalities are seen.  IMPRESSION: 1.  No acute pulmonary process. 2. Hyperinflation. Minimal blunting of right costophrenic angle, may be related to hyperinflation versus  small pleural  effusion.   Electronically Signed   By: Jeb Levering M.D.   On: 04/30/2015 22:48   Last echo 09-2014 demonstrated EF 40-45%, severe hypokinesis of basal-midinferolateral, inferior, and inferoseptal myocardium; mild to moderate MR; LA 50  I reviewed his peripheral smear. Absolute leukopenia and thrombocytopenia is seen. No platelet clumping or schistocytes. Mild macrocytes seen. 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  MDS cannot be excluded in the background, though unlikely can have acute exacerbation like this  A. 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 Last treated with Procrit in September 2015 His Hb on admission was 6.7, requiring 2 units of blood Hemoccult, Haptoglobin and LDH are pending to rule out GI bleed or hemolytic process Peripheral blood smear has been ordered, does not look like hemolysis WIll check vitamin B12, serum erythropoeitin level and iron studies MCV is elevated at 102 could be due to ineffective erythropoiesis from recent Imuran therapy  Monitor counts closely Transfuse blood to maintain a Hb of 8 g especially in view of major cardiac history but will defer transfusion decision to the primary service Will STOP imuran for now  B.Thrombocytopenia Patient has a history of thrombocytopenia, currently at 8,000 He received 2 unit of pheresed platelets Monitor counts closely Check B12 from blood drawn prior to transfusion Transfuse 1 unit of platelets if count is less or equal than 10,000 or 20,000 if the patient is acutely bleeding Hold anticoagulants for now until platelets are greater than 50k and no bleeding issues are noted Will hold  Imuran  Leukopenia He presented with cough, low grade fever; since admission this has resolved His count is low; clinically he does not appear septic Cultures pending Continue to closely monitor If he has recurrent fever and behaved septic, I would recommend GCSF Most likely cause is significant bone marrow suppression from Imuran. I do not believe ordering a bone marrow biopsy now would be helpful I would like to see if the bone marrow would recover with stopping Imuran  Malnutrition Consider Nutrition evaluation  DVT prophylaxis On mechanical devices due to low platelet count and risk of bleeding  Myasthenia Gravis Holding Imuran, on prednisone Recommend neurology consult  Full Code  Dr. Learta Codding will return tomorrow for further evaluation Other medical issues as per admitting team  [facility] E, PA-C 05/14/2015, 12:00 PM  [place], [place], MD 05/14/2015

## 2015-05-14 NOTE — Care Management Utilization Note (Addendum)
Patient does not meet Inpatient level of care.  Discussed with Dr.Rai and Dr. Reynaldo Minium, Physician Advisor, Utilization Review Committee, who are in agreement.  Patient notified via delivery of "Medicare Outpatient Information Sheet"    Copy placed in shadow chart.   Vergie Living [phone]

## 2015-05-14 NOTE — H&P (Signed)
[facility] History and Physical  Nicholas [name] SWN:[id number] DOB: 1933-12-30 DOA: 05/24/2015  Referring physician: ED physician PCP: Leonard Downing, MD  Specialists:   Chief Complaint: sob  HPI: Nicholas [name] is a 81 y.o. male with PMH of hypertension, hyperlipidemia, GERD, atrial fibrillation on Coumadin, bi-ventricular pacemaker placement, history of rectal cancer (s/p of proctocolectomy, s/p of colostomy 2008), coronary artery disease (s/p of CABG), systolic congestive heart failure (EF of 40-45%), BPH, chronic kidney disease-stage IV, who presents with shortness breath.  Patient reports that he started having worsening shortness of breath today. He does not have chest pain. He has mild dry cough. No fever or chills. He also reports that he has worsening leg edema. His body weight increased by 5 pounds recently. Of note, in ED, he was found to have pan-cytopenenia with Hbg 12.6 on 04/15/15-->6.7, WBC 8.1-->2.04 and platelet 117-->8. He did not notice any bleeding, no dark or bloody stool. No abdominal pain. He had difficulty in urinating yesterday due to BPH, but no hematuria. Today he is okey with urination.  Currently patient denies fever, chills, running nose, ear pain, headaches, abdominal pain, diarrhea, constipation, dysuria, urgency, frequency, hematuria, skin rashes. No unilateral weakness, numbness or tingling sensations. No vision change or hearing loss.  In ED, patient was found to have BNP 1324, INR 1.64, temperature 99.3, no tachycardia, stable renal function. Chest x-ray showed hyperinflation without infiltration. Patient is admitted to inpatient for further evaluation and treatment.  Where does patient live?   At home    Can patient participate in ADLs?  Little     Review of Systems:   General: no fevers, chills, no changes in body weight, has poor appetite, has fatigue HEENT: no blurry vision, hearing changes or sore throat Pulm: has dyspnea and coughing, No  wheezing CV: no chest pain, palpitations Abd: no nausea, vomiting, abdominal pain, diarrhea, constipation GU: no dysuria, burning on urination, increased urinary frequency, hematuria  Ext: has leg edema Neuro: no unilateral weakness, numbness, or tingling, no vision change or hearing loss Skin: no rash MSK: No muscle spasm, no deformity, no limitation of range of movement in spin Heme: No easy bruising.  Travel history: No recent long distant travel.  Allergy:  Allergies  Allergen Reactions  . Cephalexin Other (See Comments)    "too strong for my system" - per office note 05/05/2012  . Ciprofloxacin Other (See Comments)    Reaction unknown  . Hctz [Hydrochlorothiazide]     DIZZINESS   . Levofloxacin Other (See Comments)    "too strong for my system" - per office note 05/05/2012  . Lipitor [Atorvastatin Calcium]     MUSCLE PAIN  . Niaspan [Niacin Er]     FLUSHING   . Oxycodone-Acetaminophen Other (See Comments)    Reaction unknown  . Sulfonamide Derivatives Other (See Comments)    Reaction unkonwn    Past Medical History  Diagnosis Date  . 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 Pure hypercholesterolemia     takes Vytorin daily  . Diverticulosis of colon (without mention of hemorrhage)   . Hypertension     takes Amlodipine,Metoprolol,and Cardura daily  . Coronary artery disease     a. s/p CABG  . CHF (congestive heart failure)     a. s/p STJ CRTD  . H/O blood clots 1993  . Arthritis   . Skin cancer     nose  . GERD (gastroesophageal reflux disease)        .  Enlarged prostate        . Insomnia   . CKD (chronic kidney disease) stage 4, GFR 15-29 ml/min   . Permanent atrial fibrillation   . PVC (premature ventricular contraction)   . Ischemic cardiomyopathy   . HLD (hyperlipidemia)     Past Surgical History  Procedure Laterality Date  . Shoulder surgery  2007    left  . Cataract extraction      right  . Abdominoperineal proctocolectomy  06/01/2007  .  Coronary angioplasty  05/22/92  . Colostomy  2008  . Colonoscopy    . Coronary artery bypass graft  1993  . Colon surgery  2008    colectomy  . Total knee arthroplasty  02/28/2012    Procedure: TOTAL KNEE ARTHROPLASTY;  Surgeon: Rudean Haskell, MD;  Location: [facility];  Service: Orthopedics;  Laterality: Right;  . Bi-ventricular pacemaker insertion N/A 10/01/2014    SJM Allure Quadra implnated by Dr Rayann Heman for symptomatic bradycadria    Social History:  reports that he quit smoking about 23 years ago. His smoking use included Pipe and Cigars. His smokeless tobacco use includes Chew. He reports that he does not drink alcohol or use illicit drugs.  Family History:  Family History  Problem Relation Age of Onset  . Pancreatic cancer Father   . Diabetes Son   . Emphysema Mother   . Anesthesia problems Neg Hx   . Hypotension Neg Hx   . Malignant hyperthermia Neg Hx   . Pseudochol deficiency Neg Hx      Prior to Admission medications   Medication Sig Start Date End Date Taking? Authorizing Provider  azaTHIOprine (IMURAN) 50 MG tablet One tab po bid xone week, then 2 tabs po bid 04/11/15  Yes Marcial Pacas, MD  feeding supplement, ENSURE COMPLETE, (ENSURE COMPLETE) LIQD Take 237 mLs by mouth daily. 10/05/14  Yes Barton Dubois, MD  ferrous sulfate 325 (65 FE) MG tablet Take 325 mg by mouth 2 (two) times daily with a meal.    Yes Ladell Pier, MD  finasteride (PROSCAR) 5 MG tablet Take 5 mg by mouth daily.     Yes Historical Provider, MD  furosemide (LASIX) 40 MG tablet Take 1 tablet (40 mg total) by mouth daily. 10/05/14  Yes Barton Dubois, MD  hydrALAZINE (APRESOLINE) 25 MG tablet Take 1 tablet (25 mg total) by mouth 2 (two) times daily. 12/12/14  Yes Darlin Coco, MD  isosorbide dinitrate (ISORDIL) 20 MG tablet Take 10 mg by mouth 2 (two) times daily. 01/09/15  Yes Historical Provider, MD  metoprolol succinate (TOPROL-XL) 50 MG 24 hr tablet Take 1 tablet (50 mg total) by mouth 2 (two) times  daily. 05/12/15  Yes Amber Vertis Kelch, NP  nitroGLYCERIN (NITROSTAT) 0.4 MG SL tablet Place 1 tablet (0.4 mg total) under the tongue every 5 (five) minutes as needed. For chest pain 02/13/13  Yes Darlin Coco, MD  predniSONE (DELTASONE) 10 MG tablet 2 tabs po qam x 2 weeks, then one tab po qam 04/11/15  Yes Marcial Pacas, MD  pyridostigmine (MESTINON) 60 MG tablet 1/2 to one tab po tid 04/11/15  Yes Marcial Pacas, MD  simvastatin (ZOCOR) 20 MG tablet Take 1 tablet (20 mg total) by mouth at bedtime. 07/11/14  Yes Darlin Coco, MD  tamsulosin (FLOMAX) 0.4 MG CAPS capsule Take 1 capsule (0.4 mg total) by mouth daily after supper. 10/05/14  Yes Barton Dubois, MD  vitamin C (ASCORBIC ACID) 500 MG tablet Take 500 mg by  mouth daily.     Yes Historical Provider, MD  Vitamins-Lipotropics (B-100 COMPLEX) TABS Take 1 tablet by mouth daily.    Yes Historical Provider, MD  warfarin (COUMADIN) 3 MG tablet TAKE AS DIRECTED PER COUMADIN CLINIC Patient taking differently: Take 1 1/2 tablet everyday except Thursday and Saturday takes one tablet 03/10/15  Yes Darlin Coco, MD    Physical Exam: Filed Vitals:   05/14/15 0100 05/14/15 0130 05/14/15 0135 05/14/15 0200  BP: 125/54 139/53 139/53 138/67  Pulse:  79 78 43  Temp:      TempSrc:      Resp: 20 26 26 27   SpO2:  99% 99% 100%   General: Not in acute distress. Pale looking HEENT:       Eyes: PERRL, EOMI, no scleral icterus.       ENT: No discharge from the ears and nose, no pharynx injection, no tonsillar enlargement.        Neck: No JVD, no bruit, no mass felt. Heme: No neck lymph node enlargement. Cardiac: S1/S2, irregularly irregular rhythm, No murmurs, No gallops or rubs. Pulm: No rales, wheezing, rhonchi or rubs.  Abd: Soft, nondistended, nontender, no rebound pain, no organomegaly, BS present. Colostomy is clean in surroundings. Ext: has 1+ pitting leg edema bilaterally. 2+DP/PT pulse bilaterally. Musculoskeletal: No joint deformities, No joint  redness or warmth, no limitation of ROM in spin. Skin: No rashes.  Neuro: Alert, oriented X3, cranial nerves II-XII grossly intact, muscle strength 5/5 in all extremities, sensation to light touch intact.  Psych: Patient is not psychotic, no suicidal or hemocidal ideation.  Labs on Admission:  Basic Metabolic Panel:  Recent Labs Lab 05/03/2015 2222  NA 142  K 3.6  CL 100*  CO2 29  GLUCOSE 97  BUN 40*  CREATININE 2.82*  CALCIUM 9.9   Liver Function Tests: No results for input(s): AST, ALT, ALKPHOS, BILITOT, PROT, ALBUMIN in the last 168 hours. No results for input(s): LIPASE, AMYLASE in the last 168 hours. No results for input(s): AMMONIA in the last 168 hours. CBC:  Recent Labs Lab 05/27/2015 2348 05/14/15 0119  WBC 0.1* 0.2*  NEUTROABS 0.0*  --   HGB 6.7* 6.9*  HCT 20.8* 21.6*  MCV 102.0* 101.9*  PLT 8* 8*   Cardiac Enzymes: No results for input(s): CKTOTAL, CKMB, CKMBINDEX, TROPONINI in the last 168 hours.  BNP (last 3 results)  Recent Labs  05/12/2015 2222  BNP 1324.5*    ProBNP (last 3 results)  Recent Labs  09/27/14 1155  PROBNP 7803.0*    CBG: No results for input(s): GLUCAP in the last 168 hours.  Radiological Exams on Admission: Dg Chest 2 View  05/05/2015   CLINICAL DATA:  Shortness of breath for 1 day.  EXAM: CHEST  2 VIEW  COMPARISON:  Chest CT 04/16/2015  FINDINGS: Patient is post median sternotomy and CABG. Dual lead left-sided pacemaker remains in place. Mild cardiomegaly and tortuosity of the thoracic aorta. No pulmonary edema. The lungs are hyperinflated. Minimal blunting of the right costophrenic angle. No confluent airspace disease. No pneumothorax. Left proximal humerus prosthesis in place. No acute osseous abnormalities are seen.  IMPRESSION: 1.  No acute pulmonary process. 2. Hyperinflation. Minimal blunting of right costophrenic angle, may be related to hyperinflation versus small pleural effusion.   Electronically Signed   By: Jeb Levering M.D.   On: 05/09/2015 22:48    EKG: Independently reviewed.  Abnormal findings:     A. fib, LAD, PVC, QTC 472  Assessment/Plan Principal Problem:   SOB (shortness of breath) Active Problems:   HYPERCHOLESTEROLEMIA   Essential hypertension   GERD   Personal history of malignant neoplasm of rectum, rectosigmoid junction, and anus   Hx of CABG   BPH (benign prostatic hyperplasia)   Coronary artery disease   Atrial fibrillation with slow ventricular response   CKD (chronic kidney disease) stage 4, GFR 25-05 ml/min   Systolic CHF, chronic   Pancytopenia   Symptomatic anemia   Myasthenia gravis  SOB: It is most likely due to symptomatic anemia. She has acute pancytopenia with hemoglobin 6.7. He may also have mildly exacerbated CHF and elevated BNP and increased body weight. Other differential diagnosis include, but are less likely pulmonary embolism (no any chest pain, patient is on Coumadin with INR 1.64) and pneumonia (no infiltration on chest x-ray, no fever, no chest pain). -will admit to SDU -treat anemia and do workup for acute pancytopenia as below -Treat CHF as below  Symptomatic pancytopenia: Etiology is not clear. Hbg 12.6 on 04/15/15-->6.7, WBC 8.1-->2.04 and platelet 117-->8. No obvious bleeding source was identified, therefore can not rule out internal bleeding. Another differential diagnosis is hemolysis. -will check FOBT, LDH and haptoglobin -Every 6 hours CBC -Transfuse 2 units of blood now -will consider CT-abdomen if FOBT, LDH and haptoglobin come back negative -hold coumadin  Systolic congestive heart failure: 2-D echo on 09/28/14 showed EF of 40-45%. Patient is on Lasix 40 mg daily at home. Patient reports that his body weight increased by 5 pounds recently. His BNP is elevated at 1324, which can be due to worsening CHF or CKD. Before CBC result came back, ED physician discussed with card, who suggested to esclate the diuretics. Now since patient had severe  anemia, I think his SOB is most likely due to anemia and may be partially due to CHF.  He received one dose of IV lasix 40 mg x 1 in ED. Will not continue IV lasix. -continue home dose of lasix 40 mg daily orally. -Metoprolol  Atrial Fibrillation: CHA2DS2-VASc Score is 4, needs oral anticoagulation. Patient is on Coumadin at home. INR is  1.64 on admission. Heart rate is well controlled. Due to pancytopenia with unclear etiology, need to hold anticoagulants. -hold coumadin -continue metoprolol  Myasthenia gravis: followed up by Dr. Krista Blue. Last seen was on 04/29/2015. -will continue prednisone, Mestinon, Imuran  -follow up with Dr. Krista Blue   CKD (chronic kidney disease) stage 4: Baseline creatinine 2.6-3.0. His creatinine is 2.82, which is at baseline. - follow-up renal function. BMP  HYPERCHOLESTEROLEMIA: LDL was 48 on 03/05/15. -Continue simvastatin  BPH (benign prostatic hyperplasia): -continue Flomax, finasteride  DVT ppx:  scd Code Status: Full code Family Communication:    Yes, patient's  wife     at bed side Disposition Plan: Admit to inpatient   Date of Service 05/14/2015    Ivor Costa [facility] Pager [phone]  If 7PM-7AM, please contact night-coverage [email or web address] Password [email or web address] 05/14/2015, 2:25 AM

## 2015-05-14 NOTE — ED Notes (Signed)
CRITICAL VALUE ALERT  Critical value received:  Hbg 6.7  Date of notification:  05/19/2015  Time of notification:  2351  Critical value read back:Yes.    Nurse who received alert:  MG Braulio Conte, RN  MD notified (1st page):  Dr. Porfirio Mylar  Time of first page:  2351  MD notified (2nd page):  Time of second page:  Responding MD:  Dr. Porfirio Mylar  Time MD responded:  [phone]

## 2015-05-15 ENCOUNTER — Inpatient Hospital Stay (HOSPITAL_COMMUNITY): Payer: Medicare Other

## 2015-05-15 ENCOUNTER — Observation Stay (HOSPITAL_COMMUNITY): Payer: Medicare Other

## 2015-05-15 ENCOUNTER — Encounter (HOSPITAL_COMMUNITY): Payer: Self-pay | Admitting: Pulmonary Disease

## 2015-05-15 DIAGNOSIS — D631 Anemia in chronic kidney disease: Secondary | ICD-10-CM | POA: Diagnosis present

## 2015-05-15 DIAGNOSIS — N401 Enlarged prostate with lower urinary tract symptoms: Secondary | ICD-10-CM | POA: Diagnosis present

## 2015-05-15 DIAGNOSIS — R7989 Other specified abnormal findings of blood chemistry: Secondary | ICD-10-CM | POA: Diagnosis not present

## 2015-05-15 DIAGNOSIS — J9601 Acute respiratory failure with hypoxia: Secondary | ICD-10-CM

## 2015-05-15 DIAGNOSIS — N179 Acute kidney failure, unspecified: Secondary | ICD-10-CM | POA: Insufficient documentation

## 2015-05-15 DIAGNOSIS — I5023 Acute on chronic systolic (congestive) heart failure: Secondary | ICD-10-CM

## 2015-05-15 DIAGNOSIS — I472 Ventricular tachycardia: Secondary | ICD-10-CM | POA: Diagnosis not present

## 2015-05-15 DIAGNOSIS — T451X5A Adverse effect of antineoplastic and immunosuppressive drugs, initial encounter: Secondary | ICD-10-CM | POA: Diagnosis present

## 2015-05-15 DIAGNOSIS — R34 Anuria and oliguria: Secondary | ICD-10-CM | POA: Diagnosis not present

## 2015-05-15 DIAGNOSIS — D61811 Other drug-induced pancytopenia: Secondary | ICD-10-CM | POA: Diagnosis not present

## 2015-05-15 DIAGNOSIS — E43 Unspecified severe protein-calorie malnutrition: Secondary | ICD-10-CM | POA: Diagnosis present

## 2015-05-15 DIAGNOSIS — Z66 Do not resuscitate: Secondary | ICD-10-CM | POA: Diagnosis not present

## 2015-05-15 DIAGNOSIS — I481 Persistent atrial fibrillation: Secondary | ICD-10-CM | POA: Diagnosis present

## 2015-05-15 DIAGNOSIS — R4182 Altered mental status, unspecified: Secondary | ICD-10-CM

## 2015-05-15 DIAGNOSIS — Z85828 Personal history of other malignant neoplasm of skin: Secondary | ICD-10-CM | POA: Diagnosis not present

## 2015-05-15 DIAGNOSIS — Z9841 Cataract extraction status, right eye: Secondary | ICD-10-CM | POA: Diagnosis not present

## 2015-05-15 DIAGNOSIS — I429 Cardiomyopathy, unspecified: Secondary | ICD-10-CM | POA: Diagnosis not present

## 2015-05-15 DIAGNOSIS — D61818 Other pancytopenia: Secondary | ICD-10-CM | POA: Diagnosis not present

## 2015-05-15 DIAGNOSIS — R570 Cardiogenic shock: Secondary | ICD-10-CM | POA: Diagnosis not present

## 2015-05-15 DIAGNOSIS — G7000 Myasthenia gravis without (acute) exacerbation: Secondary | ICD-10-CM | POA: Diagnosis present

## 2015-05-15 DIAGNOSIS — E2740 Unspecified adrenocortical insufficiency: Secondary | ICD-10-CM | POA: Diagnosis present

## 2015-05-15 DIAGNOSIS — E872 Acidosis: Secondary | ICD-10-CM | POA: Diagnosis not present

## 2015-05-15 DIAGNOSIS — G9340 Encephalopathy, unspecified: Secondary | ICD-10-CM | POA: Diagnosis not present

## 2015-05-15 DIAGNOSIS — N189 Chronic kidney disease, unspecified: Secondary | ICD-10-CM

## 2015-05-15 DIAGNOSIS — A419 Sepsis, unspecified organism: Secondary | ICD-10-CM | POA: Diagnosis not present

## 2015-05-15 DIAGNOSIS — Z933 Colostomy status: Secondary | ICD-10-CM | POA: Diagnosis not present

## 2015-05-15 DIAGNOSIS — E785 Hyperlipidemia, unspecified: Secondary | ICD-10-CM | POA: Diagnosis present

## 2015-05-15 DIAGNOSIS — I214 Non-ST elevation (NSTEMI) myocardial infarction: Secondary | ICD-10-CM | POA: Diagnosis not present

## 2015-05-15 DIAGNOSIS — Z96651 Presence of right artificial knee joint: Secondary | ICD-10-CM | POA: Diagnosis present

## 2015-05-15 DIAGNOSIS — G9341 Metabolic encephalopathy: Secondary | ICD-10-CM | POA: Diagnosis not present

## 2015-05-15 DIAGNOSIS — I959 Hypotension, unspecified: Secondary | ICD-10-CM | POA: Diagnosis not present

## 2015-05-15 DIAGNOSIS — R0602 Shortness of breath: Secondary | ICD-10-CM | POA: Diagnosis present

## 2015-05-15 DIAGNOSIS — Z951 Presence of aortocoronary bypass graft: Secondary | ICD-10-CM | POA: Diagnosis not present

## 2015-05-15 DIAGNOSIS — R64 Cachexia: Secondary | ICD-10-CM | POA: Diagnosis present

## 2015-05-15 DIAGNOSIS — E162 Hypoglycemia, unspecified: Secondary | ICD-10-CM | POA: Diagnosis present

## 2015-05-15 DIAGNOSIS — Z885 Allergy status to narcotic agent status: Secondary | ICD-10-CM | POA: Diagnosis not present

## 2015-05-15 DIAGNOSIS — Z950 Presence of cardiac pacemaker: Secondary | ICD-10-CM | POA: Diagnosis not present

## 2015-05-15 DIAGNOSIS — I255 Ischemic cardiomyopathy: Secondary | ICD-10-CM | POA: Diagnosis present

## 2015-05-15 DIAGNOSIS — K5730 Diverticulosis of large intestine without perforation or abscess without bleeding: Secondary | ICD-10-CM | POA: Diagnosis present

## 2015-05-15 DIAGNOSIS — N184 Chronic kidney disease, stage 4 (severe): Secondary | ICD-10-CM | POA: Diagnosis present

## 2015-05-15 DIAGNOSIS — K219 Gastro-esophageal reflux disease without esophagitis: Secondary | ICD-10-CM | POA: Diagnosis present

## 2015-05-15 DIAGNOSIS — Z882 Allergy status to sulfonamides status: Secondary | ICD-10-CM | POA: Diagnosis not present

## 2015-05-15 DIAGNOSIS — J9600 Acute respiratory failure, unspecified whether with hypoxia or hypercapnia: Secondary | ICD-10-CM | POA: Diagnosis not present

## 2015-05-15 DIAGNOSIS — I482 Chronic atrial fibrillation: Secondary | ICD-10-CM | POA: Diagnosis present

## 2015-05-15 DIAGNOSIS — B961 Klebsiella pneumoniae [K. pneumoniae] as the cause of diseases classified elsewhere: Secondary | ICD-10-CM | POA: Diagnosis present

## 2015-05-15 DIAGNOSIS — Z7952 Long term (current) use of systemic steroids: Secondary | ICD-10-CM | POA: Diagnosis not present

## 2015-05-15 DIAGNOSIS — D630 Anemia in neoplastic disease: Secondary | ICD-10-CM | POA: Diagnosis present

## 2015-05-15 DIAGNOSIS — Z7901 Long term (current) use of anticoagulants: Secondary | ICD-10-CM | POA: Diagnosis not present

## 2015-05-15 DIAGNOSIS — R1310 Dysphagia, unspecified: Secondary | ICD-10-CM | POA: Diagnosis present

## 2015-05-15 DIAGNOSIS — K7200 Acute and subacute hepatic failure without coma: Secondary | ICD-10-CM | POA: Diagnosis not present

## 2015-05-15 DIAGNOSIS — M1990 Unspecified osteoarthritis, unspecified site: Secondary | ICD-10-CM | POA: Diagnosis present

## 2015-05-15 DIAGNOSIS — Z881 Allergy status to other antibiotic agents status: Secondary | ICD-10-CM | POA: Diagnosis not present

## 2015-05-15 DIAGNOSIS — Z85048 Personal history of other malignant neoplasm of rectum, rectosigmoid junction, and anus: Secondary | ICD-10-CM | POA: Diagnosis not present

## 2015-05-15 DIAGNOSIS — F17220 Nicotine dependence, chewing tobacco, uncomplicated: Secondary | ICD-10-CM | POA: Diagnosis present

## 2015-05-15 DIAGNOSIS — I2510 Atherosclerotic heart disease of native coronary artery without angina pectoris: Secondary | ICD-10-CM | POA: Diagnosis not present

## 2015-05-15 DIAGNOSIS — J9621 Acute and chronic respiratory failure with hypoxia: Secondary | ICD-10-CM | POA: Diagnosis present

## 2015-05-15 DIAGNOSIS — Y95 Nosocomial condition: Secondary | ICD-10-CM | POA: Diagnosis present

## 2015-05-15 DIAGNOSIS — G4700 Insomnia, unspecified: Secondary | ICD-10-CM | POA: Diagnosis present

## 2015-05-15 DIAGNOSIS — I129 Hypertensive chronic kidney disease with stage 1 through stage 4 chronic kidney disease, or unspecified chronic kidney disease: Secondary | ICD-10-CM | POA: Diagnosis present

## 2015-05-15 DIAGNOSIS — J189 Pneumonia, unspecified organism: Secondary | ICD-10-CM | POA: Diagnosis present

## 2015-05-15 DIAGNOSIS — I4891 Unspecified atrial fibrillation: Secondary | ICD-10-CM | POA: Diagnosis not present

## 2015-05-15 DIAGNOSIS — J9690 Respiratory failure, unspecified, unspecified whether with hypoxia or hypercapnia: Secondary | ICD-10-CM | POA: Diagnosis present

## 2015-05-15 DIAGNOSIS — E780 Pure hypercholesterolemia: Secondary | ICD-10-CM | POA: Diagnosis present

## 2015-05-15 DIAGNOSIS — J9811 Atelectasis: Secondary | ICD-10-CM | POA: Diagnosis not present

## 2015-05-15 LAB — TROPONIN I
TROPONIN I: 1.62 ng/mL — AB (ref <0.031)
Troponin I: 2.31 ng/mL (ref <0.031)

## 2015-05-15 LAB — BASIC METABOLIC PANEL
ANION GAP: 19 — AB (ref 5–15)
Anion gap: 19 — ABNORMAL HIGH (ref 5–15)
Anion gap: 20 — ABNORMAL HIGH (ref 5–15)
BUN: 54 mg/dL — AB (ref 6–20)
BUN: 62 mg/dL — ABNORMAL HIGH (ref 6–20)
BUN: 67 mg/dL — ABNORMAL HIGH (ref 6–20)
CHLORIDE: 95 mmol/L — AB (ref 101–111)
CO2: 24 mmol/L (ref 22–32)
CO2: 19 mmol/L — ABNORMAL LOW (ref 22–32)
CO2: 18 mmol/L — ABNORMAL LOW (ref 22–32)
CREATININE: 4.63 mg/dL — AB (ref 0.61–1.24)
CREATININE: 4.61 mg/dL — AB (ref 0.61–1.24)
Calcium: 9.8 mg/dL (ref 8.9–10.3)
Calcium: 8.6 mg/dL — ABNORMAL LOW (ref 8.9–10.3)
Calcium: 7.7 mg/dL — ABNORMAL LOW (ref 8.9–10.3)
Chloride: 101 mmol/L (ref 101–111)
Chloride: 100 mmol/L — ABNORMAL LOW (ref 101–111)
Creatinine, Ser: 4.1 mg/dL — ABNORMAL HIGH (ref 0.61–1.24)
GFR calc Af Amer: 15 mL/min — ABNORMAL LOW (ref >60)
GFR, EST AFRICAN AMERICAN: 13 mL/min — AB (ref >60)
GFR, EST AFRICAN AMERICAN: 13 mL/min — AB (ref >60)
GFR, EST NON AFRICAN AMERICAN: 13 mL/min — AB (ref >60)
GFR, EST NON AFRICAN AMERICAN: 11 mL/min — AB (ref >60)
GFR, EST NON AFRICAN AMERICAN: 11 mL/min — AB (ref >60)
Glucose, Bld: 82 mg/dL (ref 65–99)
Glucose, Bld: 131 mg/dL — ABNORMAL HIGH (ref 65–99)
Glucose, Bld: 258 mg/dL — ABNORMAL HIGH (ref 65–99)
POTASSIUM: 5.1 mmol/L (ref 3.5–5.1)
POTASSIUM: 3.5 mmol/L (ref 3.5–5.1)
Potassium: 4 mmol/L (ref 3.5–5.1)
Sodium: 138 mmol/L (ref 135–145)
Sodium: 140 mmol/L (ref 135–145)
Sodium: 137 mmol/L (ref 135–145)

## 2015-05-15 LAB — PHOSPHORUS
PHOSPHORUS: 4.6 mg/dL (ref 2.5–4.6)
Phosphorus: 6.9 mg/dL — ABNORMAL HIGH (ref 2.5–4.6)

## 2015-05-15 LAB — URINALYSIS, ROUTINE W REFLEX MICROSCOPIC
GLUCOSE, UA: NEGATIVE mg/dL
Ketones, ur: 15 mg/dL — AB
NITRITE: NEGATIVE
Protein, ur: 100 mg/dL — AB
Specific Gravity, Urine: 1.019 (ref 1.005–1.030)
UROBILINOGEN UA: 1 mg/dL (ref 0.0–1.0)
pH: 5 (ref 5.0–8.0)

## 2015-05-15 LAB — MAGNESIUM
Magnesium: 1.9 mg/dL (ref 1.7–2.4)
Magnesium: 1.7 mg/dL (ref 1.7–2.4)

## 2015-05-15 LAB — BLOOD GAS, ARTERIAL
ACID-BASE DEFICIT: 10.6 mmol/L — AB (ref 0.0–2.0)
Bicarbonate: 14.6 mEq/L — ABNORMAL LOW (ref 20.0–24.0)
Drawn by: 24610
FIO2: 1 %
MECHVT: 580 mL
O2 Saturation: 99.6 %
PEEP: 5 cmH2O
PH ART: 7.305 — AB (ref 7.350–7.450)
PO2 ART: 444 mmHg — AB (ref 80.0–100.0)
Patient temperature: 97.9
RATE: 20 resp/min
TCO2: 15.5 mmol/L (ref 0–100)
pCO2 arterial: 30 mmHg — ABNORMAL LOW (ref 35.0–45.0)

## 2015-05-15 LAB — PREPARE PLATELET PHERESIS
Unit division: 0
Unit division: 0

## 2015-05-15 LAB — RETICULOCYTES
RBC.: 3.16 MIL/uL — ABNORMAL LOW (ref 4.22–5.81)
Retic Count, Absolute: 6.3 10*3/uL — ABNORMAL LOW (ref 19.0–186.0)
Retic Ct Pct: <0.4 % — ABNORMAL LOW (ref 0.4–3.1)

## 2015-05-15 LAB — URINE MICROSCOPIC-ADD ON

## 2015-05-15 LAB — CARBOXYHEMOGLOBIN
CARBOXYHEMOGLOBIN: 0.7 % (ref 0.5–1.5)
METHEMOGLOBIN: 1.2 % (ref 0.0–1.5)
O2 Saturation: 56.1 %
Total hemoglobin: 10.9 g/dL — ABNORMAL LOW (ref 13.5–18.0)

## 2015-05-15 LAB — CBC
HCT: 30.3 % — ABNORMAL LOW (ref 39.0–52.0)
HEMOGLOBIN: 9.9 g/dL — AB (ref 13.0–17.0)
MCH: 31.5 pg (ref 26.0–34.0)
MCHC: 32.7 g/dL (ref 30.0–36.0)
MCV: 96.5 fL (ref 78.0–100.0)
Platelets: 38 10*3/uL — ABNORMAL LOW (ref 150–400)
RBC: 3.14 MIL/uL — AB (ref 4.22–5.81)
RDW: 16.7 % — ABNORMAL HIGH (ref 11.5–15.5)
WBC: 0.3 10*3/uL — AB (ref 4.0–10.5)

## 2015-05-15 LAB — HEPATIC FUNCTION PANEL
ALBUMIN: 2.3 g/dL — AB (ref 3.5–5.0)
ALT: 81 U/L — AB (ref 17–63)
AST: 155 U/L — ABNORMAL HIGH (ref 15–41)
Alkaline Phosphatase: 30 U/L — ABNORMAL LOW (ref 38–126)
Bilirubin, Direct: 3.9 mg/dL — ABNORMAL HIGH (ref 0.1–0.5)
Indirect Bilirubin: 1.7 mg/dL — ABNORMAL HIGH (ref 0.3–0.9)
Total Bilirubin: 5.6 mg/dL — ABNORMAL HIGH (ref 0.3–1.2)
Total Protein: 5.2 g/dL — ABNORMAL LOW (ref 6.5–8.1)

## 2015-05-15 LAB — IRON AND TIBC
Iron: 105 ug/dL (ref 45–182)
SATURATION RATIOS: 63 % — AB (ref 17.9–39.5)
TIBC: 168 ug/dL — ABNORMAL LOW (ref 250–450)
UIBC: 63 ug/dL

## 2015-05-15 LAB — GLUCOSE, CAPILLARY
GLUCOSE-CAPILLARY: 73 mg/dL (ref 65–99)
Glucose-Capillary: 234 mg/dL — ABNORMAL HIGH (ref 65–99)
Glucose-Capillary: 61 mg/dL — ABNORMAL LOW (ref 65–99)
Glucose-Capillary: 70 mg/dL (ref 65–99)

## 2015-05-15 LAB — LACTIC ACID, PLASMA
LACTIC ACID, VENOUS: 8.5 mmol/L — AB (ref 0.5–2.0)
Lactic Acid, Venous: 7.8 mmol/L (ref 0.5–2.0)

## 2015-05-15 LAB — VITAMIN B12: Vitamin B-12: 385 pg/mL (ref 180–914)

## 2015-05-15 LAB — FERRITIN: FERRITIN: 2277 ng/mL — AB (ref 24–336)

## 2015-05-15 LAB — PROTIME-INR
INR: 2.27 — ABNORMAL HIGH (ref 0.00–1.49)
Prothrombin Time: 24.8 seconds — ABNORMAL HIGH (ref 11.6–15.2)

## 2015-05-15 LAB — HAPTOGLOBIN: Haptoglobin: 252 mg/dL — ABNORMAL HIGH (ref 34–200)

## 2015-05-15 MED ORDER — DEXTROSE-NACL 5-0.45 % IV SOLN
INTRAVENOUS | Status: DC
  Administered 2015-05-15 – 2015-05-17 (×3): via INTRAVENOUS

## 2015-05-15 MED ORDER — FENTANYL CITRATE (PF) 100 MCG/2ML IJ SOLN
50.0000 ug | Freq: Once | INTRAMUSCULAR | Status: AC
  Administered 2015-05-15: 50 ug via INTRAVENOUS

## 2015-05-15 MED ORDER — MIDAZOLAM HCL 2 MG/2ML IJ SOLN
INTRAMUSCULAR | Status: AC
  Administered 2015-05-15: 2 mg via INTRAVENOUS
  Filled 2015-05-15: qty 4

## 2015-05-15 MED ORDER — HYDROCORTISONE NA SUCCINATE PF 100 MG IJ SOLR
50.0000 mg | Freq: Four times a day (QID) | INTRAMUSCULAR | Status: DC
  Administered 2015-05-15 – 2015-05-20 (×20): 50 mg via INTRAVENOUS
  Filled 2015-05-15 (×8): qty 1
  Filled 2015-05-15 (×2): qty 2
  Filled 2015-05-15: qty 1
  Filled 2015-05-15: qty 2
  Filled 2015-05-15 (×3): qty 1
  Filled 2015-05-15: qty 2
  Filled 2015-05-15 (×8): qty 1

## 2015-05-15 MED ORDER — PYRIDOSTIGMINE BROMIDE 60 MG/5ML PO SYRP
30.0000 mg | ORAL_SOLUTION | Freq: Three times a day (TID) | ORAL | Status: DC
  Administered 2015-05-15 – 2015-05-20 (×15): 30 mg
  Filled 2015-05-15 (×22): qty 2.5

## 2015-05-15 MED ORDER — METOPROLOL TARTRATE 1 MG/ML IV SOLN
5.0000 mg | Freq: Once | INTRAVENOUS | Status: AC
  Administered 2015-05-15: 5 mg via INTRAVENOUS

## 2015-05-15 MED ORDER — CHLORHEXIDINE GLUCONATE 0.12 % MT SOLN
15.0000 mL | Freq: Two times a day (BID) | OROMUCOSAL | Status: DC
  Administered 2015-05-16 – 2015-05-19 (×8): 15 mL via OROMUCOSAL
  Filled 2015-05-15 (×6): qty 15

## 2015-05-15 MED ORDER — FAMOTIDINE IN NACL 20-0.9 MG/50ML-% IV SOLN
20.0000 mg | INTRAVENOUS | Status: DC
  Administered 2015-05-15 – 2015-05-18 (×4): 20 mg via INTRAVENOUS
  Filled 2015-05-15 (×5): qty 50

## 2015-05-15 MED ORDER — SODIUM BICARBONATE 8.4 % IV SOLN
INTRAVENOUS | Status: AC
  Administered 2015-05-15 – 2015-05-16 (×2): via INTRAVENOUS
  Filled 2015-05-15 (×3): qty 150

## 2015-05-15 MED ORDER — NOREPINEPHRINE BITARTRATE 1 MG/ML IV SOLN
2.0000 ug/min | INTRAVENOUS | Status: DC
  Administered 2015-05-15: 5 ug/min via INTRAVENOUS
  Filled 2015-05-15: qty 4

## 2015-05-15 MED ORDER — VITAL AF 1.2 CAL PO LIQD
1000.0000 mL | ORAL | Status: DC
  Administered 2015-05-15: 1000 mL
  Filled 2015-05-15 (×9): qty 1000

## 2015-05-15 MED ORDER — DEXTROSE 50 % IV SOLN
25.0000 mL | Freq: Once | INTRAVENOUS | Status: AC
  Administered 2015-05-15: 25 mL via INTRAVENOUS

## 2015-05-15 MED ORDER — VITAL HIGH PROTEIN PO LIQD
1000.0000 mL | ORAL | Status: DC
  Administered 2015-05-15: 1000 mL
  Filled 2015-05-15 (×2): qty 1000

## 2015-05-15 MED ORDER — CETYLPYRIDINIUM CHLORIDE 0.05 % MT LIQD
7.0000 mL | Freq: Four times a day (QID) | OROMUCOSAL | Status: DC
  Administered 2015-05-16 – 2015-05-20 (×18): 7 mL via OROMUCOSAL

## 2015-05-15 MED ORDER — FENTANYL CITRATE (PF) 100 MCG/2ML IJ SOLN
INTRAMUSCULAR | Status: AC
  Administered 2015-05-15: 50 ug via INTRAVENOUS
  Filled 2015-05-15: qty 4

## 2015-05-15 MED ORDER — METOPROLOL TARTRATE 1 MG/ML IV SOLN
INTRAVENOUS | Status: AC
  Filled 2015-05-15: qty 5

## 2015-05-15 MED ORDER — ALBUTEROL SULFATE (2.5 MG/3ML) 0.083% IN NEBU: 2.5000 mg | INHALATION_SOLUTION | RESPIRATORY_TRACT | Status: DC | PRN

## 2015-05-15 MED ORDER — FAMOTIDINE IN NACL 20-0.9 MG/50ML-% IV SOLN
20.0000 mg | INTRAVENOUS | Status: DC
  Filled 2015-05-15: qty 50

## 2015-05-15 MED ORDER — MIDAZOLAM HCL 2 MG/2ML IJ SOLN
2.0000 mg | Freq: Once | INTRAMUSCULAR | Status: AC
  Administered 2015-05-15: 2 mg via INTRAVENOUS

## 2015-05-15 MED ORDER — ETOMIDATE 2 MG/ML IV SOLN
20.0000 mg | Freq: Once | INTRAVENOUS | Status: AC
  Administered 2015-05-15: 20 mg via INTRAVENOUS

## 2015-05-15 MED ORDER — DEXTROSE 50 % IV SOLN
INTRAVENOUS | Status: AC
  Filled 2015-05-15: qty 50

## 2015-05-15 MED ORDER — DEXTROSE 5 % IV SOLN
2.0000 g | INTRAVENOUS | Status: DC
  Administered 2015-05-15: 2 g via INTRAVENOUS
  Filled 2015-05-15 (×2): qty 2

## 2015-05-15 MED ORDER — SODIUM CHLORIDE 0.9 % IV BOLUS (SEPSIS)
1000.0000 mL | Freq: Once | INTRAVENOUS | Status: AC
  Administered 2015-05-15: 1000 mL via INTRAVENOUS

## 2015-05-15 MED ORDER — PRO-STAT SUGAR FREE PO LIQD
30.0000 mL | Freq: Two times a day (BID) | ORAL | Status: DC
  Administered 2015-05-15: 30 mL
  Filled 2015-05-15 (×2): qty 30

## 2015-05-15 MED ORDER — DEXTROSE 50 % IV SOLN
1.0000 | Freq: Once | INTRAVENOUS | Status: AC
  Administered 2015-05-15: 50 mL via INTRAVENOUS

## 2015-05-15 MED ORDER — TBO-FILGRASTIM 480 MCG/0.8ML ~~LOC~~ SOSY
480.0000 ug | PREFILLED_SYRINGE | Freq: Every day | SUBCUTANEOUS | Status: DC
  Administered 2015-05-15 – 2015-05-19 (×5): 480 ug via SUBCUTANEOUS
  Filled 2015-05-15 (×7): qty 0.8

## 2015-05-15 MED ORDER — DEXTROSE 5 % IV SOLN
0.0000 ug/min | INTRAVENOUS | Status: DC
  Administered 2015-05-15: 20 ug/min via INTRAVENOUS
  Administered 2015-05-16: 12 ug/min via INTRAVENOUS
  Administered 2015-05-17: 28 ug/min via INTRAVENOUS
  Administered 2015-05-18: 14 ug/min via INTRAVENOUS
  Administered 2015-05-19: 34 ug/min via INTRAVENOUS
  Administered 2015-05-19 (×2): 40 ug/min via INTRAVENOUS
  Administered 2015-05-20: 50 ug/min via INTRAVENOUS
  Filled 2015-05-15 (×11): qty 16

## 2015-05-15 MED ORDER — FENTANYL CITRATE (PF) 100 MCG/2ML IJ SOLN
50.0000 ug | INTRAMUSCULAR | Status: DC | PRN
  Administered 2015-05-15 – 2015-05-16 (×5): 50 ug via INTRAVENOUS
  Filled 2015-05-15 (×4): qty 2

## 2015-05-15 MED ORDER — DEXTROSE 50 % IV SOLN
INTRAVENOUS | Status: AC
  Administered 2015-05-15: 25 mL via INTRAVENOUS
  Filled 2015-05-15: qty 50

## 2015-05-15 MED ORDER — FENTANYL CITRATE (PF) 100 MCG/2ML IJ SOLN
50.0000 ug | INTRAMUSCULAR | Status: AC | PRN
  Administered 2015-05-15 – 2015-05-16 (×3): 50 ug via INTRAVENOUS
  Filled 2015-05-15 (×4): qty 2

## 2015-05-15 NOTE — Consult Note (Signed)
CARDIOLOGY CONSULT NOTE   Patient ID: Nicholas [name] MRN: [id number] DOB/AGE: [birth year] 81 y.o.  Admit date: 05/10/2015  Primary Physician   Leonard Downing, MD Primary Cardiologist   Dr. Mare Ferrari Reason for Consultation   Low EF  HPI: Nicholas [name] is a 81 y.o. male with a history CAD s/p CABG 7654, Chronic systolic CHF (65/0354, EF 40-45%) , HTN, HLD,  GERD, permanent atrial fibrillation on Coumadin, bi-ventricular pacemaker placement, history of rectal cancer (s/p of proctocolectomy, s/p of colostomy 2008), former smoker (quit 23 years ago)  BPH, CKD stage IV, hyperkalemia secondary to renal insufficiency, and MG treated with Imuran + prednisone+ mestinon, admitted on 5/18 due to shortness of breath and cardiology was consulted for acute decompensated CHF with EF of 20-25%.  He has a past history of ischemic heart disease and had coronary artery bypass graft surgery on 05/27/92. He has a past history of compensated congestive heart failure. He had a nuclear stress test in June 2010 showing an old inferior scar but no reversible ischemia and his ejection fraction was 51%.  In October 2015 the patient presented with high-grade AV block and underwent insertion of a [employer] biventricular pacemaker on 10/01/14 by Dr. Rayann Heman. His dyspnea has been improved since his biventricular pacemaker. He was doing well at cardiac point of view when last seen by Dr. Mare Ferrari 03/05/2015.  05/12/15 presented for device check [facility]). CRT pacing improved to 67% on Toprol 75mg . Toprol increased to 100mg  daily with plan to f./u in 1 month.  He came to [facility] 5/18 for SOB and cough. In ED he denied cp, palpitation, abd pain, blood in stool or melena. Patient reported that his body weight increased by 5 pounds recently and given one dose of IV lasix 40mg  in ED. He was found to have pancytopenia with severe thrombocytopenia (38) requiring platelets transfusion. Echo 5/18 showed LV EF of 20-25%, mild LVH, severe  hypokinesis of the basal-mid inferior and inferoseptal myocardium, mild aortic reg, and mild mitral regur. This morning 5/19 patient noted to be completely unresponsive with a glucose of 38. He was given an amp of D50 but his mental status did not improve so he was intubated in the ICU.No further history obtained as currently he remain sedated and intubated. He did not receive narcotics this am or last night.  His Magnesium is 1.9, K 4.0, total bili 5.6, lactic acid 8.5, CXR - bilateral pleural effusions, Platelets 38,000, creatinine 4.63. EKG on presentation Atrial fibrillation with rate of 83, ventricular bigeminy, nonspecific IVCD with LAD, LVH with secondary repolarization abnormality, probable anterior infarct, age indeterminate. No significant change since last tracing.   Past Medical History  Diagnosis Date  . 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 Pure hypercholesterolemia     takes Vytorin daily  . Diverticulosis of colon (without mention of hemorrhage)   . Hypertension     takes Amlodipine,Metoprolol,and Cardura daily  . Coronary artery disease     a. s/p CABG  . CHF (congestive heart failure)     a. s/p STJ CRTD  . H/O blood clots 1993  . Arthritis   . Skin cancer     nose  . GERD (gastroesophageal reflux disease)        . Enlarged prostate        . Insomnia   . CKD (chronic kidney disease) stage 4, GFR 15-29 ml/min   . Permanent atrial fibrillation   . PVC (premature ventricular contraction)   .  Ischemic cardiomyopathy   . HLD (hyperlipidemia)   . Blood transfusion without reported diagnosis      Past Surgical History  Procedure Laterality Date  . Shoulder surgery  2007    left  . Cataract extraction      right  . Abdominoperineal proctocolectomy  06/01/2007  . Coronary angioplasty  05/22/92  . Colostomy  2008  . Colonoscopy    . Coronary artery bypass graft  1993  . Colon surgery  2008    colectomy  . Total knee arthroplasty  02/28/2012    Procedure: TOTAL  KNEE ARTHROPLASTY;  Surgeon: Rudean Haskell, MD;  Location: [place];  Service: Orthopedics;  Laterality: Right;  . Bi-ventricular pacemaker insertion N/A 10/01/2014    SJM Allure Quadra implnated by Dr Rayann Heman for symptomatic bradycadria    Allergies  Allergen Reactions  . Cephalexin Other (See Comments)    "too strong for my system" - per office note 05/05/2012  . Ciprofloxacin Other (See Comments)    Reaction unknown  . Hctz [Hydrochlorothiazide]     DIZZINESS   . Levofloxacin Other (See Comments)    "too strong for my system" - per office note 05/05/2012  . Lipitor [Atorvastatin Calcium]     MUSCLE PAIN  . Niaspan [Niacin Er]     FLUSHING   . Oxycodone-Acetaminophen Other (See Comments)    Reaction unknown  . Sulfonamide Derivatives Other (See Comments)    Reaction unkonwn    I have reviewed the patient's current medications . sodium chloride   Intravenous Once  . antiseptic oral rinse  7 mL Mouth Rinse BID  . B-complex with vitamin C   Oral Daily  . ceFEPime (MAXIPIME) IV  2 g Intravenous Q24H  . famotidine (PEPCID) IV  20 mg Intravenous Q24H  . feeding supplement (ENSURE ENLIVE)  237 mL Oral BID BM  . feeding supplement (PRO-STAT SUGAR FREE 64)  30 mL Per Tube BID  . feeding supplement (VITAL HIGH PROTEIN)  1,000 mL Per Tube Q24H  . ferrous sulfate  325 mg Oral BID WC  . hydrocortisone sodium succinate  50 mg Intravenous Q6H  . pyridostigmine  30 mg Per Tube 3 times per day  . simvastatin  20 mg Oral QHS  . sodium chloride  3 mL Intravenous Q12H  . Tbo-Filgrastim  480 mcg Subcutaneous Daily   . dextrose 5 % and 0.45% NaCl 50 mL/hr at 05/15/15 0935  . norepinephrine (LEVOPHED) Adult infusion 5 mcg/min (05/15/15 0936)   sodium chloride, acetaminophen, albuterol, fentaNYL (SUBLIMAZE) injection, fentaNYL (SUBLIMAZE) injection, sodium chloride  Prior to Admission medications   Medication Sig Start Date End Date Taking? Authorizing Provider  azaTHIOprine (IMURAN) 50 MG  tablet One tab po bid xone week, then 2 tabs po bid 04/11/15  Yes Marcial Pacas, MD  feeding supplement, ENSURE COMPLETE, (ENSURE COMPLETE) LIQD Take 237 mLs by mouth daily. 10/05/14  Yes Barton Dubois, MD  ferrous sulfate 325 (65 FE) MG tablet Take 325 mg by mouth 2 (two) times daily with a meal.    Yes Ladell Pier, MD  finasteride (PROSCAR) 5 MG tablet Take 5 mg by mouth daily.     Yes Historical Provider, MD  furosemide (LASIX) 40 MG tablet Take 1 tablet (40 mg total) by mouth daily. 10/05/14  Yes Barton Dubois, MD  hydrALAZINE (APRESOLINE) 25 MG tablet Take 1 tablet (25 mg total) by mouth 2 (two) times daily. 12/12/14  Yes Darlin Coco, MD  isosorbide dinitrate (ISORDIL) 20 MG  tablet Take 10 mg by mouth 2 (two) times daily. 01/09/15  Yes Historical Provider, MD  metoprolol succinate (TOPROL-XL) 50 MG 24 hr tablet Take 1 tablet (50 mg total) by mouth 2 (two) times daily. 05/12/15  Yes Amber Vertis Kelch, NP  nitroGLYCERIN (NITROSTAT) 0.4 MG SL tablet Place 1 tablet (0.4 mg total) under the tongue every 5 (five) minutes as needed. For chest pain 02/13/13  Yes Darlin Coco, MD  predniSONE (DELTASONE) 10 MG tablet 2 tabs po qam x 2 weeks, then one tab po qam 04/11/15  Yes Marcial Pacas, MD  pyridostigmine (MESTINON) 60 MG tablet 1/2 to one tab po tid 04/11/15  Yes Marcial Pacas, MD  simvastatin (ZOCOR) 20 MG tablet Take 1 tablet (20 mg total) by mouth at bedtime. 07/11/14  Yes Darlin Coco, MD  tamsulosin (FLOMAX) 0.4 MG CAPS capsule Take 1 capsule (0.4 mg total) by mouth daily after supper. 10/05/14  Yes Barton Dubois, MD  vitamin C (ASCORBIC ACID) 500 MG tablet Take 500 mg by mouth daily.     Yes Historical Provider, MD  Vitamins-Lipotropics (B-100 COMPLEX) TABS Take 1 tablet by mouth daily.    Yes Historical Provider, MD  warfarin (COUMADIN) 3 MG tablet TAKE AS DIRECTED PER COUMADIN CLINIC Patient taking differently: Take 1 1/2 tablet everyday except Thursday and Saturday takes one tablet 03/10/15   Yes Darlin Coco, MD     History   Social History  . Marital Status: Married    Spouse Name: N/A  . Number of Children: N/A  . Years of Education: N/A   Occupational History  . retired     Warehouse manager   Social History Main Topics  . Smoking status: Former Smoker -- 10 years    Types: Pipe, Cigars    Quit date: 05/26/1991  . Smokeless tobacco: Current User    Types: Chew  . Alcohol Use: No  . Drug Use: No  . Sexual Activity: Not on file   Other Topics Concern  . Not on file   Social History Narrative   The patient lives with his wife.    Family Status  Relation Status Death Age  . Father Deceased 76  . Mother Deceased 72   Family History  Problem Relation Age of Onset  . Pancreatic cancer Father   . Diabetes Son   . Emphysema Mother   . Anesthesia problems Neg Hx   . Hypotension Neg Hx   . Malignant hyperthermia Neg Hx   . Pseudochol deficiency Neg Hx      ROS:  Full 14 point review of systems complete and found to be negative unless listed above.  Physical Exam: Blood pressure 88/71, pulse 81, temperature 97.8 F (36.6 C), temperature source Oral, resp. rate 21, height 5\' 10"  (1.778 m), weight 155 lb 3.3 oz (70.4 kg), SpO2 100 %.  General: Sedated and intubated. Ill appearing thing man. Head: Eyes PERRLA, No xanthomas. Normocephalic and atraumatic, oropharynx without edema or exudate. NGT with brownish liquid.  Lungs: intubated, diminished breath sound Heart: irregular with normal rate. no s3, s4, or murmur. Pacemaker noted.  Neck: No carotid bruits. No lymphadenopathy. + JVD. Abdomen: Bowel sounds present, abdomen soft and non-tender. Msk:  Moves all extremities occasionally.  Extremities: No clubbing, cyanosis or edema. DP/PT/Radials 1+ and equal bilaterally. Lower feet are cold.  Neuro: sedated and intubated. Occasionally moves all extremities Psych:  sedated Skin: No rashes or skin breakdown  Labs:   Lab Results  Component Value Date  WBC 0.3*  05/15/2015   HGB 9.9* 05/15/2015   HCT 30.3* 05/15/2015   MCV 96.5 05/15/2015   PLT 38* 05/15/2015    Recent Labs  05/15/15 1215  INR 2.27*    Recent Labs Lab 05/15/15 0950  NA 140  K 4.0  CL 101  CO2 19*  BUN 62*  CREATININE 4.63*  CALCIUM 8.6*  GLUCOSE 131*   MAGNESIUM  Date Value Ref Range Status  09/27/2014 2.1 1.5 - 2.5 mg/dL Final    Recent Labs  05/14/15 0843 05/14/15 1647  TROPONINI 0.40* 0.84*    Recent Labs  05/19/2015 2235  TROPIPOC 0.06   Lab Results  Component Value Date   CHOL 120 03/05/2015   HDL 49.60 03/05/2015   LDLCALC 48 03/05/2015   TRIG 110.0 03/05/2015   No results found for: DDIMER LIPASE  Date/Time Value Ref Range Status  11/20/2013 03:00 PM 14 11 - 59 U/L Final   TSH  Date/Time Value Ref Range Status  04/11/2015 11:03 AM 1.950 0.450 - 4.500 uIU/mL Final   T4, TOTAL  Date/Time Value Ref Range Status  04/11/2015 11:03 AM 7.1 4.5 - 12.0 ug/dL Final   VITAMIN B-12  Date/Time Value Ref Range Status  05/15/2015 02:45 AM 385 180 - 914 pg/mL Final    Comment:    (NOTE) This assay is not validated for testing neonatal or myeloproliferative syndrome specimens for Vitamin B12 levels.    FOLATE  Date/Time Value Ref Range Status  11/23/2013 05:57 AM >20.0 ng/mL Final    Comment:    (NOTE) Reference Ranges        Deficient:       0.4 - 3.3 ng/mL        Indeterminate:   3.4 - 5.4 ng/mL        Normal:              > 5.4 ng/mL Performed at [place]  Date/Time Value Ref Range Status  05/15/2015 02:45 AM 2277* 24 - 336 ng/mL Final  10/23/2014 10:13 AM 651* 22 - 316 ng/ml Final   TIBC  Date/Time Value Ref Range Status  05/15/2015 02:45 AM 168* 250 - 450 ug/dL Final  10/23/2014 10:13 AM 230 202 - 409 ug/dL Final   IRON  Date/Time Value Ref Range Status  05/15/2015 02:45 AM 105 45 - 182 ug/dL Final  10/23/2014 10:13 AM 95 42 - 163 ug/dL Final   RETIC %  Date/Time Value Ref Range Status    10/19/2013 04:35 PM 0.78* 0.80 - 1.80 % Final   RETIC CT PCT  Date/Time Value Ref Range Status  05/15/2015 02:45 AM <0.4* 0.4 - 3.1 % Corrected    Comment:    CORRECTED REPORT CALLED TO Herbert Seta [id number] WILDERK CORRECTED ON 05/19 AT 0326: PREVIOUSLY REPORTED AS 0.2     Echo: 05/14/2015 Study Conclusions  - Left ventricle: The cavity size was normal. Wall thickness was increased in a pattern of mild LVH. Systolic function was severely reduced. The estimated ejection fraction was in the range of 20% to 25%. There is severe hypokinesis of the basal-midinferior and inferoseptal myocardium. - Aortic valve: There was mild regurgitation. - Mitral valve: Calcified annulus. Mildly thickened leaflets . There was mild regurgitation. - Left atrium: The atrium was mildly dilated. - Right atrium: The atrium was mildly dilated.   ECG: EKG on presentation Atrial fibrillation with rate of 83, ventricular bigeminy, nonspecific IVCD with LAD, LVH with secondary repolarization abnormality,  probable anterior infarct, age indeterminate. No significant change since last tracing.   Radiology:  Dg Chest 2 View  05/19/2015   CLINICAL DATA:  Shortness of breath for 1 day.  EXAM: CHEST  2 VIEW  COMPARISON:  Chest CT 04/16/2015  FINDINGS: Patient is post median sternotomy and CABG. Dual lead left-sided pacemaker remains in place. Mild cardiomegaly and tortuosity of the thoracic aorta. No pulmonary edema. The lungs are hyperinflated. Minimal blunting of the right costophrenic angle. No confluent airspace disease. No pneumothorax. Left proximal humerus prosthesis in place. No acute osseous abnormalities are seen.  IMPRESSION: 1.  No acute pulmonary process. 2. Hyperinflation. Minimal blunting of right costophrenic angle, may be related to hyperinflation versus small pleural effusion.   Electronically Signed   By: Jeb Levering M.D.   On: 05/09/2015 22:48   Ct Head Wo Contrast  05/15/2015    CLINICAL DATA:  Decreased platelet count, possible CVA or brain bleed  EXAM: CT HEAD WITHOUT CONTRAST  TECHNIQUE: Contiguous axial images were obtained from the base of the skull through the vertex without intravenous contrast.  COMPARISON:  09/27/2014  FINDINGS: No skull fracture is noted. Stable postsurgical changes right maxillary sinus. The mastoid air cells are unremarkable.  No intracranial hemorrhage, mass effect or midline shift. Stable cerebral atrophy. Stable periventricular and patchy subcortical chronic white matter disease. Stable cerebellar atrophy. No acute cortical infarction. No mass lesion is noted on this unenhanced scan.  IMPRESSION: No acute intracranial abnormality. Stable atrophy and chronic white matter disease.   Electronically Signed   By: Lahoma Crocker M.D.   On: 05/15/2015 10:34   Dg Chest Port 1 View  05/15/2015   CLINICAL DATA:  Central line placement.  EXAM: PORTABLE CHEST - 1 VIEW  COMPARISON:  Same day.  FINDINGS: Stable cardiomediastinal silhouette. Stable bibasilar opacities are noted concerning for atelectasis or edema with mild associated pleural effusions. No definite pneumothorax is noted. Left-sided pacemaker is unchanged in position. Status post coronary artery bypass graft. Interval placement of right internal jugular catheter line with distal tip overlying expected position of the SVC. Endotracheal tube has advanced with distal tip less than 1 cm above the carina. Nasogastric tube tip is seen entering stomach.  IMPRESSION: Interval placement of right internal jugular catheter with distal tip overlying expected position of the SVC. No pneumothorax is noted. Stable bibasilar opacities are noted concerning for atelectasis or edema with associated mild pleural effusions.  Endotracheal tube is now seen with distal tip less than 1 cm above the carina. Withdrawal by 2-3 cm is recommended. Critical Value/emergent results were called by telephone at the time of interpretation on  05/15/2015 at 9:15 am to Rosine Door, the patient's nurse, who verbally acknowledged these results.   Electronically Signed   By: Marijo Conception, M.D.   On: 05/15/2015 09:15   Dg Chest Port 1 View  05/15/2015   CLINICAL DATA:  Status post endotracheal tube and nasogastric catheter placement  EXAM: PORTABLE CHEST - 1 VIEW  COMPARISON:  05/15/2015  FINDINGS: Cardiomegaly is again noted. Postsurgical changes are again seen. A new endotracheal tube and nasogastric catheter are noted in satisfactory position. The endotracheal tube is approximately 6.1 cm above the carina. The degree of aeration has improved although suggestion of small effusions are again noted. The degree of vascular congestion has also improved.  IMPRESSION: Improved aeration with suggestion of small pleural effusions.  Endotracheal tube and nasogastric catheter in satisfactory position.   Electronically Signed   By:  Inez Catalina M.D.   On: 05/15/2015 08:07   Dg Chest Port 1 View  05/15/2015   CLINICAL DATA:  Shortness of Breath  EXAM: PORTABLE CHEST - 1 VIEW  COMPARISON:  05/04/2015  FINDINGS: Cardiac shadow remains enlarged. Pacing device and postsurgical changes are again seen. The lungs are well aerated bilaterally. Mild increased vascular congestion is seen as well as small bilateral pleural effusions. No focal infiltrate is noted.  IMPRESSION: Mild vascular congestion and small pleural effusions.   Electronically Signed   By: Inez Catalina M.D.   On: 05/15/2015 07:45    ASSESSMENT AND PLAN:    Principal Problem:   SOB (shortness of breath) Active Problems:   HYPERCHOLESTEROLEMIA   Essential hypertension   GERD   Personal history of malignant neoplasm of rectum, rectosigmoid junction, and anus   Hx of CABG   BPH (benign prostatic hyperplasia)   Coronary artery disease   Atrial fibrillation with slow ventricular response   Acute on chronic systolic HF (heart failure)   CKD (chronic kidney disease) stage 4, GFR 02-54 ml/min    Systolic CHF, chronic   Pancytopenia   Symptomatic anemia   Myasthenia gravis   Anemia   Protein-calorie malnutrition, severe   Acute respiratory failure with hypoxia   Encephalopathy acute   Acute on chronic renal failure   Respiratory failure  1. Acute on chronic systolic failure - Presented with SOB 5/18. He was doing well when last seen by Dr. Mare Ferrari 03/05/15. 2D echo 5/18 showed EF of 20-25%, severe hypokinesis of basal mid inferior and inferoseptal myocardium, EF was 40-45% in 10/15. Pt reported 5lb weight gain recently with worsening SOB. Given IV lasix 40mg  and 40mg  Po yesterday. Net I/O positive 1.9L so far. Yesterday he has 1104ml urine output. Nothing today. I am concern for acute kidney failure. CXR today showed bilateral pleural effusions likely due to IV fluid and in setting of acute illness. Hold lasix for now.   2. Permenent atrial fibrillation - CHADSVASc score is at least 5. (age 58, CHF 1, HTN 1, CAD 1). On Coumadin at home. Coumadin was held on admission due to unclear etiology of pancytopenia. He has CRT therapy 05/12/15. Pacemaker noted.   3. Elevated troponin hx of CAD s/p CABG 1993 -  Magnesium normal. EKG pending. Trop trend 0.40-->0.84. - Will wait for ischemic workup now, monitor with cycle trop and EKG. Reported no chest pain on admission.   4. Pancytopenia -Per primary  5. CKD stage IV - Baseline creatinine 2.6-3.0. His creatinine was 2.82 on 05/26/2015 today 4.63. Also only 100 ml output yesterday and nothing today. Needs further evaluation by nephrology.   6. Acute encephalopathy - Head CT showed no acute intracranial abnormality. Stable atrophy and chronic white matter disease. Cannot have MRI due to pacemaker. Further management per primary.  7. Acute respiratory failure with hypoxemia  - intubated now, full vent support, per primary   8. Hypotension - Held BP meds now given low BP. He was given Levofed to support BP.   SignedLeanor Kail,  [place] 05/15/2015, 12:48 PM Pager [phone]  Co-Sign MD  Personally seen and examined. Agree with above. Gravely ill appearing 81 year old male with extensive cardiac history post bypass in 1993 with biventricular pacemaker, atrial fibrillation, history of rectal cancer with chronic stage IV kidney disease now with acute respiratory failure, acute renal failure and echocardiogram demonstrating reduction in ejection fraction to 20-25% from prior value of 40-45%. Also has thrombocytopenia and myasthenia gravis on chronic  immunosuppression.  Agree with withholding traditional medication such as beta blocker, ACE inhibitor and other antihypertensives especially in the setting of shock. Recent discontinuation of the last hour of norepinephrine. Trying to maintain adequate map to provide renal perfusion. Holding Lasix currently. If creatinine begins to return to baseline, consider reinitiation of IV Lasix. JVD noted.  Continue with supportive care. Once over this acute illness, could consider further ischemic evaluation. Mild elevation in troponin likely secondary to demand ischemia in the setting of shock/hypotension.  We will follow along.  Candee Furbish, MD

## 2015-05-15 NOTE — Progress Notes (Addendum)
Dr. Lake Bells was notified by RN that the radiologist called and stated the the ETT was a critical report and located at the carina. The radiologist recommended pulling the ETT back 2-3cm. MD wished to keep the tube in the same place.

## 2015-05-15 NOTE — Progress Notes (Signed)
IP PROGRESS NOTE  Subjective:   He is known to me with a remote history of rectal cancer. We have followed him for anemia related to chronic renal failure.  He was admitted 05/14/2015 with dyspnea and noted to have severe pancytopenia.  The nursing staff report he was alert and communicating last night.  Objective: Vital signs in last 24 hours: Blood pressure 145/83, pulse 68, temperature 97.9 F (36.6 C), temperature source Oral, resp. rate 25, height $RemoveBe'5\' 10"'wvbxVmoyj$  (1.778 m), weight 155 lb 3.3 oz (70.4 kg), SpO2 100 %.  Intake/Output from previous day: 05/18 0701 - 05/19 0700 In: 1238.7 [P.O.:350; Blood:888.7] Out: 100 [Urine:100]  Physical Exam:  HEENT: No thrush or bleeding Lungs: Distant breath sounds Cardiac: Tachycardia, irregular Abdomen: Soft, no splenomegaly, left lower quadrant colostomy Extremities: No leg edema Skin: The extremities are cool Neurologic: Opens eyes, not following commands, nonverbal    Lab Results:  Recent Labs  05/14/15 1858 05/15/15 0245  WBC 0.2* 0.3*  HGB 8.9* 9.9*  HCT 26.4* 30.3*  PLT 56* 38*    BMET  Recent Labs  05/12/2015 2222 05/15/15 0245  NA 142 138  K 3.6 5.1  CL 100* 95*  CO2 29 24  GLUCOSE 97 82  BUN 40* 54*  CREATININE 2.82* 4.10*  CALCIUM 9.9 9.8   Peripheral blood smear 05/14/2015: Rare lymphocyte, the platelets are markedly decreased in number. Ovalocytes, acanthocytes, and burr cells. The polychromasia is not increased.   Studies/Results: Dg Chest 2 View  05/04/2015   CLINICAL DATA:  Shortness of breath for 1 day.  EXAM: CHEST  2 VIEW  COMPARISON:  Chest CT 04/16/2015  FINDINGS: Patient is post median sternotomy and CABG. Dual lead left-sided pacemaker remains in place. Mild cardiomegaly and tortuosity of the thoracic aorta. No pulmonary edema. The lungs are hyperinflated. Minimal blunting of the right costophrenic angle. No confluent airspace disease. No pneumothorax. Left proximal humerus prosthesis in place. No  acute osseous abnormalities are seen.  IMPRESSION: 1.  No acute pulmonary process. 2. Hyperinflation. Minimal blunting of right costophrenic angle, may be related to hyperinflation versus small pleural effusion.   Electronically Signed   By: Jeb Levering M.D.   On: 05/11/2015 22:48   Dg Chest Port 1 View  05/15/2015   CLINICAL DATA:  Status post endotracheal tube and nasogastric catheter placement  EXAM: PORTABLE CHEST - 1 VIEW  COMPARISON:  05/15/2015  FINDINGS: Cardiomegaly is again noted. Postsurgical changes are again seen. A new endotracheal tube and nasogastric catheter are noted in satisfactory position. The endotracheal tube is approximately 6.1 cm above the carina. The degree of aeration has improved although suggestion of small effusions are again noted. The degree of vascular congestion has also improved.  IMPRESSION: Improved aeration with suggestion of small pleural effusions.  Endotracheal tube and nasogastric catheter in satisfactory position.   Electronically Signed   By: Inez Catalina M.D.   On: 05/15/2015 08:07   Dg Chest Port 1 View  05/15/2015   CLINICAL DATA:  Shortness of Breath  EXAM: PORTABLE CHEST - 1 VIEW  COMPARISON:  05/21/2015  FINDINGS: Cardiac shadow remains enlarged. Pacing device and postsurgical changes are again seen. The lungs are well aerated bilaterally. Mild increased vascular congestion is seen as well as small bilateral pleural effusions. No focal infiltrate is noted.  IMPRESSION: Mild vascular congestion and small pleural effusions.   Electronically Signed   By: Inez Catalina M.D.   On: 05/15/2015 07:45    Medications: I have reviewed  the patient's current medications.  Assessment/Plan:  1. Severe pancytopenia-most likely related to Imuran therapy, status post red cell and platelet transfusions during this hospital admission  2. Chronic anemia secondary to renal insufficiency versus myelodysplasia  3. Chronic/acute renal failure  4. Myasthenia  gravis-maintained on Imuran prior to hospital admission  5. Atrial fibrillation  6. Acute altered mental status  7. Congestive heart failure  He has chronic anemia and mild thrombocytopenia, potentially related to underlying myelodysplasia. He presented with severe acute pancytopenia after being started on Imuran last month. I suspect the pancytopenia is related to Imuran, but it is possible he has an underlying hematopoietic condition such as conversion to acute leukemia.  He has an altered mental status this morning and there is concern for underlying sepsis.  Recommendations: 1. Hold Imuran 2. Transfuse with packed red blood cells and platelets as needed 3. Begin G-CSF 4. Management of acute alteration in mental status/sepsis syndrome per internal medicine service 5. Bone marrow biopsy if the pancytopenia does not improve while off of Imuran    LOS: 1 day   [place]  05/15/2015, 8:30 AM

## 2015-05-15 NOTE — Progress Notes (Signed)
Pt had 619mL of residual prior to starting tube feed. Dr. Nelda Marseille advised to discard the residual and proceed with the tube feed.

## 2015-05-15 NOTE — Progress Notes (Signed)
PT Cancellation Note  Patient Details Name: ASEEM SESSUMS MRN: [id number] DOB: 01/29/1934   Cancelled Treatment:    Reason Eval/Treat Not Completed: Per communication with OT, pt intubated this am and therapies to sign off per MD. If needs change, please reconsult.   Rolinda Roan 05/15/2015, 1:21 PM   Rolinda Roan, PT, DPT Acute Rehabilitation Services Pager: [phone]

## 2015-05-15 NOTE — Progress Notes (Signed)
CRITICAL VALUE ALERT  Critical value received:  Lactic Acid 8.5  Date of notification:  05/15/15  Time of notification:  0350  Critical value read back:Yes.    Nurse who received alert:  Rosine Door RN   MD notified (1st page):  Dr. Reita Cliche  Time of first page:  1330  MD notified (2nd page):  Time of second page:  Responding MD:  Dr. Lake Bells  Time MD responded:  1330

## 2015-05-15 NOTE — Progress Notes (Signed)
ANTIBIOTIC CONSULT NOTE - INITIAL  Pharmacy Consult:  Cefepime Indication:  Rule out HCAP / febrile neutropenia  Allergies  Allergen Reactions  . Cephalexin Other (See Comments)    "too strong for my system" - per office note 05/05/2012  . Ciprofloxacin Other (See Comments)    Reaction unknown  . Hctz [Hydrochlorothiazide]     DIZZINESS   . Levofloxacin Other (See Comments)    "too strong for my system" - per office note 05/05/2012  . Lipitor [Atorvastatin Calcium]     MUSCLE PAIN  . Niaspan [Niacin Er]     FLUSHING   . Oxycodone-Acetaminophen Other (See Comments)    Reaction unknown  . Sulfonamide Derivatives Other (See Comments)    Reaction unkonwn    Patient Measurements: Height: 5\' 10"  (177.8 cm) Weight: 155 lb 3.3 oz (70.4 kg) IBW/kg (Calculated) : 73  Vital Signs: Temp: 97.9 F (36.6 C) (05/19 0821) Temp Source: Oral (05/19 0821) BP: 145/83 mmHg (05/19 0700) Pulse Rate: 68 (05/19 0700) Intake/Output from previous day: 05/18 0701 - 05/19 0700 In: 1238.7 [P.O.:350; Blood:888.7] Out: 100 [Urine:100]  Labs:  Recent Labs  05/23/2015 2222  05/14/15 1647 05/14/15 1858 05/15/15 0245  WBC  --   < > 0.1* 0.2* 0.3*  HGB  --   < > 8.6* 8.9* 9.9*  PLT  --   < > 36* 56* 38*  CREATININE 2.82*  --   --   --  4.10*  < > = values in this interval not displayed. Estimated Creatinine Clearance: 14.8 mL/min (by C-G formula based on Cr of 4.1). No results for input(s): VANCOTROUGH, VANCOPEAK, VANCORANDOM, GENTTROUGH, GENTPEAK, GENTRANDOM, TOBRATROUGH, TOBRAPEAK, TOBRARND, AMIKACINPEAK, AMIKACINTROU, AMIKACIN in the last 72 hours.   Microbiology: Recent Results (from the past 720 hour(s))  MRSA PCR Screening     Status: None   Collection Time: 05/14/15  6:48 AM  Result Value Ref Range Status   MRSA by PCR NEGATIVE NEGATIVE Final    Comment:        The GeneXpert MRSA Assay (FDA approved for NASAL specimens only), is one component of a comprehensive MRSA  colonization surveillance program. It is not intended to diagnose MRSA infection nor to guide or monitor treatment for MRSA infections.     Medical History: Past Medical History  Diagnosis Date  . 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 Pure hypercholesterolemia     takes Vytorin daily  . Diverticulosis of colon (without mention of hemorrhage)   . Hypertension     takes Amlodipine,Metoprolol,and Cardura daily  . Coronary artery disease     a. s/p CABG  . CHF (congestive heart failure)     a. s/p STJ CRTD  . H/O blood clots 1993  . Arthritis   . Skin cancer     nose  . GERD (gastroesophageal reflux disease)        . Enlarged prostate        . Insomnia   . CKD (chronic kidney disease) stage 4, GFR 15-29 ml/min   . Permanent atrial fibrillation   . PVC (premature ventricular contraction)   . Ischemic cardiomyopathy   . HLD (hyperlipidemia)   . Blood transfusion without reported diagnosis       Assessment: 29 YOM admitted with pancytopenia, now with AKI and respiratory failure requiring intubation.  Pharmacy consulted to manage cefepime for rule out HCAP and febrile neutropenia.   Goal of Therapy:  Resolution of infection / Infection prevention   Plan:  -  Cefepime 2gm IV Q24H - Monitor renal fxn closely, micro data, clinical progress - Add MRSA coverage if worsens    Korynne Dols D. Mina Marble, PharmD, BCPS Pager:  [phone] 05/15/2015, 8:37 AM

## 2015-05-15 NOTE — Consult Note (Signed)
Referring Physician: Dr. Tana Coast    Chief Complaint: unresponsiveness  HPI:                                                                                                                                         Nicholas [name] is an 81 y.o. male with multiple medical problems including hypertension, hyperlipidemia, GERD, atrial fibrillation on Coumadin, bi-ventricular pacemaker placement, history of rectal cancer (s/p of proctocolectomy, s/p of colostomy 2008), coronary artery disease (s/p of CABG), chronic systolic congestive heart failure (EF of 40-45%), BPH, chronic kidney disease-stage IV, and MG treated with Imuran + prednisone+ mestinon, admitted on 5/18 due to shortness of breath. Work up reveals pancytopenia with severe thrombocytopenia (38) requiring platelets transfusion. This morning patient noted to be completely unresponsive and he was sedated and intubated for airway protection. At this moment, he is sedated, intubated on he vent. As per critical care attending patient was moving all limbs during intubation and essentially looked encephalopathic. Did not receive narcotics this am or last night, had marked hypoglycemia in the low 30's around shift change and was administered 1 amp D50% that brought the glucose value above 250. Serologies significant for Cr>4, platelets 38,000. Date last known well: 05/15/15 Time last known well: around 730 am tPA Given:no, severe thrombocytopenia   Past Medical History  Diagnosis Date  . 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 Pure hypercholesterolemia     takes Vytorin daily  . Diverticulosis of colon (without mention of hemorrhage)   . Hypertension     takes Amlodipine,Metoprolol,and Cardura daily  . Coronary artery disease     a. s/p CABG  . CHF (congestive heart failure)     a. s/p STJ CRTD  . H/O blood clots 1993  . Arthritis   . Skin cancer     nose  . GERD (gastroesophageal reflux disease)        . Enlarged prostate        . Insomnia    . CKD (chronic kidney disease) stage 4, GFR 15-29 ml/min   . Permanent atrial fibrillation   . PVC (premature ventricular contraction)   . Ischemic cardiomyopathy   . HLD (hyperlipidemia)   . Blood transfusion without reported diagnosis     Past Surgical History  Procedure Laterality Date  . Shoulder surgery  2007    left  . Cataract extraction      right  . Abdominoperineal proctocolectomy  06/01/2007  . Coronary angioplasty  05/22/92  . Colostomy  2008  . Colonoscopy    . Coronary artery bypass graft  1993  . Colon surgery  2008    colectomy  . Total knee arthroplasty  02/28/2012    Procedure: TOTAL KNEE ARTHROPLASTY;  Surgeon: Rudean Haskell, MD;  Location: [facility];  Service: Orthopedics;  Laterality: Right;  . Bi-ventricular pacemaker insertion N/A 10/01/2014  SJM Allure Quadra implnated by Dr Rayann Heman for symptomatic bradycadria    Family History  Problem Relation Age of Onset  . Pancreatic cancer Father   . Diabetes Son   . Emphysema Mother   . Anesthesia problems Neg Hx   . Hypotension Neg Hx   . Malignant hyperthermia Neg Hx   . Pseudochol deficiency Neg Hx    Social History:  reports that he quit smoking about 23 years ago. His smoking use included Pipe and Cigars. His smokeless tobacco use includes Chew. He reports that he does not drink alcohol or use illicit drugs. Family history: unable to obtain due to mental status Allergies:  Allergies  Allergen Reactions  . Cephalexin Other (See Comments)    "too strong for my system" - per office note 05/05/2012  . Ciprofloxacin Other (See Comments)    Reaction unknown  . Hctz [Hydrochlorothiazide]     DIZZINESS   . Levofloxacin Other (See Comments)    "too strong for my system" - per office note 05/05/2012  . Lipitor [Atorvastatin Calcium]     MUSCLE PAIN  . Niaspan [Niacin Er]     FLUSHING   . Oxycodone-Acetaminophen Other (See Comments)    Reaction unknown  . Sulfonamide Derivatives Other (See Comments)     Reaction unkonwn    Medications:                                                                                                                           Scheduled: . sodium chloride   Intravenous Once  . antiseptic oral rinse  7 mL Mouth Rinse BID  . B-complex with vitamin C   Oral Daily  . etomidate  20 mg Intravenous Once  . feeding supplement (ENSURE ENLIVE)  237 mL Oral BID BM  . fentaNYL      . fentaNYL (SUBLIMAZE) injection  50 mcg Intravenous Once  . ferrous sulfate  325 mg Oral BID WC  . finasteride  5 mg Oral Daily  . hydrALAZINE  25 mg Oral BID  . isosorbide dinitrate  10 mg Oral BID WC  . metoprolol succinate  50 mg Oral BID  . midazolam      . midazolam  2 mg Intravenous Once  . predniSONE  10 mg Oral Q breakfast  . pyridostigmine  30 mg Oral 3 times per day  . simvastatin  20 mg Oral QHS  . sodium chloride  3 mL Intravenous Q12H  . tamsulosin  0.4 mg Oral QPC supper  . vitamin C  500 mg Oral Daily    ROS: unable to obtain due to mental status  History obtained from chart review   Physical exam: sedated, intubated on the vent. Blood pressure 145/83, pulse 68, temperature 98 F (36.7 C), temperature source Oral, resp. rate 25, height $RemoveBe'5\' 10"'FOGxbppyJ$  (1.778 m), weight 70.4 kg (155 lb 3.3 oz), SpO2 100 %. Head: normocephalic. Neck: supple, no bruits, no JVD. Cardiac: no murmurs. Lungs: clear. Abdomen: soft, no tender, no mass. Extremities: no edema. Skin: no rash  Neurologic Examination:                                                                                                      Mental status: sedated, intubated on the vent. CN 2-12: pinpoint pupils, poorly reactive. No gaze preference. No nystagmus on primary gaze. Doesn't blink to threat but corneal reflexes are very weak. Face appears symmetric. Tongue: intubated. Motor: no spontaneous or  pain induced motor movements. Sensory: does not react to pain. DTR's: unable to elicit. Plantars: mute. Coordination and gait: unable to test.  Results for orders placed or performed during the hospital encounter of 04/27/2015 (from the past 48 hour(s))  Basic metabolic panel     Status: Abnormal   Collection Time: 05/09/2015 10:22 PM  Result Value Ref Range   Sodium 142 135 - 145 mmol/L   Potassium 3.6 3.5 - 5.1 mmol/L   Chloride 100 (L) 101 - 111 mmol/L   CO2 29 22 - 32 mmol/L   Glucose, Bld 97 65 - 99 mg/dL   BUN 40 (H) 6 - 20 mg/dL   Creatinine, Ser 2.82 (H) 0.61 - 1.24 mg/dL   Calcium 9.9 8.9 - 10.3 mg/dL   GFR calc non Af Amer 20 (L) >60 mL/min   GFR calc Af Amer 23 (L) >60 mL/min    Comment: (NOTE) The eGFR has been calculated using the CKD EPI equation. This calculation has not been validated in all clinical situations. eGFR's persistently <60 mL/min signify possible Chronic Kidney Disease.    Anion gap 13 5 - 15  Brain natriuretic peptide     Status: Abnormal   Collection Time: 05/01/2015 10:22 PM  Result Value Ref Range   B Natriuretic Peptide 1324.5 (H) 0.0 - 100.0 pg/mL  I-stat troponin, ED     Status: None   Collection Time: 05/26/2015 10:35 PM  Result Value Ref Range   Troponin i, poc 0.06 0.00 - 0.08 ng/mL   Comment 3            Comment: Due to the release kinetics of cTnI, a negative result within the first hours of the onset of symptoms does not rule out myocardial infarction with certainty. If myocardial infarction is still suspected, repeat the test at appropriate intervals.   CBC with Differential     Status: Abnormal   Collection Time: 05/16/2015 11:48 PM  Result Value Ref Range   WBC 0.1 (LL) 4.0 - 10.5 K/uL    Comment: REPEATED TO VERIFY RESULTS VERIFIED VIA RECOLLECT WHITE COUNT CONFIRMED ON SMEAR CRITICAL RESULT CALLED TO, READ BACK BY AND VERIFIED WITH: Owens Shark RN [phone] GREEN R    RBC 2.04 (  L) 4.22 - 5.81 MIL/uL   Hemoglobin 6.7 (LL) 13.0 -  17.0 g/dL    Comment: REPEATED TO VERIFY RESULTS VERIFIED VIA RECOLLECT CRITICAL RESULT CALLED TO, READ BACK BY AND VERIFIED WITH: Owens Shark RN [phone] GREEN R    HCT 20.8 (L) 39.0 - 52.0 %   MCV 102.0 (H) 78.0 - 100.0 fL   MCH 32.8 26.0 - 34.0 pg   MCHC 32.2 30.0 - 36.0 g/dL   RDW 14.1 11.5 - 15.5 %   Platelets 8 (LL) 150 - 400 K/uL    Comment: RESULTS VERIFIED VIA RECOLLECT REPEATED TO VERIFY CRITICAL RESULT CALLED TO, READ BACK BY AND VERIFIED WITH: Owens Shark RN [phone] [id number] GREEN R PLATELET COUNT CONFIRMED BY SMEAR    Neutrophils Relative % 23 (L) 43 - 77 %   Lymphocytes Relative 69 (H) 12 - 46 %   Monocytes Relative 8 3 - 12 %   Eosinophils Relative 0 0 - 5 %   Basophils Relative 0 0 - 1 %   Neutro Abs 0.0 (L) 1.7 - 7.7 K/uL   Lymphs Abs 0.1 (L) 0.7 - 4.0 K/uL   Monocytes Absolute 0.0 (L) 0.1 - 1.0 K/uL   Eosinophils Absolute 0.0 0.0 - 0.7 K/uL   Basophils Absolute 0.0 0.0 - 0.1 K/uL   RBC Morphology ELLIPTOCYTES     Comment: TARGET CELLS  Protime-INR     Status: Abnormal   Collection Time: 05/05/2015 11:48 PM  Result Value Ref Range   Prothrombin Time 19.5 (H) 11.6 - 15.2 seconds   INR 1.64 (H) 0.00 - 1.49  Pathologist smear review     Status: None   Collection Time: 05/21/2015 11:48 PM  Result Value Ref Range   Path Review PANCYTOPENIA INCLUDING     Comment: MARKED NEUTROPENIA. Reviewed by Chrystie Nose. Saralyn Pilar, M.D. 05/14/15.   Prepare RBC     Status: None   Collection Time: 05/14/15  1:00 AM  Result Value Ref Range   Order Confirmation ORDER PROCESSED BY BLOOD BANK   Type and screen     Status: None (Preliminary result)   Collection Time: 05/14/15  1:19 AM  Result Value Ref Range   ABO/RH(D) A POS    Antibody Screen NEG    Sample Expiration 05/17/2015    Unit Number [id number]    Blood Component Type RED CELLS,LR    Unit division 00    Status of Unit ISSUED    Transfusion Status OK TO TRANSFUSE    Crossmatch Result COMPATIBLE    Donor AG Type NEGATIVE FOR  E ANTIGEN    Unit Number [id number]    Blood Component Type RED CELLS,LR    Unit division 00    Status of Unit ISSUED    Transfusion Status OK TO TRANSFUSE    Crossmatch Result COMPATIBLE    Donor AG Type NEGATIVE FOR E ANTIGEN   CBC     Status: Abnormal   Collection Time: 05/14/15  1:19 AM  Result Value Ref Range   WBC 0.2 (LL) 4.0 - 10.5 K/uL    Comment: REPEATED TO VERIFY CRITICAL VALUE NOTED.  VALUE IS CONSISTENT WITH PREVIOUSLY REPORTED AND CALLED VALUE.    RBC 2.12 (L) 4.22 - 5.81 MIL/uL   Hemoglobin 6.9 (LL) 13.0 - 17.0 g/dL    Comment: REPEATED TO VERIFY CRITICAL VALUE NOTED.  VALUE IS CONSISTENT WITH PREVIOUSLY REPORTED AND CALLED VALUE.    HCT 21.6 (L) 39.0 - 52.0 %   MCV 101.9 (H)  78.0 - 100.0 fL   MCH 32.5 26.0 - 34.0 pg   MCHC 31.9 30.0 - 36.0 g/dL   RDW 14.3 11.5 - 15.5 %   Platelets 8 (LL) 150 - 400 K/uL    Comment: REPEATED TO VERIFY CRITICAL VALUE NOTED.  VALUE IS CONSISTENT WITH PREVIOUSLY REPORTED AND CALLED VALUE. PLATELET COUNT CONFIRMED BY SMEAR   Urinalysis, Routine w reflex microscopic     Status: Abnormal   Collection Time: 05/14/15  4:25 AM  Result Value Ref Range   Color, Urine AMBER (A) YELLOW    Comment: BIOCHEMICALS MAY BE AFFECTED BY COLOR   APPearance CLOUDY (A) CLEAR   Specific Gravity, Urine 1.011 1.005 - 1.030   pH 5.0 5.0 - 8.0   Glucose, UA NEGATIVE NEGATIVE mg/dL   Hgb urine dipstick SMALL (A) NEGATIVE   Bilirubin Urine NEGATIVE NEGATIVE   Ketones, ur NEGATIVE NEGATIVE mg/dL   Protein, ur 30 (A) NEGATIVE mg/dL   Urobilinogen, UA 1.0 0.0 - 1.0 mg/dL   Nitrite NEGATIVE NEGATIVE   Leukocytes, UA NEGATIVE NEGATIVE  Urine microscopic-add on     Status: Abnormal   Collection Time: 05/14/15  4:25 AM  Result Value Ref Range   Squamous Epithelial / LPF FEW (A) RARE   WBC, UA 0-2 <3 WBC/hpf   RBC / HPF 11-20 <3 RBC/hpf   Bacteria, UA FEW (A) RARE   Casts HYALINE CASTS (A) NEGATIVE   Urine-Other MUCOUS PRESENT   Glucose, capillary      Status: None   Collection Time: 05/14/15  5:23 AM  Result Value Ref Range   Glucose-Capillary 76 65 - 99 mg/dL  MRSA PCR Screening     Status: None   Collection Time: 05/14/15  6:48 AM  Result Value Ref Range   MRSA by PCR NEGATIVE NEGATIVE    Comment:        The GeneXpert MRSA Assay (FDA approved for NASAL specimens only), is one component of a comprehensive MRSA colonization surveillance program. It is not intended to diagnose MRSA infection nor to guide or monitor treatment for MRSA infections.   Prepare Pheresed Platelets     Status: None (Preliminary result)   Collection Time: 05/14/15  7:30 AM  Result Value Ref Range   Unit Number [id number]    Blood Component Type PLTP LR1 PAS    Unit division 00    Status of Unit ISSUED    Transfusion Status OK TO TRANSFUSE    Unit Number [id number]    Blood Component Type PLTPHER LR2    Unit division 00    Status of Unit ISSUED    Transfusion Status OK TO TRANSFUSE   Haptoglobin     Status: Abnormal   Collection Time: 05/14/15  8:43 AM  Result Value Ref Range   Haptoglobin 252 (H) 34 - 200 mg/dL    Comment: (NOTE) Performed At: [facility] [place], [place] [id number] Lindon Romp MD [id number]   Lactate dehydrogenase     Status: None   Collection Time: 05/14/15  8:43 AM  Result Value Ref Range   LDH 145 98 - 192 U/L  Troponin I (q 6hr x 3)     Status: Abnormal   Collection Time: 05/14/15  8:43 AM  Result Value Ref Range   Troponin I 0.40 (H) <0.031 ng/mL    Comment:        PERSISTENTLY INCREASED TROPONIN VALUES IN THE RANGE OF 0.04-0.49 ng/mL CAN BE SEEN IN:       -  UNSTABLE ANGINA       -CONGESTIVE HEART FAILURE       -MYOCARDITIS       -CHEST TRAUMA       -ARRYHTHMIAS       -LATE PRESENTING MYOCARDIAL INFARCTION       -COPD   CLINICAL FOLLOW-UP RECOMMENDED.   Troponin I (q 6hr x 3)     Status: Abnormal   Collection Time: 05/14/15  4:47 PM  Result Value Ref Range    Troponin I 0.84 (HH) <0.031 ng/mL    Comment:        POSSIBLE MYOCARDIAL ISCHEMIA. SERIAL TESTING RECOMMENDED. REPEATED TO VERIFY CRITICAL RESULT CALLED TO, READ BACK BY AND VERIFIED WITH: T HARVEY,RN 05.18.16 1802 M SHIPMAN   CBC     Status: Abnormal   Collection Time: 05/14/15  4:47 PM  Result Value Ref Range   WBC 0.1 (LL) 4.0 - 10.5 K/uL    Comment: REPEATED TO VERIFY SPECIMEN CHECKED FOR CLOTS CRITICAL VALUE NOTED.  VALUE IS CONSISTENT WITH PREVIOUSLY REPORTED AND CALLED VALUE.    RBC 2.67 (L) 4.22 - 5.81 MIL/uL   Hemoglobin 8.6 (L) 13.0 - 17.0 g/dL   HCT 25.5 (L) 39.0 - 52.0 %   MCV 95.5 78.0 - 100.0 fL   MCH 32.2 26.0 - 34.0 pg   MCHC 33.7 30.0 - 36.0 g/dL   RDW 16.7 (H) 11.5 - 15.5 %   Platelets 36 (L) 150 - 400 K/uL    Comment: REPEATED TO VERIFY SPECIMEN CHECKED FOR CLOTS CONSISTENT WITH PREVIOUS RESULT   Save smear     Status: None   Collection Time: 05/14/15  4:47 PM  Result Value Ref Range   Smear Review SMEAR STAINED AND AVAILABLE FOR REVIEW   CBC     Status: Abnormal   Collection Time: 05/14/15  6:58 PM  Result Value Ref Range   WBC 0.2 (LL) 4.0 - 10.5 K/uL    Comment: REPEATED TO VERIFY CRITICAL VALUE NOTED.  VALUE IS CONSISTENT WITH PREVIOUSLY REPORTED AND CALLED VALUE.    RBC 2.74 (L) 4.22 - 5.81 MIL/uL   Hemoglobin 8.9 (L) 13.0 - 17.0 g/dL   HCT 26.4 (L) 39.0 - 52.0 %   MCV 96.4 78.0 - 100.0 fL   MCH 32.5 26.0 - 34.0 pg   MCHC 33.7 30.0 - 36.0 g/dL   RDW 16.9 (H) 11.5 - 15.5 %   Platelets 56 (L) 150 - 400 K/uL    Comment: REPEATED TO VERIFY CONSISTENT WITH PREVIOUS RESULT   CBC     Status: Abnormal   Collection Time: 05/15/15  2:45 AM  Result Value Ref Range   WBC 0.3 (LL) 4.0 - 10.5 K/uL    Comment: REPEATED TO VERIFY CRITICAL VALUE NOTED.  VALUE IS CONSISTENT WITH PREVIOUSLY REPORTED AND CALLED VALUE.    RBC 3.14 (L) 4.22 - 5.81 MIL/uL   Hemoglobin 9.9 (L) 13.0 - 17.0 g/dL   HCT 30.3 (L) 39.0 - 52.0 %   MCV 96.5 78.0 - 100.0 fL   MCH  31.5 26.0 - 34.0 pg   MCHC 32.7 30.0 - 36.0 g/dL   RDW 16.7 (H) 11.5 - 15.5 %   Platelets 38 (L) 150 - 400 K/uL    Comment: REPEATED TO VERIFY CONSISTENT WITH PREVIOUS RESULT   Basic metabolic panel     Status: Abnormal   Collection Time: 05/15/15  2:45 AM  Result Value Ref Range   Sodium 138 135 - 145 mmol/L   Potassium  5.1 3.5 - 5.1 mmol/L    Comment: DELTA CHECK NOTED   Chloride 95 (L) 101 - 111 mmol/L   CO2 24 22 - 32 mmol/L   Glucose, Bld 82 65 - 99 mg/dL   BUN 54 (H) 6 - 20 mg/dL   Creatinine, Ser 4.10 (H) 0.61 - 1.24 mg/dL    Comment: DELTA CHECK NOTED   Calcium 9.8 8.9 - 10.3 mg/dL   GFR calc non Af Amer 13 (L) >60 mL/min   GFR calc Af Amer 15 (L) >60 mL/min    Comment: (NOTE) The eGFR has been calculated using the CKD EPI equation. This calculation has not been validated in all clinical situations. eGFR's persistently <60 mL/min signify possible Chronic Kidney Disease.    Anion gap 19 (H) 5 - 15  Reticulocytes     Status: Abnormal   Collection Time: 05/15/15  2:45 AM  Result Value Ref Range   Retic Ct Pct <0.4 (L) 0.4 - 3.1 %    Comment: CORRECTED REPORT CALLED TO Herbert Seta [id number] WILDERK CORRECTED ON 05/19 AT 0326: PREVIOUSLY REPORTED AS 0.2    RBC. 3.16 (L) 4.22 - 5.81 MIL/uL   Retic Count, Manual 6.3 (L) 19.0 - 186.0 K/uL  Ferritin     Status: Abnormal   Collection Time: 05/15/15  2:45 AM  Result Value Ref Range   Ferritin 2277 (H) 24 - 336 ng/mL  Iron and TIBC     Status: Abnormal   Collection Time: 05/15/15  2:45 AM  Result Value Ref Range   Iron 105 45 - 182 ug/dL   TIBC 168 (L) 250 - 450 ug/dL   Saturation Ratios 63 (H) 17.9 - 39.5 %   UIBC 63 ug/dL  Vitamin B12     Status: None   Collection Time: 05/15/15  2:45 AM  Result Value Ref Range   Vitamin B-12 385 180 - 914 pg/mL    Comment: (NOTE) This assay is not validated for testing neonatal or myeloproliferative syndrome specimens for Vitamin B12 levels.    Dg Chest 2 View  05/06/2015    CLINICAL DATA:  Shortness of breath for 1 day.  EXAM: CHEST  2 VIEW  COMPARISON:  Chest CT 04/16/2015  FINDINGS: Patient is post median sternotomy and CABG. Dual lead left-sided pacemaker remains in place. Mild cardiomegaly and tortuosity of the thoracic aorta. No pulmonary edema. The lungs are hyperinflated. Minimal blunting of the right costophrenic angle. No confluent airspace disease. No pneumothorax. Left proximal humerus prosthesis in place. No acute osseous abnormalities are seen.  IMPRESSION: 1.  No acute pulmonary process. 2. Hyperinflation. Minimal blunting of right costophrenic angle, may be related to hyperinflation versus small pleural effusion.   Electronically Signed   By: Jeb Levering M.D.   On: 05/27/2015 22:48   Dg Chest Port 1 View  05/15/2015   CLINICAL DATA:  Shortness of Breath  EXAM: PORTABLE CHEST - 1 VIEW  COMPARISON:  05/06/2015  FINDINGS: Cardiac shadow remains enlarged. Pacing device and postsurgical changes are again seen. The lungs are well aerated bilaterally. Mild increased vascular congestion is seen as well as small bilateral pleural effusions. No focal infiltrate is noted.  IMPRESSION: Mild vascular congestion and small pleural effusions.   Electronically Signed   By: Inez Catalina M.D.   On: 05/15/2015 07:45     Assessment: 82 y.o. male with multiple medical problems including MG, severe thrombocytopenia, atrial fibrillation, now with acute onset unresponsiveness requiring intubation. Of note, patient had marked  hypoglycemia in the low 30's this am for which he was given 1 am D50%. Severe thrombocytopenia raises concern for hemorrhagic stroke, but he was initially reported as being non focal and encephalopathic and therefore a hypoglycemic encephalopathy also in the differential. A fib off coumadin, can not exclude acute brainstem infarct (Cr>4 precludes CTA). STAT CT brain, will make further decisions accordingly. Unfortunately he can not have MRI due to  pacemaker. Will follow up.   Dorian Pod, MD [facility] [phone]  05/15/2015, 7:57 AM

## 2015-05-15 NOTE — Consult Note (Signed)
PULMONARY / CRITICAL CARE MEDICINE   Name: BRANNDON TUITE MRN: [id number] DOB: Jun 02, 1934    ADMISSION DATE:  05/11/2015 CONSULTATION DATE:  05/15/2015  REFERRING MD :  Tana Coast  CHIEF COMPLAINT:  Acute confusion  INITIAL PRESENTATION: 81 y/o male admitted on 5/18 to [facility] with pancytopenia and dyspnea.  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  STUDIES:  5/19 CT head >> 5/18 Echo> LVEF 20-25%, inferior WMA's, mild AI, mild MR, LAE, RAE  SIGNIFICANT EVENTS: 5/19 early AM became unresponsive, required intubation   HISTORY OF PRESENT ILLNESS:  81 y/o male with CHF and myastenia and CKD being treated with imuran as an outpatient was admitted on 5/18 from the [facility] ED with dyspnea.  He was found to be pancytopenic.  The presumptive diagnosis on admission was CHF and symptomatic anemia.  ON 5/19 AM he became acutely unresponsive with a glucose of 38.  He was given an amp of D50 but his mental status did not improve so he was intubated in the ICU.  No further history could be obtained by me due to altered mental status.  PAST MEDICAL HISTORY :   has a past medical history of Adenocarcinoma of rectum (2008); Pure hypercholesterolemia; Diverticulosis of colon (without mention of hemorrhage); Hypertension; Coronary artery disease; CHF (congestive heart failure); H/O blood clots (1993); Arthritis; Skin cancer; GERD (gastroesophageal reflux disease); Enlarged prostate; Insomnia; CKD (chronic kidney disease) stage 4, GFR 15-29 ml/min; Permanent atrial fibrillation; PVC (premature ventricular contraction); Ischemic cardiomyopathy; HLD (hyperlipidemia); and Blood transfusion without reported diagnosis.  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 Prior to Admission medications   Medication Sig Start Date End Date Taking? Authorizing Provider  azaTHIOprine (IMURAN) 50 MG tablet One tab po bid xone week, then 2 tabs po bid 04/11/15  Yes Marcial Pacas, MD  feeding supplement, ENSURE COMPLETE, (ENSURE COMPLETE) LIQD Take 237 mLs by mouth daily. 10/05/14  Yes Barton Dubois, MD  ferrous sulfate 325 (65 FE) MG tablet Take 325 mg by mouth 2 (two) times daily with a meal.    Yes Ladell Pier, MD  finasteride (PROSCAR) 5 MG tablet Take 5 mg by mouth daily.     Yes Historical Provider, MD  furosemide (LASIX) 40 MG tablet Take 1 tablet (40 mg total) by mouth daily. 10/05/14  Yes Barton Dubois, MD  hydrALAZINE (APRESOLINE) 25 MG tablet Take 1 tablet (25 mg total) by mouth 2 (two) times daily. 12/12/14  Yes Darlin Coco, MD  isosorbide dinitrate (ISORDIL) 20 MG tablet Take 10 mg by mouth 2 (two) times daily. 01/09/15  Yes Historical Provider, MD  metoprolol succinate (TOPROL-XL) 50 MG 24 hr tablet Take 1 tablet (50 mg total) by mouth 2 (two) times daily. 05/12/15  Yes Amber Vertis Kelch, NP  nitroGLYCERIN (NITROSTAT) 0.4 MG SL tablet Place 1 tablet (0.4 mg total) under the tongue every 5 (five) minutes as needed. For chest pain 02/13/13  Yes Darlin Coco, MD  predniSONE (DELTASONE) 10 MG tablet 2 tabs po qam x 2 weeks, then one tab po qam 04/11/15  Yes Marcial Pacas, MD  pyridostigmine (MESTINON) 60 MG tablet 1/2 to one tab po tid 04/11/15  Yes Marcial Pacas, MD  simvastatin (ZOCOR) 20 MG tablet Take 1 tablet (20 mg total) by mouth at bedtime. 07/11/14  Yes [name]  Brackbill, MD  tamsulosin (FLOMAX) 0.4 MG CAPS capsule Take 1 capsule (0.4 mg total) by mouth daily after supper. 10/05/14  Yes Barton Dubois, MD  vitamin C (ASCORBIC ACID) 500 MG tablet Take 500 mg by mouth daily.     Yes Historical Provider, MD  Vitamins-Lipotropics (B-100 COMPLEX) TABS Take 1 tablet by mouth daily.    Yes Historical Provider, MD  warfarin  (COUMADIN) 3 MG tablet TAKE AS DIRECTED PER COUMADIN CLINIC Patient taking differently: Take 1 1/2 tablet everyday except Thursday and Saturday takes one tablet 03/10/15  Yes Darlin Coco, MD   Allergies  Allergen Reactions  . Cephalexin Other (See Comments)    "too strong for my system" - per office note 05/05/2012  . Ciprofloxacin Other (See Comments)    Reaction unknown  . Hctz [Hydrochlorothiazide]     DIZZINESS   . Levofloxacin Other (See Comments)    "too strong for my system" - per office note 05/05/2012  . Lipitor [Atorvastatin Calcium]     MUSCLE PAIN  . Niaspan [Niacin Er]     FLUSHING   . Oxycodone-Acetaminophen Other (See Comments)    Reaction unknown  . Sulfonamide Derivatives Other (See Comments)    Reaction unkonwn    FAMILY HISTORY:  indicated that his mother is deceased. He indicated that his father is deceased.  SOCIAL HISTORY:  reports that he quit smoking about 23 years ago. His smoking use included Pipe and Cigars. His smokeless tobacco use includes Chew. He reports that he does not drink alcohol or use illicit drugs.  REVIEW OF SYSTEMS:  Cannot obtain due to confusion  SUBJECTIVE:   VITAL SIGNS: Temp:  [97.9 F (36.6 C)-100.9 F (38.3 C)] 98 F (36.7 C) (05/19 0414) Pulse Rate:  [36-123] 68 (05/19 0700) Resp:  [15-28] 25 (05/19 0700) BP: (82-187)/(30-168) 145/83 mmHg (05/19 0700) SpO2:  [94 %-100 %] 100 % (05/19 0700) Weight:  [70.4 kg (155 lb 3.3 oz)] 70.4 kg (155 lb 3.3 oz) (05/19 0500) HEMODYNAMICS:   VENTILATOR SETTINGS:   INTAKE / OUTPUT:  Intake/Output Summary (Last 24 hours) at 05/15/15 0803 Last data filed at 05/15/15 0500  Gross per 24 hour  Intake 1238.66 ml  Output    100 ml  Net 1138.66 ml    PHYSICAL EXAMINATION: General:  Thin male, confused Neuro:  Not awake or interactive, occasionally moves all four ext and reaches for bag mask HEENT:  NCAT, OP clear Cardiovascular:  Irregular, tachy Lungs:  Diminished  bilaterally GI: BS+, soft, non tender Musculoskeletal:  Decreased bulk Skin:  No rash or skin breakdown  LABS:  CBC  Recent Labs Lab 05/14/15 1647 05/14/15 1858 05/15/15 0245  WBC 0.1* 0.2* 0.3*  HGB 8.6* 8.9* 9.9*  HCT 25.5* 26.4* 30.3*  PLT 36* 56* 38*   Coag's  Recent Labs Lab 05/08/15 1226 05/12/2015 2348  INR 1.3 1.64*   BMET  Recent Labs Lab 05/06/2015 2222 05/15/15 0245  NA 142 138  K 3.6 5.1  CL 100* 95*  CO2 29 24  BUN 40* 54*  CREATININE 2.82* 4.10*  GLUCOSE 97 82   Electrolytes  Recent Labs Lab 05/23/2015 2222 05/15/15 0245  CALCIUM 9.9 9.8   Sepsis Markers No results for input(s): LATICACIDVEN, PROCALCITON, O2SATVEN in the last 168 hours. ABG No results for input(s): PHART, PCO2ART, PO2ART in the last 168 hours. Liver Enzymes No results for input(s): AST, ALT, ALKPHOS, BILITOT, ALBUMIN in the last 168 hours. Cardiac Enzymes  Recent Labs Lab 05/14/15 [phone] 05/14/15  1647  TROPONINI 0.40* 0.84*   Glucose  Recent Labs Lab 05/14/15 0523  GLUCAP 76    Imaging  5/19 CXR > ETT elevated, bibasilar atelectasis and effusions, cardiomegally   ASSESSMENT / PLAN:  NEUROLOGIC A:  Acute encephalopathy> ddx includes hypoglycemia, acute hemorrhagic stroke in setting of thrombocytopenia, less likely seizure Myasthenia, does not appear to be flaring right now P:   RASS goal: -1 STAT CT head Neurology consult Intermittent fentanyl per PAD protocol Hold meds that would exacerbate myasthenia Continue mestinon  PULMONARY OETT 5/19 >> A:Acute respiratory failure with hypoxemia due to CHF exacerbation, DDX includes HCAP, was febrile yesterday P:   Intubate now Full vent support ABG post intubation Daily CXR See ID  CARDIOVASCULAR CVL 5/19 >> A: Acute decompensated CHF, LVEF 20-25% which is new with new WMA's, ddx includes ischemia but EKG non-specific on admission Afib Ectopy 5/19 Hypotension 5/19 post intubation> cardiogenic? P:   Add Mg to AM labs 12 lead now Cycle troponin Cardiology consult Levophed for MAP > 65 Hold BP meds now given low BP Place CVL CVP Check Coox Check lactic acid  RENAL A:  Acute on chronic renal insufficiency> suspect due to lack of forward flow P:   Check CVP now Hold diuretics for now Start inotrope as above Monitor BMET and UOP Replace electrolytes as needed  GASTROINTESTINAL A:  No acute issues P:   OG tube pepcid for SUP Check LFT  HEMATOLOGIC A:  Acute pancytopenia on admission, most likely due to Imuran in setting of renal failure Neutropenia P:  No imuran Monitor for bleeding Daily CBC  INFECTIOUS A:  Neutropenic fever with infiltrates, could be HCAP but favor pulm edema as cause of CXR abnormalities P:   BCx2 5/19 >> Sputum 5/19 >>  Start Cefepime 5/19 >> Monitor culture closely, hold vanc due to myasthenia  ENDOCRINE A:  Hypoglycemia> corrected P:   Monitor glucose closely Continue D5   FAMILY  - Updates: Awaiting wife arrival 5/19 AM  My CC time 60 minutes independent of procedures  Roselie Awkward, MD [place] [facility] Pager: [phone] Cell: [phone] If no response, call [phone]   05/15/2015, 8:03 AM

## 2015-05-15 NOTE — Consult Note (Signed)
Nicholas [name] is an 81 y.o. male with multiple medical problems including hypertension, hyperlipidemia, GERD, atrial fibrillation on Coumadin, bi-ventricular pacemaker placement, history of rectal cancer (s/p of proctocolectomy, s/p of colostomy 2008), coronary artery disease (s/p of CABG), chronic systolic congestive heart failure (EF of 40-45%), BPH, chronic kidney disease-stage IV, and MG treated with Imuran + prednisone+ mestinon, admitted on 5/18 with respiratory insufficiency(EF down to 20-25%), pancytopenia with severe thrombocytopenia (38) requiring platelets transfusion. This morning patient noted to be unresponsive, hypotensive, hypoglycemic and intubated for airway protection.  The pt has known CKD with baseline creat about 3.$RemoveBefor'0mg'kSrPiVlliRAj$ /dl.  He is followed by [name]. Since admission creatinine has risen from 2.82 to 4.63, with associated lactic acidosis and oliguria. Renal is asked to assist with management.   Past Medical History  Diagnosis Date  . 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 Pure hypercholesterolemia     takes Vytorin daily  . Diverticulosis of colon (without mention of hemorrhage)   . Hypertension     takes Amlodipine,Metoprolol,and Cardura daily  . Coronary artery disease     a. s/p CABG  . CHF (congestive heart failure)     a. s/p STJ CRTD  . H/O blood clots 1993  . Arthritis   . Skin cancer     nose  . GERD (gastroesophageal reflux disease)        . Enlarged prostate        . Insomnia   . CKD (chronic kidney disease) stage 4, GFR 15-29 ml/min   . Permanent atrial fibrillation   . PVC (premature ventricular contraction)   . Ischemic cardiomyopathy   . HLD (hyperlipidemia)   . Blood transfusion without reported diagnosis    Past Surgical History  Procedure Laterality Date  . Shoulder surgery  2007    left  . Cataract extraction      right  . Abdominoperineal proctocolectomy  06/01/2007  . Coronary angioplasty  05/22/92  . Colostomy  2008  . Colonoscopy     . Coronary artery bypass graft  1993  . Colon surgery  2008    colectomy  . Total knee arthroplasty  02/28/2012    Procedure: TOTAL KNEE ARTHROPLASTY;  Surgeon: Rudean Haskell, MD;  Location: [facility];  Service: Orthopedics;  Laterality: Right;  . Bi-ventricular pacemaker insertion N/A 10/01/2014    SJM Allure Quadra implnated by Dr Rayann Heman for symptomatic bradycadria   Social History:  reports that he quit smoking about 23 years ago. His smoking use included Pipe and Cigars. His smokeless tobacco use includes Chew. He reports that he does not drink alcohol or use illicit drugs. Allergies:  Allergies  Allergen Reactions  . Cephalexin Other (See Comments)    "too strong for my system" - per office note 05/05/2012  . Ciprofloxacin Other (See Comments)    Reaction unknown  . Hctz [Hydrochlorothiazide]     DIZZINESS   . Levofloxacin Other (See Comments)    "too strong for my system" - per office note 05/05/2012  . Lipitor [Atorvastatin Calcium]     MUSCLE PAIN  . Niaspan [Niacin Er]     FLUSHING   . Oxycodone-Acetaminophen Other (See Comments)    Reaction unknown  . Sulfonamide Derivatives Other (See Comments)    Reaction unkonwn   Family History  Problem Relation Age of Onset  . Pancreatic cancer Father   . Diabetes Son   . Emphysema Mother   . Anesthesia problems Neg Hx   .  Hypotension Neg Hx   . Malignant hyperthermia Neg Hx   . Pseudochol deficiency Neg Hx     Medications:  Scheduled: . sodium chloride   Intravenous Once  . antiseptic oral rinse  7 mL Mouth Rinse BID  . B-complex with vitamin C   Oral Daily  . ceFEPime (MAXIPIME) IV  2 g Intravenous Q24H  . famotidine (PEPCID) IV  20 mg Intravenous Q24H  . ferrous sulfate  325 mg Oral BID WC  . hydrocortisone sodium succinate  50 mg Intravenous Q6H  . pyridostigmine  30 mg Per Tube 3 times per day  . simvastatin  20 mg Oral QHS  . sodium chloride  1,000 mL Intravenous Once  . sodium chloride  3 mL Intravenous Q12H  .  Tbo-Filgrastim  480 mcg Subcutaneous Daily    ROS: Not able to obtain due to VDRF  Blood pressure 82/36, pulse 46, temperature 97.8 F (36.6 C), temperature source Oral, resp. rate 20, height $RemoveBe'5\' 10"'UtxOZobNj$  (1.778 m), weight 70.4 kg (155 lb 3.3 oz), SpO2 95 %.  General appearance: emaciated appearing Head: Normocephalic, without obvious abnormality, atraumatic, temporal wasting Eyes: NE Throat: lips, mucosa, and tongue normal; teeth and gums normal Resp: clear to auscultation bilaterally Chest wall: no tenderness Cardio: regular rate and rhythm, S1, S2 normal, no murmur, click, rub or gallop GI: soft, non-tender; bowel sounds normal; no masses,  no organomegaly Extremities: edema tr edema Skin: Skin color, texture, turgor normal. No rashes or lesions Neurologic: not responsive  Results for orders placed or performed during the hospital encounter of 05/05/2015 (from the past 48 hour(s))  Basic metabolic panel     Status: Abnormal   Collection Time: 04/30/2015 10:22 PM  Result Value Ref Range   Sodium 142 135 - 145 mmol/L   Potassium 3.6 3.5 - 5.1 mmol/L   Chloride 100 (L) 101 - 111 mmol/L   CO2 29 22 - 32 mmol/L   Glucose, Bld 97 65 - 99 mg/dL   BUN 40 (H) 6 - 20 mg/dL   Creatinine, Ser 2.82 (H) 0.61 - 1.24 mg/dL   Calcium 9.9 8.9 - 10.3 mg/dL   GFR calc non Af Amer 20 (L) >60 mL/min   GFR calc Af Amer 23 (L) >60 mL/min    Comment: (NOTE) The eGFR has been calculated using the CKD EPI equation. This calculation has not been validated in all clinical situations. eGFR's persistently <60 mL/min signify possible Chronic Kidney Disease.    Anion gap 13 5 - 15  Brain natriuretic peptide     Status: Abnormal   Collection Time: 05/24/2015 10:22 PM  Result Value Ref Range   B Natriuretic Peptide 1324.5 (H) 0.0 - 100.0 pg/mL  I-stat troponin, ED     Status: None   Collection Time: 04/29/2015 10:35 PM  Result Value Ref Range   Troponin i, poc 0.06 0.00 - 0.08 ng/mL   Comment 3             Comment: Due to the release kinetics of cTnI, a negative result within the first hours of the onset of symptoms does not rule out myocardial infarction with certainty. If myocardial infarction is still suspected, repeat the test at appropriate intervals.   CBC with Differential     Status: Abnormal   Collection Time: 05/03/2015 11:48 PM  Result Value Ref Range   WBC 0.1 (LL) 4.0 - 10.5 K/uL    Comment: REPEATED TO VERIFY RESULTS VERIFIED VIA RECOLLECT WHITE COUNT CONFIRMED ON SMEAR  CRITICAL RESULT CALLED TO, READ BACK BY AND VERIFIED WITH: JPascal Lux RN [phone] GREEN R    RBC 2.04 (L) 4.22 - 5.81 MIL/uL   Hemoglobin 6.7 (LL) 13.0 - 17.0 g/dL    Comment: REPEATED TO VERIFY RESULTS VERIFIED VIA RECOLLECT CRITICAL RESULT CALLED TO, READ BACK BY AND VERIFIED WITH: Owens Shark RN [phone] GREEN R    HCT 20.8 (L) 39.0 - 52.0 %   MCV 102.0 (H) 78.0 - 100.0 fL   MCH 32.8 26.0 - 34.0 pg   MCHC 32.2 30.0 - 36.0 g/dL   RDW 14.1 11.5 - 15.5 %   Platelets 8 (LL) 150 - 400 K/uL    Comment: RESULTS VERIFIED VIA RECOLLECT REPEATED TO VERIFY CRITICAL RESULT CALLED TO, READ BACK BY AND VERIFIED WITH: Owens Shark RN [phone] GREEN R PLATELET COUNT CONFIRMED BY SMEAR    Neutrophils Relative % 23 (L) 43 - 77 %   Lymphocytes Relative 69 (H) 12 - 46 %   Monocytes Relative 8 3 - 12 %   Eosinophils Relative 0 0 - 5 %   Basophils Relative 0 0 - 1 %   Neutro Abs 0.0 (L) 1.7 - 7.7 K/uL   Lymphs Abs 0.1 (L) 0.7 - 4.0 K/uL   Monocytes Absolute 0.0 (L) 0.1 - 1.0 K/uL   Eosinophils Absolute 0.0 0.0 - 0.7 K/uL   Basophils Absolute 0.0 0.0 - 0.1 K/uL   RBC Morphology ELLIPTOCYTES     Comment: TARGET CELLS  Protime-INR     Status: Abnormal   Collection Time: 05/24/2015 11:48 PM  Result Value Ref Range   Prothrombin Time 19.5 (H) 11.6 - 15.2 seconds   INR 1.64 (H) 0.00 - 1.49  Pathologist smear review     Status: None   Collection Time: 04/28/2015 11:48 PM  Result Value Ref Range   Path Review  PANCYTOPENIA INCLUDING     Comment: MARKED NEUTROPENIA. Reviewed by Chrystie Nose. Saralyn Pilar, M.D. 05/14/15.   Prepare RBC     Status: None   Collection Time: 05/14/15  1:00 AM  Result Value Ref Range   Order Confirmation ORDER PROCESSED BY BLOOD BANK   Type and screen     Status: None   Collection Time: 05/14/15  1:19 AM  Result Value Ref Range   ABO/RH(D) A POS    Antibody Screen NEG    Sample Expiration 05/17/2015    Unit Number [id number]    Blood Component Type RED CELLS,LR    Unit division 00    Status of Unit ISSUED,FINAL    Transfusion Status OK TO TRANSFUSE    Crossmatch Result COMPATIBLE    Donor AG Type NEGATIVE FOR E ANTIGEN    Unit Number [id number]    Blood Component Type RED CELLS,LR    Unit division 00    Status of Unit ISSUED,FINAL    Transfusion Status OK TO TRANSFUSE    Crossmatch Result COMPATIBLE    Donor AG Type NEGATIVE FOR E ANTIGEN   CBC     Status: Abnormal   Collection Time: 05/14/15  1:19 AM  Result Value Ref Range   WBC 0.2 (LL) 4.0 - 10.5 K/uL    Comment: REPEATED TO VERIFY CRITICAL VALUE NOTED.  VALUE IS CONSISTENT WITH PREVIOUSLY REPORTED AND CALLED VALUE.    RBC 2.12 (L) 4.22 - 5.81 MIL/uL   Hemoglobin 6.9 (LL) 13.0 - 17.0 g/dL    Comment: REPEATED TO VERIFY CRITICAL VALUE NOTED.  VALUE IS CONSISTENT WITH PREVIOUSLY  REPORTED AND CALLED VALUE.    HCT 21.6 (L) 39.0 - 52.0 %   MCV 101.9 (H) 78.0 - 100.0 fL   MCH 32.5 26.0 - 34.0 pg   MCHC 31.9 30.0 - 36.0 g/dL   RDW 14.3 11.5 - 15.5 %   Platelets 8 (LL) 150 - 400 K/uL    Comment: REPEATED TO VERIFY CRITICAL VALUE NOTED.  VALUE IS CONSISTENT WITH PREVIOUSLY REPORTED AND CALLED VALUE. PLATELET COUNT CONFIRMED BY SMEAR   Urinalysis, Routine w reflex microscopic     Status: Abnormal   Collection Time: 05/14/15  4:25 AM  Result Value Ref Range   Color, Urine AMBER (A) YELLOW    Comment: BIOCHEMICALS MAY BE AFFECTED BY COLOR   APPearance CLOUDY (A) CLEAR   Specific Gravity, Urine 1.011  1.005 - 1.030   pH 5.0 5.0 - 8.0   Glucose, UA NEGATIVE NEGATIVE mg/dL   Hgb urine dipstick SMALL (A) NEGATIVE   Bilirubin Urine NEGATIVE NEGATIVE   Ketones, ur NEGATIVE NEGATIVE mg/dL   Protein, ur 30 (A) NEGATIVE mg/dL   Urobilinogen, UA 1.0 0.0 - 1.0 mg/dL   Nitrite NEGATIVE NEGATIVE   Leukocytes, UA NEGATIVE NEGATIVE  Urine microscopic-add on     Status: Abnormal   Collection Time: 05/14/15  4:25 AM  Result Value Ref Range   Squamous Epithelial / LPF FEW (A) RARE   WBC, UA 0-2 <3 WBC/hpf   RBC / HPF 11-20 <3 RBC/hpf   Bacteria, UA FEW (A) RARE   Casts HYALINE CASTS (A) NEGATIVE   Urine-Other MUCOUS PRESENT   Glucose, capillary     Status: None   Collection Time: 05/14/15  5:23 AM  Result Value Ref Range   Glucose-Capillary 76 65 - 99 mg/dL  MRSA PCR Screening     Status: None   Collection Time: 05/14/15  6:48 AM  Result Value Ref Range   MRSA by PCR NEGATIVE NEGATIVE    Comment:        The GeneXpert MRSA Assay (FDA approved for NASAL specimens only), is one component of a comprehensive MRSA colonization surveillance program. It is not intended to diagnose MRSA infection nor to guide or monitor treatment for MRSA infections.   Prepare Pheresed Platelets     Status: None   Collection Time: 05/14/15  7:30 AM  Result Value Ref Range   Unit Number [id number]    Blood Component Type PLTP LR1 PAS    Unit division 00    Status of Unit ISSUED,FINAL    Transfusion Status OK TO TRANSFUSE    Unit Number [id number]    Blood Component Type PLTPHER LR2    Unit division 00    Status of Unit ISSUED,FINAL    Transfusion Status OK TO TRANSFUSE   Haptoglobin     Status: Abnormal   Collection Time: 05/14/15  8:43 AM  Result Value Ref Range   Haptoglobin 252 (H) 34 - 200 mg/dL    Comment: (NOTE) Performed At: [facility] [place], [place] [id number] Lindon Romp MD [id number]   Lactate dehydrogenase     Status: None   Collection Time:  05/14/15  8:43 AM  Result Value Ref Range   LDH 145 98 - 192 U/L  Troponin I (q 6hr x 3)     Status: Abnormal   Collection Time: 05/14/15  8:43 AM  Result Value Ref Range   Troponin I 0.40 (H) <0.031 ng/mL    Comment:  PERSISTENTLY INCREASED TROPONIN VALUES IN THE RANGE OF 0.04-0.49 ng/mL CAN BE SEEN IN:       -UNSTABLE ANGINA       -CONGESTIVE HEART FAILURE       -MYOCARDITIS       -CHEST TRAUMA       -ARRYHTHMIAS       -LATE PRESENTING MYOCARDIAL INFARCTION       -COPD   CLINICAL FOLLOW-UP RECOMMENDED.   Troponin I (q 6hr x 3)     Status: Abnormal   Collection Time: 05/14/15  4:47 PM  Result Value Ref Range   Troponin I 0.84 (HH) <0.031 ng/mL    Comment:        POSSIBLE MYOCARDIAL ISCHEMIA. SERIAL TESTING RECOMMENDED. REPEATED TO VERIFY CRITICAL RESULT CALLED TO, READ BACK BY AND VERIFIED WITH: T HARVEY,RN 05.18.16 1802 M SHIPMAN   CBC     Status: Abnormal   Collection Time: 05/14/15  4:47 PM  Result Value Ref Range   WBC 0.1 (LL) 4.0 - 10.5 K/uL    Comment: REPEATED TO VERIFY SPECIMEN CHECKED FOR CLOTS CRITICAL VALUE NOTED.  VALUE IS CONSISTENT WITH PREVIOUSLY REPORTED AND CALLED VALUE.    RBC 2.67 (L) 4.22 - 5.81 MIL/uL   Hemoglobin 8.6 (L) 13.0 - 17.0 g/dL   HCT 25.5 (L) 39.0 - 52.0 %   MCV 95.5 78.0 - 100.0 fL   MCH 32.2 26.0 - 34.0 pg   MCHC 33.7 30.0 - 36.0 g/dL   RDW 16.7 (H) 11.5 - 15.5 %   Platelets 36 (L) 150 - 400 K/uL    Comment: REPEATED TO VERIFY SPECIMEN CHECKED FOR CLOTS CONSISTENT WITH PREVIOUS RESULT   Save smear     Status: None   Collection Time: 05/14/15  4:47 PM  Result Value Ref Range   Smear Review SMEAR STAINED AND AVAILABLE FOR REVIEW   CBC     Status: Abnormal   Collection Time: 05/14/15  6:58 PM  Result Value Ref Range   WBC 0.2 (LL) 4.0 - 10.5 K/uL    Comment: REPEATED TO VERIFY CRITICAL VALUE NOTED.  VALUE IS CONSISTENT WITH PREVIOUSLY REPORTED AND CALLED VALUE.    RBC 2.74 (L) 4.22 - 5.81 MIL/uL   Hemoglobin 8.9  (L) 13.0 - 17.0 g/dL   HCT 26.4 (L) 39.0 - 52.0 %   MCV 96.4 78.0 - 100.0 fL   MCH 32.5 26.0 - 34.0 pg   MCHC 33.7 30.0 - 36.0 g/dL   RDW 16.9 (H) 11.5 - 15.5 %   Platelets 56 (L) 150 - 400 K/uL    Comment: REPEATED TO VERIFY CONSISTENT WITH PREVIOUS RESULT   CBC     Status: Abnormal   Collection Time: 05/15/15  2:45 AM  Result Value Ref Range   WBC 0.3 (LL) 4.0 - 10.5 K/uL    Comment: REPEATED TO VERIFY CRITICAL VALUE NOTED.  VALUE IS CONSISTENT WITH PREVIOUSLY REPORTED AND CALLED VALUE.    RBC 3.14 (L) 4.22 - 5.81 MIL/uL   Hemoglobin 9.9 (L) 13.0 - 17.0 g/dL   HCT 30.3 (L) 39.0 - 52.0 %   MCV 96.5 78.0 - 100.0 fL   MCH 31.5 26.0 - 34.0 pg   MCHC 32.7 30.0 - 36.0 g/dL   RDW 16.7 (H) 11.5 - 15.5 %   Platelets 38 (L) 150 - 400 K/uL    Comment: REPEATED TO VERIFY CONSISTENT WITH PREVIOUS RESULT   Basic metabolic panel     Status: Abnormal   Collection Time: 05/15/15  2:45 AM  Result Value Ref Range   Sodium 138 135 - 145 mmol/L   Potassium 5.1 3.5 - 5.1 mmol/L    Comment: DELTA CHECK NOTED   Chloride 95 (L) 101 - 111 mmol/L   CO2 24 22 - 32 mmol/L   Glucose, Bld 82 65 - 99 mg/dL   BUN 54 (H) 6 - 20 mg/dL   Creatinine, Ser 4.73 (H) 0.61 - 1.24 mg/dL    Comment: DELTA CHECK NOTED   Calcium 9.8 8.9 - 10.3 mg/dL   GFR calc non Af Amer 13 (L) >60 mL/min   GFR calc Af Amer 15 (L) >60 mL/min    Comment: (NOTE) The eGFR has been calculated using the CKD EPI equation. This calculation has not been validated in all clinical situations. eGFR's persistently <60 mL/min signify possible Chronic Kidney Disease.    Anion gap 19 (H) 5 - 15  Reticulocytes     Status: Abnormal   Collection Time: 05/15/15  2:45 AM  Result Value Ref Range   Retic Ct Pct <0.4 (L) 0.4 - 3.1 %    Comment: CORRECTED REPORT CALLED TO Sung Amabile [phone] WILDERK CORRECTED ON 05/19 AT 0326: PREVIOUSLY REPORTED AS 0.2    RBC. 3.16 (L) 4.22 - 5.81 MIL/uL   Retic Count, Manual 6.3 (L) 19.0 - 186.0 K/uL   Ferritin     Status: Abnormal   Collection Time: 05/15/15  2:45 AM  Result Value Ref Range   Ferritin 2277 (H) 24 - 336 ng/mL  Iron and TIBC     Status: Abnormal   Collection Time: 05/15/15  2:45 AM  Result Value Ref Range   Iron 105 45 - 182 ug/dL   TIBC 050 (L) 399 - 713 ug/dL   Saturation Ratios 63 (H) 17.9 - 39.5 %   UIBC 63 ug/dL  Vitamin N34     Status: None   Collection Time: 05/15/15  2:45 AM  Result Value Ref Range   Vitamin B-12 385 180 - 914 pg/mL    Comment: (NOTE) This assay is not validated for testing neonatal or myeloproliferative syndrome specimens for Vitamin B12 levels.   Basic metabolic panel     Status: Abnormal   Collection Time: 05/15/15  9:50 AM  Result Value Ref Range   Sodium 140 135 - 145 mmol/L   Potassium 4.0 3.5 - 5.1 mmol/L   Chloride 101 101 - 111 mmol/L   CO2 19 (L) 22 - 32 mmol/L   Glucose, Bld 131 (H) 65 - 99 mg/dL   BUN 62 (H) 6 - 20 mg/dL   Creatinine, Ser 5.63 (H) 0.61 - 1.24 mg/dL   Calcium 8.6 (L) 8.9 - 10.3 mg/dL   GFR calc non Af Amer 11 (L) >60 mL/min   GFR calc Af Amer 13 (L) >60 mL/min    Comment: (NOTE) The eGFR has been calculated using the CKD EPI equation. This calculation has not been validated in all clinical situations. eGFR's persistently <60 mL/min signify possible Chronic Kidney Disease.    Anion gap 20 (H) 5 - 15  Draw ABG 1 hour after initiation of ventilator     Status: Abnormal   Collection Time: 05/15/15 10:50 AM  Result Value Ref Range   FIO2 1.00 %   Delivery systems VENTILATOR    Mode PRESSURE REGULATED VOLUME CONTROL    VT 580 mL   Rate 20 resp/min   Peep/cpap 5.0 cm H20   pH, Arterial 7.305 (L) 7.350 - 7.450  pCO2 arterial 30.0 (L) 35.0 - 45.0 mmHg   pO2, Arterial 444 (H) 80.0 - 100.0 mmHg   Bicarbonate 14.6 (L) 20.0 - 24.0 mEq/L   TCO2 15.5 0 - 100 mmol/L   Acid-base deficit 10.6 (H) 0.0 - 2.0 mmol/L   O2 Saturation 99.6 %   Patient temperature 97.9    Collection site RIGHT RADIAL    Drawn by  [phone]    Sample type ARTERIAL DRAW    Allens test (pass/fail) PASS PASS  Lactic acid, plasma     Status: Abnormal   Collection Time: 05/15/15 12:15 PM  Result Value Ref Range   Lactic Acid, Venous 8.5 (HH) 0.5 - 2.0 mmol/L    Comment: CRITICAL RESULT CALLED TO, READ BACK BY AND VERIFIED WITH: HARVEY T RN 05/15/15 1318 COSTELLO B REPEATED TO VERIFY   Magnesium     Status: None   Collection Time: 05/15/15 12:15 PM  Result Value Ref Range   Magnesium 1.9 1.7 - 2.4 mg/dL  Protime-INR     Status: Abnormal   Collection Time: 05/15/15 12:15 PM  Result Value Ref Range   Prothrombin Time 24.8 (H) 11.6 - 15.2 seconds   INR 2.27 (H) 0.00 - 1.49  Hepatic function panel     Status: Abnormal   Collection Time: 05/15/15 12:15 PM  Result Value Ref Range   Total Protein 5.2 (L) 6.5 - 8.1 g/dL   Albumin 2.3 (L) 3.5 - 5.0 g/dL   AST 155 (H) 15 - 41 U/L   ALT 81 (H) 17 - 63 U/L   Alkaline Phosphatase 30 (L) 38 - 126 U/L   Total Bilirubin 5.6 (H) 0.3 - 1.2 mg/dL   Bilirubin, Direct 3.9 (H) 0.1 - 0.5 mg/dL   Indirect Bilirubin 1.7 (H) 0.3 - 0.9 mg/dL  Phosphorus     Status: Abnormal   Collection Time: 05/15/15 12:15 PM  Result Value Ref Range   Phosphorus 6.9 (H) 2.5 - 4.6 mg/dL  Carboxyhemoglobin     Status: Abnormal   Collection Time: 05/15/15 12:40 PM  Result Value Ref Range   Total hemoglobin 10.9 (L) 13.5 - 18.0 g/dL   O2 Saturation 56.1 %   Carboxyhemoglobin 0.7 0.5 - 1.5 %   Methemoglobin 1.2 0.0 - 1.5 %   Dg Chest 2 View  04/30/2015   CLINICAL DATA:  Shortness of breath for 1 day.  EXAM: CHEST  2 VIEW  COMPARISON:  Chest CT 04/16/2015  FINDINGS: Patient is post median sternotomy and CABG. Dual lead left-sided pacemaker remains in place. Mild cardiomegaly and tortuosity of the thoracic aorta. No pulmonary edema. The lungs are hyperinflated. Minimal blunting of the right costophrenic angle. No confluent airspace disease. No pneumothorax. Left proximal humerus prosthesis in place. No acute  osseous abnormalities are seen.  IMPRESSION: 1.  No acute pulmonary process. 2. Hyperinflation. Minimal blunting of right costophrenic angle, may be related to hyperinflation versus small pleural effusion.   Electronically Signed   By: Jeb Levering M.D.   On: 05/15/2015 22:48   Ct Head Wo Contrast  05/15/2015   CLINICAL DATA:  Decreased platelet count, possible CVA or brain bleed  EXAM: CT HEAD WITHOUT CONTRAST  TECHNIQUE: Contiguous axial images were obtained from the base of the skull through the vertex without intravenous contrast.  COMPARISON:  09/27/2014  FINDINGS: No skull fracture is noted. Stable postsurgical changes right maxillary sinus. The mastoid air cells are unremarkable.  No intracranial hemorrhage, mass effect or midline shift. Stable cerebral atrophy. Stable  periventricular and patchy subcortical chronic white matter disease. Stable cerebellar atrophy. No acute cortical infarction. No mass lesion is noted on this unenhanced scan.  IMPRESSION: No acute intracranial abnormality. Stable atrophy and chronic white matter disease.   Electronically Signed   By: Lahoma Crocker M.D.   On: 05/15/2015 10:34   Dg Chest Port 1 View  05/15/2015   CLINICAL DATA:  Acute respiratory failure with hypoxia. Tube repositioned. Check placement.  EXAM: PORTABLE CHEST - 1 VIEW  COMPARISON:  05/15/2015  FINDINGS: Endotracheal tube is in place, 4.3 cm above carina. Right IJ central line tip overlies the superior vena cava. Nasogastric tube tip is off the film but beyond the gastroesophageal junction. Left-sided transvenous pacemaker leads overlying the right ventricle and coronary sinus.  Heart is mildly enlarged. There is minimal right lower lobe opacity, improved possibly related to positioning. Suspect right pleural scratched a suspect bilateral pleural effusions. There is pulmonary vascular congestion.  IMPRESSION: 1. Repositioned endotracheal tube. 2. Bilateral pleural effusions.   Electronically Signed   By:  Nolon Nations M.D.   On: 05/15/2015 13:11   Dg Chest Port 1 View  05/15/2015   CLINICAL DATA:  Central line placement.  EXAM: PORTABLE CHEST - 1 VIEW  COMPARISON:  Same day.  FINDINGS: Stable cardiomediastinal silhouette. Stable bibasilar opacities are noted concerning for atelectasis or edema with mild associated pleural effusions. No definite pneumothorax is noted. Left-sided pacemaker is unchanged in position. Status post coronary artery bypass graft. Interval placement of right internal jugular catheter line with distal tip overlying expected position of the SVC. Endotracheal tube has advanced with distal tip less than 1 cm above the carina. Nasogastric tube tip is seen entering stomach.  IMPRESSION: Interval placement of right internal jugular catheter with distal tip overlying expected position of the SVC. No pneumothorax is noted. Stable bibasilar opacities are noted concerning for atelectasis or edema with associated mild pleural effusions.  Endotracheal tube is now seen with distal tip less than 1 cm above the carina. Withdrawal by 2-3 cm is recommended. Critical Value/emergent results were called by telephone at the time of interpretation on 05/15/2015 at 9:15 am to Rosine Door, the patient's nurse, who verbally acknowledged these results.   Electronically Signed   By: Marijo Conception, M.D.   On: 05/15/2015 09:15   Dg Chest Port 1 View  05/15/2015   CLINICAL DATA:  Status post endotracheal tube and nasogastric catheter placement  EXAM: PORTABLE CHEST - 1 VIEW  COMPARISON:  05/15/2015  FINDINGS: Cardiomegaly is again noted. Postsurgical changes are again seen. A new endotracheal tube and nasogastric catheter are noted in satisfactory position. The endotracheal tube is approximately 6.1 cm above the carina. The degree of aeration has improved although suggestion of small effusions are again noted. The degree of vascular congestion has also improved.  IMPRESSION: Improved aeration with suggestion of  small pleural effusions.  Endotracheal tube and nasogastric catheter in satisfactory position.   Electronically Signed   By: Inez Catalina M.D.   On: 05/15/2015 08:07   Dg Chest Port 1 View  05/15/2015   CLINICAL DATA:  Shortness of Breath  EXAM: PORTABLE CHEST - 1 VIEW  COMPARISON:  05/12/2015  FINDINGS: Cardiac shadow remains enlarged. Pacing device and postsurgical changes are again seen. The lungs are well aerated bilaterally. Mild increased vascular congestion is seen as well as small bilateral pleural effusions. No focal infiltrate is noted.  IMPRESSION: Mild vascular congestion and small pleural effusions.   Electronically Signed  By: Inez Catalina M.D.   On: 05/15/2015 07:45    Assessment:  1 CKD, Stage 4 2 Oliguric AKI, hemodynamically mediated 3 Lactic acidosis 4 VDRF 5 Pancytopenia, drug induced 6 EF 20-25% 7 Abnl LFTs 8 Coagulopathy 9 Hx of colon cancer, s/p colectomy  Plan: 1 Supportive therapy and additional volume expansion discussed with Dr. Lake Bells 2 We will assist with dialytic interventions as appropriate and needed  Thank you for asking [place] to see pt.  Kenedi Cilia C 05/15/2015, 3:08 PM

## 2015-05-15 NOTE — Progress Notes (Addendum)
Nutrition Follow-up  DOCUMENTATION CODES:  Severe malnutrition in context of chronic illness  INTERVENTION:  Tube feeding   Initiate TF via OGT with Vital AF 1.2 formula at 25 ml/h; on day 2, increase to goal rate of 60 ml/h (1440 ml per day) to provide 1728 kcals, 108 gm protein, 1168 ml free water daily  NEW NUTRITION DIAGNOSIS:  Inadequate oral intake related to inability to eat as evidenced by NPO status, ongoing  GOAL:  Patient will meet greater than or equal to 90% of their needs, currently unmet  MONITOR:  Vent status, TF tolerance, Weight trends, Labs, I & O's  ASSESSMENT: 81 y.o. male with PMH of hypertension, hyperlipidemia, GERD, atrial fibrillation on Coumadin, bi-ventricular pacemaker placement, history of rectal cancer (s/p of proctocolectomy, s/p of colostomy 2008), coronary artery disease (s/p of CABG), systolic congestive heart failure (EF of 40-45%), BPH, chronic kidney disease-stage IV, who presents with shortness breath.  Pt became unresponsive this AM and required intubation.  Patient is currently on ventilator support -- OGT in place MV: 11.5 L/min Temp (24hrs), Avg:98.4 F (36.9 C), Min:97.8 F (36.6 C), Max:99.5 F (37.5 C)   RD consulted for TF initiation & management.  Height:  Ht Readings from Last 1 Encounters:  05/14/15 5\' 10"  (1.778 m)    Weight:  Wt Readings from Last 1 Encounters:  05/15/15 155 lb 3.3 oz (70.4 kg)    Ideal Body Weight:  75.4 kg  Wt Readings from Last 10 Encounters:  05/15/15 155 lb 3.3 oz (70.4 kg)  05/02/2015 144 lb (65.318 kg)  05/12/15 145 lb 3.2 oz (65.862 kg)  04/15/15 134 lb 6.4 oz (60.963 kg)  04/11/15 135 lb (61.236 kg)  03/05/15 142 lb 12.8 oz (64.774 kg)  02/12/15 149 lb 8 oz (67.813 kg)  02/03/15 151 lb (68.493 kg)  12/18/14 152 lb 4.8 oz (69.083 kg)  12/12/14 154 lb (69.854 kg)    BMI:  Body mass index is 22.27 kg/(m^2).  Re-estimated Nutritional Needs:  Kcal:  0569  Protein:  100-110  gm  Fluid:  1.7-1.9 L  Skin:  Wound (see comment) (Stage I to buttock)  Diet Order:  Diet NPO time specified  EDUCATION NEEDS:  No education needs identified at this time   Intake/Output Summary (Last 24 hours) at 05/15/15 1452 Last data filed at 05/15/15 1300  Gross per 24 hour  Intake 1390.08 ml  Output    100 ml  Net 1290.08 ml    Last BM:  Not documented  Arthur Holms, RD, LDN Pager #: [phone] After-Hours Pager #: [phone]

## 2015-05-15 NOTE — Progress Notes (Signed)
OT Cancellation/Discharge  Note  Patient Details Name: Nicholas [name] MRN: [id number] DOB: 01-16-1934   Cancelled Treatment:    Reason Eval/Treat Not Completed: Medical issues which prohibited therapy.  Pt intubated this am.  Spoke with MD, will sign off at this time, please re-order when/if appropriate.   Darlina Rumpf [place], OTR/L [phone]  05/15/2015, 1:16 PM

## 2015-05-15 NOTE — Procedures (Signed)
Central Venous Catheter Insertion Procedure Note Nicholas [name] [id number] 14-May-1934  Procedure: Insertion of Central Venous Catheter Indications: Drug and/or fluid administration  Procedure Details Consent: Unable to obtain consent because of emergent medical necessity. Time Out: Verified patient identification, verified procedure, site/side was marked, verified correct patient position, special equipment/implants available, medications/allergies/relevent history reviewed, required imaging and test results available.  Performed  Maximum sterile technique was used including antiseptics, cap, gloves, gown, hand hygiene, mask and sheet. Skin prep: Chlorhexidine; local anesthetic administered A antimicrobial bonded/coated triple lumen catheter was placed in the right internal jugular vein using the Seldinger technique.  Ultrasound was used to verify the patency of the vein and for real time needle guidance.  Evaluation Blood flow good Complications: No apparent complications Patient did tolerate procedure well. Chest X-ray ordered to verify placement.  CXR: pending.  Procedure performed by Dot Lanes NP under my direct supervision.  Reality Nicholas [name] 05/15/2015, 8:45 AM

## 2015-05-15 NOTE — Progress Notes (Signed)
[facility]                                                                              Patient Demographics  Nicholas [name], is a 81 y.o. male, DOB - 1934-12-17, TOI:[id number]  Admit date - 05/23/2015   Admitting Physician Ivor Costa, MD  Outpatient Primary MD for the patient is Leonard Downing, MD  LOS - 1   Chief Complaint  Patient presents with  . Shortness of Breath       Brief HPI   Patient is a 81 y.o. male with hypertension, hyperlipidemia, GERD, atrial fibrillation on Coumadin, bi-ventricular pacemaker placement, history of rectal cancer (s/p of proctocolectomy, s/p of colostomy 2008), coronary artery disease (s/p of CABG), systolic congestive heart failure (EF of 40-45%), BPH, chronic kidney disease-stage IV, who presented with shortness of breath. Patient reported that he started having worsening shortness of breath on the day of admission, no chest pain, mild dry cough but no fevers or chills. He also reported worsening leg edema. His body weight increased by 5 pounds recently. Of note, in ED, he was found to have pan-cytopenenia with Hbg 12.6 on 04/15/15-->6.7, WBC 8.1-->2.04 and platelet 117-->8. He did not notice any bleeding, no dark or bloody stool. No abdominal pain. He had difficulty in urinating a day before the admission due to BPH but no hematuria and that has resolved. In ED, patient was found to have BNP 1324, INR 1.64, temperature 99.3, no tachycardia, stable renal function. Chest x-ray showed hyperinflation without infiltration. Patient was admitted to inpatient for further evaluation and treatment. 5/19 7:10am: At the time of my evaluation, patient was noted to be unresponsive, not cooperative with any exam including neuro testing. Per RN at the bedside, patient was talking at least 20 minutes ago and this was acute issue. Stat EKG showed paced rhythm, stat portable CXR showed pulmonary vascular congestion. BMET, ABG, stat CT head was ordered. CBG  came back 38, 1 amp of D50 was given however patient did not respond, repeat CBG was 234 with no improvement in the mental status. Due to possibility of impending respiratory failure with acute mental status change, critical care service was consulted urgently. Neurology was also consulted. Patient was then intubated and placed on mechanical ventilation by Dr. Lake Bells   Assessment & Plan    Principal Problem: Acute encephalopathy: Differential diagnosis includes marked hypoglycemia (patient however is not diabetic ), concern for intracranial hemorrhage or hemorrhagic stroke due to thrombocytopenia versus other causes , sepsis with marked neutropenia  - neurology consulted, stat CT head was ordered, cannot have MRI due to pacemaker.   Acute respiratory failure  - Critical care consulted, patient intubated and placed on mechanical ventilation, stat chest x-ray had shown bilateral pleural effusions with pulmonary vascular congestion     SOB (shortness of breath), acute on chronic systolic CHF with EF 83-38%  - 2-D echo was obtained yesterday which showed EF of 20-25%, severe hypokinesis of basal mid inferior and inferoseptal myocardium, EF was 40-45% in 10/15. Cardiology consult called  Symptomatic anemia, pancytopenia: Unclear etiology however patient has been seen by hematology, Dr. Learta Codding in the last  1 year and was thought to have possible myelodysplasia versus renal failure, has received erythropoietin in the past. Patient reports no bone marrow biopsy in the past. - Discussed in detail with Dr. Learta Codding this morning, suspect that pancytopenia is possibly related to Imuran which was started last month but it is possible that he may have acute leukemia. - Hematology recommended holding Imuran and begin colony stimulant and factor, transfusion, bone marrow biopsy for pancytopenia does not improve  Acute renal failure on chronic kidney disease stage III: Baseline creatinine around 2 - creatinine  acutely worsened to 4.1 today, repeat 4.63, patient will need nephrology consult and further workup. Currently under critical care service after intubation.    HYPERCHOLESTEROLEMIA - Continue statin  Atrial fibrillation - Currently rate controlled, CHADS2 VASC 4, Coumadin currently on hold due to severe symptomatic anemia     Myasthenia gravis -Followed up by Dr. Krista Blue, neurology, last seen on 05/15/2015, Continue prednisone and Mestinon   BPH - Continue Flomax, finasteride    Code Status: Full code   Family Communication: No family member at the bedside  Disposition Plan:   Time Spent in minutes 67 minutes  Consults   Hematology  critical care   neurology Cardiology   DVT Prophylaxis  SCD's  Medications  Scheduled Meds: . sodium chloride   Intravenous Once  . antiseptic oral rinse  7 mL Mouth Rinse BID  . B-complex with vitamin C   Oral Daily  . feeding supplement (ENSURE ENLIVE)  237 mL Oral BID BM  . fentaNYL      . ferrous sulfate  325 mg Oral BID WC  . finasteride  5 mg Oral Daily  . hydrALAZINE  25 mg Oral BID  . isosorbide dinitrate  10 mg Oral BID WC  . [COMPLETED] metoprolol      . metoprolol succinate  50 mg Oral BID  . midazolam      . predniSONE  10 mg Oral Q breakfast  . pyridostigmine  30 mg Oral 3 times per day  . simvastatin  20 mg Oral QHS  . sodium chloride  3 mL Intravenous Q12H  . tamsulosin  0.4 mg Oral QPC supper  . vitamin C  500 mg Oral Daily   Continuous Infusions:  PRN Meds:.sodium chloride, acetaminophen, nitroGLYCERIN, sodium chloride   Antibiotics   Anti-infectives    None        Subjective:   Nicholas [name] was seen and examined today.  completely unresponsive, unable to provide any review of system, per RN was alert and talking 20-30 minutes prior to my exam.   Objective:   Blood pressure 145/83, pulse 68, temperature 98 F (36.7 C), temperature source Oral, resp. rate 25, height $RemoveBe'5\' 10"'[id number]$  (1.778 m), weight 70.4 kg (155 lb  3.3 oz), SpO2 100 %.  Wt Readings from Last 3 Encounters:  05/15/15 70.4 kg (155 lb 3.3 oz)  05/05/2015 65.318 kg (144 lb)  05/12/15 65.862 kg (145 lb 3.2 oz)     Intake/Output Summary (Last 24 hours) at 05/15/15 0744 Last data filed at 05/15/15 0500  Gross per 24 hour  Intake 1238.66 ml  Output    100 ml  Net 1138.66 ml    Exam  General: Unresponsive  HEENT:  PERRLA, EOMI, Anicteic Sclera, mucous membranes moist.   Neck: Supple, no JVD, no masses  CVS: Irregularly irregular rhythm, tachycardia  Respiratory: Decreased breath sounds throughout   Abdomen: Soft, nontender, nondistended, + bowel sounds, colostomy+  Ext: no cyanosis  clubbing, 1+ edema  Neuro: unable to assess, patient unresponsive   Skin: No rashes  Psych: unresponsive    Data Review   Micro Results Recent Results (from the past 240 hour(s))  MRSA PCR Screening     Status: None   Collection Time: 05/14/15  6:48 AM  Result Value Ref Range Status   MRSA by PCR NEGATIVE NEGATIVE Final    Comment:        The GeneXpert MRSA Assay (FDA approved for NASAL specimens only), is one component of a comprehensive MRSA colonization surveillance program. It is not intended to diagnose MRSA infection nor to guide or monitor treatment for MRSA infections.     Radiology Reports Dg Chest 2 View  05/05/2015   CLINICAL DATA:  Shortness of breath for 1 day.  EXAM: CHEST  2 VIEW  COMPARISON:  Chest CT 04/16/2015  FINDINGS: Patient is post median sternotomy and CABG. Dual lead left-sided pacemaker remains in place. Mild cardiomegaly and tortuosity of the thoracic aorta. No pulmonary edema. The lungs are hyperinflated. Minimal blunting of the right costophrenic angle. No confluent airspace disease. No pneumothorax. Left proximal humerus prosthesis in place. No acute osseous abnormalities are seen.  IMPRESSION: 1.  No acute pulmonary process. 2. Hyperinflation. Minimal blunting of right costophrenic angle, may be  related to hyperinflation versus small pleural effusion.   Electronically Signed   By: Jeb Levering M.D.   On: 05/11/2015 22:48   Ct Chest Wo Contrast  04/16/2015   CLINICAL DATA:  Shortness of breath with exertion.  EXAM: CT CHEST WITHOUT CONTRAST  TECHNIQUE: Multidetector CT imaging of the chest was performed following the standard protocol without IV contrast.  COMPARISON:  CT scan of October 16, 2012.  FINDINGS: No pneumothorax or pleural effusion is noted. No acute pulmonary disease is noted. Status post coronary artery bypass graft. No evidence of thoracic aortic aneurysm is noted. Atherosclerotic calcifications of thoracic aorta are noted. No mediastinal mass or adenopathy is noted. No significant abnormality seen in the visualized portion of the upper abdomen. Status post left shoulder arthroplasty. No other significant abnormality seen within the visualized skeleton.  IMPRESSION: No acute cardiopulmonary abnormality seen.   Electronically Signed   By: Marijo Conception, M.D.   On: 04/16/2015 14:24    CBC  Recent Labs Lab 05/25/2015 2348 05/14/15 0119 05/14/15 1647 05/14/15 1858 05/15/15 0245  WBC 0.1* 0.2* 0.1* 0.2* 0.3*  HGB 6.7* 6.9* 8.6* 8.9* 9.9*  HCT 20.8* 21.6* 25.5* 26.4* 30.3*  PLT 8* 8* 36* 56* 38*  MCV 102.0* 101.9* 95.5 96.4 96.5  MCH 32.8 32.5 32.2 32.5 31.5  MCHC 32.2 31.9 33.7 33.7 32.7  RDW 14.1 14.3 16.7* 16.9* 16.7*  LYMPHSABS 0.1*  --   --   --   --   MONOABS 0.0*  --   --   --   --   EOSABS 0.0  --   --   --   --   BASOSABS 0.0  --   --   --   --     Chemistries   Recent Labs Lab 05/07/2015 2222 05/15/15 0245  NA 142 138  K 3.6 5.1  CL 100* 95*  CO2 29 24  GLUCOSE 97 82  BUN 40* 54*  CREATININE 2.82* 4.10*  CALCIUM 9.9 9.8   ------------------------------------------------------------------------------------------------------------------ estimated creatinine clearance is 14.8 mL/min (by C-G formula based on Cr of  4.1). ------------------------------------------------------------------------------------------------------------------ No results for input(s): HGBA1C in the last 72 hours. ------------------------------------------------------------------------------------------------------------------ No  results for input(s): CHOL, HDL, LDLCALC, TRIG, CHOLHDL, LDLDIRECT in the last 72 hours. ------------------------------------------------------------------------------------------------------------------ No results for input(s): TSH, T4TOTAL, T3FREE, THYROIDAB in the last 72 hours.  Invalid input(s): FREET3 ------------------------------------------------------------------------------------------------------------------  Recent Labs  05/15/15 0245  VITAMINB12 385  FERRITIN 2277*  TIBC 168*  IRON 105  RETICCTPCT <0.4*    Coagulation profile  Recent Labs Lab 05/08/15 1226 05/17/2015 2348  INR 1.3 1.64*    No results for input(s): DDIMER in the last 72 hours.  Cardiac Enzymes  Recent Labs Lab 05/14/15 0843 05/14/15 1647  TROPONINI 0.40* 0.84*   ------------------------------------------------------------------------------------------------------------------ Invalid input(s): POCBNP   Recent Labs  05/14/15 0523  GLUCAP 52     RAI,RIPUDEEP M.D. [facility] 05/15/2015, 7:44 AM  Pager: [phone]   Between 7am to 7pm - call Pager - [phone]  After 7pm go to [email or web address] - password [email or web address]  Call night coverage person covering after 7pm

## 2015-05-15 NOTE — Procedures (Signed)
Intubation Procedure Note PAO HAFFEY [id number] Feb 25, 1934  Procedure: Intubation Indications: Airway protection and maintenance  Procedure Details Consent: Unable to obtain consent because of emergent medical necessity. Time Out: Verified patient identification, verified procedure, site/side was marked, verified correct patient position, special equipment/implants available, medications/allergies/relevent history reviewed, required imaging and test results available.  Performed  Drugs Etomidate 20mg , Versed 2mg , Fentanyl 76mcg IV DL x 1 with MAC 3 blade Grade 1 view 8.0 ET tube passed through cords under direct visualization Placement confirmed with bilateral breath sounds, positive EtCO2 change and smoke in tube   Evaluation Hemodynamic Status: BP stable throughout; O2 sats: stable throughout Patient's Current Condition: stable Complications: No apparent complications Patient did tolerate procedure well. Chest X-ray ordered to verify placement.  CXR: tube position high-repostitioned.   Rigby Swamy 05/15/2015

## 2015-05-16 ENCOUNTER — Inpatient Hospital Stay (HOSPITAL_COMMUNITY): Payer: Medicare Other

## 2015-05-16 ENCOUNTER — Ambulatory Visit: Payer: Medicare Other | Admitting: Neurology

## 2015-05-16 ENCOUNTER — Encounter (HOSPITAL_COMMUNITY): Payer: Self-pay | Admitting: Certified Registered Nurse Anesthetist

## 2015-05-16 DIAGNOSIS — I2510 Atherosclerotic heart disease of native coronary artery without angina pectoris: Secondary | ICD-10-CM

## 2015-05-16 DIAGNOSIS — E780 Pure hypercholesterolemia: Secondary | ICD-10-CM

## 2015-05-16 DIAGNOSIS — I214 Non-ST elevation (NSTEMI) myocardial infarction: Secondary | ICD-10-CM | POA: Insufficient documentation

## 2015-05-16 DIAGNOSIS — I4891 Unspecified atrial fibrillation: Secondary | ICD-10-CM

## 2015-05-16 LAB — CBC WITH DIFFERENTIAL/PLATELET
Basophils Absolute: 0 10*3/uL (ref 0.0–0.1)
Basophils Relative: 0 % (ref 0–1)
EOS ABS: 0 10*3/uL (ref 0.0–0.7)
EOS PCT: 8 % — AB (ref 0–5)
HEMATOCRIT: 25.6 % — AB (ref 39.0–52.0)
HEMOGLOBIN: 8.7 g/dL — AB (ref 13.0–17.0)
Lymphocytes Relative: 88 % — ABNORMAL HIGH (ref 12–46)
Lymphs Abs: 0.1 10*3/uL — ABNORMAL LOW (ref 0.7–4.0)
MCH: 32 pg (ref 26.0–34.0)
MCHC: 34 g/dL (ref 30.0–36.0)
MCV: 94.1 fL (ref 78.0–100.0)
Monocytes Absolute: 0 10*3/uL — ABNORMAL LOW (ref 0.1–1.0)
Monocytes Relative: 4 % (ref 3–12)
NRBC: 2 /100{WBCs} — AB
Neutro Abs: 0 10*3/uL — ABNORMAL LOW (ref 1.7–7.7)
Neutrophils Relative %: 0 % — ABNORMAL LOW (ref 43–77)
Platelets: <5 10*3/uL — CL (ref 150–400)
RBC: 2.72 MIL/uL — ABNORMAL LOW (ref 4.22–5.81)
RDW: 15.7 % — ABNORMAL HIGH (ref 11.5–15.5)
WBC: 0.1 10*3/uL — AB (ref 4.0–10.5)

## 2015-05-16 LAB — RENAL FUNCTION PANEL
ALBUMIN: 1.9 g/dL — AB (ref 3.5–5.0)
ANION GAP: 14 (ref 5–15)
BUN: 59 mg/dL — ABNORMAL HIGH (ref 6–20)
CHLORIDE: 99 mmol/L — AB (ref 101–111)
CO2: 23 mmol/L (ref 22–32)
Calcium: 7.1 mg/dL — ABNORMAL LOW (ref 8.9–10.3)
Creatinine, Ser: 3.5 mg/dL — ABNORMAL HIGH (ref 0.61–1.24)
GFR calc non Af Amer: 15 mL/min — ABNORMAL LOW (ref >60)
GFR, EST AFRICAN AMERICAN: 18 mL/min — AB (ref >60)
Glucose, Bld: 146 mg/dL — ABNORMAL HIGH (ref 65–99)
POTASSIUM: 3.6 mmol/L (ref 3.5–5.1)
Phosphorus: 4 mg/dL (ref 2.5–4.6)
Sodium: 136 mmol/L (ref 135–145)

## 2015-05-16 LAB — CBC
HEMATOCRIT: 25.9 % — AB (ref 39.0–52.0)
Hemoglobin: 9 g/dL — ABNORMAL LOW (ref 13.0–17.0)
MCH: 31.3 pg (ref 26.0–34.0)
MCHC: 34.7 g/dL (ref 30.0–36.0)
MCV: 89.9 fL (ref 78.0–100.0)
Platelets: 9 10*3/uL — CL (ref 150–400)
RBC: 2.88 MIL/uL — ABNORMAL LOW (ref 4.22–5.81)
RDW: 15.9 % — AB (ref 11.5–15.5)
WBC: 0.2 10*3/uL — CL (ref 4.0–10.5)

## 2015-05-16 LAB — BASIC METABOLIC PANEL
Anion gap: 18 — ABNORMAL HIGH (ref 5–15)
BUN: 72 mg/dL — ABNORMAL HIGH (ref 6–20)
CHLORIDE: 98 mmol/L — AB (ref 101–111)
CO2: 21 mmol/L — ABNORMAL LOW (ref 22–32)
Calcium: 7.3 mg/dL — ABNORMAL LOW (ref 8.9–10.3)
Creatinine, Ser: 4.88 mg/dL — ABNORMAL HIGH (ref 0.61–1.24)
GFR calc Af Amer: 12 mL/min — ABNORMAL LOW (ref >60)
GFR, EST NON AFRICAN AMERICAN: 10 mL/min — AB (ref >60)
GLUCOSE: 102 mg/dL — AB (ref 65–99)
Potassium: 3.9 mmol/L (ref 3.5–5.1)
SODIUM: 137 mmol/L (ref 135–145)

## 2015-05-16 LAB — CARBOXYHEMOGLOBIN
CARBOXYHEMOGLOBIN: 0.8 % (ref 0.5–1.5)
Carboxyhemoglobin: 0.8 % (ref 0.5–1.5)
METHEMOGLOBIN: 0.7 % (ref 0.0–1.5)
Methemoglobin: 0.9 % (ref 0.0–1.5)
O2 Saturation: 68.3 %
O2 Saturation: 65.8 %
TOTAL HEMOGLOBIN: 9 g/dL — AB (ref 13.5–18.0)
Total hemoglobin: 9.2 g/dL — ABNORMAL LOW (ref 13.5–18.0)

## 2015-05-16 LAB — LACTIC ACID, PLASMA
LACTIC ACID, VENOUS: 7.6 mmol/L — AB (ref 0.5–2.0)
Lactic Acid, Venous: 6.6 mmol/L (ref 0.5–2.0)

## 2015-05-16 LAB — ERYTHROPOIETIN: Erythropoietin: 652.8 m[IU]/mL — ABNORMAL HIGH (ref 2.6–18.5)

## 2015-05-16 LAB — OCCULT BLOOD GASTRIC / DUODENUM (SPECIMEN CUP): OCCULT BLOOD, GASTRIC: NEGATIVE

## 2015-05-16 LAB — PHOSPHORUS
PHOSPHORUS: 5.3 mg/dL — AB (ref 2.5–4.6)
Phosphorus: 3.2 mg/dL (ref 2.5–4.6)

## 2015-05-16 LAB — GLUCOSE, CAPILLARY
GLUCOSE-CAPILLARY: 116 mg/dL — AB (ref 65–99)
Glucose-Capillary: 104 mg/dL — ABNORMAL HIGH (ref 65–99)
Glucose-Capillary: 111 mg/dL — ABNORMAL HIGH (ref 65–99)
Glucose-Capillary: 133 mg/dL — ABNORMAL HIGH (ref 65–99)
Glucose-Capillary: 138 mg/dL — ABNORMAL HIGH (ref 65–99)
Glucose-Capillary: 94 mg/dL (ref 65–99)
Glucose-Capillary: 99 mg/dL (ref 65–99)

## 2015-05-16 LAB — PREPARE RBC (CROSSMATCH)

## 2015-05-16 LAB — TROPONIN I
TROPONIN I: 3.07 ng/mL — AB (ref <0.031)
TROPONIN I: 2.49 ng/mL — AB (ref <0.031)
TROPONIN I: 1.65 ng/mL — AB (ref <0.031)

## 2015-05-16 MED ORDER — HEPARIN SODIUM (PORCINE) 1000 UNIT/ML DIALYSIS
1000.0000 [IU] | INTRAMUSCULAR | Status: DC | PRN
  Administered 2015-05-16: 3200 [IU] via INTRAVENOUS_CENTRAL
  Filled 2015-05-16 (×2): qty 6
  Filled 2015-05-16: qty 4

## 2015-05-16 MED ORDER — MIDAZOLAM HCL 2 MG/2ML IJ SOLN
2.0000 mg | Freq: Once | INTRAMUSCULAR | Status: AC
  Administered 2015-05-16: 2 mg via INTRAVENOUS

## 2015-05-16 MED ORDER — MIDAZOLAM HCL 2 MG/2ML IJ SOLN
INTRAMUSCULAR | Status: AC
  Filled 2015-05-16: qty 4

## 2015-05-16 MED ORDER — SODIUM CHLORIDE 0.9 % FOR CRRT
INTRAVENOUS_CENTRAL | Status: DC | PRN
  Filled 2015-05-16: qty 1000

## 2015-05-16 MED ORDER — DOBUTAMINE IN D5W 4-5 MG/ML-% IV SOLN
5.0000 ug/kg/min | INTRAVENOUS | Status: DC
  Administered 2015-05-16: 5 ug/kg/min via INTRAVENOUS
  Filled 2015-05-16 (×2): qty 250

## 2015-05-16 MED ORDER — PRISMASOL BGK 4/2.5 32-4-2.5 MEQ/L IV SOLN
INTRAVENOUS | Status: DC
  Administered 2015-05-16 – 2015-05-20 (×12): via INTRAVENOUS_CENTRAL
  Filled 2015-05-16 (×18): qty 5000

## 2015-05-16 MED ORDER — FENTANYL CITRATE (PF) 100 MCG/2ML IJ SOLN: 50.0000 ug | Freq: Once | INTRAMUSCULAR | Status: DC

## 2015-05-16 MED ORDER — SODIUM CHLORIDE 0.9 % IV SOLN
Freq: Once | INTRAVENOUS | Status: AC
  Administered 2015-05-16: 08:00:00 via INTRAVENOUS

## 2015-05-16 MED ORDER — FERROUS SULFATE 220 (44 FE) MG/5ML PO ELIX
220.0000 mg | ORAL_SOLUTION | Freq: Two times a day (BID) | ORAL | Status: DC
  Administered 2015-05-16 – 2015-05-19 (×8): 220 mg
  Filled 2015-05-16 (×11): qty 5

## 2015-05-16 MED ORDER — FENTANYL BOLUS VIA INFUSION
25.0000 ug | INTRAVENOUS | Status: DC | PRN
  Filled 2015-05-16: qty 25

## 2015-05-16 MED ORDER — FENTANYL CITRATE (PF) 100 MCG/2ML IJ SOLN
50.0000 ug | INTRAMUSCULAR | Status: DC | PRN
  Administered 2015-05-16: 100 ug via INTRAVENOUS
  Filled 2015-05-16: qty 2

## 2015-05-16 MED ORDER — FENTANYL CITRATE (PF) 2500 MCG/50ML IJ SOLN
25.0000 ug/h | INTRAMUSCULAR | Status: DC
  Administered 2015-05-16: 50 ug/h via INTRAVENOUS
  Administered 2015-05-18: 100 ug/h via INTRAVENOUS
  Administered 2015-05-19: 50 ug/h via INTRAVENOUS
  Filled 2015-05-16 (×3): qty 50

## 2015-05-16 MED ORDER — PRISMASOL BGK 4/2.5 32-4-2.5 MEQ/L IV SOLN
INTRAVENOUS | Status: DC
  Administered 2015-05-16 – 2015-05-20 (×25): via INTRAVENOUS_CENTRAL
  Filled 2015-05-16 (×40): qty 5000

## 2015-05-16 MED ORDER — DEXTROSE 5 % IV SOLN
2.0000 g | Freq: Two times a day (BID) | INTRAVENOUS | Status: DC
  Administered 2015-05-16 – 2015-05-18 (×6): 2 g via INTRAVENOUS
  Filled 2015-05-16 (×8): qty 2

## 2015-05-16 MED ORDER — PRISMASOL BGK 4/2.5 32-4-2.5 MEQ/L IV SOLN
INTRAVENOUS | Status: DC
Start: 2015-05-16 — End: 2015-05-20
  Administered 2015-05-16 – 2015-05-20 (×16): via INTRAVENOUS_CENTRAL
  Filled 2015-05-16 (×20): qty 5000

## 2015-05-16 NOTE — Procedures (Signed)
Arterial Catheter Insertion Procedure Note Nicholas [name] [id number] 04-01-1934  Procedure: Insertion of Arterial Catheter  Indications: Blood pressure monitoring and Frequent blood sampling  Procedure Details Consent: Unable to obtain consent because of patient on ventilator. Time Out: Verified patient identification, verified procedure, site/side was marked, verified correct patient position, special equipment/implants available, medications/allergies/relevent history reviewed, required imaging and test results available.  Performed  Maximum sterile technique was used including antiseptics, cap, gloves, gown, hand hygiene, mask and sheet. Skin prep: Chlorhexidine; local anesthetic administered 20 gauge catheter was inserted into left radial artery using the Seldinger technique.  Evaluation Blood flow good; BP tracing good. Complications: No apparent complications.   Philomena Doheny 05/16/2015

## 2015-05-16 NOTE — Progress Notes (Signed)
[facility] Progress Note Patient Name: Nicholas [name] DOB: September 04, 1934 MRN: [id number]   Date of Service  05/16/2015  HPI/Events of Note  Patient has been on Dobutamine IV infusion since 2:30 AM. SvO2 now = 68.3%. HR and BP appear to be tolerating the Dobutamine. Bedside nurse able to back of on Norepinephrine IV infusion.   eICU Interventions  Continue present management. Re-check Lactic Acid at 5 AM to assess clearance.      Intervention Category Major Interventions: Shock - evaluation and management;Hypotension - evaluation and management  Ngoc Detjen Cornelia Copa 05/16/2015, 4:15 AM

## 2015-05-16 NOTE — Progress Notes (Signed)
Pt starting CRRT therapy.  Will change cefepime to 2g IV Q12H and continue to monitor medications for dose adjustments given new CRRT.  Wynona Neat, PharmD, BCPS 05/16/2015 7:17 AM

## 2015-05-16 NOTE — Progress Notes (Signed)
Patient Name: Nicholas [name] Date of Encounter: 05/16/2015  Primary Cardiologist: Dr. Mare Ferrari   Principal Problem:   SOB (shortness of breath) Active Problems:   HYPERCHOLESTEROLEMIA   Essential hypertension   GERD   Personal history of malignant neoplasm of rectum, rectosigmoid junction, and anus   Hx of CABG   BPH (benign prostatic hyperplasia)   Coronary artery disease   Atrial fibrillation with slow ventricular response   Acute on chronic systolic HF (heart failure)   CKD (chronic kidney disease) stage 4, GFR 08-67 ml/min   Systolic CHF, chronic   Pancytopenia   Symptomatic anemia   Myasthenia gravis   Anemia   Protein-calorie malnutrition, severe   Acute respiratory failure with hypoxia   Encephalopathy acute   Acute on chronic renal failure   Respiratory failure    SUBJECTIVE  Intubated and sedated. On pressor. Per nurse, starting dialysis today  CURRENT MEDS . antiseptic oral rinse  7 mL Mouth Rinse QID  . B-complex with vitamin C   Oral Daily  . ceFEPime (MAXIPIME) IV  2 g Intravenous Q12H  . chlorhexidine  15 mL Mouth Rinse BID  . famotidine (PEPCID) IV  20 mg Intravenous Q24H  . fentaNYL (SUBLIMAZE) injection  50 mcg Intravenous Once  . ferrous sulfate  220 mg Per Tube BID WC  . hydrocortisone sodium succinate  50 mg Intravenous Q6H  . pyridostigmine  30 mg Per Tube 3 times per day  . simvastatin  20 mg Oral QHS  . sodium chloride  3 mL Intravenous Q12H  . Tbo-Filgrastim  480 mcg Subcutaneous Daily    OBJECTIVE  Filed Vitals:   05/16/15 0945 05/16/15 1000 05/16/15 1100 05/16/15 1140  BP:   124/62   Pulse: 70 64 134 129  Temp:    98.1 F (36.7 C)  TempSrc:    Oral  Resp: 20 20 20 20   Height:      Weight:      SpO2: 100% 100% 100% 100%    Intake/Output Summary (Last 24 hours) at 05/16/15 1152 Last data filed at 05/16/15 1140  Gross per 24 hour  Intake 5249.16 ml  Output    920 ml  Net 4329.16 ml   Filed Weights   05/14/15 0524  05/15/15 0500 05/16/15 0435  Weight: 154 lb 8.7 oz (70.1 kg) 155 lb 3.3 oz (70.4 kg) 158 lb 8.2 oz (71.9 kg)    PHYSICAL EXAM  General: Intubated, sedated HEENT:  Normal  Neck: +JVD Lungs: on vent, anterior exam CTA. Heart: regularly irregular. no s3, s4, or murmurs. Abdomen: Soft, non-distended, BS + x 4.  Extremities: SCD in place.   Accessory Clinical Findings  CBC  Recent Labs  05/12/2015 2348  05/15/15 0245 05/16/15 0431  WBC 0.1*  < > 0.3* 0.1*  NEUTROABS 0.0*  --   --  0.0*  HGB 6.7*  < > 9.9* 8.7*  HCT 20.8*  < > 30.3* 25.6*  MCV 102.0*  < > 96.5 94.1  PLT 8*  < > 38* <5*  < > = values in this interval not displayed. Basic Metabolic Panel  Recent Labs  05/15/15 1215 05/15/15 1745 05/15/15 1950 05/16/15 0431 05/16/15 0823  NA  --  137  --  137  --   K  --  3.5  --  3.9  --   CL  --  100*  --  98*  --   CO2  --  18*  --  21*  --  GLUCOSE  --  258*  --  102*  --   BUN  --  67*  --  72*  --   CREATININE  --  4.61*  --  4.88*  --   CALCIUM  --  7.7*  --  7.3*  --   MG 1.9  --  1.7  --   --   PHOS 6.9*  --  4.6  --  5.3*   Liver Function Tests  Recent Labs  05/15/15 1215  AST 155*  ALT 81*  ALKPHOS 30*  BILITOT 5.6*  PROT 5.2*  ALBUMIN 2.3*   Cardiac Enzymes  Recent Labs  05/15/15 1515 05/15/15 1950 05/16/15 0215  TROPONINI 1.62* 2.31* 3.07*    TELE A-fib with ventricular pacing, HR 60s    ECG  A-fib ventricularlly paced  Echocardiogram 05/14/2015  LV EF: 20% -  25%  ------------------------------------------------------------------- Indications:   CHF - 428.0.  ------------------------------------------------------------------- History:  PMH:  Syncope and dyspnea. Risk factors: Hypertension.  ------------------------------------------------------------------- Study Conclusions  - Left ventricle: The cavity size was normal. Wall thickness was increased in a pattern of mild LVH. Systolic function was severely  reduced. The estimated ejection fraction was in the range of 20% to 25%. There is severe hypokinesis of the basal-midinferior and inferoseptal myocardium. - Aortic valve: There was mild regurgitation. - Mitral valve: Calcified annulus. Mildly thickened leaflets . There was mild regurgitation. - Left atrium: The atrium was mildly dilated. - Right atrium: The atrium was mildly dilated.    Radiology/Studies  Dg Chest 2 View  05/07/2015   CLINICAL DATA:  Shortness of breath for 1 day.  EXAM: CHEST  2 VIEW  COMPARISON:  Chest CT 04/16/2015  FINDINGS: Patient is post median sternotomy and CABG. Dual lead left-sided pacemaker remains in place. Mild cardiomegaly and tortuosity of the thoracic aorta. No pulmonary edema. The lungs are hyperinflated. Minimal blunting of the right costophrenic angle. No confluent airspace disease. No pneumothorax. Left proximal humerus prosthesis in place. No acute osseous abnormalities are seen.  IMPRESSION: 1.  No acute pulmonary process. 2. Hyperinflation. Minimal blunting of right costophrenic angle, may be related to hyperinflation versus small pleural effusion.   Electronically Signed   By: Jeb Levering M.D.   On: 04/30/2015 22:48   Ct Head Wo Contrast  05/15/2015   CLINICAL DATA:  Decreased platelet count, possible CVA or brain bleed  EXAM: CT HEAD WITHOUT CONTRAST  TECHNIQUE: Contiguous axial images were obtained from the base of the skull through the vertex without intravenous contrast.  COMPARISON:  09/27/2014  FINDINGS: No skull fracture is noted. Stable postsurgical changes right maxillary sinus. The mastoid air cells are unremarkable.  No intracranial hemorrhage, mass effect or midline shift. Stable cerebral atrophy. Stable periventricular and patchy subcortical chronic white matter disease. Stable cerebellar atrophy. No acute cortical infarction. No mass lesion is noted on this unenhanced scan.  IMPRESSION: No acute intracranial abnormality. Stable  atrophy and chronic white matter disease.   Electronically Signed   By: Lahoma Crocker M.D.   On: 05/15/2015 10:34   Ct Chest Wo Contrast  04/16/2015   CLINICAL DATA:  Shortness of breath with exertion.  EXAM: CT CHEST WITHOUT CONTRAST  TECHNIQUE: Multidetector CT imaging of the chest was performed following the standard protocol without IV contrast.  COMPARISON:  CT scan of October 16, 2012.  FINDINGS: No pneumothorax or pleural effusion is noted. No acute pulmonary disease is noted. Status post coronary artery bypass graft. No evidence of thoracic  aortic aneurysm is noted. Atherosclerotic calcifications of thoracic aorta are noted. No mediastinal mass or adenopathy is noted. No significant abnormality seen in the visualized portion of the upper abdomen. Status post left shoulder arthroplasty. No other significant abnormality seen within the visualized skeleton.  IMPRESSION: No acute cardiopulmonary abnormality seen.   Electronically Signed   By: Marijo Conception, M.D.   On: 04/16/2015 14:24   Portable Chest Xray  05/16/2015   CLINICAL DATA:  Respiratory failure.  EXAM: PORTABLE CHEST - 1 VIEW  COMPARISON:  05/15/2015 .  FINDINGS: Endotracheal tube, NG tube, right IJ line stable position. Prior CABG. Cardiac pacer stable position. Stable cardiomegaly. Mild bilateral pulmonary interstitial prominence and pleural effusions. Findings consistent with mild congestive heart failure . Left shoulder replacement.  IMPRESSION: 1. Lines and tubes in stable position. 2. Prior CABG. Cardiac pacer stable position. Cardiomegaly with bilateral pulmonary interstitial prominence and small pleural effusions consistent congestive heart failure   Electronically Signed   By: [name]  Register   On: 05/16/2015 07:20   Dg Chest Port 1 View  05/15/2015   CLINICAL DATA:  Acute respiratory failure with hypoxia. Tube repositioned. Check placement.  EXAM: PORTABLE CHEST - 1 VIEW  COMPARISON:  05/15/2015  FINDINGS: Endotracheal tube is in  place, 4.3 cm above carina. Right IJ central line tip overlies the superior vena cava. Nasogastric tube tip is off the film but beyond the gastroesophageal junction. Left-sided transvenous pacemaker leads overlying the right ventricle and coronary sinus.  Heart is mildly enlarged. There is minimal right lower lobe opacity, improved possibly related to positioning. Suspect right pleural scratched a suspect bilateral pleural effusions. There is pulmonary vascular congestion.  IMPRESSION: 1. Repositioned endotracheal tube. 2. Bilateral pleural effusions.   Electronically Signed   By: Nolon Nations M.D.   On: 05/15/2015 13:11   Dg Chest Port 1 View  05/15/2015   CLINICAL DATA:  Central line placement.  EXAM: PORTABLE CHEST - 1 VIEW  COMPARISON:  Same day.  FINDINGS: Stable cardiomediastinal silhouette. Stable bibasilar opacities are noted concerning for atelectasis or edema with mild associated pleural effusions. No definite pneumothorax is noted. Left-sided pacemaker is unchanged in position. Status post coronary artery bypass graft. Interval placement of right internal jugular catheter line with distal tip overlying expected position of the SVC. Endotracheal tube has advanced with distal tip less than 1 cm above the carina. Nasogastric tube tip is seen entering stomach.  IMPRESSION: Interval placement of right internal jugular catheter with distal tip overlying expected position of the SVC. No pneumothorax is noted. Stable bibasilar opacities are noted concerning for atelectasis or edema with associated mild pleural effusions.  Endotracheal tube is now seen with distal tip less than 1 cm above the carina. Withdrawal by 2-3 cm is recommended. Critical Value/emergent results were called by telephone at the time of interpretation on 05/15/2015 at 9:15 am to Rosine Door, the patient's nurse, who verbally acknowledged these results.   Electronically Signed   By: Marijo Conception, M.D.   On: 05/15/2015 09:15   Dg  Chest Port 1 View  05/15/2015   CLINICAL DATA:  Status post endotracheal tube and nasogastric catheter placement  EXAM: PORTABLE CHEST - 1 VIEW  COMPARISON:  05/15/2015  FINDINGS: Cardiomegaly is again noted. Postsurgical changes are again seen. A new endotracheal tube and nasogastric catheter are noted in satisfactory position. The endotracheal tube is approximately 6.1 cm above the carina. The degree of aeration has improved although suggestion of small effusions are  again noted. The degree of vascular congestion has also improved.  IMPRESSION: Improved aeration with suggestion of small pleural effusions.  Endotracheal tube and nasogastric catheter in satisfactory position.   Electronically Signed   By: Inez Catalina M.D.   On: 05/15/2015 08:07   Dg Chest Port 1 View  05/15/2015   CLINICAL DATA:  Shortness of Breath  EXAM: PORTABLE CHEST - 1 VIEW  COMPARISON:  04/28/2015  FINDINGS: Cardiac shadow remains enlarged. Pacing device and postsurgical changes are again seen. The lungs are well aerated bilaterally. Mild increased vascular congestion is seen as well as small bilateral pleural effusions. No focal infiltrate is noted.  IMPRESSION: Mild vascular congestion and small pleural effusions.   Electronically Signed   By: Inez Catalina M.D.   On: 05/15/2015 07:45    ASSESSMENT AND PLAN  1. Acute on chronic systolic failure  - EF 45-40% in 09/2014  - Echo 5/18 showed LV EF of 20-25%, mild LVH, severe hypokinesis of the basal-mid inferior and inferoseptal myocardium, mild aortic reg, and mild mitral regur  - although has significant drop in EF, not currently a cath candidate given renal failure  - pending initiation of dialysis today. Continue maintenance support, not much to offer at this time from cardiac perspective, will followup with his progress.  - ?if has low perfusion, at some point, may be beneficial to let HF service to see the patient, check coox vs RHC once stable  2. Permanent atrial  fibrillation   - CHADSVASc score is at least 5. (age 42, CHF 1, HTN 1, CAD 1). On Coumadin at home, stopped with thrombocytopenia  3. Elevated troponin: trop 1.62 --> 2.31 -- >3.07.    - likely a combination of shock and underlying ischemia. No a good invasive workup candidate given failure renal function.  4. hx of CAD s/p CABG 1993  5. Pancytopenia with severe thrombocytopenia -Per primary  6. CKD stage IV - Baseline creatinine 2.6-3.0. Cr worsening 4.88. Now severely oliguric vs anuric  7. Acute encephalopathy - Head CT showed no acute intracranial abnormality. Stable atrophy and chronic white matter disease. Cannot have MRI due to pacemaker. Further management per primary.  8. Acute respiratory failure with hypoxemia  - intubated now, full vent support, per primary   9. Hypotension - off all BP med, pressor support    10. High degree AV block s/p CRT 10/01/2014  Signed, Almyra Deforest PA-C Pager: [phone]  The patient was seen, examined and discussed with Almyra Deforest, PA-C and I agree with the above.   81 year old male with acute on chronic heart failure, respiratory failure and acute on CKD. The patient is intubated, being started on HD as he is oliguric.  He has h/o CABG in 1993, currently with elevated troponin with max 3.07 -> 2.49. Unfortunately, we wont be able to do any invasive measures with the current situation - acute renal failure, severe thrombocytopenia. The patient is in persistent a-fib, on coumadin at ome, we wont be able to use heparin as he is severely thrombocytopenic. Ischemic work up once he improves.  Unable to use any HF medications as he is under pressor support. No ASA. Continue simvastatin.  His baseline weight is 142 lbs, currently 158.   Dorothy Spark 05/16/2015

## 2015-05-16 NOTE — Progress Notes (Addendum)
[facility] Progress Note Patient Name: Nicholas [name] DOB: 1934-05-23 MRN: [id number]   Date of Service  05/16/2015  HPI/Events of Note  Repeat Lactic Acid = 7.6. Lactic Acid not clearing as rapidly as expected. CVP = 14 and Hgb = 9.9. LVEF from 05/14/2015 = 20% to 25%. SvO2 from this afternoon = 56.1. Clinical picture suggests that he is not clearing his lactic acid d/t his poor cardiac function.   eICU Interventions  Will start an empiric Dobutamine IV infusion at 5 mcg/kg/min as tolerated by HR and BP. If he tolerates theDobutamine IV infusion, will re-check co-ox for mixed venous saturation.     Intervention Category Major Interventions: Shock - evaluation and management;Hypotension - evaluation and management  Sommer,Steven Eugene 05/16/2015, 2:03 AM

## 2015-05-16 NOTE — Progress Notes (Signed)
PULMONARY / CRITICAL CARE MEDICINE   Name: Nicholas [name] MRN: [id number] DOB: Apr 06, 1934    ADMISSION DATE:  05/25/2015 CONSULTATION DATE:  05/15/2015  REFERRING MD :  Tana Coast  CHIEF COMPLAINT:  Acute confusion  INITIAL PRESENTATION: 81 y/o male admitted on 5/18 to [facility] with pancytopenia and dyspnea.  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  STUDIES:  5/19 CT head >> NAICP 5/18 Echo> LVEF 20-25%, inferior WMA's, mild AI, mild MR, LAE, RAE  SIGNIFICANT EVENTS: 5/19 early AM became unresponsive, required intubation 5/20 Worsening shock, lactic acidosis, started on BICAB GTT, neurology, cardiology and nephrology consulted  SUBJECTIVE:  Worsening shock, lactic acidosis, started on BICAB GTT and dobutamine, neurology, cardiology and nephrology consulted  VITAL SIGNS: Temp:  [97.8 F (36.6 C)-98.9 F (37.2 C)] 98.9 F (37.2 C) (05/20 0345) Pulse Rate:  [25-220] 56 (05/20 0645) Resp:  [15-27] 20 (05/20 0645) BP: (52-152)/(30-113) 93/44 mmHg (05/20 0645) SpO2:  [72 %-100 %] 97 % (05/20 0645) FiO2 (%):  [50 %-100 %] 50 % (05/20 0400) Weight:  [71.9 kg (158 lb 8.2 oz)] 71.9 kg (158 lb 8.2 oz) (05/20 0435) HEMODYNAMICS: CVP:  [9 mmHg-14 mmHg] 9 mmHg VENTILATOR SETTINGS: Vent Mode:  [-] PRVC FiO2 (%):  [50 %-100 %] 50 % Set Rate:  [20 bmp] 20 bmp Vt Set:  [580 mL] 580 mL PEEP:  [5 cmH20] 5 cmH20 Plateau Pressure:  [15 cmH20-20 cmH20] 20 cmH20 INTAKE / OUTPUT:  Intake/Output Summary (Last 24 hours) at 05/16/15 0700 Last data filed at 05/16/15 0600  Gross per 24 hour  Intake 4545.46 ml  Output    920 ml  Net 3625.46 ml    PHYSICAL EXAMINATION: General:  Awake on vent Neuro:  Awake on vent, interactive HEENT:  NCAT, OP clear Cardiovascular:  Irregular, tachy, increased JVD noted Lungs:  CTA B, no rhonchi GI: BS+, soft, non tender Musculoskeletal:  Decreased bulk Skin:  No rash or skin breakdown  LABS:  CBC  Recent Labs Lab 05/14/15 1858 05/15/15 0245  05/16/15 0431  WBC 0.2* 0.3* 0.1*  HGB 8.9* 9.9* 8.7*  HCT 26.4* 30.3* 25.6*  PLT 56* 38* PENDING   Coag's  Recent Labs Lab 05/10/2015 2348 05/15/15 1215  INR 1.64* 2.27*   BMET  Recent Labs Lab 05/15/15 0950 05/15/15 1745 05/16/15 0431  NA 140 137 137  K 4.0 3.5 3.9  CL 101 100* 98*  CO2 19* 18* 21*  BUN 62* 67* 72*  CREATININE 4.63* 4.61* 4.88*  GLUCOSE 131* 258* 102*   Electrolytes  Recent Labs Lab 05/15/15 0950 05/15/15 1215 05/15/15 1745 05/15/15 1950 05/16/15 0431  CALCIUM 8.6*  --  7.7*  --  7.3*  MG  --  1.9  --  1.7  --   PHOS  --  6.9*  --  4.6  --    Sepsis Markers  Recent Labs Lab 05/15/15 1745 05/16/15 0025 05/16/15 0430  LATICACIDVEN 7.8* 7.6* 6.6*   ABG  Recent Labs Lab 05/15/15 1050  PHART 7.305*  PCO2ART 30.0*  PO2ART 444*   Liver Enzymes  Recent Labs Lab 05/15/15 1215  AST 155*  ALT 81*  ALKPHOS 30*  BILITOT 5.6*  ALBUMIN 2.3*   Cardiac Enzymes  Recent Labs Lab 05/15/15 1515 05/15/15 1950 05/16/15 0215  TROPONINI 1.62* 2.31* 3.07*   Glucose  Recent Labs Lab 05/15/15 0735 05/15/15 1558 05/15/15 1809 05/15/15 1947 05/16/15 05/16/15 0342  GLUCAP 234* 61* 70 73 99 104*    Imaging  5/19 CXR > ETT elevated, bibasilar atelectasis and effusions, cardiomegally   ASSESSMENT / PLAN:  NEUROLOGIC A:  Acute encephalopathy> improved Myasthenia, does not appear to be flaring right now P:   RASS goal: -1 STAT CT head Neurology consult Intermittent fentanyl per PAD protocol (increase high end to 193mcg) Hold meds that would exacerbate myasthenia Continue mestinon  PULMONARY OETT 5/19 >> A:Acute respiratory failure with hypoxemia due to CHF exacerbation, DDX includes HCAP, was febrile 5/18 P:   Full vent support ABG now Daily CXR See ID  CARDIOVASCULAR CVL 5/19 >> A:  Acute decompensated CHF, LVEF 20-25% which is new with new WMA's, ddx includes ischemia but EKG non-specific on admission Demand  ischemia Cardiogenic shock, complicated by possible sepsis and anemia, but currently afebrile so not sure how much sepsis is contributing 5/20 Afib P:  Continue dobutamine, hopefully can come off by transfusing blood Repeat coox after blood transfusion Continue levophed Place A-line F/u cardiology recs  RENAL A:  Acute on chronic renal insufficiency> oliguric 5/20 P:   Appreciate renal Start CVVHD today> will place HD cath  GASTROINTESTINAL A:  Abnormal LFTs with elevated direct bili> no clear source of hepatic congetion, may be related to heart failure P:   OG tube pepcid for SUP Continue tube feedings RUQ ultrasound  HEMATOLOGIC A:  Acute pancytopenia on admission, most likely due to Imuran in setting of renal failure Neutropenia P:  No imuran Monitor for bleeding Daily CBC Transfuse 1 U PRBC due to ischemia, cardiogenic shock  INFECTIOUS A:  Neutropenic fever with infiltrates, could be HCAP but favor pulm edema as cause of CXR abnormalities P:   BCx2 5/19 >> Sputum 5/19 >>  Cefepime 5/19 >> Monitor culture closely, hold vanc due to myasthenia  ENDOCRINE A:  Hypoglycemia> corrected P:   Monitor glucose closely Continue D5   FAMILY  - Updates: Updated family 5/19, will update 5/20  My CC time 45 minutes independent of procedures  Roselie Awkward, MD [place] [facility] Pager: [phone] Cell: [phone] If no response, call [phone]   05/16/2015, 7:00 AM

## 2015-05-16 NOTE — Progress Notes (Signed)
[facility] Progress Note Patient Name: ANMOL FLECK DOB: 02-04-1934 MRN: [id number]   Date of Service  05/16/2015  HPI/Events of Note  Patient has large volume dark residuals from enteral tube.   eICU Interventions  Will order specimen for occult gastric blood.      Intervention Category Minor Interventions: Routine modifications to care plan (e.g. PRN medications for pain, fever)  Tomia Enlow Eugene 05/16/2015, 6:15 AM

## 2015-05-16 NOTE — Procedures (Signed)
Hemodialysis Catheter Insertion Procedure Note NICHOLA WARREN [id number] 14-Aug-1934  Procedure: Insertion of Hemodialysis Catheter Indications: Hemodialysis  Procedure Details Consent: Risks of procedure as well as the alternatives and risks of each were explained to the (patient/caregiver).  Consent for procedure obtained.  Time Out: Verified patient identification, verified procedure, site/side was marked, verified correct patient position, special equipment/implants available, medications/allergies/relevent history reviewed, required imaging and test results available.  Performed  Maximum sterile technique was used including antiseptics, cap, gloves, gown, hand hygiene, mask and sheet.  Skin prep: Chlorhexidine; local anesthetic administered  A Trialysis HD catheter was placed in the right femoral vein due to limited access (R IJ TLC, L pacemaker) using the Seldinger technique, sutured in place.   Evaluation Blood flow good Complications: No apparent complications Patient did tolerate procedure well.    Procedure performed under direct supervision of Dr. Lake Bells and with ultrasound guidance for real time vessel cannulation.     Noe Gens, NP-C [facility] Pgr: [phone] or [phone] 05/16/2015, 9:13 AM

## 2015-05-16 NOTE — Progress Notes (Signed)
CRITICAL VALUE ALERT  Critical value received: Lactic Acid 6.6  Date of notification:  05-16-15  Time of notification:  0600  Critical value read back:Yes.    Nurse who received alert:  [place]   MD notified (1st page):  Dr. Oletta Darter  Time of first page:  0600  Responding MD: Dr.Sommer  Time MD responded:  0600

## 2015-05-16 NOTE — Progress Notes (Signed)
NEURO HOSPITALIST PROGRESS NOTE   SUBJECTIVE:                                                                                                                        Patient is intubated on the vent, but awake and following commands. Wants ET out. CT brain was personally reviewed and showed no acute abnormality. Serologies significant for Cr 4.88, platelets<5,000, Hgb 8.7, lactic acid 6.6, troponin 3.07  OBJECTIVE:                                                                                                                           Vital signs in last 24 hours: Temp:  [97.8 F (36.6 C)-98.9 F (37.2 C)] 98 F (36.7 C) (05/20 0815) Pulse Rate:  [25-220] 89 (05/20 0815) Resp:  [15-27] 20 (05/20 0815) BP: (52-152)/(30-113) 99/52 mmHg (05/20 0815) SpO2:  [72 %-100 %] 100 % (05/20 0815) FiO2 (%):  [50 %-60 %] 50 % (05/20 0815) Weight:  [71.9 kg (158 lb 8.2 oz)] 71.9 kg (158 lb 8.2 oz) (05/20 0435)  Intake/Output from previous day: 05/19 0701 - 05/20 0700 In: 4545.5 [I.V.:2535.5; NG/GT:410; IV Piggyback:1100] Out: 920 [Urine:25; Emesis/NG output:820; Stool:75] Intake/Output this shift: Total I/O In: 30 [Blood:30] Out: -  Nutritional status: Diet NPO time specified  Past Medical History  Diagnosis Date  . 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 Pure hypercholesterolemia     takes Vytorin daily  . Diverticulosis of colon (without mention of hemorrhage)   . Hypertension     takes Amlodipine,Metoprolol,and Cardura daily  . Coronary artery disease     a. s/p CABG  . CHF (congestive heart failure)     a. s/p STJ CRTD  . H/O blood clots 1993  . Arthritis   . Skin cancer     nose  . GERD (gastroesophageal reflux disease)        . Enlarged prostate        . Insomnia   . CKD (chronic kidney disease) stage 4, GFR 15-29 ml/min   . Permanent atrial fibrillation   . PVC (premature ventricular contraction)   . Ischemic cardiomyopathy   .  HLD (hyperlipidemia)   . Blood transfusion without reported  diagnosis   . Myasthenia gravis    Physical exam: intubated on the vent. Head: normocephalic. Neck: supple, no bruits, no JVD. Cardiac: no murmurs. Lungs: clear. Abdomen: soft, no tender, no mass. Extremities: no edema. Skin: no rash  Neurologic Exam:  Mental status: intubated on the vent but awake and trying to communicate and complain about his ET. CN 2-12: pupils 2 mm,reactive. No gaze preference. No nystagmus on primary gaze. Face appears symmetric. Tongue: intubated. Motor: moving all limbs spontaneously and symmetrically Sensory: localizes noxious stimuli DTR's: 1 all over Plantars: mute. Coordination and gait: unable to test.  Lab Results: Lab Results  Component Value Date/Time   CHOL 120 03/05/2015 10:08 AM   Lipid Panel No results for input(s): CHOL, TRIG, HDL, CHOLHDL, VLDL, LDLCALC in the last 72 hours.  Studies/Results: Ct Head Wo Contrast  05/15/2015   CLINICAL DATA:  Decreased platelet count, possible CVA or brain bleed  EXAM: CT HEAD WITHOUT CONTRAST  TECHNIQUE: Contiguous axial images were obtained from the base of the skull through the vertex without intravenous contrast.  COMPARISON:  09/27/2014  FINDINGS: No skull fracture is noted. Stable postsurgical changes right maxillary sinus. The mastoid air cells are unremarkable.  No intracranial hemorrhage, mass effect or midline shift. Stable cerebral atrophy. Stable periventricular and patchy subcortical chronic white matter disease. Stable cerebellar atrophy. No acute cortical infarction. No mass lesion is noted on this unenhanced scan.  IMPRESSION: No acute intracranial abnormality. Stable atrophy and chronic white matter disease.   Electronically Signed   By: Lahoma Crocker M.D.   On: 05/15/2015 10:34   Portable Chest Xray  05/16/2015   CLINICAL DATA:  Respiratory failure.  EXAM: PORTABLE CHEST - 1 VIEW  COMPARISON:  05/15/2015 .  FINDINGS: Endotracheal tube,  NG tube, right IJ line stable position. Prior CABG. Cardiac pacer stable position. Stable cardiomegaly. Mild bilateral pulmonary interstitial prominence and pleural effusions. Findings consistent with mild congestive heart failure . Left shoulder replacement.  IMPRESSION: 1. Lines and tubes in stable position. 2. Prior CABG. Cardiac pacer stable position. Cardiomegaly with bilateral pulmonary interstitial prominence and small pleural effusions consistent congestive heart failure   Electronically Signed   By: [name]  Register   On: 05/16/2015 07:20   Dg Chest Port 1 View  05/15/2015   CLINICAL DATA:  Acute respiratory failure with hypoxia. Tube repositioned. Check placement.  EXAM: PORTABLE CHEST - 1 VIEW  COMPARISON:  05/15/2015  FINDINGS: Endotracheal tube is in place, 4.3 cm above carina. Right IJ central line tip overlies the superior vena cava. Nasogastric tube tip is off the film but beyond the gastroesophageal junction. Left-sided transvenous pacemaker leads overlying the right ventricle and coronary sinus.  Heart is mildly enlarged. There is minimal right lower lobe opacity, improved possibly related to positioning. Suspect right pleural scratched a suspect bilateral pleural effusions. There is pulmonary vascular congestion.  IMPRESSION: 1. Repositioned endotracheal tube. 2. Bilateral pleural effusions.   Electronically Signed   By: Nolon Nations M.D.   On: 05/15/2015 13:11   Dg Chest Port 1 View  05/15/2015   CLINICAL DATA:  Central line placement.  EXAM: PORTABLE CHEST - 1 VIEW  COMPARISON:  Same day.  FINDINGS: Stable cardiomediastinal silhouette. Stable bibasilar opacities are noted concerning for atelectasis or edema with mild associated pleural effusions. No definite pneumothorax is noted. Left-sided pacemaker is unchanged in position. Status post coronary artery bypass graft. Interval placement of right internal jugular catheter line with distal tip overlying expected position of the SVC.  Endotracheal tube has advanced with distal tip less than 1 cm above the carina. Nasogastric tube tip is seen entering stomach.  IMPRESSION: Interval placement of right internal jugular catheter with distal tip overlying expected position of the SVC. No pneumothorax is noted. Stable bibasilar opacities are noted concerning for atelectasis or edema with associated mild pleural effusions.  Endotracheal tube is now seen with distal tip less than 1 cm above the carina. Withdrawal by 2-3 cm is recommended. Critical Value/emergent results were called by telephone at the time of interpretation on 05/15/2015 at 9:15 am to Rosine Door, the patient's nurse, who verbally acknowledged these results.   Electronically Signed   By: Marijo Conception, M.D.   On: 05/15/2015 09:15   Dg Chest Port 1 View  05/15/2015   CLINICAL DATA:  Status post endotracheal tube and nasogastric catheter placement  EXAM: PORTABLE CHEST - 1 VIEW  COMPARISON:  05/15/2015  FINDINGS: Cardiomegaly is again noted. Postsurgical changes are again seen. A new endotracheal tube and nasogastric catheter are noted in satisfactory position. The endotracheal tube is approximately 6.1 cm above the carina. The degree of aeration has improved although suggestion of small effusions are again noted. The degree of vascular congestion has also improved.  IMPRESSION: Improved aeration with suggestion of small pleural effusions.  Endotracheal tube and nasogastric catheter in satisfactory position.   Electronically Signed   By: Inez Catalina M.D.   On: 05/15/2015 08:07   Dg Chest Port 1 View  05/15/2015   CLINICAL DATA:  Shortness of Breath  EXAM: PORTABLE CHEST - 1 VIEW  COMPARISON:  05/21/2015  FINDINGS: Cardiac shadow remains enlarged. Pacing device and postsurgical changes are again seen. The lungs are well aerated bilaterally. Mild increased vascular congestion is seen as well as small bilateral pleural effusions. No focal infiltrate is noted.  IMPRESSION: Mild  vascular congestion and small pleural effusions.   Electronically Signed   By: Inez Catalina M.D.   On: 05/15/2015 07:45    MEDICATIONS                                                                                                                        Scheduled: . sodium chloride   Intravenous Once  . antiseptic oral rinse  7 mL Mouth Rinse QID  . B-complex with vitamin C   Oral Daily  . ceFEPime (MAXIPIME) IV  2 g Intravenous Q12H  . chlorhexidine  15 mL Mouth Rinse BID  . famotidine (PEPCID) IV  20 mg Intravenous Q24H  . ferrous sulfate  325 mg Oral BID WC  . hydrocortisone sodium succinate  50 mg Intravenous Q6H  . pyridostigmine  30 mg Per Tube 3 times per day  . simvastatin  20 mg Oral QHS  . sodium chloride  3 mL Intravenous Q12H  . Tbo-Filgrastim  480 mcg Subcutaneous Daily    ASSESSMENT/PLAN:  81 y/o with marked clinical improvement today. Likely metabolic encephalopathy. Continue current management. No further neuro intervention required at this moment. Will sign off.  Dorian Pod, MD [facility] [phone]  05/16/2015, 8:24 AM

## 2015-05-16 NOTE — Progress Notes (Deleted)
Patient Name: Nicholas [name] Date of Encounter: 05/16/2015  Principal Problem:   SOB (shortness of breath) Active Problems:   HYPERCHOLESTEROLEMIA   Essential hypertension   GERD   Personal history of malignant neoplasm of rectum, rectosigmoid junction, and anus   Hx of CABG   BPH (benign prostatic hyperplasia)   Coronary artery disease   Atrial fibrillation with slow ventricular response   Acute on chronic systolic HF (heart failure)   CKD (chronic kidney disease) stage 4, GFR 24-09 ml/min   Systolic CHF, chronic   Pancytopenia   Symptomatic anemia   Myasthenia gravis   Anemia   Protein-calorie malnutrition, severe   Acute respiratory failure with hypoxia   Encephalopathy acute   Acute on chronic renal failure   Respiratory failure   Length of Stay: 2  SUBJECTIVE  The patient is intubated and sedated.  CURRENT MEDS . antiseptic oral rinse  7 mL Mouth Rinse QID  . B-complex with vitamin C   Oral Daily  . ceFEPime (MAXIPIME) IV  2 g Intravenous Q12H  . chlorhexidine  15 mL Mouth Rinse BID  . famotidine (PEPCID) IV  20 mg Intravenous Q24H  . fentaNYL (SUBLIMAZE) injection  50 mcg Intravenous Once  . ferrous sulfate  220 mg Per Tube BID WC  . hydrocortisone sodium succinate  50 mg Intravenous Q6H  . pyridostigmine  30 mg Per Tube 3 times per day  . simvastatin  20 mg Oral QHS  . sodium chloride  3 mL Intravenous Q12H  . Tbo-Filgrastim  480 mcg Subcutaneous Daily   . dextrose 5 % and 0.45% NaCl 50 mL/hr at 05/16/15 0744  . DOBUTamine 5 mcg/kg/min (05/16/15 0227)  . feeding supplement (VITAL AF 1.2 CAL) Stopped (05/16/15 0800)  . fentaNYL infusion INTRAVENOUS 50 mcg/hr (05/16/15 1046)  . norepinephrine (LEVOPHED) Adult infusion 12 mcg/min (05/16/15 1002)  . dialysis replacement fluid (prismasate)    . dialysis replacement fluid (prismasate)    . dialysate (PRISMASATE)    .  sodium bicarbonate  infusion 1000 mL 75 mL/hr at 05/16/15 0521   OBJECTIVE  Filed  Vitals:   05/16/15 0930 05/16/15 0932 05/16/15 0945 05/16/15 1000  BP: 115/58 124/60    Pulse: 71 61 70 64  Temp:  98.5 F (36.9 C)    TempSrc:  Oral    Resp: 20 20 20 20   Height:      Weight:      SpO2: 100% 100% 100% 100%    Intake/Output Summary (Last 24 hours) at 05/16/15 1107 Last data filed at 05/16/15 0927  Gross per 24 hour  Intake 4857.16 ml  Output    920 ml  Net 3937.16 ml   Filed Weights   05/14/15 0524 05/15/15 0500 05/16/15 0435  Weight: 154 lb 8.7 oz (70.1 kg) 155 lb 3.3 oz (70.4 kg) 158 lb 8.2 oz (71.9 kg)   PHYSICAL EXAM  General: Pleasant, NAD. Neuro: Alert and oriented X 3. Moves all extremities spontaneously. Psych: Normal affect. HEENT:  Normal  Neck: Supple without bruits or JVD. Lungs:  Resp regular and unlabored, CTA. Heart: RRR no s3, s4, or murmurs. Abdomen: Soft, non-tender, non-distended, BS + x 4.  Extremities: No clubbing, cyanosis or edema. DP/PT/Radials 2+ and equal bilaterally.  Accessory Clinical Findings  CBC  Recent Labs  05/12/2015 2348  05/15/15 0245 05/16/15 0431  WBC 0.1*  < > 0.3* 0.1*  NEUTROABS 0.0*  --   --  0.0*  HGB 6.7*  < > 9.9*  8.7*  HCT 20.8*  < > 30.3* 25.6*  MCV 102.0*  < > 96.5 94.1  PLT 8*  < > 38* <5*  < > = values in this interval not displayed. Basic Metabolic Panel  Recent Labs  05/15/15 1215 05/15/15 1745 05/15/15 1950 05/16/15 0431 05/16/15 0823  NA  --  137  --  137  --   K  --  3.5  --  3.9  --   CL  --  100*  --  98*  --   CO2  --  18*  --  21*  --   GLUCOSE  --  258*  --  102*  --   BUN  --  67*  --  72*  --   CREATININE  --  4.61*  --  4.88*  --   CALCIUM  --  7.7*  --  7.3*  --   MG 1.9  --  1.7  --   --   PHOS 6.9*  --  4.6  --  5.3*   Liver Function Tests  Recent Labs  05/15/15 1215  AST 155*  ALT 81*  ALKPHOS 30*  BILITOT 5.6*  PROT 5.2*  ALBUMIN 2.3*   Cardiac Enzymes  Recent Labs  05/15/15 1515 05/15/15 1950 05/16/15 0215  TROPONINI 1.62* 2.31* 3.07*    Radiology/Studies  Ct Chest Wo Contrast  04/16/2015   CLINICAL DATA:  Shortness of breath with exertion.  EXAM: CT CHEST WITHOUT CONTRAST  TECHNIQUE: Multidetector CT imaging of the chest was performed following the standard protocol without IV contrast.  COMPARISON:  CT scan of October 16, 2012.  FINDINGS: No pneumothorax or pleural effusion is noted. No acute pulmonary disease is noted. Status post coronary artery bypass graft. No evidence of thoracic aortic aneurysm is noted. Atherosclerotic calcifications of thoracic aorta are noted. No mediastinal mass or adenopathy is noted. No significant abnormality seen in the visualized portion of the upper abdomen. Status post left shoulder arthroplasty. No other significant abnormality seen within the visualized skeleton.  IMPRESSION: No acute cardiopulmonary abnormality seen.   Electronically Signed   By: Marijo Conception,   Dg Chest Port 1 View  05/15/2015   CLINICAL DATA:  Shortness of Breath  EXAM: PORTABLE CHEST - 1 VIEW  COMPARISON:  05/02/2015  FINDINGS: Cardiac shadow remains enlarged. Pacing device and postsurgical changes are again seen. The lungs are well aerated bilaterally. Mild increased vascular congestion is seen as well as small bilateral pleural effusions. No focal infiltrate is noted.  IMPRESSION: Mild vascular congestion and small pleural effusions.   Electronically Signed   By: Inez Catalina M.D.   On: 05/15/2015 07:45   TELE: a-fib, rate controlled    ASSESSMENT AND PLAN  Principal Problem:  SOB (shortness of breath) Active Problems:  HYPERCHOLESTEROLEMIA  Essential hypertension  GERD  Personal history of malignant neoplasm of rectum, rectosigmoid junction, and anus  Hx of CABG  BPH (benign prostatic hyperplasia)  Coronary artery disease  Atrial fibrillation with slow ventricular response  Acute on chronic systolic HF (heart failure)  CKD (chronic kidney disease) stage 4, GFR 42-68 ml/min  Systolic CHF,  chronic  Pancytopenia  Symptomatic anemia  Myasthenia gravis  Anemia  Protein-calorie malnutrition, severe  Acute respiratory failure with hypoxia  Encephalopathy acute  Acute on chronic renal failure  Respiratory failure  1. Acute on chronic systolic failure - Presented with SOB 5/18. He was doing well when last seen by Dr. Mare Ferrari 03/05/15. 2D echo  5/18 showed EF of 20-25%, severe hypokinesis of basal mid inferior and inferoseptal myocardium, EF was 40-45% in 10/15. Pt reported 5lb weight gain recently with worsening SOB. Given IV lasix 40mg  and 40mg  Po yesterday. Net I/O positive 1.9L so far. Yesterday he has 159ml urine output. Nothing today. I am concern for acute kidney failure. CXR today showed bilateral pleural effusions likely due to IV fluid and in setting of acute illness. Hold lasix for now.   2. Permenent atrial fibrillation - CHADSVASc score is at least 5. (age 77, CHF 1, HTN 1, CAD 1). On Coumadin at home. Coumadin was held on admission due to unclear etiology of pancytopenia. He has CRT therapy 05/12/15. Pacemaker noted.   3. Elevated troponin hx of CAD s/p CABG 1993 - Magnesium normal. EKG pending. Trop trend 0.40-->0.84. - Will wait for ischemic workup now, monitor with cycle trop and EKG. Reported no chest pain on admission.   4. Pancytopenia -Per primary  5. CKD stage IV - Baseline creatinine 2.6-3.0. His creatinine was 2.82 on 05/24/2015 today 4.63. Also only 100 ml output yesterday and nothing today. Needs further evaluation by nephrology.   6. Acute encephalopathy - Head CT showed no acute intracranial abnormality. Stable atrophy and chronic white matter disease. Cannot have MRI due to pacemaker. Further management per primary.  7. Acute respiratory failure with hypoxemia  - intubated now, full vent support, per primary   8. Hypotension - Held BP meds now given low BP. He was given Levofed to support BP.   Gravely ill appearing 81 year old male with  extensive cardiac history post bypass in 1993 with biventricular pacemaker, atrial fibrillation, history of rectal cancer with chronic stage IV kidney disease now with acute respiratory failure, acute renal failure and echocardiogram demonstrating reduction in ejection fraction to 20-25% from prior value of 40-45%. Also has thrombocytopenia and myasthenia gravis on chronic immunosuppression.  Agree with withholding traditional medication such as beta blocker, ACE inhibitor and other antihypertensives especially in the setting of shock. Recent discontinuation of the last hour of norepinephrine. Trying to maintain adequate map to provide renal perfusion. Holding Lasix currently. If creatinine begins to return to baseline, consider reinitiation of IV Lasix. JVD noted.  Continue with supportive care. Once over this acute illness, could consider further ischemic evaluation. Mild elevation in troponin likely secondary to demand ischemia in the setting of shock/hypotension.  Signed, Dorothy Spark MD, [facility] 05/16/2015   The patient was seen, examined and discussed with Almyra Deforest, PA-C and I agree with the above.   81 year old male with acute on chronic heart failure, respiratory failure and acute on CKD. The patient is intubated, being started on HD as he is oliguric.  He has h/o CABG in 1993, currently with elevated troponin with max 3.07 -> 2.49. Unfortunately, we wont be able to do any invasive measures with the current situation - acute renal failure, severe thrombocytopenia. The patient is in persistent a-fib, on coumadin at ome, we wont be able to use heparin as he is severely thrombocytopenic. Ischemic work up once he improves.  Unable to use any HF medications as he is under pressor support. No ASA. Continue simvastatin.  His baseline weight is 142 lbs, currently 158.   Dorothy Spark 05/16/2015

## 2015-05-16 NOTE — Progress Notes (Signed)
IP PROGRESS NOTE  Subjective:   He remains intubated on the ventilator.  Objective: Vital signs in last 24 hours: Blood pressure 124/48, pulse 89, temperature 98 F (36.7 C), temperature source Oral, resp. rate 20, height $RemoveBe'5\' 10"'RocIXOhvn$  (1.778 m), weight 158 lb 8.2 oz (71.9 kg), SpO2 100 %.  Intake/Output from previous day: 05/19 0701 - 05/20 0700 In: 4545.5 [I.V.:2535.5; NG/GT:410; IV Piggyback:1100] Out: 920 [Urine:25; Emesis/NG output:820; Stool:75]  Physical Exam:   Abdomen: Soft, no splenomegaly, left lower quadrant colostomy Extremities: No leg edema Skin: Scattered ecchymoses Neurologic: Alert, follows commands, moves extremities    Lab Results:  Recent Labs  05/15/15 0245 05/16/15 0431  WBC 0.3* 0.1*  HGB 9.9* 8.7*  HCT 30.3* 25.6*  PLT 38* PENDING    BMET  Recent Labs  05/15/15 1745 05/16/15 0431  NA 137 137  K 3.5 3.9  CL 100* 98*  CO2 18* 21*  GLUCOSE 258* 102*  BUN 67* 72*  CREATININE 4.61* 4.88*  CALCIUM 7.7* 7.3*   Peripheral blood smear 05/14/2015: Rare lymphocyte, the platelets are markedly decreased in number. Ovalocytes, acanthocytes, and burr cells. The polychromasia is not increased.   Studies/Results: Ct Head Wo Contrast  05/15/2015   CLINICAL DATA:  Decreased platelet count, possible CVA or brain bleed  EXAM: CT HEAD WITHOUT CONTRAST  TECHNIQUE: Contiguous axial images were obtained from the base of the skull through the vertex without intravenous contrast.  COMPARISON:  09/27/2014  FINDINGS: No skull fracture is noted. Stable postsurgical changes right maxillary sinus. The mastoid air cells are unremarkable.  No intracranial hemorrhage, mass effect or midline shift. Stable cerebral atrophy. Stable periventricular and patchy subcortical chronic white matter disease. Stable cerebellar atrophy. No acute cortical infarction. No mass lesion is noted on this unenhanced scan.  IMPRESSION: No acute intracranial abnormality. Stable atrophy and chronic  white matter disease.   Electronically Signed   By: Lahoma Crocker M.D.   On: 05/15/2015 10:34   Portable Chest Xray  05/16/2015   CLINICAL DATA:  Respiratory failure.  EXAM: PORTABLE CHEST - 1 VIEW  COMPARISON:  05/15/2015 .  FINDINGS: Endotracheal tube, NG tube, right IJ line stable position. Prior CABG. Cardiac pacer stable position. Stable cardiomegaly. Mild bilateral pulmonary interstitial prominence and pleural effusions. Findings consistent with mild congestive heart failure . Left shoulder replacement.  IMPRESSION: 1. Lines and tubes in stable position. 2. Prior CABG. Cardiac pacer stable position. Cardiomegaly with bilateral pulmonary interstitial prominence and small pleural effusions consistent congestive heart failure   Electronically Signed   By: [name]  Register   On: 05/16/2015 07:20   Dg Chest Port 1 View  05/15/2015   CLINICAL DATA:  Acute respiratory failure with hypoxia. Tube repositioned. Check placement.  EXAM: PORTABLE CHEST - 1 VIEW  COMPARISON:  05/15/2015  FINDINGS: Endotracheal tube is in place, 4.3 cm above carina. Right IJ central line tip overlies the superior vena cava. Nasogastric tube tip is off the film but beyond the gastroesophageal junction. Left-sided transvenous pacemaker leads overlying the right ventricle and coronary sinus.  Heart is mildly enlarged. There is minimal right lower lobe opacity, improved possibly related to positioning. Suspect right pleural scratched a suspect bilateral pleural effusions. There is pulmonary vascular congestion.  IMPRESSION: 1. Repositioned endotracheal tube. 2. Bilateral pleural effusions.   Electronically Signed   By: Nolon Nations M.D.   On: 05/15/2015 13:11   Dg Chest Port 1 View  05/15/2015   CLINICAL DATA:  Central line placement.  EXAM: PORTABLE CHEST -  1 VIEW  COMPARISON:  Same day.  FINDINGS: Stable cardiomediastinal silhouette. Stable bibasilar opacities are noted concerning for atelectasis or edema with mild associated pleural  effusions. No definite pneumothorax is noted. Left-sided pacemaker is unchanged in position. Status post coronary artery bypass graft. Interval placement of right internal jugular catheter line with distal tip overlying expected position of the SVC. Endotracheal tube has advanced with distal tip less than 1 cm above the carina. Nasogastric tube tip is seen entering stomach.  IMPRESSION: Interval placement of right internal jugular catheter with distal tip overlying expected position of the SVC. No pneumothorax is noted. Stable bibasilar opacities are noted concerning for atelectasis or edema with associated mild pleural effusions.  Endotracheal tube is now seen with distal tip less than 1 cm above the carina. Withdrawal by 2-3 cm is recommended. Critical Value/emergent results were called by telephone at the time of interpretation on 05/15/2015 at 9:15 am to Rosine Door, the patient's nurse, who verbally acknowledged these results.   Electronically Signed   By: Marijo Conception, M.D.   On: 05/15/2015 09:15   Dg Chest Port 1 View  05/15/2015   CLINICAL DATA:  Status post endotracheal tube and nasogastric catheter placement  EXAM: PORTABLE CHEST - 1 VIEW  COMPARISON:  05/15/2015  FINDINGS: Cardiomegaly is again noted. Postsurgical changes are again seen. A new endotracheal tube and nasogastric catheter are noted in satisfactory position. The endotracheal tube is approximately 6.1 cm above the carina. The degree of aeration has improved although suggestion of small effusions are again noted. The degree of vascular congestion has also improved.  IMPRESSION: Improved aeration with suggestion of small pleural effusions.  Endotracheal tube and nasogastric catheter in satisfactory position.   Electronically Signed   By: Inez Catalina M.D.   On: 05/15/2015 08:07   Dg Chest Port 1 View  05/15/2015   CLINICAL DATA:  Shortness of Breath  EXAM: PORTABLE CHEST - 1 VIEW  COMPARISON:  04/30/2015  FINDINGS: Cardiac shadow  remains enlarged. Pacing device and postsurgical changes are again seen. The lungs are well aerated bilaterally. Mild increased vascular congestion is seen as well as small bilateral pleural effusions. No focal infiltrate is noted.  IMPRESSION: Mild vascular congestion and small pleural effusions.   Electronically Signed   By: Inez Catalina M.D.   On: 05/15/2015 07:45    Medications: I have reviewed the patient's current medications.  Assessment/Plan:  1. Severe pancytopenia-most likely related to Imuran therapy, status post red cell and platelet transfusions during this hospital admission  2. Chronic anemia secondary to renal insufficiency versus myelodysplasia  3. Chronic/acute renal failure  4. Myasthenia gravis-maintained on Imuran prior to hospital admission  5. Atrial fibrillation  6. Acute altered mental status 05/15/2015 secondary to sepsis syndrome, improved  7. Congestive heart failure  8. Fever/hypotension-systemic infection versus cardiogenic shock  His mental status appears improved this morning. He has progressive renal failure and persistent pancytopenia. Blood cultures are negative to date.  Recommendations: 1. Hold Imuran 2. Transfuse platelets for a count of less than 20,000 or bleeding 3. Continue G-CSF and IV antibiotics 4. Management of hypotension/respiratory failure/renal failure per critical care medicine and nephrology 5. Bone marrow biopsy if the pancytopenia does not improve while off of Imuran  Please call hematology as needed over the weekend. I will check on him 05/19/2015   LOS: 2 days   Roseanna Koplin  05/16/2015, 8:17 AM

## 2015-05-16 NOTE — Progress Notes (Signed)
[facility] Progress Note Patient Name: KELVIN SENNETT DOB: 03/12/1934 MRN: [id number]   Date of Service  05/16/2015  HPI/Events of Note  Lactic Acid at 12:15 PM = 5.8 --> 7.8 at 5:45 PM. Current BP = 124/102. Suspect that Lactic Acid has continued to clear.  eICU Interventions  Will check Lactic Acid level now.      Intervention Category Major Interventions: Acid-Base disturbance - evaluation and management  Sommer,Steven Eugene 05/16/2015, 12:09 AM

## 2015-05-16 NOTE — Progress Notes (Signed)
CRITICAL VALUE ALERT  Critical value received: Lactic Acid 7.6  Date of notification: 05/16/15  Time of notification:  0025  Critical value read back:Yes.    Nurse who received alert:  [place] Carbo  MD notified (1st page):  Dr. Oletta Darter  Time of first page:  0025   Responding MD:  Dr. Oletta Darter  Time MD responded:  [phone]

## 2015-05-16 NOTE — Progress Notes (Signed)
Subjective: Interval History: has complaints wants tube out.  Objective: Vital signs in last 24 hours: Temp:  [97.8 F (36.6 C)-98.9 F (37.2 C)] 98.9 F (37.2 C) (05/20 0345) Pulse Rate:  [25-220] 56 (05/20 0645) Resp:  [15-27] 20 (05/20 0645) BP: (52-152)/(30-113) 93/44 mmHg (05/20 0645) SpO2:  [72 %-100 %] 97 % (05/20 0645) FiO2 (%):  [50 %-100 %] 50 % (05/20 0400) Weight:  [71.9 kg (158 lb 8.2 oz)] 71.9 kg (158 lb 8.2 oz) (05/20 0435) Weight change: 1.5 kg (3 lb 4.9 oz)  Intake/Output from previous day: 05/19 0701 - 05/20 0700 In: 4545.5 [I.V.:2535.5; NG/GT:410; IV Piggyback:1100] Out: 920 [Urine:25; Emesis/NG output:820; Stool:75] Intake/Output this shift:    General appearance: cooperative and aggitated Back: symmetric, no curvature. ROM normal. No CVA tenderness., rales in bases, scattered rhonchi Cardio: irregularly irregular rhythm GI: pos bs, soft Extremities: edema 1+  Lab Results:  Recent Labs  05/15/15 0245 05/16/15 0431  WBC 0.3* 0.1*  HGB 9.9* 8.7*  HCT 30.3* 25.6*  PLT 38* PENDING   BMET:  Recent Labs  05/15/15 1745 05/16/15 0431  NA 137 137  K 3.5 3.9  CL 100* 98*  CO2 18* 21*  GLUCOSE 258* 102*  BUN 67* 72*  CREATININE 4.61* 4.88*  CALCIUM 7.7* 7.3*   No results for input(s): PTH in the last 72 hours. Iron Studies:  Recent Labs  05/15/15 0245  IRON 105  TIBC 168*  FERRITIN 2277*    Studies/Results: Ct Head Wo Contrast  05/15/2015   CLINICAL DATA:  Decreased platelet count, possible CVA or brain bleed  EXAM: CT HEAD WITHOUT CONTRAST  TECHNIQUE: Contiguous axial images were obtained from the base of the skull through the vertex without intravenous contrast.  COMPARISON:  09/27/2014  FINDINGS: No skull fracture is noted. Stable postsurgical changes right maxillary sinus. The mastoid air cells are unremarkable.  No intracranial hemorrhage, mass effect or midline shift. Stable cerebral atrophy. Stable periventricular and patchy  subcortical chronic white matter disease. Stable cerebellar atrophy. No acute cortical infarction. No mass lesion is noted on this unenhanced scan.  IMPRESSION: No acute intracranial abnormality. Stable atrophy and chronic white matter disease.   Electronically Signed   By: Lahoma Crocker M.D.   On: 05/15/2015 10:34   Dg Chest Port 1 View  05/15/2015   CLINICAL DATA:  Acute respiratory failure with hypoxia. Tube repositioned. Check placement.  EXAM: PORTABLE CHEST - 1 VIEW  COMPARISON:  05/15/2015  FINDINGS: Endotracheal tube is in place, 4.3 cm above carina. Right IJ central line tip overlies the superior vena cava. Nasogastric tube tip is off the film but beyond the gastroesophageal junction. Left-sided transvenous pacemaker leads overlying the right ventricle and coronary sinus.  Heart is mildly enlarged. There is minimal right lower lobe opacity, improved possibly related to positioning. Suspect right pleural scratched a suspect bilateral pleural effusions. There is pulmonary vascular congestion.  IMPRESSION: 1. Repositioned endotracheal tube. 2. Bilateral pleural effusions.   Electronically Signed   By: Nolon Nations M.D.   On: 05/15/2015 13:11   Dg Chest Port 1 View  05/15/2015   CLINICAL DATA:  Central line placement.  EXAM: PORTABLE CHEST - 1 VIEW  COMPARISON:  Same day.  FINDINGS: Stable cardiomediastinal silhouette. Stable bibasilar opacities are noted concerning for atelectasis or edema with mild associated pleural effusions. No definite pneumothorax is noted. Left-sided pacemaker is unchanged in position. Status post coronary artery bypass graft. Interval placement of right internal jugular catheter line with distal tip  overlying expected position of the SVC. Endotracheal tube has advanced with distal tip less than 1 cm above the carina. Nasogastric tube tip is seen entering stomach.  IMPRESSION: Interval placement of right internal jugular catheter with distal tip overlying expected position of the  SVC. No pneumothorax is noted. Stable bibasilar opacities are noted concerning for atelectasis or edema with associated mild pleural effusions.  Endotracheal tube is now seen with distal tip less than 1 cm above the carina. Withdrawal by 2-3 cm is recommended. Critical Value/emergent results were called by telephone at the time of interpretation on 05/15/2015 at 9:15 am to Rosine Door, the patient's nurse, who verbally acknowledged these results.   Electronically Signed   By: Marijo Conception, M.D.   On: 05/15/2015 09:15   Dg Chest Port 1 View  05/15/2015   CLINICAL DATA:  Status post endotracheal tube and nasogastric catheter placement  EXAM: PORTABLE CHEST - 1 VIEW  COMPARISON:  05/15/2015  FINDINGS: Cardiomegaly is again noted. Postsurgical changes are again seen. A new endotracheal tube and nasogastric catheter are noted in satisfactory position. The endotracheal tube is approximately 6.1 cm above the carina. The degree of aeration has improved although suggestion of small effusions are again noted. The degree of vascular congestion has also improved.  IMPRESSION: Improved aeration with suggestion of small pleural effusions.  Endotracheal tube and nasogastric catheter in satisfactory position.   Electronically Signed   By: Inez Catalina M.D.   On: 05/15/2015 08:07   Dg Chest Port 1 View  05/15/2015   CLINICAL DATA:  Shortness of Breath  EXAM: PORTABLE CHEST - 1 VIEW  COMPARISON:  05/25/2015  FINDINGS: Cardiac shadow remains enlarged. Pacing device and postsurgical changes are again seen. The lungs are well aerated bilaterally. Mild increased vascular congestion is seen as well as small bilateral pleural effusions. No focal infiltrate is noted.  IMPRESSION: Mild vascular congestion and small pleural effusions.   Electronically Signed   By: Inez Catalina M.D.   On: 05/15/2015 07:45    I have reviewed the patient's current medications.  Assessment/Plan: 1 CKD4 2 AKI oliguric , AGMA, vol xs needs to start  CRRT soon . Will do electively. BP low, on pressors 3 CM ishcemic demand and suspect CAD issues 4 Pancytopenia per Heme 5 MG xs Imuran 6 Afib rate controlled 7 CAD  8 CHF 9DJD 10 probable sepsis P CRRT, no hep , vent, av   LOS: 2 days   Butler Vegh L 05/16/2015,7:04 AM

## 2015-05-16 NOTE — Progress Notes (Signed)
Notified Dr. Lake Bells of pt's high residuals >550. Per MD put tube feedings on hold until later today after CRRT has been running for a few hours. Will continue to monitor.

## 2015-05-17 ENCOUNTER — Inpatient Hospital Stay (HOSPITAL_COMMUNITY): Payer: Medicare Other

## 2015-05-17 DIAGNOSIS — I429 Cardiomyopathy, unspecified: Secondary | ICD-10-CM | POA: Insufficient documentation

## 2015-05-17 DIAGNOSIS — J9600 Acute respiratory failure, unspecified whether with hypoxia or hypercapnia: Secondary | ICD-10-CM | POA: Insufficient documentation

## 2015-05-17 LAB — COMPREHENSIVE METABOLIC PANEL
ALK PHOS: 24 U/L — AB (ref 38–126)
ALT: 332 U/L — AB (ref 17–63)
ANION GAP: 9 (ref 5–15)
AST: 482 U/L — AB (ref 15–41)
Albumin: 1.7 g/dL — ABNORMAL LOW (ref 3.5–5.0)
BILIRUBIN TOTAL: 8.1 mg/dL — AB (ref 0.3–1.2)
BUN: 37 mg/dL — ABNORMAL HIGH (ref 6–20)
CALCIUM: 7.5 mg/dL — AB (ref 8.9–10.3)
CO2: 25 mmol/L (ref 22–32)
Chloride: 103 mmol/L (ref 101–111)
Creatinine, Ser: 2.03 mg/dL — ABNORMAL HIGH (ref 0.61–1.24)
GFR calc non Af Amer: 30 mL/min — ABNORMAL LOW (ref >60)
GFR, EST AFRICAN AMERICAN: 34 mL/min — AB (ref >60)
Glucose, Bld: 125 mg/dL — ABNORMAL HIGH (ref 65–99)
Potassium: 3.8 mmol/L (ref 3.5–5.1)
SODIUM: 137 mmol/L (ref 135–145)
Total Protein: 4.4 g/dL — ABNORMAL LOW (ref 6.5–8.1)

## 2015-05-17 LAB — CBC WITH DIFFERENTIAL/PLATELET
BASOS ABS: 0 10*3/uL (ref 0.0–0.1)
Band Neutrophils: 0 % (ref 0–10)
Basophils Relative: 0 % (ref 0–1)
Blasts: 0 %
EOS ABS: 0 10*3/uL (ref 0.0–0.7)
Eosinophils Relative: 12 % — ABNORMAL HIGH (ref 0–5)
HCT: 26.5 % — ABNORMAL LOW (ref 39.0–52.0)
Hemoglobin: 9.3 g/dL — ABNORMAL LOW (ref 13.0–17.0)
LYMPHS ABS: 0.2 10*3/uL — AB (ref 0.7–4.0)
LYMPHS PCT: 80 % — AB (ref 12–46)
MCH: 31.6 pg (ref 26.0–34.0)
MCHC: 35.1 g/dL (ref 30.0–36.0)
MCV: 90.1 fL (ref 78.0–100.0)
MONOS PCT: 4 % (ref 3–12)
Metamyelocytes Relative: 0 %
Monocytes Absolute: 0 10*3/uL — ABNORMAL LOW (ref 0.1–1.0)
Myelocytes: 0 %
NEUTROS PCT: 4 % — AB (ref 43–77)
Neutro Abs: 0 10*3/uL — ABNORMAL LOW (ref 1.7–7.7)
Other: 0 %
PROMYELOCYTES ABS: 0 %
Platelets: 9 10*3/uL — CL (ref 150–400)
RBC: 2.94 MIL/uL — ABNORMAL LOW (ref 4.22–5.81)
RDW: 16.1 % — AB (ref 11.5–15.5)
WBC: 0.2 10*3/uL — CL (ref 4.0–10.5)
nRBC: 0 /100 WBC

## 2015-05-17 LAB — GLUCOSE, CAPILLARY
GLUCOSE-CAPILLARY: 121 mg/dL — AB (ref 65–99)
GLUCOSE-CAPILLARY: 136 mg/dL — AB (ref 65–99)
Glucose-Capillary: 159 mg/dL — ABNORMAL HIGH (ref 65–99)
Glucose-Capillary: 129 mg/dL — ABNORMAL HIGH (ref 65–99)
Glucose-Capillary: 86 mg/dL (ref 65–99)

## 2015-05-17 LAB — PREPARE PLATELET PHERESIS: Unit division: 0

## 2015-05-17 LAB — CARBOXYHEMOGLOBIN
CARBOXYHEMOGLOBIN: 1.1 % (ref 0.5–1.5)
METHEMOGLOBIN: 0.7 % (ref 0.0–1.5)
O2 SAT: 59.4 %
Total hemoglobin: 9.3 g/dL — ABNORMAL LOW (ref 13.5–18.0)

## 2015-05-17 LAB — PHOSPHORUS
PHOSPHORUS: 2.4 mg/dL — AB (ref 2.5–4.6)
Phosphorus: 2.4 mg/dL — ABNORMAL LOW (ref 2.5–4.6)

## 2015-05-17 LAB — RENAL FUNCTION PANEL
ALBUMIN: 1.8 g/dL — AB (ref 3.5–5.0)
Anion gap: 13 (ref 5–15)
BUN: 28 mg/dL — ABNORMAL HIGH (ref 6–20)
CO2: 21 mmol/L — AB (ref 22–32)
Calcium: 7.5 mg/dL — ABNORMAL LOW (ref 8.9–10.3)
Chloride: 102 mmol/L (ref 101–111)
Creatinine, Ser: 1.5 mg/dL — ABNORMAL HIGH (ref 0.61–1.24)
GFR calc Af Amer: 50 mL/min — ABNORMAL LOW (ref >60)
GFR, EST NON AFRICAN AMERICAN: 43 mL/min — AB (ref >60)
GLUCOSE: 143 mg/dL — AB (ref 65–99)
Phosphorus: 3.1 mg/dL (ref 2.5–4.6)
Potassium: 4.1 mmol/L (ref 3.5–5.1)
Sodium: 136 mmol/L (ref 135–145)

## 2015-05-17 LAB — MAGNESIUM: Magnesium: 2 mg/dL (ref 1.7–2.4)

## 2015-05-17 LAB — TROPONIN I
Troponin I: 0.93 ng/mL (ref <0.031)
Troponin I: 0.71 ng/mL (ref <0.031)

## 2015-05-17 LAB — APTT: aPTT: 46 seconds — ABNORMAL HIGH (ref 24–37)

## 2015-05-17 MED ORDER — AMIODARONE HCL IN DEXTROSE 360-4.14 MG/200ML-% IV SOLN
30.0000 mg/h | INTRAVENOUS | Status: DC
  Administered 2015-05-17 – 2015-05-20 (×6): 30 mg/h via INTRAVENOUS
  Filled 2015-05-17 (×12): qty 200

## 2015-05-17 MED ORDER — DEXTROSE 5 % IV SOLN
20.0000 mmol | Freq: Once | INTRAVENOUS | Status: AC
  Administered 2015-05-17: 20 mmol via INTRAVENOUS
  Filled 2015-05-17: qty 6.67

## 2015-05-17 MED ORDER — AMIODARONE HCL IN DEXTROSE 360-4.14 MG/200ML-% IV SOLN
INTRAVENOUS | Status: AC
  Administered 2015-05-17: 60 mg/h via INTRAVENOUS
  Filled 2015-05-17: qty 200

## 2015-05-17 MED ORDER — AMIODARONE LOAD VIA INFUSION
150.0000 mg | Freq: Once | INTRAVENOUS | Status: AC
  Administered 2015-05-17: 150 mg via INTRAVENOUS
  Filled 2015-05-17: qty 83.34

## 2015-05-17 MED ORDER — AMIODARONE HCL IN DEXTROSE 360-4.14 MG/200ML-% IV SOLN
60.0000 mg/h | INTRAVENOUS | Status: AC
  Administered 2015-05-17 (×2): 60 mg/h via INTRAVENOUS
  Filled 2015-05-17: qty 200

## 2015-05-17 MED ORDER — SIMVASTATIN 20 MG PO TABS
20.0000 mg | ORAL_TABLET | Freq: Every day | ORAL | Status: DC
  Administered 2015-05-17: 20 mg
  Filled 2015-05-17 (×2): qty 1

## 2015-05-17 MED ORDER — ACETAMINOPHEN 325 MG PO TABS: 650.0000 mg | ORAL_TABLET | ORAL | Status: DC | PRN

## 2015-05-17 NOTE — Progress Notes (Signed)
Pt's wife requested pt's dentures be removed.  With the assistance of another RT.  Pt's dentures were removed and give to wife.  Pt tolerated well, ETT was secured back at prior placement.  RN made aware.

## 2015-05-17 NOTE — Progress Notes (Signed)
PULMONARY / CRITICAL CARE MEDICINE   Name: Nicholas [name] MRN: [id number] DOB: [birth year]    ADMISSION DATE:  05/14/2015 CONSULTATION DATE:  05/15/2015  REFERRING MD :  Tana Coast  CHIEF COMPLAINT:  Acute confusion  INITIAL PRESENTATION: 81 y/o male admitted on 5/18 to [facility] with pancytopenia and dyspnea.  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  STUDIES:  5/19 CT head >> NAICP 5/18 ECHO >> LVEF 20-25%, inferior WMA's, mild AI, mild MR, LAE, RAE  SIGNIFICANT EVENTS: 5/19  Early AM became unresponsive, required intubation 5/20  Worsening shock, lactic acidosis, started on BICAB GTT, neurology, cardiology and nephrology consulted 5/21  Remains on CVVHD, goal net neg 50    SUBJECTIVE:  RN reports hypothermia overnight, bair hugger applied.  Remains on CVVHD - Nephrology increased goal to net neg 50 per hr.  Dobutamine gtt off, on amiodarone.  Levophed at 9 mcg.  Tx 1 unit PRBC's overnight >> co-ox post transfusion went from 65 to 59 (off dobutamine)  VITAL SIGNS: Temp:  [91.3 F (32.9 C)-98.1 F (36.7 C)] 98 F (36.7 C) (05/21 0818) Pulse Rate:  [32-155] 71 (05/21 0923) Resp:  [18-22] 20 (05/21 0923) BP: (91-151)/(49-92) 93/49 mmHg (05/21 0923) SpO2:  [99 %-100 %] 99 % (05/21 0923) Arterial Line BP: (98-156)/(41-63) 113/53 mmHg (05/21 0715) FiO2 (%):  [30 %-50 %] 30 % (05/21 0923) Weight:  [159 lb 13.3 oz (72.5 kg)] 159 lb 13.3 oz (72.5 kg) (05/21 0419)   HEMODYNAMICS: CVP:  [8 mmHg] 8 mmHg   VENTILATOR SETTINGS: Vent Mode:  [-] PRVC FiO2 (%):  [30 %-50 %] 30 % Set Rate:  [20 bmp] 20 bmp Vt Set:  [580 mL] 580 mL PEEP:  [5 cmH20] 5 cmH20 Plateau Pressure:  [18 cmH20-20 cmH20] 18 cmH20   INTAKE / OUTPUT:  Intake/Output Summary (Last 24 hours) at 05/17/15 1133 Last data filed at 05/17/15 1000  Gross per 24 hour  Intake 2361.68 ml  Output   2356 ml  Net   5.68 ml    PHYSICAL EXAMINATION: General:  Frail, chronically ill male in NAD on vent  Neuro:  Awake on vent,  interactive HEENT:  NCAT, OP clear Cardiovascular:  s1s2 rrr, no m/r/g, no jvd Lungs:  CTA B GI: BS+, soft, non tender Musculoskeletal:  Decreased bulk, wasting  Skin:  No rash or skin breakdown  LABS:  CBC  Recent Labs Lab 05/16/15 0431 05/16/15 1600 05/17/15 0359  WBC 0.1* 0.2* 0.2*  HGB 8.7* 9.0* 9.3*  HCT 25.6* 25.9* 26.5*  PLT <5* 9* 9*   Coag's  Recent Labs Lab 05/11/2015 2348 05/15/15 1215 05/17/15 0359  APTT  --   --  46*  INR 1.64* 2.27*  --    BMET  Recent Labs Lab 05/16/15 0431 05/16/15 1600 05/17/15 0359  NA 137 136 137  K 3.9 3.6 3.8  CL 98* 99* 103  CO2 21* 23 25  BUN 72* 59* 37*  CREATININE 4.88* 3.50* 2.03*  GLUCOSE 102* 146* 125*   Electrolytes  Recent Labs Lab 05/15/15 1215  05/15/15 1950 05/16/15 0431  05/16/15 1600 05/16/15 2015 05/17/15 0359 05/17/15 0815  CALCIUM  --   < >  --  7.3*  --  7.1*  --  7.5*  --   MG 1.9  --  1.7  --   --   --   --  2.0  --   PHOS 6.9*  --  4.6  --   < > 4.0  3.2 2.4* 2.4*  < > = values in this interval not displayed.   Sepsis Markers  Recent Labs Lab 05/15/15 1745 05/16/15 0025 05/16/15 0430  LATICACIDVEN 7.8* 7.6* 6.6*   ABG  Recent Labs Lab 05/15/15 1050  PHART 7.305*  PCO2ART 30.0*  PO2ART 444*   Liver Enzymes  Recent Labs Lab 05/15/15 1215 05/16/15 1600 05/17/15 0359  AST 155*  --  482*  ALT 81*  --  332*  ALKPHOS 30*  --  24*  BILITOT 5.6*  --  8.1*  ALBUMIN 2.3* 1.9* 1.7*   Cardiac Enzymes  Recent Labs Lab 05/16/15 1600 05/17/15 0105 05/17/15 0815  TROPONINI 1.65* 0.93* 0.71*   Glucose  Recent Labs Lab 05/16/15 1155 05/16/15 1548 05/16/15 2026 05/16/15 2324 05/17/15 0348 05/17/15 0816  GLUCAP 116* 94 133* 138* 121* 159*    Imaging  5/21 CXR >> bilateral pleural effusions, bibasilar atx    ASSESSMENT / PLAN:  NEUROLOGIC A:   Acute encephalopathy - improved, CT head negative  Myasthenia - does not appear to be flaring right now P:   RASS  goal: 0 to -1 Neurology consult appreciated  Fentanyl gtt  Hold meds that would exacerbate myasthenia Continue mestinon  PULMONARY OETT 5/19 >> A: Acute respiratory failure with hypoxemia due to CHF exacerbation, DDX includes HCAP, was febrile 5/18 Pleural Effusions  Basilar ATX P:   MV support, 8 cc/kg Daily assessment for SBT, not candidate 5/21 Trend CXR  See ID Volume removal per Nephrology   CARDIOVASCULAR CVL 5/19 >> A:  Acute decompensated CHF - LVEF 20-25% which is new with new WMA's, ddx includes ischemia but EKG non-specific on admission Demand ischemia Cardiogenic shock - complicated by possible sepsis and anemia, currently afebrile  Afib  P:  Appreciate Cardiology Input May need CHF SVC input  Not currently a cath candidate (drop in EF) Titrate levophed for MAP >65 Continue amiodarone   RENAL A:   Acute on chronic renal insufficiency - oliguric 5/20, CVVHD started.   P:   Appreciate renal CVVHD per Nephrology  Goal net neg 50 ml /hr  GASTROINTESTINAL A:   Abnormal LFTs with elevated direct bili > no clear source of hepatic congestion, may be related to heart failure P:   OG tube Pepcid for SUP Continue tube feedings RUQ ultrasound >>  HEMATOLOGIC A:   Acute pancytopenia - on admission, most likely due to Imuran in setting of renal failure Neutropenia P:  No imuran Monitor for bleeding Daily CBC Transfuse 1 U PRBC due to ischemia, cardiogenic shock  INFECTIOUS A:   Neutropenic fever with infiltrates, could be HCAP but favor pulm edema as cause of CXR abnormalities P:   BCx2 5/19 >> Sputum 5/19 >>  Cefepime 5/19 >>  Monitor culture closely, hold vanc due to myasthenia  ENDOCRINE A:   Hypoglycemia  P:   Monitor glucose closely   FAMILY  - Updates: Wife updated at bedside.      Noe Gens, NP-C [facility] Pgr: [phone] or [phone] 05/17/2015, 11:33 AM   PCCM ATTENDING: I have reviewed pt's initial  presentation, consultants notes and hospital database in detail.  The above assessment and plan was formulated under my direction.  In summary: VDRF CXR: CM, bilat effusions Severe cardiomyopathy Remains on vasopressors Remains on amiodarone Cardiology involved AKI Remains on CRRT Renal managing HD GNRs in resp cx Remains on cefepime Pancytopenia - severe tcp, leukopenia  Likely due to azathioprine Plan as above Family updated  in detail  40 minutes of independent CCM time was provided by me   Merton Border, MD;  PCCM service; Mobile [phone]

## 2015-05-17 NOTE — Progress Notes (Signed)
Patient Name: Nicholas [name] Date of Encounter: 05/17/2015  Primary Cardiologist: Dr. Mare Ferrari   Principal Problem:   SOB (shortness of breath) Active Problems:   HYPERCHOLESTEROLEMIA   Essential hypertension   GERD   Personal history of malignant neoplasm of rectum, rectosigmoid junction, and anus   Hx of CABG   BPH (benign prostatic hyperplasia)   Coronary artery disease   Atrial fibrillation with slow ventricular response   Acute on chronic systolic HF (heart failure)   CKD (chronic kidney disease) stage 4, GFR 40-08 ml/min   Systolic CHF, chronic   Pancytopenia   Symptomatic anemia   Myasthenia gravis   Anemia   Protein-calorie malnutrition, severe   Acute respiratory failure with hypoxia   Encephalopathy acute   Acute on chronic renal failure   Respiratory failure   NSTEMI (non-ST elevated myocardial infarction)    SUBJECTIVE  Intubated and sedated. On pressor. Per nurse, starting dialysis today  CURRENT MEDS . antiseptic oral rinse  7 mL Mouth Rinse QID  . ceFEPime (MAXIPIME) IV  2 g Intravenous Q12H  . chlorhexidine  15 mL Mouth Rinse BID  . famotidine (PEPCID) IV  20 mg Intravenous Q24H  . fentaNYL (SUBLIMAZE) injection  50 mcg Intravenous Once  . ferrous sulfate  220 mg Per Tube BID WC  . hydrocortisone sodium succinate  50 mg Intravenous Q6H  . pyridostigmine  30 mg Per Tube 3 times per day  . simvastatin  20 mg Per Tube QHS  . sodium chloride  3 mL Intravenous Q12H  . sodium phosphate  Dextrose 5% IVPB  20 mmol Intravenous Once  . Tbo-Filgrastim  480 mcg Subcutaneous Daily    OBJECTIVE  Filed Vitals:   05/17/15 1000 05/17/15 1100 05/17/15 1200 05/17/15 1203  BP: 125/72 86/49 119/46   Pulse: 73 70 70   Temp:    96 F (35.6 C)  TempSrc:    Other (Comment)  Resp: 20 20 20    Height:      Weight:      SpO2: 100% 100% 100%     Intake/Output Summary (Last 24 hours) at 05/17/15 1431 Last data filed at 05/17/15 1400  Gross per 24 hour  Intake  2129.18 ml  Output   2825 ml  Net -695.82 ml   Filed Weights   05/15/15 0500 05/16/15 0435 05/17/15 0419  Weight: 70.4 kg (155 lb 3.3 oz) 71.9 kg (158 lb 8.2 oz) 72.5 kg (159 lb 13.3 oz)    PHYSICAL EXAM  General: Intubated, sedated HEENT:  Normal  Neck: +JVD Lungs: on vent, anterior exam CTA. Heart: regularly irregular. no s3, s4, or murmurs. Abdomen: Soft, non-distended, BS + x 4.  Extremities: SCD in place.   Accessory Clinical Findings  CBC  Recent Labs  05/16/15 0431 05/16/15 1600 05/17/15 0359  WBC 0.1* 0.2* 0.2*  NEUTROABS 0.0*  --  0.0*  HGB 8.7* 9.0* 9.3*  HCT 25.6* 25.9* 26.5*  MCV 94.1 89.9 90.1  PLT <5* 9* 9*   Basic Metabolic Panel  Recent Labs  05/15/15 1950  05/16/15 1600  05/17/15 0359 05/17/15 0815  NA  --   < > 136  --  137  --   K  --   < > 3.6  --  3.8  --   CL  --   < > 99*  --  103  --   CO2  --   < > 23  --  25  --   GLUCOSE  --   < >  146*  --  125*  --   BUN  --   < > 59*  --  37*  --   CREATININE  --   < > 3.50*  --  2.03*  --   CALCIUM  --   < > 7.1*  --  7.5*  --   MG 1.7  --   --   --  2.0  --   PHOS 4.6  < > 4.0  < > 2.4* 2.4*  < > = values in this interval not displayed. Liver Function Tests  Recent Labs  05/15/15 1215 05/16/15 1600 05/17/15 0359  AST 155*  --  482*  ALT 81*  --  332*  ALKPHOS 30*  --  24*  BILITOT 5.6*  --  8.1*  PROT 5.2*  --  4.4*  ALBUMIN 2.3* 1.9* 1.7*   Cardiac Enzymes  Recent Labs  05/16/15 1600 05/17/15 0105 05/17/15 0815  TROPONINI 1.65* 0.93* 0.71*    TELE A-fib with ventricular pacing, HR 60s    ECG  A-fib ventricularlly paced  Echocardiogram 05/14/2015  LV EF: 20% -  25%  ------------------------------------------------------------------- Indications:   CHF - 428.0.  ------------------------------------------------------------------- History:  PMH:  Syncope and dyspnea. Risk  factors: Hypertension.  ------------------------------------------------------------------- Study Conclusions  - Left ventricle: The cavity size was normal. Wall thickness was increased in a pattern of mild LVH. Systolic function was severely reduced. The estimated ejection fraction was in the range of 20% to 25%. There is severe hypokinesis of the basal-midinferior and inferoseptal myocardium. - Aortic valve: There was mild regurgitation. - Mitral valve: Calcified annulus. Mildly thickened leaflets . There was mild regurgitation. - Left atrium: The atrium was mildly dilated. - Right atrium: The atrium was mildly dilated.    Radiology/Studies  Dg Chest 2 View  05/27/2015   CLINICAL DATA:  Shortness of breath for 1 day.  EXAM: CHEST  2 VIEW  COMPARISON:  Chest CT 04/16/2015  FINDINGS: Patient is post median sternotomy and CABG. Dual lead left-sided pacemaker remains in place. Mild cardiomegaly and tortuosity of the thoracic aorta. No pulmonary edema. The lungs are hyperinflated. Minimal blunting of the right costophrenic angle. No confluent airspace disease. No pneumothorax. Left proximal humerus prosthesis in place. No acute osseous abnormalities are seen.  IMPRESSION: 1.  No acute pulmonary process. 2. Hyperinflation. Minimal blunting of right costophrenic angle, may be related to hyperinflation versus small pleural effusion.   Electronically Signed   By: Jeb Levering M.D.   On: 05/05/2015 22:48   Ct Head Wo Contrast  05/15/2015   CLINICAL DATA:  Decreased platelet count, possible CVA or brain bleed  EXAM: CT HEAD WITHOUT CONTRAST  TECHNIQUE: Contiguous axial images were obtained from the base of the skull through the vertex without intravenous contrast.  COMPARISON:  09/27/2014  FINDINGS: No skull fracture is noted. Stable postsurgical changes right maxillary sinus. The mastoid air cells are unremarkable.  No intracranial hemorrhage, mass effect or midline shift. Stable  cerebral atrophy. Stable periventricular and patchy subcortical chronic white matter disease. Stable cerebellar atrophy. No acute cortical infarction. No mass lesion is noted on this unenhanced scan.  IMPRESSION: No acute intracranial abnormality. Stable atrophy and chronic white matter disease.   Electronically Signed   By: Lahoma Crocker M.D.   On: 05/15/2015 10:34   Dg Chest Port 1 View  05/17/2015   CLINICAL DATA:  Acute respiratory failure with hypoxia. Shortness of breath.  EXAM: PORTABLE CHEST - 1 VIEW  COMPARISON:  Yesterday.  FINDINGS: Endotracheal tube tip 2.6 cm above the carina. Significant patient rotation to the right with probable decreased enlargement of the cardiac silhouette. Probable bilateral posteriorly layering pleural effusions with ill-defined increased density in the lower lung zones. Stable pacemaker leads, left humeral head prosthesis and right shoulder degenerative changes.  IMPRESSION: 1. Increased bilateral pleural fluid and associated bibasilar atelectasis. 2. Mildly improved cardiomegaly.   Electronically Signed   By: Claudie Revering M.D.   On: 05/17/2015 09:47   Portable Chest Xray  05/16/2015   CLINICAL DATA:  Respiratory failure.  EXAM: PORTABLE CHEST - 1 VIEW  COMPARISON:  05/15/2015 .  FINDINGS: Endotracheal tube, NG tube, right IJ line stable position. Prior CABG. Cardiac pacer stable position. Stable cardiomegaly. Mild bilateral pulmonary interstitial prominence and pleural effusions. Findings consistent with mild congestive heart failure . Left shoulder replacement.  IMPRESSION: 1. Lines and tubes in stable position. 2. Prior CABG. Cardiac pacer stable position. Cardiomegaly with bilateral pulmonary interstitial prominence and small pleural effusions consistent congestive heart failure   Electronically Signed   By: [name]  Register   On: 05/16/2015 07:20   Dg Chest Port 1 View  05/15/2015   CLINICAL DATA:  Acute respiratory failure with hypoxia. Tube repositioned. Check  placement.  EXAM: PORTABLE CHEST - 1 VIEW  COMPARISON:  05/15/2015  FINDINGS: Endotracheal tube is in place, 4.3 cm above carina. Right IJ central line tip overlies the superior vena cava. Nasogastric tube tip is off the film but beyond the gastroesophageal junction. Left-sided transvenous pacemaker leads overlying the right ventricle and coronary sinus.  Heart is mildly enlarged. There is minimal right lower lobe opacity, improved possibly related to positioning. Suspect right pleural scratched a suspect bilateral pleural effusions. There is pulmonary vascular congestion.  IMPRESSION: 1. Repositioned endotracheal tube. 2. Bilateral pleural effusions.   Electronically Signed   By: Nolon Nations M.D.   On: 05/15/2015 13:11   Dg Chest Port 1 View  05/15/2015   CLINICAL DATA:  Central line placement.  EXAM: PORTABLE CHEST - 1 VIEW  COMPARISON:  Same day.  FINDINGS: Stable cardiomediastinal silhouette. Stable bibasilar opacities are noted concerning for atelectasis or edema with mild associated pleural effusions. No definite pneumothorax is noted. Left-sided pacemaker is unchanged in position. Status post coronary artery bypass graft. Interval placement of right internal jugular catheter line with distal tip overlying expected position of the SVC. Endotracheal tube has advanced with distal tip less than 1 cm above the carina. Nasogastric tube tip is seen entering stomach.  IMPRESSION: Interval placement of right internal jugular catheter with distal tip overlying expected position of the SVC. No pneumothorax is noted. Stable bibasilar opacities are noted concerning for atelectasis or edema with associated mild pleural effusions.  Endotracheal tube is now seen with distal tip less than 1 cm above the carina. Withdrawal by 2-3 cm is recommended. Critical Value/emergent results were called by telephone at the time of interpretation on 05/15/2015 at 9:15 am to Rosine Door, the patient's nurse, who verbally  acknowledged these results.   Electronically Signed   By: Marijo Conception, M.D.   On: 05/15/2015 09:15   Dg Chest Port 1 View  05/15/2015   CLINICAL DATA:  Status post endotracheal tube and nasogastric catheter placement  EXAM: PORTABLE CHEST - 1 VIEW  COMPARISON:  05/15/2015  FINDINGS: Cardiomegaly is again noted. Postsurgical changes are again seen. A new endotracheal tube and nasogastric catheter are noted in satisfactory position. The endotracheal tube is approximately 6.1 cm  above the carina. The degree of aeration has improved although suggestion of small effusions are again noted. The degree of vascular congestion has also improved.  IMPRESSION: Improved aeration with suggestion of small pleural effusions.  Endotracheal tube and nasogastric catheter in satisfactory position.   Electronically Signed   By: Inez Catalina M.D.   On: 05/15/2015 08:07   Dg Chest Port 1 View  05/15/2015   CLINICAL DATA:  Shortness of Breath  EXAM: PORTABLE CHEST - 1 VIEW  COMPARISON:  05/22/2015  FINDINGS: Cardiac shadow remains enlarged. Pacing device and postsurgical changes are again seen. The lungs are well aerated bilaterally. Mild increased vascular congestion is seen as well as small bilateral pleural effusions. No focal infiltrate is noted.  IMPRESSION: Mild vascular congestion and small pleural effusions.   Electronically Signed   By: Inez Catalina M.D.   On: 05/15/2015 07:45   US Abdomen Limited Ruq  05/16/2015   CLINICAL DATA:  Abnormal LFTs  EXAM: US ABDOMEN LIMITED - RIGHT UPPER QUADRANT  COMPARISON:  None.  FINDINGS: Gallbladder:  Layering gallbladder sludge possible tiny gallstones. Gallbladder is underdistended with secondary mild gallbladder wall thickening. Negative sonographic Murphy's sign.  Common bile duct:  Diameter: 2 mm  Liver:  No focal lesion identified. Within normal limits in parenchymal echogenicity.  Additional comments:  Ascites.  Right pleural effusion.  IMPRESSION: Gallbladder sludge with  possible tiny gallstones.  No sonographic findings to suggest acute cholecystitis.  Ascites.  Right pleural effusion.   Electronically Signed   By: Julian Hy M.D.   On: 05/16/2015 12:28    ASSESSMENT AND PLAN  1. Acute on chronic systolic failure  - EF 13-08% in 09/2014  - Echo 5/18 showed LV EF of 20-25%, mild LVH, severe hypokinesis of the basal-mid inferior and inferoseptal myocardium, mild aortic reg, and mild mitral regur  - although has significant drop in EF, not currently a cath candidate given renal failure  . Continue maintenance support, not much to offer at this time from cardiac perspective, will followup with his progress.  He is currently on CVVH.   2. Permanent atrial fibrillation   - CHADSVASc score is at least 5. (age 72, CHF 1, HTN 1, CAD 1). On Coumadin at home, stopped with thrombocytopenia  3. Elevated troponin: trop 1.62 --> 2.31 -- >3.07.    - likely a combination of shock and underlying ischemia. No a good invasive workup candidate given failure renal function.   4. hx of CAD s/p CABG 1993    Blase Beckner, Wonda Cheng, MD  05/17/2015 2:34 PM    [place] [place],  [place], [place]  [address] Pager [phone] Phone: [phone]; Fax: [phone]   [facility]  [address] [place], [place]  [address] [phone]    Fax [phone]

## 2015-05-17 NOTE — Progress Notes (Signed)
[facility] Progress Note Patient Name: Nicholas [name] DOB: 1934-11-03 MRN: [id number]   Date of Service  05/17/2015  HPI/Events of Note  Arrythmia - Wide complex with rate = 150's. DDx: VT vs AFIB/AFlutter with aberrancy. Favor the latter.    eICU Interventions  Will order: 1. Amiodarone IV bolus and infusion.  2. Send AM labs early.  3. Hold Dobutamine IV infusion.   Note patient broke into a paced rhythm with rate in the 70's during the Amiodarone bolus.  Cardiology service informed and will see patient first thing in AM.      Intervention Category Major Interventions: Arrhythmia - evaluation and management  Sommer,Steven Eugene 05/17/2015, 4:10 AM

## 2015-05-17 NOTE — Progress Notes (Signed)
[facility] Progress Note Patient Name: KENTRAVIOUS LIPFORD DOB: Oct 30, 1934 MRN: [id number]   Date of Service  05/17/2015  HPI/Events of Note  Called by nurse for elevated HR and hypotension.  Patient noted to have wide complex tachycardia.  Nurse stopped fentanyl and increased levophed with improvement in BP.  Patient spontaneously converted.  Patient on amiodarone drip.  Unclear to me whether this rhythm was Vtach or afib with aberrant conduction and rvr.  eICU Interventions  Nurse to contact cardiology as they are following the patient Continue amiodarone Wean levophed as able     Intervention Category Major Interventions: Arrhythmia - evaluation and management  Mauri Brooklyn, P 05/17/2015, 6:49 PM

## 2015-05-17 NOTE — Progress Notes (Signed)
At about 0345 pt went into a wide complex rate to the 160's. Pt continued to responds to voice and follow commands. [facility] MD called and made aware. 12 lead EKG done. AM labs sent early.  Dobutamine drip stopped per [facility] MD. New orders for Amiodarone with bolus. Amiodarone given at 0415.  Spoke to on call cardiologist and made him aware of situation.   Pt broke rhythm at about 0415.  Will continue to monitor closely.

## 2015-05-17 NOTE — Progress Notes (Signed)
Subjective: Interval History: has no complaint , wakes up but not obey commands.  Objective: Vital signs in last 24 hours: Temp:  [91.3 F (32.9 C)-98.6 F (37 C)] 97.4 F (36.3 C) (05/21 0350) Pulse Rate:  [32-155] 71 (05/21 0715) Resp:  [18-22] 20 (05/21 0715) BP: (91-151)/(49-99) 114/59 mmHg (05/21 0600) SpO2:  [100 %] 100 % (05/21 0715) Arterial Line BP: (98-181)/(41-63) 113/53 mmHg (05/21 0715) FiO2 (%):  [30 %-100 %] 30 % (05/21 0446) Weight:  [72.5 kg (159 lb 13.3 oz)] 72.5 kg (159 lb 13.3 oz) (05/21 0419) Weight change: 0.6 kg (1 lb 5.2 oz)  Intake/Output from previous day: 05/20 0701 - 05/21 0700 In: 3423.3 [I.V.:2465.3; Blood:693; NG/GT:115; IV Piggyback:150] Out: 2121 [Urine:45] Intake/Output this shift:    General appearance: cachectic, pale, slowed mentation and awakens but not coop Neck: IJ cath Resp: diminished breath sounds bilaterally and rales bibasilar Cardio: paced ,reg in 70s GI: soft, non-tender; bowel sounds normal; no masses,  no organomegaly Extremities: edema 1-2+ pre sacral  Ostomy L mid abdm  Lab Results:  Recent Labs  05/16/15 1600 05/17/15 0359  WBC 0.2* 0.2*  HGB 9.0* 9.3*  HCT 25.9* 26.5*  PLT 9* 9*   BMET:  Recent Labs  05/16/15 1600 05/17/15 0359  NA 136 137  K 3.6 3.8  CL 99* 103  CO2 23 25  GLUCOSE 146* 125*  BUN 59* 37*  CREATININE 3.50* 2.03*  CALCIUM 7.1* 7.5*   No results for input(s): PTH in the last 72 hours. Iron Studies:  Recent Labs  05/15/15 0245  IRON 105  TIBC 168*  FERRITIN 2277*    Studies/Results: Ct Head Wo Contrast  05/15/2015   CLINICAL DATA:  Decreased platelet count, possible CVA or brain bleed  EXAM: CT HEAD WITHOUT CONTRAST  TECHNIQUE: Contiguous axial images were obtained from the base of the skull through the vertex without intravenous contrast.  COMPARISON:  09/27/2014  FINDINGS: No skull fracture is noted. Stable postsurgical changes right maxillary sinus. The mastoid air cells are  unremarkable.  No intracranial hemorrhage, mass effect or midline shift. Stable cerebral atrophy. Stable periventricular and patchy subcortical chronic white matter disease. Stable cerebellar atrophy. No acute cortical infarction. No mass lesion is noted on this unenhanced scan.  IMPRESSION: No acute intracranial abnormality. Stable atrophy and chronic white matter disease.   Electronically Signed   By: Lahoma Crocker M.D.   On: 05/15/2015 10:34   Portable Chest Xray  05/16/2015   CLINICAL DATA:  Respiratory failure.  EXAM: PORTABLE CHEST - 1 VIEW  COMPARISON:  05/15/2015 .  FINDINGS: Endotracheal tube, NG tube, right IJ line stable position. Prior CABG. Cardiac pacer stable position. Stable cardiomegaly. Mild bilateral pulmonary interstitial prominence and pleural effusions. Findings consistent with mild congestive heart failure . Left shoulder replacement.  IMPRESSION: 1. Lines and tubes in stable position. 2. Prior CABG. Cardiac pacer stable position. Cardiomegaly with bilateral pulmonary interstitial prominence and small pleural effusions consistent congestive heart failure   Electronically Signed   By: [name]  Register   On: 05/16/2015 07:20   Dg Chest Port 1 View  05/15/2015   CLINICAL DATA:  Acute respiratory failure with hypoxia. Tube repositioned. Check placement.  EXAM: PORTABLE CHEST - 1 VIEW  COMPARISON:  05/15/2015  FINDINGS: Endotracheal tube is in place, 4.3 cm above carina. Right IJ central line tip overlies the superior vena cava. Nasogastric tube tip is off the film but beyond the gastroesophageal junction. Left-sided transvenous pacemaker leads overlying the right ventricle  and coronary sinus.  Heart is mildly enlarged. There is minimal right lower lobe opacity, improved possibly related to positioning. Suspect right pleural scratched a suspect bilateral pleural effusions. There is pulmonary vascular congestion.  IMPRESSION: 1. Repositioned endotracheal tube. 2. Bilateral pleural effusions.    Electronically Signed   By: Nolon Nations M.D.   On: 05/15/2015 13:11   Dg Chest Port 1 View  05/15/2015   CLINICAL DATA:  Central line placement.  EXAM: PORTABLE CHEST - 1 VIEW  COMPARISON:  Same day.  FINDINGS: Stable cardiomediastinal silhouette. Stable bibasilar opacities are noted concerning for atelectasis or edema with mild associated pleural effusions. No definite pneumothorax is noted. Left-sided pacemaker is unchanged in position. Status post coronary artery bypass graft. Interval placement of right internal jugular catheter line with distal tip overlying expected position of the SVC. Endotracheal tube has advanced with distal tip less than 1 cm above the carina. Nasogastric tube tip is seen entering stomach.  IMPRESSION: Interval placement of right internal jugular catheter with distal tip overlying expected position of the SVC. No pneumothorax is noted. Stable bibasilar opacities are noted concerning for atelectasis or edema with associated mild pleural effusions.  Endotracheal tube is now seen with distal tip less than 1 cm above the carina. Withdrawal by 2-3 cm is recommended. Critical Value/emergent results were called by telephone at the time of interpretation on 05/15/2015 at 9:15 am to Rosine Door, the patient's nurse, who verbally acknowledged these results.   Electronically Signed   By: Marijo Conception, M.D.   On: 05/15/2015 09:15   Dg Chest Port 1 View  05/15/2015   CLINICAL DATA:  Status post endotracheal tube and nasogastric catheter placement  EXAM: PORTABLE CHEST - 1 VIEW  COMPARISON:  05/15/2015  FINDINGS: Cardiomegaly is again noted. Postsurgical changes are again seen. A new endotracheal tube and nasogastric catheter are noted in satisfactory position. The endotracheal tube is approximately 6.1 cm above the carina. The degree of aeration has improved although suggestion of small effusions are again noted. The degree of vascular congestion has also improved.  IMPRESSION: Improved  aeration with suggestion of small pleural effusions.  Endotracheal tube and nasogastric catheter in satisfactory position.   Electronically Signed   By: Inez Catalina M.D.   On: 05/15/2015 08:07   US Abdomen Limited Ruq  05/16/2015   CLINICAL DATA:  Abnormal LFTs  EXAM: US ABDOMEN LIMITED - RIGHT UPPER QUADRANT  COMPARISON:  None.  FINDINGS: Gallbladder:  Layering gallbladder sludge possible tiny gallstones. Gallbladder is underdistended with secondary mild gallbladder wall thickening. Negative sonographic Murphy's sign.  Common bile duct:  Diameter: 2 mm  Liver:  No focal lesion identified. Within normal limits in parenchymal echogenicity.  Additional comments:  Ascites.  Right pleural effusion.  IMPRESSION: Gallbladder sludge with possible tiny gallstones.  No sonographic findings to suggest acute cholecystitis.  Ascites.  Right pleural effusion.   Electronically Signed   By: Julian Hy M.D.   On: 05/16/2015 12:28    I have reviewed the patient's current medications.  Assessment/Plan: 1 AKI/CKD 4 oliguric, vol xs, good acid/base, solute, control , off anticoag, need to get rid of foley. 2 Low phos replete 3 Anemia stable 4 Pancytopenia on GCSF, still low 5 VDRF 6 Sepsis on AB 7 Afib rapid last pm K ok 8 CAD 9 Colon Ca 10 MG may be a limiting factore 11 bp on pressors but bp ok, weaning P lower vol, GCSF, AB, CRRt, Vent.    LOS:  3 days   Syesha Thaw L 05/17/2015,8:01 AM

## 2015-05-18 ENCOUNTER — Inpatient Hospital Stay (HOSPITAL_COMMUNITY): Payer: Medicare Other

## 2015-05-18 DIAGNOSIS — R0602 Shortness of breath: Secondary | ICD-10-CM

## 2015-05-18 DIAGNOSIS — I472 Ventricular tachycardia: Secondary | ICD-10-CM

## 2015-05-18 DIAGNOSIS — R579 Shock, unspecified: Secondary | ICD-10-CM

## 2015-05-18 DIAGNOSIS — R570 Cardiogenic shock: Secondary | ICD-10-CM

## 2015-05-18 LAB — RENAL FUNCTION PANEL
ANION GAP: 7 (ref 5–15)
Albumin: 1.7 g/dL — ABNORMAL LOW (ref 3.5–5.0)
Albumin: 1.6 g/dL — ABNORMAL LOW (ref 3.5–5.0)
Anion gap: 10 (ref 5–15)
BUN: 24 mg/dL — ABNORMAL HIGH (ref 6–20)
BUN: 20 mg/dL (ref 6–20)
CHLORIDE: 105 mmol/L (ref 101–111)
CO2: 24 mmol/L (ref 22–32)
CO2: 23 mmol/L (ref 22–32)
CREATININE: 1.21 mg/dL (ref 0.61–1.24)
Calcium: 7.7 mg/dL — ABNORMAL LOW (ref 8.9–10.3)
Calcium: 7.4 mg/dL — ABNORMAL LOW (ref 8.9–10.3)
Chloride: 101 mmol/L (ref 101–111)
Creatinine, Ser: 0.91 mg/dL (ref 0.61–1.24)
GFR calc Af Amer: >60 mL/min (ref >60)
GFR, EST NON AFRICAN AMERICAN: 56 mL/min — AB (ref >60)
Glucose, Bld: 136 mg/dL — ABNORMAL HIGH (ref 65–99)
Glucose, Bld: 163 mg/dL — ABNORMAL HIGH (ref 65–99)
Phosphorus: 2.7 mg/dL (ref 2.5–4.6)
Phosphorus: 4.4 mg/dL (ref 2.5–4.6)
Potassium: 4.2 mmol/L (ref 3.5–5.1)
Potassium: 4.2 mmol/L (ref 3.5–5.1)
SODIUM: 135 mmol/L (ref 135–145)
Sodium: 135 mmol/L (ref 135–145)

## 2015-05-18 LAB — GLUCOSE, CAPILLARY
GLUCOSE-CAPILLARY: 137 mg/dL — AB (ref 65–99)
GLUCOSE-CAPILLARY: 146 mg/dL — AB (ref 65–99)
Glucose-Capillary: 140 mg/dL — ABNORMAL HIGH (ref 65–99)
Glucose-Capillary: 130 mg/dL — ABNORMAL HIGH (ref 65–99)
Glucose-Capillary: 152 mg/dL — ABNORMAL HIGH (ref 65–99)
Glucose-Capillary: 123 mg/dL — ABNORMAL HIGH (ref 65–99)

## 2015-05-18 LAB — CBC
HCT: 28.6 % — ABNORMAL LOW (ref 39.0–52.0)
HEMOGLOBIN: 10.2 g/dL — AB (ref 13.0–17.0)
MCH: 31.5 pg (ref 26.0–34.0)
MCHC: 35.7 g/dL (ref 30.0–36.0)
MCV: 88.3 fL (ref 78.0–100.0)
RBC: 3.24 MIL/uL — AB (ref 4.22–5.81)
RDW: 15.4 % (ref 11.5–15.5)
WBC: 0.3 10*3/uL — CL (ref 4.0–10.5)

## 2015-05-18 LAB — TYPE AND SCREEN
ABO/RH(D): A POS
Antibody Screen: NEGATIVE
DONOR AG TYPE: NEGATIVE
Donor AG Type: NEGATIVE
Donor AG Type: NEGATIVE
UNIT DIVISION: 0
Unit division: 0
Unit division: 0

## 2015-05-18 LAB — CULTURE, RESPIRATORY: SPECIAL REQUESTS: NORMAL

## 2015-05-18 LAB — CARBOXYHEMOGLOBIN
Carboxyhemoglobin: 0.8 % (ref 0.5–1.5)
Methemoglobin: 0.7 % (ref 0.0–1.5)
O2 SAT: 56.8 %
Total hemoglobin: 12.7 g/dL — ABNORMAL LOW (ref 13.5–18.0)

## 2015-05-18 LAB — APTT: aPTT: 41 seconds — ABNORMAL HIGH (ref 24–37)

## 2015-05-18 LAB — CULTURE, RESPIRATORY W GRAM STAIN

## 2015-05-18 LAB — MAGNESIUM: Magnesium: 2.4 mg/dL (ref 1.7–2.4)

## 2015-05-18 MED ORDER — SODIUM PHOSPHATE 3 MMOLE/ML IV SOLN
20.0000 mmol | Freq: Once | INTRAVENOUS | Status: AC
  Administered 2015-05-18: 20 mmol via INTRAVENOUS
  Filled 2015-05-18: qty 6.67

## 2015-05-18 NOTE — Progress Notes (Signed)
PULMONARY / CRITICAL CARE MEDICINE   Name: Nicholas [name] MRN: [id number] DOB: 1934/05/30    ADMISSION DATE:  05/03/2015 CONSULTATION DATE:  05/15/2015  REFERRING MD :  Tana Coast  CHIEF COMPLAINT:  Acute confusion  INITIAL PRESENTATION: 81 y/o male admitted on 5/18 to [facility] with pancytopenia and dyspnea.  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  STUDIES:  5/19 CT head >> NAICP 5/18 ECHO >> LVEF 20-25%, inferior WMA's, mild AI, mild MR, LAE, RAE  SIGNIFICANT EVENTS: 5/19  Early AM became unresponsive, required intubation 5/20  Worsening shock, lactic acidosis, started on BICAB GTT, neurology, cardiology and nephrology consulted 5/21  Remains on CVVHD, goal net neg 50. Received 1 unit PRBC's   SUBJECTIVE:  RN reports episode of wide complex tachycardia with hypotension to 30's yesterday - resolved with amio.  On levo at 14 mcg with co-ox in 50's  VITAL SIGNS: Temp:  [94.6 F (34.8 C)-98.5 F (36.9 C)] 94.6 F (34.8 C) (05/22 0754) Pulse Rate:  [25-152] 73 (05/22 1100) Resp:  [16-42] 16 (05/22 1100) BP: (74-174)/(45-126) 147/115 mmHg (05/22 1100) SpO2:  [100 %] 100 % (05/22 1100) Arterial Line BP: (76-148)/(41-79) 115/59 mmHg (05/22 1100) FiO2 (%):  [30 %] 30 % (05/22 1100) Weight:  [157 lb 3 oz (71.3 kg)] 157 lb 3 oz (71.3 kg) (05/22 0415)   HEMODYNAMICS: CVP:  [5 mmHg-11 mmHg] 5 mmHg   VENTILATOR SETTINGS: Vent Mode:  [-] PRVC FiO2 (%):  [30 %] 30 % Set Rate:  [20 bmp] 20 bmp Vt Set:  [580 mL] 580 mL PEEP:  [5 cmH20] 5 cmH20 Plateau Pressure:  [18 cmH20-20 cmH20] 18 cmH20   INTAKE / OUTPUT:  Intake/Output Summary (Last 24 hours) at 05/18/15 1116 Last data filed at 05/18/15 1000  Gross per 24 hour  Intake 1549.88 ml  Output   2610 ml  Net -1060.12 ml    PHYSICAL EXAMINATION: General:  Frail, chronically ill male in NAD on vent  Neuro:  Awake on vent, interactive HEENT:  NCAT, OP clear Cardiovascular:  s1s2 rrr, no m/r/g, no jvd Lungs:  Even/non-labored,  lungs bilaterally coarse GI: BS+, soft, non tender Musculoskeletal:  Decreased bulk, wasting  Skin:  No rash or skin breakdown  LABS:  CBC  Recent Labs Lab 05/16/15 1600 05/17/15 0359 05/18/15 0515  WBC 0.2* 0.2* 0.3*  HGB 9.0* 9.3* 10.2*  HCT 25.9* 26.5* 28.6*  PLT 9* 9* <5*   Coag's  Recent Labs Lab 05/22/2015 2348 05/15/15 1215 05/17/15 0359 05/18/15 0515  APTT  --   --  46* 41*  INR 1.64* 2.27*  --   --    BMET  Recent Labs Lab 05/17/15 0359 05/17/15 1605 05/18/15 0515  NA 137 136 135  K 3.8 4.1 4.2  CL 103 102 101  CO2 25 21* 24  BUN 37* 28* 24*  CREATININE 2.03* 1.50* 1.21  GLUCOSE 125* 143* 136*   Electrolytes  Recent Labs Lab 05/15/15 1950  05/17/15 0359 05/17/15 0815 05/17/15 1605 05/18/15 0515  CALCIUM  --   < > 7.5*  --  7.5* 7.7*  MG 1.7  --  2.0  --   --  2.4  PHOS 4.6  < > 2.4* 2.4* 3.1 2.7  < > = values in this interval not displayed.   Sepsis Markers  Recent Labs Lab 05/15/15 1745 05/16/15 0025 05/16/15 0430  LATICACIDVEN 7.8* 7.6* 6.6*   ABG  Recent Labs Lab 05/15/15 1050  PHART 7.305*  PCO2ART  30.0*  PO2ART 444*   Liver Enzymes  Recent Labs Lab 05/15/15 1215  05/17/15 0359 05/17/15 1605 05/18/15 0515  AST 155*  --  482*  --   --   ALT 81*  --  332*  --   --   ALKPHOS 30*  --  24*  --   --   BILITOT 5.6*  --  8.1*  --   --   ALBUMIN 2.3*  < > 1.7* 1.8* 1.7*  < > = values in this interval not displayed.   Cardiac Enzymes  Recent Labs Lab 05/16/15 1600 05/17/15 0105 05/17/15 0815  TROPONINI 1.65* 0.93* 0.71*   Glucose  Recent Labs Lab 05/17/15 1206 05/17/15 1616 05/17/15 1939 05/17/15 2357 05/18/15 0409 05/18/15 0804  GLUCAP 136* 129* 86 137* 140* 130*    Imaging  5/22 CXR >> improved L effusion/atx, small R effusion    ASSESSMENT / PLAN:  NEUROLOGIC A:   Acute encephalopathy - improved, CT head negative  Myasthenia - does not appear to be flaring right now P:   RASS goal: 0 to  -1 Neurology consult appreciated  Fentanyl gtt  Hold meds that would exacerbate myasthenia Continue mestinon  PULMONARY OETT 5/19 >> A: Acute respiratory failure with hypoxemia due to CHF exacerbation, DDX includes HCAP, was febrile 5/18 Pleural Effusions  Basilar ATX P:   MV support, 8 cc/kg Daily assessment for SBT, not candidate 5/22 Trend CXR  See ID Volume status per Nephrology, goal net even 5/22  CARDIOVASCULAR CVL 5/19 >> A:  Acute decompensated CHF - LVEF 20-25% which is new with new WMA's, ddx includes ischemia but EKG non-specific on admission Demand ischemia Cardiogenic shock - complicated by possible sepsis and anemia, currently afebrile  Afib  P:  Appreciate Cardiology Input  Not currently a cath candidate (drop in EF) Titrate levophed for MAP >65 Continue amiodarone  LCB - no CPR, no defib, no code medications.  [place] for pressors.    RENAL A:   Acute on chronic renal insufficiency - oliguric 5/20, CVVHD started.   P:   Appreciate renal input CVVHD per Nephrology  Goal even balance  GASTROINTESTINAL A:   Abnormal LFTs with elevated direct bili > no clear source of hepatic congestion, may be related to heart failure P:   OG tube Pepcid for SUP Continue tube feedings RUQ ultrasound >>  HEMATOLOGIC A:   Acute pancytopenia - on admission, most likely due to Imuran in setting of renal failure.  S/P 1 unit PRBC 5/21 (shock) Neutropenia P:  No further imuran Monitor for bleeding Daily CBC  INFECTIOUS A:   Neutropenic fever with infiltrates, could be HCAP but favor pulm edema as cause of CXR abnormalities P:   BCx2 5/19 >> Sputum 5/19 >> klebsiella >> R- ampicillin, otherwise sens  Cefepime 5/19 >>  Monitor culture closely, hold vanc due to myasthenia  ENDOCRINE A:   Hypoglycemia  P:   Monitor glucose closely, wnl   FAMILY  - Updates: Wife & daughter updated at bedside.  They indicate he has been making statements that he "is tired and  doesn't want to keep going to the doctors".  He also had refused home health in the recent past.  He has had a prolonged course of illness and unfortunately is not making progress.  They do not want prolonged suffering for him but want to give him a reasonable chance of recovery.  In the event of cardiac arrest, they do not want CPR or defib.  At present, they want to continue current medical care but he does not make progress or declines (in the next 24-48 hours), they would be open to further discussion for comfort care.      Noe Gens, NP-C [facility] Pgr: [phone] or [phone] 05/18/2015, 11:16 AM    PCCM ATTENDING: I have reviewed pt's initial presentation, consultants notes and hospital database in detail.  The above assessment and plan was formulated under my direction.  In summary: VDRF - not ready for weaning CXR: CM, bilat effusions Severe cardiomyopathy Remains on vasopressors Remains on amiodarone Dobutamine stopped yesterday due to [facility] Cardiology involved AKI on CRRT. Renal managing HD Pansensitive Klebsiella in resp cx Remains on cefepime Pancytopenia likely due to azathioprine - very severe tcp, leukopenia Plan as above Family updated in detail. They have expressed desire for DNR if he arrests and will consider discontinuation of aggressive support in the next couple of days if he does not show evidence of recovery   40 minutes of independent CCM time was provided by me   Merton Border, MD;  PCCM service; Mobile [phone]

## 2015-05-18 NOTE — Progress Notes (Signed)
DR Levin Erp in to see pt at 2145 for continued sustained HR 148-160/ Afib Afl. No new orders given at this time. To continue to monitor closely. Pt converted to V-paced with rates in the 70's at 2330. Levophed being titrated down decrease in HR

## 2015-05-18 NOTE — Progress Notes (Signed)
Subjective: Interval History: has complaints wants tube out.  Objective: Vital signs in last 24 hours: Temp:  [94.6 F (34.8 C)-98.5 F (36.9 C)] 94.6 F (34.8 C) (05/22 0754) Pulse Rate:  [25-152] 76 (05/22 0749) Resp:  [18-42] 20 (05/22 0749) BP: (56-174)/(35-125) 108/54 mmHg (05/22 0749) SpO2:  [99 %-100 %] 100 % (05/22 0749) Arterial Line BP: (73-148)/(41-79) 116/57 mmHg (05/22 0715) FiO2 (%):  [30 %] 30 % (05/22 0749) Weight:  [71.3 kg (157 lb 3 oz)] 71.3 kg (157 lb 3 oz) (05/22 0415) Weight change: -1.2 kg (-2 lb 10.3 oz)  Intake/Output from previous day: 05/21 0701 - 05/22 0700 In: 1653.7 [I.V.:1248.7; NG/GT:40; IV Piggyback:365] Out: 2692  Intake/Output this shift:    General appearance: cooperative, pale and jaundiced Resp: rales bibasilar, rhonchi bibasilar and R>L Cardio: irregularly irregular rhythm and systolic murmur: holosystolic 2/6, blowing at apex GI: pos bs, soft,  Extremities: edema 2-3+  Lab Results:  Recent Labs  05/17/15 0359 05/18/15 0515  WBC 0.2* 0.3*  HGB 9.3* 10.2*  HCT 26.5* 28.6*  PLT 9* <5*   BMET:  Recent Labs  05/17/15 1605 05/18/15 0515  NA 136 135  K 4.1 4.2  CL 102 101  CO2 21* 24  GLUCOSE 143* 136*  BUN 28* 24*  CREATININE 1.50* 1.21  CALCIUM 7.5* 7.7*   No results for input(s): PTH in the last 72 hours. Iron Studies: No results for input(s): IRON, TIBC, TRANSFERRIN, FERRITIN in the last 72 hours.  Studies/Results: Dg Chest Port 1 View  05/17/2015   CLINICAL DATA:  Acute respiratory failure with hypoxia. Shortness of breath.  EXAM: PORTABLE CHEST - 1 VIEW  COMPARISON:  Yesterday.  FINDINGS: Endotracheal tube tip 2.6 cm above the carina. Significant patient rotation to the right with probable decreased enlargement of the cardiac silhouette. Probable bilateral posteriorly layering pleural effusions with ill-defined increased density in the lower lung zones. Stable pacemaker leads, left humeral head prosthesis and right  shoulder degenerative changes.  IMPRESSION: 1. Increased bilateral pleural fluid and associated bibasilar atelectasis. 2. Mildly improved cardiomegaly.   Electronically Signed   By: Claudie Revering M.D.   On: 05/17/2015 09:47   US Abdomen Limited Ruq  05/16/2015   CLINICAL DATA:  Abnormal LFTs  EXAM: US ABDOMEN LIMITED - RIGHT UPPER QUADRANT  COMPARISON:  None.  FINDINGS: Gallbladder:  Layering gallbladder sludge possible tiny gallstones. Gallbladder is underdistended with secondary mild gallbladder wall thickening. Negative sonographic Murphy's sign.  Common bile duct:  Diameter: 2 mm  Liver:  No focal lesion identified. Within normal limits in parenchymal echogenicity.  Additional comments:  Ascites.  Right pleural effusion.  IMPRESSION: Gallbladder sludge with possible tiny gallstones.  No sonographic findings to suggest acute cholecystitis.  Ascites.  Right pleural effusion.   Electronically Signed   By: Julian Hy M.D.   On: 05/16/2015 12:28    I have reviewed the patient's current medications.  Assessment/Plan: 1 CKD/AKI   Vol xs but very unstable cardiac status.  Will keep even.  e'lytes ok, good solute clearance.  2 Anemia 3 Pancytopenia  On GCSF 4 VDRF per CCM,  ? pneu 5 Sepsis 6 MG 7 ^ LFTs 8 HPTH 9 Arrhrythmias seems to be a limiting factor. 10 CM P even, keep e'lytes stable, GCSF, BSAB,      LOS: 4 days   Khalaya Mcgurn L 05/18/2015,7:59 AM

## 2015-05-18 NOTE — Progress Notes (Signed)
Patient Name: Nicholas [name] Date of Encounter: 05/18/2015  Primary Cardiologist: Dr. Mare Ferrari   Principal Problem:   SOB (shortness of breath) Active Problems:   HYPERCHOLESTEROLEMIA   Essential hypertension   GERD   Personal history of malignant neoplasm of rectum, rectosigmoid junction, and anus   Hx of CABG   BPH (benign prostatic hyperplasia)   Coronary artery disease   Atrial fibrillation with slow ventricular response   Acute on chronic systolic HF (heart failure)   CKD (chronic kidney disease) stage 4, GFR 21-30 ml/min   Systolic CHF, chronic   Pancytopenia   Symptomatic anemia   Myasthenia gravis   Anemia   Protein-calorie malnutrition, severe   Acute respiratory failure with hypoxia   Encephalopathy acute   Acute on chronic renal failure   Respiratory failure   NSTEMI (non-ST elevated myocardial infarction)   Acute respiratory failure   Cardiomyopathy    SUBJECTIVE  81 y/o male with myasthenia, systolic HF, CAD and renal failure. Baseline creatine ~3.0. Admitted with shock. Now on CVVHD.   Had several hour run of WCT in the 150s yesterday. Broke last night on amio. Suspect AFL. Remains intubated. Awaked. Now on levophed 14 and amio.   CURRENT MEDS . antiseptic oral rinse  7 mL Mouth Rinse QID  . ceFEPime (MAXIPIME) IV  2 g Intravenous Q12H  . chlorhexidine  15 mL Mouth Rinse BID  . famotidine (PEPCID) IV  20 mg Intravenous Q24H  . fentaNYL (SUBLIMAZE) injection  50 mcg Intravenous Once  . ferrous sulfate  220 mg Per Tube BID WC  . hydrocortisone sodium succinate  50 mg Intravenous Q6H  . pyridostigmine  30 mg Per Tube 3 times per day  . simvastatin  20 mg Per Tube QHS  . sodium chloride  3 mL Intravenous Q12H  . Tbo-Filgrastim  480 mcg Subcutaneous Daily    OBJECTIVE  Filed Vitals:   05/18/15 0700 05/18/15 0715 05/18/15 0749 05/18/15 0754  BP: 129/115  108/54   Pulse: 71 25 76   Temp:    94.6 F (34.8 C)  TempSrc:    Axillary  Resp: 20 20 20     Height:      Weight:      SpO2: 100% 100% 100%     Intake/Output Summary (Last 24 hours) at 05/18/15 0804 Last data filed at 05/18/15 0700  Gross per 24 hour  Intake 1551.98 ml  Output   2572 ml  Net -1020.02 ml   Filed Weights   05/16/15 0435 05/17/15 0419 05/18/15 0415  Weight: 71.9 kg (158 lb 8.2 oz) 72.5 kg (159 lb 13.3 oz) 71.3 kg (157 lb 3 oz)    PHYSICAL EXAM  General: Intubated. Frail ill-appearing HEENT:  Normal  Neck: jvp down Lungs: on vent, anterior exam CTA. Heart: regularly irregular. no s3, s4, or murmurs. Abdomen: Soft, non-distended, BS + x 4.  Extremities: SCD in place.   Accessory Clinical Findings  CBC  Recent Labs  05/16/15 0431  05/17/15 0359 05/18/15 0515  WBC 0.1*  < > 0.2* 0.3*  NEUTROABS 0.0*  --  0.0*  --   HGB 8.7*  < > 9.3* 10.2*  HCT 25.6*  < > 26.5* 28.6*  MCV 94.1  < > 90.1 88.3  PLT <5*  < > 9* <5*  < > = values in this interval not displayed. Basic Metabolic Panel  Recent Labs  05/17/15 0359  05/17/15 1605 05/18/15 0515  NA 137  --  136 135  K 3.8  --  4.1 4.2  CL 103  --  102 101  CO2 25  --  21* 24  GLUCOSE 125*  --  143* 136*  BUN 37*  --  28* 24*  CREATININE 2.03*  --  1.50* 1.21  CALCIUM 7.5*  --  7.5* 7.7*  MG 2.0  --   --  2.4  PHOS 2.4*  < > 3.1 2.7  < > = values in this interval not displayed. Liver Function Tests  Recent Labs  05/15/15 1215  05/17/15 0359 05/17/15 1605 05/18/15 0515  AST 155*  --  482*  --   --   ALT 81*  --  332*  --   --   ALKPHOS 30*  --  24*  --   --   BILITOT 5.6*  --  8.1*  --   --   PROT 5.2*  --  4.4*  --   --   ALBUMIN 2.3*  < > 1.7* 1.8* 1.7*  < > = values in this interval not displayed. Cardiac Enzymes  Recent Labs  05/16/15 1600 05/17/15 0105 05/17/15 0815  TROPONINI 1.65* 0.93* 0.71*    TELE A-fib with ventricular pacing, HR 60s    ECG  A-fib ventricularlly paced  Echocardiogram 05/14/2015  LV EF: 20% -   25%  ------------------------------------------------------------------- Indications:   CHF - 428.0.  ------------------------------------------------------------------- History:  PMH:  Syncope and dyspnea. Risk factors: Hypertension.  ------------------------------------------------------------------- Study Conclusions  - Left ventricle: The cavity size was normal. Wall thickness was increased in a pattern of mild LVH. Systolic function was severely reduced. The estimated ejection fraction was in the range of 20% to 25%. There is severe hypokinesis of the basal-midinferior and inferoseptal myocardium. - Aortic valve: There was mild regurgitation. - Mitral valve: Calcified annulus. Mildly thickened leaflets . There was mild regurgitation. - Left atrium: The atrium was mildly dilated. - Right atrium: The atrium was mildly dilated.    Radiology/Studies  Dg Chest 2 View  05/24/2015   CLINICAL DATA:  Shortness of breath for 1 day.  EXAM: CHEST  2 VIEW  COMPARISON:  Chest CT 04/16/2015  FINDINGS: Patient is post median sternotomy and CABG. Dual lead left-sided pacemaker remains in place. Mild cardiomegaly and tortuosity of the thoracic aorta. No pulmonary edema. The lungs are hyperinflated. Minimal blunting of the right costophrenic angle. No confluent airspace disease. No pneumothorax. Left proximal humerus prosthesis in place. No acute osseous abnormalities are seen.  IMPRESSION: 1.  No acute pulmonary process. 2. Hyperinflation. Minimal blunting of right costophrenic angle, may be related to hyperinflation versus small pleural effusion.   Electronically Signed   By: Jeb Levering M.D.   On: 05/19/2015 22:48   Ct Head Wo Contrast  05/15/2015   CLINICAL DATA:  Decreased platelet count, possible CVA or brain bleed  EXAM: CT HEAD WITHOUT CONTRAST  TECHNIQUE: Contiguous axial images were obtained from the base of the skull through the vertex without intravenous contrast.   COMPARISON:  09/27/2014  FINDINGS: No skull fracture is noted. Stable postsurgical changes right maxillary sinus. The mastoid air cells are unremarkable.  No intracranial hemorrhage, mass effect or midline shift. Stable cerebral atrophy. Stable periventricular and patchy subcortical chronic white matter disease. Stable cerebellar atrophy. No acute cortical infarction. No mass lesion is noted on this unenhanced scan.  IMPRESSION: No acute intracranial abnormality. Stable atrophy and chronic white matter disease.   Electronically Signed   By: Lahoma Crocker M.D.   On:  05/15/2015 10:34   Dg Chest Port 1 View  05/17/2015   CLINICAL DATA:  Acute respiratory failure with hypoxia. Shortness of breath.  EXAM: PORTABLE CHEST - 1 VIEW  COMPARISON:  Yesterday.  FINDINGS: Endotracheal tube tip 2.6 cm above the carina. Significant patient rotation to the right with probable decreased enlargement of the cardiac silhouette. Probable bilateral posteriorly layering pleural effusions with ill-defined increased density in the lower lung zones. Stable pacemaker leads, left humeral head prosthesis and right shoulder degenerative changes.  IMPRESSION: 1. Increased bilateral pleural fluid and associated bibasilar atelectasis. 2. Mildly improved cardiomegaly.   Electronically Signed   By: Claudie Revering M.D.   On: 05/17/2015 09:47   Portable Chest Xray  05/16/2015   CLINICAL DATA:  Respiratory failure.  EXAM: PORTABLE CHEST - 1 VIEW  COMPARISON:  05/15/2015 .  FINDINGS: Endotracheal tube, NG tube, right IJ line stable position. Prior CABG. Cardiac pacer stable position. Stable cardiomegaly. Mild bilateral pulmonary interstitial prominence and pleural effusions. Findings consistent with mild congestive heart failure . Left shoulder replacement.  IMPRESSION: 1. Lines and tubes in stable position. 2. Prior CABG. Cardiac pacer stable position. Cardiomegaly with bilateral pulmonary interstitial prominence and small pleural effusions consistent  congestive heart failure   Electronically Signed   By: [name]  Register   On: 05/16/2015 07:20   Dg Chest Port 1 View  05/15/2015   CLINICAL DATA:  Acute respiratory failure with hypoxia. Tube repositioned. Check placement.  EXAM: PORTABLE CHEST - 1 VIEW  COMPARISON:  05/15/2015  FINDINGS: Endotracheal tube is in place, 4.3 cm above carina. Right IJ central line tip overlies the superior vena cava. Nasogastric tube tip is off the film but beyond the gastroesophageal junction. Left-sided transvenous pacemaker leads overlying the right ventricle and coronary sinus.  Heart is mildly enlarged. There is minimal right lower lobe opacity, improved possibly related to positioning. Suspect right pleural scratched a suspect bilateral pleural effusions. There is pulmonary vascular congestion.  IMPRESSION: 1. Repositioned endotracheal tube. 2. Bilateral pleural effusions.   Electronically Signed   By: Nolon Nations M.D.   On: 05/15/2015 13:11   Dg Chest Port 1 View  05/15/2015   CLINICAL DATA:  Central line placement.  EXAM: PORTABLE CHEST - 1 VIEW  COMPARISON:  Same day.  FINDINGS: Stable cardiomediastinal silhouette. Stable bibasilar opacities are noted concerning for atelectasis or edema with mild associated pleural effusions. No definite pneumothorax is noted. Left-sided pacemaker is unchanged in position. Status post coronary artery bypass graft. Interval placement of right internal jugular catheter line with distal tip overlying expected position of the SVC. Endotracheal tube has advanced with distal tip less than 1 cm above the carina. Nasogastric tube tip is seen entering stomach.  IMPRESSION: Interval placement of right internal jugular catheter with distal tip overlying expected position of the SVC. No pneumothorax is noted. Stable bibasilar opacities are noted concerning for atelectasis or edema with associated mild pleural effusions.  Endotracheal tube is now seen with distal tip less than 1 cm above the  carina. Withdrawal by 2-3 cm is recommended. Critical Value/emergent results were called by telephone at the time of interpretation on 05/15/2015 at 9:15 am to Rosine Door, the patient's nurse, who verbally acknowledged these results.   Electronically Signed   By: Marijo Conception, M.D.   On: 05/15/2015 09:15   Dg Chest Port 1 View  05/15/2015   CLINICAL DATA:  Status post endotracheal tube and nasogastric catheter placement  EXAM: PORTABLE CHEST - 1 VIEW  COMPARISON:  05/15/2015  FINDINGS: Cardiomegaly is again noted. Postsurgical changes are again seen. A new endotracheal tube and nasogastric catheter are noted in satisfactory position. The endotracheal tube is approximately 6.1 cm above the carina. The degree of aeration has improved although suggestion of small effusions are again noted. The degree of vascular congestion has also improved.  IMPRESSION: Improved aeration with suggestion of small pleural effusions.  Endotracheal tube and nasogastric catheter in satisfactory position.   Electronically Signed   By: Inez Catalina M.D.   On: 05/15/2015 08:07   Dg Chest Port 1 View  05/15/2015   CLINICAL DATA:  Shortness of Breath  EXAM: PORTABLE CHEST - 1 VIEW  COMPARISON:  04/28/2015  FINDINGS: Cardiac shadow remains enlarged. Pacing device and postsurgical changes are again seen. The lungs are well aerated bilaterally. Mild increased vascular congestion is seen as well as small bilateral pleural effusions. No focal infiltrate is noted.  IMPRESSION: Mild vascular congestion and small pleural effusions.   Electronically Signed   By: Inez Catalina M.D.   On: 05/15/2015 07:45   US Abdomen Limited Ruq  05/16/2015   CLINICAL DATA:  Abnormal LFTs  EXAM: US ABDOMEN LIMITED - RIGHT UPPER QUADRANT  COMPARISON:  None.  FINDINGS: Gallbladder:  Layering gallbladder sludge possible tiny gallstones. Gallbladder is underdistended with secondary mild gallbladder wall thickening. Negative sonographic Murphy's sign.  Common  bile duct:  Diameter: 2 mm  Liver:  No focal lesion identified. Within normal limits in parenchymal echogenicity.  Additional comments:  Ascites.  Right pleural effusion.  IMPRESSION: Gallbladder sludge with possible tiny gallstones.  No sonographic findings to suggest acute cholecystitis.  Ascites.  Right pleural effusion.   Electronically Signed   By: Julian Hy M.D.   On: 05/16/2015 12:28    ASSESSMENT AND PLAN  1. Acute on chronic systolic failure/cardiogenic shock  - EF 40-45% in 09/2014  - Echo 5/18 showed LV EF of 20-25%, mild LVH, severe hypokinesis of the basal-mid inferior and inferoseptal myocardium, mild aortic reg, and mild mitral regur  - although has significant drop in EF, not currently a cath candidate given renal failure and pancytopneia  2. Permanent atrial fibrillation   - CHADSVASc score is at least 5. (age 31, CHF 1, HTN 1, CAD 1). On Coumadin at home, stopped with thrombocytopenia  3. Elevated troponin: trop 1.62 --> 2.31 -- >3.07.    - likely a combination of shock and underlying ischemia. No a good invasive workup candidate given failure renal function.   4. hx of CAD s/p CABG 1993  5. Severe pancytopenia due to high-dose imuran therapy.   6. WCT - suspect AFL. Broke with amio. Will have STJ interrogate device   Volume status improved on CVVHD but he remains very tenuous with co-ox in 50s on levophed. Would continue current support for now. Continue amio for WCT. Will interrogate device. Not AC candidate due to PLT < 5k. (heme following). Prognosis poor.   The patient is critically ill with multiple organ systems failure and requires high complexity decision making for assessment and support, frequent evaluation and titration of therapies, application of advanced monitoring technologies and extensive interpretation of multiple databases.   Critical Care Time devoted to patient care services described in this note is 35 Minutes.    Glori Bickers, MD    05/18/2015 8:04 AM

## 2015-05-19 DIAGNOSIS — R7989 Other specified abnormal findings of blood chemistry: Secondary | ICD-10-CM

## 2015-05-19 DIAGNOSIS — J9600 Acute respiratory failure, unspecified whether with hypoxia or hypercapnia: Secondary | ICD-10-CM

## 2015-05-19 DIAGNOSIS — R945 Abnormal results of liver function studies: Secondary | ICD-10-CM | POA: Insufficient documentation

## 2015-05-19 LAB — GLUCOSE, CAPILLARY
GLUCOSE-CAPILLARY: 142 mg/dL — AB (ref 65–99)
GLUCOSE-CAPILLARY: 86 mg/dL (ref 65–99)
Glucose-Capillary: 134 mg/dL — ABNORMAL HIGH (ref 65–99)
Glucose-Capillary: 102 mg/dL — ABNORMAL HIGH (ref 65–99)
Glucose-Capillary: 46 mg/dL — ABNORMAL LOW (ref 65–99)
Glucose-Capillary: 124 mg/dL — ABNORMAL HIGH (ref 65–99)
Glucose-Capillary: 82 mg/dL (ref 65–99)

## 2015-05-19 LAB — POCT I-STAT 3, ART BLOOD GAS (G3+)
ACID-BASE DEFICIT: 7 mmol/L — AB (ref 0.0–2.0)
Bicarbonate: 18.1 mEq/L — ABNORMAL LOW (ref 20.0–24.0)
O2 Saturation: 98 %
PO2 ART: 106 mmHg — AB (ref 80.0–100.0)
Patient temperature: 94
TCO2: 19 mmol/L (ref 0–100)
pCO2 arterial: 32.1 mmHg — ABNORMAL LOW (ref 35.0–45.0)
pH, Arterial: 7.346 — ABNORMAL LOW (ref 7.350–7.450)

## 2015-05-19 LAB — RETICULOCYTES
RBC.: 3.5 MIL/uL — AB (ref 4.22–5.81)
Retic Ct Pct: <0.4 % — ABNORMAL LOW (ref 0.4–3.1)

## 2015-05-19 LAB — COMPREHENSIVE METABOLIC PANEL
ALT: 341 U/L — ABNORMAL HIGH (ref 17–63)
AST: 179 U/L — AB (ref 15–41)
Albumin: 1.7 g/dL — ABNORMAL LOW (ref 3.5–5.0)
Alkaline Phosphatase: 31 U/L — ABNORMAL LOW (ref 38–126)
Anion gap: 14 (ref 5–15)
BILIRUBIN TOTAL: 10.3 mg/dL — AB (ref 0.3–1.2)
BUN: 18 mg/dL (ref 6–20)
CALCIUM: 7.6 mg/dL — AB (ref 8.9–10.3)
CHLORIDE: 101 mmol/L (ref 101–111)
CO2: 18 mmol/L — ABNORMAL LOW (ref 22–32)
Creatinine, Ser: 0.86 mg/dL (ref 0.61–1.24)
GFR calc Af Amer: >60 mL/min (ref >60)
GFR calc non Af Amer: >60 mL/min (ref >60)
Glucose, Bld: 139 mg/dL — ABNORMAL HIGH (ref 65–99)
Potassium: 4.8 mmol/L (ref 3.5–5.1)
Sodium: 133 mmol/L — ABNORMAL LOW (ref 135–145)
Total Protein: 4 g/dL — ABNORMAL LOW (ref 6.5–8.1)

## 2015-05-19 LAB — CARBOXYHEMOGLOBIN
Carboxyhemoglobin: 0.5 % (ref 0.5–1.5)
Methemoglobin: 0.6 % (ref 0.0–1.5)
O2 SAT: 63.4 %
TOTAL HEMOGLOBIN: 11.4 g/dL — AB (ref 13.5–18.0)

## 2015-05-19 LAB — RENAL FUNCTION PANEL
ANION GAP: 18 — AB (ref 5–15)
Albumin: 1.5 g/dL — ABNORMAL LOW (ref 3.5–5.0)
BUN: 13 mg/dL (ref 6–20)
CALCIUM: 7.3 mg/dL — AB (ref 8.9–10.3)
CO2: 13 mmol/L — ABNORMAL LOW (ref 22–32)
CREATININE: 0.82 mg/dL (ref 0.61–1.24)
Chloride: 104 mmol/L (ref 101–111)
GFR calc Af Amer: >60 mL/min (ref >60)
GFR calc non Af Amer: >60 mL/min (ref >60)
GLUCOSE: 56 mg/dL — AB (ref 65–99)
PHOSPHORUS: 4.8 mg/dL — AB (ref 2.5–4.6)
Potassium: 5 mmol/L (ref 3.5–5.1)
SODIUM: 135 mmol/L (ref 135–145)

## 2015-05-19 LAB — CBC
HEMATOCRIT: 30.7 % — AB (ref 39.0–52.0)
Hemoglobin: 10.7 g/dL — ABNORMAL LOW (ref 13.0–17.0)
MCH: 30.8 pg (ref 26.0–34.0)
MCHC: 34.9 g/dL (ref 30.0–36.0)
MCV: 88.5 fL (ref 78.0–100.0)
Platelets: 8 10*3/uL — CL (ref 150–400)
RBC: 3.47 MIL/uL — AB (ref 4.22–5.81)
RDW: 15.1 % (ref 11.5–15.5)
WBC: 0.2 10*3/uL — CL (ref 4.0–10.5)

## 2015-05-19 LAB — PHOSPHORUS: PHOSPHORUS: 3.9 mg/dL (ref 2.5–4.6)

## 2015-05-19 LAB — APTT: APTT: 42 s — AB (ref 24–37)

## 2015-05-19 LAB — MAGNESIUM: Magnesium: 2.5 mg/dL — ABNORMAL HIGH (ref 1.7–2.4)

## 2015-05-19 MED ORDER — VANCOMYCIN HCL IN DEXTROSE 750-5 MG/150ML-% IV SOLN
750.0000 mg | INTRAVENOUS | Status: DC
  Filled 2015-05-19: qty 150

## 2015-05-19 MED ORDER — DEXTROSE 50 % IV SOLN
INTRAVENOUS | Status: AC
  Administered 2015-05-19: 16:00:00
  Filled 2015-05-19: qty 50

## 2015-05-19 MED ORDER — SODIUM CHLORIDE 0.9 % IV SOLN
500.0000 mg | Freq: Three times a day (TID) | INTRAVENOUS | Status: DC
  Administered 2015-05-19 – 2015-05-20 (×4): 500 mg via INTRAVENOUS
  Filled 2015-05-19 (×7): qty 500

## 2015-05-19 MED ORDER — PANTOPRAZOLE SODIUM 40 MG IV SOLR
40.0000 mg | INTRAVENOUS | Status: DC
  Administered 2015-05-19: 40 mg via INTRAVENOUS
  Filled 2015-05-19 (×2): qty 40

## 2015-05-19 MED ORDER — DEXTROSE 50 % IV SOLN
50.0000 mL | Freq: Once | INTRAVENOUS | Status: DC
  Filled 2015-05-19: qty 50

## 2015-05-19 MED ORDER — SODIUM CHLORIDE 0.9 % IV BOLUS (SEPSIS)
1000.0000 mL | Freq: Once | INTRAVENOUS | Status: AC
  Administered 2015-05-19: 1000 mL via INTRAVENOUS

## 2015-05-19 MED ORDER — DEXTROSE 50 % IV SOLN
1.0000 | Freq: Once | INTRAVENOUS | Status: AC
  Administered 2015-05-19: 50 mL via INTRAVENOUS
  Filled 2015-05-19: qty 50

## 2015-05-19 MED ORDER — SODIUM CHLORIDE 0.9 % IV SOLN
100.0000 mg | Freq: Every day | INTRAVENOUS | Status: DC
  Administered 2015-05-19: 100 mg via INTRAVENOUS
  Filled 2015-05-19 (×2): qty 100

## 2015-05-19 MED ORDER — VASOPRESSIN 20 UNIT/ML IV SOLN
0.0300 [IU]/min | INTRAVENOUS | Status: DC
  Administered 2015-05-19: 0.03 [IU]/min via INTRAVENOUS
  Filled 2015-05-19: qty 2

## 2015-05-19 MED ORDER — SODIUM CHLORIDE 0.9 % IV SOLN: Freq: Once | INTRAVENOUS | Status: DC

## 2015-05-19 MED ORDER — VANCOMYCIN HCL 10 G IV SOLR
1250.0000 mg | Freq: Once | INTRAVENOUS | Status: AC
  Administered 2015-05-19: 1250 mg via INTRAVENOUS
  Filled 2015-05-19: qty 1250

## 2015-05-19 NOTE — Progress Notes (Signed)
Pharmacist Heart Failure Core Measure Documentation  Assessment: Nicholas [name] has an EF documented as 20-25% on 05/14/15 by ECHO.  Rationale: Heart failure patients with left ventricular systolic dysfunction (LVSD) and an EF < 40% should be prescribed an angiotensin converting enzyme inhibitor (ACEI) or angiotensin receptor blocker (ARB) at discharge unless a contraindication is documented in the medical record.  This patient is not currently on an ACEI or ARB for HF.  This note is being placed in the record in order to provide documentation that a contraindication to the use of these agents is present for this encounter.  ACE Inhibitor or Angiotensin Receptor Blocker is contraindicated (specify all that apply)  []   ACEI allergy AND ARB allergy []   Angioedema []   Moderate or severe aortic stenosis []   Hyperkalemia []   Hypotension []   Renal artery stenosis [x]   Worsening renal function, preexisting renal disease or dysfunction   Brain Hilts 05/19/2015 8:08 AM

## 2015-05-19 NOTE — Progress Notes (Signed)
PULMONARY / CRITICAL CARE MEDICINE   Name: VENCENT HAUSCHILD MRN: [id number] DOB: 06-09-1934    ADMISSION DATE:  05/16/2015 CONSULTATION DATE:  05/15/2015  REFERRING MD :  Tana Coast  CHIEF COMPLAINT:  Acute confusion  INITIAL PRESENTATION: 81 y/o male admitted on 5/18 to [facility] with pancytopenia and dyspnea.  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  STUDIES:  5/19 CT head >> NAICP 5/18 ECHO >> LVEF 20-25%, inferior WMA's, mild AI, mild MR, LAE, RAE  SIGNIFICANT EVENTS: 5/19  Early AM became unresponsive, required intubation 5/20  Worsening shock, lactic acidosis, started on BICAB GTT, neurology, cardiology and nephrology consulted 5/21  Remains on CVVHD, goal net neg 50. Received 1 unit PRBC's 5/23 maxed pressors  SUBJECTIVE:  Max levophed  VITAL SIGNS: Temp:  [94.1 F (34.5 C)-95.1 F (35.1 C)] 94.8 F (34.9 C) (05/23 0600) Pulse Rate:  [59-80] 70 (05/23 0700) Resp:  [13-27] 13 (05/23 0700) BP: (84-257)/(23-188) 159/115 mmHg (05/23 0500) SpO2:  [99 %-100 %] 100 % (05/23 0700) Arterial Line BP: (57-125)/(37-64) 76/44 mmHg (05/23 0700) FiO2 (%):  [30 %] 30 % (05/23 0753) Weight:  [71.7 kg (158 lb 1.1 oz)] 71.7 kg (158 lb 1.1 oz) (05/23 0400)   HEMODYNAMICS: CVP:  [3 mmHg-6 mmHg] 4 mmHg   VENTILATOR SETTINGS: Vent Mode:  [-] PRVC FiO2 (%):  [30 %] 30 % Set Rate:  [14 bmp] 14 bmp Vt Set:  [580 mL] 580 mL PEEP:  [5 cmH20] 5 cmH20 Plateau Pressure:  [15 cmH20-18 cmH20] 16 cmH20   INTAKE / OUTPUT:  Intake/Output Summary (Last 24 hours) at 05/19/15 0809 Last data filed at 05/19/15 0700  Gross per 24 hour  Intake   1646 ml  Output   1666 ml  Net    -20 ml    PHYSICAL EXAMINATION: General:  Frail, chronically ill male in NAD on vent  Neuro: rass 0 , follows commands HEENT:  Line clean Cardiovascular:  s1s2 rrr, no m/r/g, no jvd Lungs: coarse bilateral GI: BS+, soft, non tender, no r/g Musculoskeletal:  Decreased bulk, wasting  Skin:  No rash or skin  breakdown  LABS:  CBC  Recent Labs Lab 05/17/15 0359 05/18/15 0515 05/19/15 0450  WBC 0.2* 0.3* 0.2*  HGB 9.3* 10.2* 10.7*  HCT 26.5* 28.6* 30.7*  PLT 9* <5* 8*   Coag's  Recent Labs Lab 05/17/2015 2348 05/15/15 1215 05/17/15 0359 05/18/15 0515 05/19/15 0450  APTT  --   --  46* 41* 42*  INR 1.64* 2.27*  --   --   --    BMET  Recent Labs Lab 05/18/15 0515 05/18/15 1614 05/19/15 0450  NA 135 135 133*  K 4.2 4.2 4.8  CL 101 105 101  CO2 24 23 18*  BUN 24* 20 18  CREATININE 1.21 0.91 0.86  GLUCOSE 136* 163* 139*   Electrolytes  Recent Labs Lab 05/17/15 0359  05/18/15 0515 05/18/15 1614 05/19/15 0450  CALCIUM 7.5*  < > 7.7* 7.4* 7.6*  MG 2.0  --  2.4  --  2.5*  PHOS 2.4*  < > 2.7 4.4 3.9  < > = values in this interval not displayed.   Sepsis Markers  Recent Labs Lab 05/15/15 1745 05/16/15 0025 05/16/15 0430  LATICACIDVEN 7.8* 7.6* 6.6*   ABG  Recent Labs Lab 05/15/15 1050  PHART 7.305*  PCO2ART 30.0*  PO2ART 444*   Liver Enzymes  Recent Labs Lab 05/15/15 1215  05/17/15 0359  05/18/15 0515 05/18/15 1614 05/19/15  0450  AST 155*  --  482*  --   --   --  179*  ALT 81*  --  332*  --   --   --  341*  ALKPHOS 30*  --  24*  --   --   --  31*  BILITOT 5.6*  --  8.1*  --   --   --  10.3*  ALBUMIN 2.3*  < > 1.7*  < > 1.7* 1.6* 1.7*  < > = values in this interval not displayed.   Cardiac Enzymes  Recent Labs Lab 05/16/15 1600 05/17/15 0105 05/17/15 0815  TROPONINI 1.65* 0.93* 0.71*   Glucose  Recent Labs Lab 05/18/15 0804 05/18/15 1308 05/18/15 1600 05/18/15 1938 05/18/15 2353 05/19/15 0351  GLUCAP 130* 146* 152* 123* 142* 134*    Imaging  5/22 CXR >> improved L effusion/atx, small R effusion  None new  ASSESSMENT / PLAN:  NEUROLOGIC A:   Acute encephalopathy - improved, CT head negative  Myasthenia - does not appear to be flaring right now P:   RASS goal: 0 to -1 Neurology rounding Fentanyl gtt, with  WUA Continue mestinon  PULMONARY OETT 5/19 >> A: Acute respiratory failure with hypoxemia due to CHF exacerbation, DDX includes HCAP, was febrile 5/18 Pleural Effusions  Basilar ATX P:   High dose pressors, no weaning today ABG now , ensure adequate MV pcxr noted improved effusions, repeat in am   CARDIOVASCULAR CVL 5/19 >> A:  Acute decompensated CHF - LVEF 20-25% which is new with new WMA's, ddx includes ischemia but EKG non-specific on admission Demand ischemia Cardiogenic shock - complicated by possible sepsis and anemia, currently afebrile  Afib  P:  Not currently a cath candidate (drop in EF) Titrate levophed for MAP >60-55 with EF 20% (with good mentation) Continue amiodarone  LCB - no CPR, no defib, no code medications.  [place] for pressors.   dobutamine off with recent arrytmia  RENAL A:   Acute on chronic renal insufficiency - oliguric 5/20, CVVHD started P:   CVVHD per Nephrology  Goal even balance, cvp low , accuracy>?, pcxr has improved  GASTROINTESTINAL A:   Abnormal LFTs with elevated direct bili > no clear source of hepatic congestion, may be related to heart failure P:   OG tube Pepcid for SUP - dc with low plat, start ppi Continue tube feedings, restart RUQ ultrasound >>Gallbladder sludge with possible tiny gallstones.No sonographic findings to suggest acute cholecystitis  HEMATOLOGIC A:   Acute pancytopenia - on admission, most likely due to Imuran in setting of renal failure.  S/P 1 unit PRBC 5/21 (shock) Neutropenia P:  No further imuran Monitor for bleeding Heme to order plat Assess retic count scd Daily CBC Neupogen given, consider to continue  INFECTIOUS A:   Neutropenic fever with infiltrates, could be HCAP but favor pulm edema as cause of CXR abnormalities P:   BCx2 5/19 >> Sputum 5/19 >> klebsiella >> R- ampicillin, otherwise sens  Cefepime 5/19 >>5/23 Imipenem 5/23>>> mycofungin 5/23>>> vanc 5/23>>  Monitor culture closely,  hold vanc due to myasthenia-however, he is failing, and unable to use linezolid with plat count, would favor use vanc as benefit would outwt risks I am concerned that the klieb is not the only pathogen Pressors remain high, not progressing, change cefepime to imipenem Add empiric mycofungin Repeat BC   ENDOCRINE A:   Hypoglycemia  Adrenal insuff P:   Monitor glucose closely, wnl Steroids stress  FAMILY  - Updates:  Wife & daughter updated at bedside.  They indicate he has been making statements that he "is tired and doesn't want to keep going to the doctors".  He also had refused home health in the recent past.  He has had a prolonged course of illness and unfortunately is not making progress.  They do not want prolonged suffering for him but want to give him a reasonable chance of recovery.  In the event of cardiac arrest, they do not want CPR or defib.  At present, they want to continue current medical care but he does not make progress or declines (in the next 24-48 hours), they would be open to further discussion for comfort care.    Ccm time 30 min   Lavon Paganini. Titus Mould, MD, [facility] Pgr: [facility]

## 2015-05-19 NOTE — Progress Notes (Signed)
[facility] ROUNDING NOTE   Subjective:   Interval History: hypotensive today.   Objective:  Vital signs in last 24 hours:  Temp:  [93.5 F (34.2 C)-95.1 F (35.1 C)] 93.5 F (34.2 C) (05/23 0810) Pulse Rate:  [59-80] 70 (05/23 0700) Resp:  [13-27] 13 (05/23 0700) BP: (84-257)/(23-188) 159/115 mmHg (05/23 0500) SpO2:  [99 %-100 %] 100 % (05/23 0700) Arterial Line BP: (57-125)/(37-64) 76/44 mmHg (05/23 0700) FiO2 (%):  [30 %] 30 % (05/23 0753) Weight:  [71.7 kg (158 lb 1.1 oz)] 71.7 kg (158 lb 1.1 oz) (05/23 0400)  Weight change: 0.4 kg (14.1 oz) Filed Weights   05/17/15 0419 05/18/15 0415 05/19/15 0400  Weight: 72.5 kg (159 lb 13.3 oz) 71.3 kg (157 lb 3 oz) 71.7 kg (158 lb 1.1 oz)    Intake/Output: I/O last 3 completed shifts: In: 2581.8 [I.V.:1993.8; NG/GT:130; IV Piggyback:458] Out: 2871 [Other:2796; Stool:75]   Intake/Output this shift:     CVS- hypotension RS- intubated ventilator ABD- hypoactive bowel sounds EXT- no edema   Basic Metabolic Panel:  Recent Labs Lab 05/15/15 1215  05/15/15 1950  05/17/15 0359 05/17/15 0815 05/17/15 1605 05/18/15 0515 05/18/15 1614 05/19/15 0450  NA  --   < >  --   < > 137  --  136 135 135 133*  K  --   < >  --   < > 3.8  --  4.1 4.2 4.2 4.8  CL  --   < >  --   < > 103  --  102 101 105 101  CO2  --   < >  --   < > 25  --  21* 24 23 18*  GLUCOSE  --   < >  --   < > 125*  --  143* 136* 163* 139*  BUN  --   < >  --   < > 37*  --  28* 24* 20 18  CREATININE  --   < >  --   < > 2.03*  --  1.50* 1.21 0.91 0.86  CALCIUM  --   < >  --   < > 7.5*  --  7.5* 7.7* 7.4* 7.6*  MG 1.9  --  1.7  --  2.0  --   --  2.4  --  2.5*  PHOS 6.9*  --  4.6  < > 2.4* 2.4* 3.1 2.7 4.4 3.9  < > = values in this interval not displayed.  Liver Function Tests:  Recent Labs Lab 05/15/15 1215  05/17/15 0359 05/17/15 1605 05/18/15 0515 05/18/15 1614 05/19/15 0450  AST 155*  --  482*  --   --   --  179*  ALT 81*  --  332*  --    --   --  341*  ALKPHOS 30*  --  24*  --   --   --  31*  BILITOT 5.6*  --  8.1*  --   --   --  10.3*  PROT 5.2*  --  4.4*  --   --   --  4.0*  ALBUMIN 2.3*  < > 1.7* 1.8* 1.7* 1.6* 1.7*  < > = values in this interval not displayed. No results for input(s): LIPASE, AMYLASE in the last 168 hours. No results for input(s): AMMONIA in the last 168 hours.  CBC:  Recent Labs Lab 05/05/2015 2348  05/16/15 0431 05/16/15 1600 05/17/15 0359 05/18/15 0515 05/19/15 0450  WBC 0.1*  < >  0.1* 0.2* 0.2* 0.3* 0.2*  NEUTROABS 0.0*  --  0.0*  --  0.0*  --   --   HGB 6.7*  < > 8.7* 9.0* 9.3* 10.2* 10.7*  HCT 20.8*  < > 25.6* 25.9* 26.5* 28.6* 30.7*  MCV 102.0*  < > 94.1 89.9 90.1 88.3 88.5  PLT 8*  < > <5* 9* 9* <5* 8*  < > = values in this interval not displayed.  Cardiac Enzymes:  Recent Labs Lab 05/16/15 0215 05/16/15 1213 05/16/15 1600 05/17/15 0105 05/17/15 0815  TROPONINI 3.07* 2.49* 1.65* 0.93* 0.71*    BNP: Invalid input(s): POCBNP  CBG:  Recent Labs Lab 05/18/15 1600 05/18/15 1938 05/18/15 2353 05/19/15 0351 05/19/15 0809  GLUCAP 152* 123* 142* 134* 102*    Microbiology: Results for orders placed or performed during the hospital encounter of 05/25/2015  MRSA PCR Screening     Status: None   Collection Time: 05/14/15  6:48 AM  Result Value Ref Range Status   MRSA by PCR NEGATIVE NEGATIVE Final    Comment:        The GeneXpert MRSA Assay (FDA approved for NASAL specimens only), is one component of a comprehensive MRSA colonization surveillance program. It is not intended to diagnose MRSA infection nor to guide or monitor treatment for MRSA infections.   Culture, respiratory (NON-Expectorated)     Status: None   Collection Time: 05/15/15 10:34 AM  Result Value Ref Range Status   Specimen Description TRACHEAL ASPIRATE  Final   Special Requests Normal  Final   Gram Stain   Final    RARE WBC PRESENT, PREDOMINANTLY PMN RARE SQUAMOUS EPITHELIAL CELLS PRESENT FEW  GRAM POSITIVE COCCI IN PAIRS IN CHAINS RARE GRAM POSITIVE RODS Performed at [facility]    Culture   Final    FEW KLEBSIELLA PNEUMONIAE Performed at [facility]    Report Status 05/18/2015 FINAL  Final   Organism ID, Bacteria KLEBSIELLA PNEUMONIAE  Final      Susceptibility   Klebsiella pneumoniae - MIC*    AMPICILLIN >=32 RESISTANT Resistant     AMPICILLIN/SULBACTAM 4 SENSITIVE Sensitive     CEFAZOLIN <=4 SENSITIVE Sensitive     CEFEPIME <=1 SENSITIVE Sensitive     CEFTAZIDIME <=1 SENSITIVE Sensitive     CEFTRIAXONE <=1 SENSITIVE Sensitive     CIPROFLOXACIN <=0.25 SENSITIVE Sensitive     GENTAMICIN <=1 SENSITIVE Sensitive     IMIPENEM <=0.25 SENSITIVE Sensitive     PIP/TAZO <=4 SENSITIVE Sensitive     TOBRAMYCIN <=1 SENSITIVE Sensitive     TRIMETH/SULFA <=20 SENSITIVE Sensitive     * FEW KLEBSIELLA PNEUMONIAE  Culture, blood (routine x 2)     Status: None (Preliminary result)   Collection Time: 05/15/15  4:15 PM  Result Value Ref Range Status   Specimen Description BLOOD LEFT HAND  Final   Special Requests BOTTLES DRAWN AEROBIC ONLY 3CC  Final   Culture   Final           BLOOD CULTURE RECEIVED NO GROWTH TO DATE CULTURE WILL BE HELD FOR 5 DAYS BEFORE ISSUING A FINAL NEGATIVE REPORT Performed at [facility]    Report Status PENDING  Incomplete  Culture, blood (routine x 2)     Status: None (Preliminary result)   Collection Time: 05/15/15  4:30 PM  Result Value Ref Range Status   Specimen Description BLOOD RIGHT HAND  Final   Special Requests BOTTLES DRAWN AEROBIC ONLY 3CC  Final  Culture   Final           BLOOD CULTURE RECEIVED NO GROWTH TO DATE CULTURE WILL BE HELD FOR 5 DAYS BEFORE ISSUING A FINAL NEGATIVE REPORT Performed at [facility]    Report Status PENDING  Incomplete    Coagulation Studies: No results for input(s): LABPROT, INR in the last 72 hours.  Urinalysis: No results for input(s): COLORURINE, LABSPEC, PHURINE,  GLUCOSEU, HGBUR, BILIRUBINUR, KETONESUR, PROTEINUR, UROBILINOGEN, NITRITE, LEUKOCYTESUR in the last 72 hours.  Invalid input(s): APPERANCEUR    Imaging: Dg Chest Port 1 View  05/18/2015   CLINICAL DATA:  Acute respiratory failure.  EXAM: PORTABLE CHEST - 1 VIEW  COMPARISON:  05/17/2015.  FINDINGS: Endotracheal tube in satisfactory position. Stable enlarged cardiac silhouette and post CABG changes. Stable left subclavian pacer leads. Nasogastric tube tip in the proximal stomach. Right jugular catheter tip in the superior vena cava.  Clear left lung with resolved ill-defined opacity at the left lung base. No significant change in ill-defined opacity in the right lung, remaining most pronounced at the lung base.  IMPRESSION: 1. Resolved left pleural fluid and atelectasis. 2. Stable right atelectasis and probable pleural fluid.   Electronically Signed   By: Claudie Revering M.D.   On: 05/18/2015 09:01     Medications:   . amiodarone 30 mg/hr (05/19/15 0700)  . feeding supplement (VITAL AF 1.2 CAL) Stopped (05/16/15 0800)  . fentaNYL infusion INTRAVENOUS 50 mcg/hr (05/19/15 0700)  . norepinephrine (LEVOPHED) Adult infusion 40 mcg/min (05/19/15 0700)  . dialysis replacement fluid (prismasate) 800 mL/hr at 05/19/15 0617  . dialysis replacement fluid (prismasate) 700 mL/hr at 05/19/15 0811  . dialysate (PRISMASATE) 1,500 mL/hr at 05/19/15 0715   . sodium chloride   Intravenous Once  . antiseptic oral rinse  7 mL Mouth Rinse QID  . chlorhexidine  15 mL Mouth Rinse BID  . fentaNYL (SUBLIMAZE) injection  50 mcg Intravenous Once  . ferrous sulfate  220 mg Per Tube BID WC  . hydrocortisone sodium succinate  50 mg Intravenous Q6H  . imipenem-cilastatin  500 mg Intravenous 3 times per day  . micafungin [facility]) IV  100 mg Intravenous Daily  . pantoprazole (PROTONIX) IV  40 mg Intravenous Q24H  . pyridostigmine  30 mg Per Tube 3 times per day  . sodium chloride  3 mL Intravenous Q12H  . Tbo-Filgrastim   480 mcg Subcutaneous Daily  . vancomycin  1,250 mg Intravenous Once   Followed by  . [START ON 2015-06-14] vancomycin  750 mg Intravenous Q24H   sodium chloride, acetaminophen, albuterol, fentaNYL, fentaNYL (SUBLIMAZE) injection, heparin, sodium chloride, sodium chloride  Assessment/ Plan:  1 CKD/AKI CRRT at this point unable to tolerate volume removal  2 Anemia  Hemoglobin stable 3 Pancytopenia Marrow aplasia and treated with growth factor 4 VDRF  Trach and ventilated 5 Sepsis imipenem and vanco and mycofungin  Growth positive for klebsiella sputum  6 Myathenia Gravis  Disposition. Appears to have a poor prognosis    LOS: 5 [place] W @TODAY @9 :38 AM

## 2015-05-19 NOTE — Progress Notes (Signed)
[facility] Progress Note Patient Name: Nicholas [name] DOB: 1934-07-23 MRN: [id number]   Date of Service  05/19/2015  HPI/Events of Note  BP cuff pressure reading higher than A line pressure.  A line with poor wave form.   eICU Interventions  Advised to monitor cuff BP readings.     Intervention Category Major Interventions: Other:  [name] 05/19/2015, 7:40 PM

## 2015-05-19 NOTE — Progress Notes (Signed)
Patient Name: Nicholas [name] Date of Encounter: 05/19/2015  Primary Cardiologist: Dr. Mare Ferrari    SUBJECTIVE  Intubated. On pressors.  CURRENT MEDS . sodium chloride   Intravenous Once  . antiseptic oral rinse  7 mL Mouth Rinse QID  . chlorhexidine  15 mL Mouth Rinse BID  . fentaNYL (SUBLIMAZE) injection  50 mcg Intravenous Once  . ferrous sulfate  220 mg Per Tube BID WC  . hydrocortisone sodium succinate  50 mg Intravenous Q6H  . imipenem-cilastatin  500 mg Intravenous 3 times per day  . micafungin [facility]) IV  100 mg Intravenous Daily  . pantoprazole (PROTONIX) IV  40 mg Intravenous Q24H  . pyridostigmine  30 mg Per Tube 3 times per day  . sodium chloride  3 mL Intravenous Q12H  . Tbo-Filgrastim  480 mcg Subcutaneous Daily  . vancomycin  1,250 mg Intravenous Once   Followed by  . [START ON May 27, 2015] vancomycin  750 mg Intravenous Q24H    OBJECTIVE  Filed Vitals:   05/19/15 0630 05/19/15 0645 05/19/15 0700 05/19/15 0810  BP:      Pulse: 70 71 70   Temp:    93.5 F (34.2 C)  TempSrc:    Axillary  Resp: 21 14 13    Height:      Weight:      SpO2: 100% 100% 100%     Intake/Output Summary (Last 24 hours) at 05/19/15 0937 Last data filed at 05/19/15 0700  Gross per 24 hour  Intake 1508.2 ml  Output   1567 ml  Net  -58.8 ml   Filed Weights   05/17/15 0419 05/18/15 0415 05/19/15 0400  Weight: 159 lb 13.3 oz (72.5 kg) 157 lb 3 oz (71.3 kg) 158 lb 1.1 oz (71.7 kg)    PHYSICAL EXAM  General: Intubated, awake HEENT:  Normal  Lungs: on vent, anterior exam CTA. Heart: regularly irregular. no s3, s4, or murmurs. Abdomen: Soft, non-distended Extremities: no edema  Accessory Clinical Findings  CBC  Recent Labs  05/17/15 0359 05/18/15 0515 05/19/15 0450  WBC 0.2* 0.3* 0.2*  NEUTROABS 0.0*  --   --   HGB 9.3* 10.2* 10.7*  HCT 26.5* 28.6* 30.7*  MCV 90.1 88.3 88.5  PLT 9* <5* 8*   Basic Metabolic Panel  Recent Labs  05/18/15 0515 05/18/15 1614  05/19/15 0450  NA 135 135 133*  K 4.2 4.2 4.8  CL 101 105 101  CO2 24 23 18*  GLUCOSE 136* 163* 139*  BUN 24* 20 18  CREATININE 1.21 0.91 0.86  CALCIUM 7.7* 7.4* 7.6*  MG 2.4  --  2.5*  PHOS 2.7 4.4 3.9   Liver Function Tests  Recent Labs  05/17/15 0359  05/18/15 1614 05/19/15 0450  AST 482*  --   --  179*  ALT 332*  --   --  341*  ALKPHOS 24*  --   --  31*  BILITOT 8.1*  --   --  10.3*  PROT 4.4*  --   --  4.0*  ALBUMIN 1.7*  < > 1.6* 1.7*  < > = values in this interval not displayed. Cardiac Enzymes  Recent Labs  05/16/15 1600 05/17/15 0105 05/17/15 0815  TROPONINI 1.65* 0.93* 0.71*    TELE A-fib with ventricular pacing, HR 60s    Echocardiogram 05/14/2015  LV EF: 20% -  25%  ------------------------------------------------------------------- Indications:   CHF - 428.0.  ------------------------------------------------------------------- History:  PMH:  Syncope and dyspnea. Risk factors: Hypertension.  -------------------------------------------------------------------  Study Conclusions  - Left ventricle: The cavity size was normal. Wall thickness was increased in a pattern of mild LVH. Systolic function was severely reduced. The estimated ejection fraction was in the range of 20% to 25%. There is severe hypokinesis of the basal-midinferior and inferoseptal myocardium. - Aortic valve: There was mild regurgitation. - Mitral valve: Calcified annulus. Mildly thickened leaflets . There was mild regurgitation. - Left atrium: The atrium was mildly dilated. - Right atrium: The atrium was mildly dilated.     ASSESSMENT AND PLAN  1. Acute on chronic systolic failure  - EF 17-00% in 09/2014  - Echo 5/18 showed LV EF of 20-25%, mild LVH, severe hypokinesis of the basal-mid inferior and inferoseptal myocardium, mild aortic reg, and mild mitral regur  - although has significant drop in EF, not currently a cath candidate given renal failure  and thrombocytopenia  - Now on cvvhd and pressors  2. Permanent atrial fibrillation   - CHADSVASc score is at least 5. (age 23, CHF 1, HTN 1, CAD 1). On Coumadin at home, stopped with thrombocytopenia; continue amiodarone.  3. Elevated troponin: trop 1.62 --> 2.31 -- >3.07.    - likely a combination of shock and underlying ischemia. No a good invasive workup candidate given failure renal function and thrombocytopenia.  4. hx of CAD s/p CABG 1993  5. Pancytopenia with severe thrombocytopenia -Per primary  6. CKD stage IV - now on cvvhd  7. Acute encephalopathy - Head CT showed no acute intracranial abnormality. Stable atrophy and chronic white matter disease. Cannot have MRI due to pacemaker. Further management per primary.  8. VDRF  - per CCM  Prognosis extremely poor; pt now NCB. Signed, Kirk Ruths MD

## 2015-05-19 NOTE — Care Management Note (Signed)
Case Management Note  Patient Details  Name: KEATS KINGRY MRN: [id number] Date of Birth: 01-28-1934  Subjective/Objective:                  Patient lives at home with wife.  Independent - states just weak.  Has RW, cane and shower chair at home that he does not use at this time.  Have had HH at home prior.   05-19-15 became unresponsive on 5-18 and was intubated - remains on vent, pressors and CRRT.  Action/Plan: CM will continue to follow for discharge planning  Expected Discharge Date:                  Expected Discharge Plan:  [place]  In-House Referral:     Discharge planning Services     Post Acute Care Choice:    Choice offered to:     DME Arranged:    DME Agency:     HH Arranged:    HH Agency:     Status of Service:  In process, will continue to follow  Medicare Important Message Given:    Date Medicare IM Given:    Medicare IM give by:    Date Additional Medicare IM Given:    Additional Medicare Important Message give by:     If discussed at [place] of Stay Meetings, dates discussed:    Additional Comments:  Vergie Living, RN 05/19/2015, 9:39 AM

## 2015-05-19 NOTE — Progress Notes (Signed)
[facility] Progress Note Patient Name: Nicholas [name] DOB: Mar 21, 1934 MRN: [id number]   Date of Service  05/19/2015  HPI/Events of Note  Hypotensive.  eICU Interventions  Will give fluid bolus 1 liter NS.     Intervention Category Major Interventions: Other:  Stephan Draughn 05/19/2015, 6:45 PM

## 2015-05-19 NOTE — Progress Notes (Signed)
IP PROGRESS NOTE  Subjective:   He remains intubated on the ventilator.  Objective: Vital signs in last 24 hours: Blood pressure 159/115, pulse 70, temperature 93.5 F (34.2 C), temperature source Axillary, resp. rate 13, height 5\' 10"  (1.778 m), weight 158 lb 1.1 oz (71.7 kg), SpO2 100 %.  Intake/Output from previous day: 05/22 0701 - 05/23 0700 In: 1833.8 [I.V.:1335.8; NG/GT:90; IV Piggyback:408] Out: 38 [Stool:75]  Physical Exam:  HEENT: Drive secretions at the palate, dried/fresh blood at the left pharynx and posterior oral cavity Abdomen: Soft, no splenomegaly, left lower quadrant colostomy Extremities: No leg edema Skin: Scattered ecchymoses, bleeding at skin tear sites over the left arm Neurologic: Alert, follows commands, moves extremities    Lab Results:  Recent Labs  05/18/15 0515 05/19/15 0450  WBC 0.3* 0.2*  HGB 10.2* 10.7*  HCT 28.6* 30.7*  PLT <5* 8*    BMET  Recent Labs  05/18/15 1614 05/19/15 0450  NA 135 133*  K 4.2 4.8  CL 105 101  CO2 23 18*  GLUCOSE 163* 139*  BUN 20 18  CREATININE 0.91 0.86  CALCIUM 7.4* 7.6*   Peripheral blood smear 05/14/2015: Rare lymphocyte, the platelets are markedly decreased in number. Ovalocytes, acanthocytes, and burr cells. The polychromasia is not increased.   Studies/Results: Dg Chest Port 1 View  05/18/2015   CLINICAL DATA:  Acute respiratory failure.  EXAM: PORTABLE CHEST - 1 VIEW  COMPARISON:  05/17/2015.  FINDINGS: Endotracheal tube in satisfactory position. Stable enlarged cardiac silhouette and post CABG changes. Stable left subclavian pacer leads. Nasogastric tube tip in the proximal stomach. Right jugular catheter tip in the superior vena cava.  Clear left lung with resolved ill-defined opacity at the left lung base. No significant change in ill-defined opacity in the right lung, remaining most pronounced at the lung base.  IMPRESSION: 1. Resolved left pleural fluid and atelectasis. 2. Stable right  atelectasis and probable pleural fluid.   Electronically Signed   By: Claudie Revering M.D.   On: 05/18/2015 09:01    Medications: I have reviewed the patient's current medications.  Assessment/Plan:  1. Severe pancytopenia-most likely related to Imuran therapy, status post red cell and platelet transfusions during this hospital admission  G-CSF started 05/15/2015  2. Chronic anemia secondary to renal insufficiency versus myelodysplasia  3. Chronic/acute renal failure  4. Myasthenia gravis-maintained on Imuran prior to hospital admission  5. Atrial fibrillation  6. Acute altered mental status 05/15/2015 secondary to sepsis syndrome, improved  7. Congestive heart failure  8. Fever/hypotension-systemic infection versus cardiogenic shock  He remains critically ill with multiorgan failure. There is persistent severe pancytopenia. I recommend continuing G-CSF and platelet transfusions as long as the plan is to continue full support.   Recommendations: 1. Hold Imuran 2. Transfuse platelets for a count of less than 20,000 or bleeding 3. Continue G-CSF and IV antibiotics    LOS: 5 days   [place]  05/19/2015, 8:16 AM

## 2015-05-19 NOTE — Progress Notes (Signed)
ANTIBIOTIC CONSULT NOTE - FOLLOW UP  Pharmacy Consult for Vancomycin and imipenem Indication: Sepsis  Allergies  Allergen Reactions  . Cephalexin Other (See Comments)    "too strong for my system" - per office note 05/05/2012  . Ciprofloxacin Other (See Comments)    Reaction unknown  . Hctz [Hydrochlorothiazide]     DIZZINESS   . Levofloxacin Other (See Comments)    "too strong for my system" - per office note 05/05/2012  . Lipitor [Atorvastatin Calcium]     MUSCLE PAIN  . Niaspan [Niacin Er]     FLUSHING   . Oxycodone-Acetaminophen Other (See Comments)    Reaction unknown  . Sulfonamide Derivatives Other (See Comments)    Reaction unkonwn    Patient Measurements: Height: 5\' 10"  (177.8 cm) Weight: 158 lb 1.1 oz (71.7 kg) IBW/kg (Calculated) : 73  Vital Signs: Temp: 93.5 F (34.2 C) (05/23 0810) Temp Source: Axillary (05/23 0810) BP: 159/115 mmHg (05/23 0500) Pulse Rate: 70 (05/23 0700) Intake/Output from previous day: 05/22 0701 - 05/23 0700 In: 1833.8 [I.V.:1335.8; NG/GT:90; IV Piggyback:408] Out: 1756 [Stool:75] Intake/Output from this shift: Total I/O In: -  Out: 2 [Other:2]  Labs:  Recent Labs  05/17/15 0359  05/18/15 0515 05/18/15 1614 05/19/15 0450  WBC 0.2*  --  0.3*  --  0.2*  HGB 9.3*  --  10.2*  --  10.7*  PLT 9*  --  <5*  --  8*  CREATININE 2.03*  < > 1.21 0.91 0.86  < > = values in this interval not displayed. Estimated Creatinine Clearance: 71.8 mL/min (by C-G formula based on Cr of 0.86). No results for input(s): VANCOTROUGH, VANCOPEAK, VANCORANDOM, GENTTROUGH, GENTPEAK, GENTRANDOM, TOBRATROUGH, TOBRAPEAK, TOBRARND, AMIKACINPEAK, AMIKACINTROU, AMIKACIN in the last 72 hours.   Microbiology: Recent Results (from the past 720 hour(s))  MRSA PCR Screening     Status: None   Collection Time: 05/14/15  6:48 AM  Result Value Ref Range Status   MRSA by PCR NEGATIVE NEGATIVE Final    Comment:        The GeneXpert MRSA Assay (FDA approved for  NASAL specimens only), is one component of a comprehensive MRSA colonization surveillance program. It is not intended to diagnose MRSA infection nor to guide or monitor treatment for MRSA infections.   Culture, respiratory (NON-Expectorated)     Status: None   Collection Time: 05/15/15 10:34 AM  Result Value Ref Range Status   Specimen Description TRACHEAL ASPIRATE  Final   Special Requests Normal  Final   Gram Stain   Final    RARE WBC PRESENT, PREDOMINANTLY PMN RARE SQUAMOUS EPITHELIAL CELLS PRESENT FEW GRAM POSITIVE COCCI IN PAIRS IN CHAINS RARE GRAM POSITIVE RODS Performed at [facility]    Culture   Final    FEW KLEBSIELLA PNEUMONIAE Performed at [facility]    Report Status 05/18/2015 FINAL  Final   Organism ID, Bacteria KLEBSIELLA PNEUMONIAE  Final      Susceptibility   Klebsiella pneumoniae - MIC*    AMPICILLIN >=32 RESISTANT Resistant     AMPICILLIN/SULBACTAM 4 SENSITIVE Sensitive     CEFAZOLIN <=4 SENSITIVE Sensitive     CEFEPIME <=1 SENSITIVE Sensitive     CEFTAZIDIME <=1 SENSITIVE Sensitive     CEFTRIAXONE <=1 SENSITIVE Sensitive     CIPROFLOXACIN <=0.25 SENSITIVE Sensitive     GENTAMICIN <=1 SENSITIVE Sensitive     IMIPENEM <=0.25 SENSITIVE Sensitive     PIP/TAZO <=4 SENSITIVE Sensitive     TOBRAMYCIN <=  1 SENSITIVE Sensitive     TRIMETH/SULFA <=20 SENSITIVE Sensitive     * FEW KLEBSIELLA PNEUMONIAE  Culture, blood (routine x 2)     Status: None (Preliminary result)   Collection Time: 05/15/15  4:15 PM  Result Value Ref Range Status   Specimen Description BLOOD LEFT HAND  Final   Special Requests BOTTLES DRAWN AEROBIC ONLY 3CC  Final   Culture   Final           BLOOD CULTURE RECEIVED NO GROWTH TO DATE CULTURE WILL BE HELD FOR 5 DAYS BEFORE ISSUING A FINAL NEGATIVE REPORT Performed at [facility]    Report Status PENDING  Incomplete  Culture, blood (routine x 2)     Status: None (Preliminary result)   Collection Time: 05/15/15   4:30 PM  Result Value Ref Range Status   Specimen Description BLOOD RIGHT HAND  Final   Special Requests BOTTLES DRAWN AEROBIC ONLY 3CC  Final   Culture   Final           BLOOD CULTURE RECEIVED NO GROWTH TO DATE CULTURE WILL BE HELD FOR 5 DAYS BEFORE ISSUING A FINAL NEGATIVE REPORT Performed at [facility]    Report Status PENDING  Incomplete    Anti-infectives    Start     Dose/Rate Route Frequency Ordered Stop   06/17/2015 0845  vancomycin (VANCOCIN) IVPB 750 mg/150 ml premix     750 mg 150 mL/hr over 60 Minutes Intravenous Every 24 hours 05/19/15 0831     05/19/15 0900  micafungin (MYCAMINE) 100 mg in sodium chloride 0.9 % 100 mL IVPB     100 mg 100 mL/hr over 1 Hours Intravenous Daily 05/19/15 0827     05/19/15 0845  imipenem-cilastatin (PRIMAXIN) 500 mg in sodium chloride 0.9 % 100 mL IVPB     500 mg 200 mL/hr over 30 Minutes Intravenous 3 times per day 05/19/15 0831     05/19/15 0830  vancomycin (VANCOCIN) 1,250 mg in sodium chloride 0.9 % 250 mL IVPB     1,250 mg 166.7 mL/hr over 90 Minutes Intravenous  Once 05/19/15 0831 05/19/15 1034   05/16/15 0800  ceFEPIme (MAXIPIME) 2 g in dextrose 5 % 50 mL IVPB  Status:  Discontinued     2 g 100 mL/hr over 30 Minutes Intravenous Every 12 hours 05/16/15 0716 05/19/15 0827   05/15/15 0900  ceFEPIme (MAXIPIME) 2 g in dextrose 5 % 50 mL IVPB  Status:  Discontinued     2 g 100 mL/hr over 30 Minutes Intravenous Every 24 hours 05/15/15 0832 05/16/15 0715      Assessment: 16 YOM admitted with pancytopenia, now with AKI and respiratory failure requiring intubation. Pharmacy consulted to manage abx for rule out HCAP and febrile neutropenia with sepsis. Sputum cx grew klebsiella. Pt was on cefepime since 5/19 but not getting any better, now to broaden and switch to vancomycin and imipenem. Pt is hypothermic on CRRT and WBC is low at 0.2.  Goal of Therapy:  Resolution of infection Vancomycin trough level 15-20 mcg/ml  Plan:  Stop  cefepime  Give vancomycin 1250mg  IV x 1, then start 750mg  IV Q24 Start imipenem 500mg  IV Q8 Start micafungin 100mg  IV Q24 (per MD) Monitor clinical picture, renal function, s/s of myasthenia gravis, CRRT tolerance, VT at Css F/U abx LOT  Daria Mcmeekin J 05/19/2015,11:29 AM

## 2015-05-19 NOTE — Progress Notes (Signed)
[place] Progress Note Patient Name: Nicholas [name] DOB: 10-07-1934 MRN: [id number]   Date of Service  05/19/2015  HPI/Events of Note  BP down again on both aline and cuff.  eICU Interventions  Will add vasopressin.     Intervention Category Major Interventions: Other:  Sayra Frisby 05/19/2015, 8:52 PM

## 2015-05-20 ENCOUNTER — Inpatient Hospital Stay (HOSPITAL_COMMUNITY): Payer: Medicare Other

## 2015-05-20 DIAGNOSIS — R4020 Unspecified coma: Secondary | ICD-10-CM

## 2015-05-20 DIAGNOSIS — R4182 Altered mental status, unspecified: Secondary | ICD-10-CM | POA: Insufficient documentation

## 2015-05-20 LAB — COMPREHENSIVE METABOLIC PANEL
ALT: 1886 U/L — ABNORMAL HIGH (ref 17–63)
AST: 4980 U/L — AB (ref 15–41)
Albumin: 1.4 g/dL — ABNORMAL LOW (ref 3.5–5.0)
Alkaline Phosphatase: 40 U/L (ref 38–126)
Anion gap: 16 — ABNORMAL HIGH (ref 5–15)
BILIRUBIN TOTAL: 11.6 mg/dL — AB (ref 0.3–1.2)
BUN: 7 mg/dL (ref 6–20)
CALCIUM: 6.7 mg/dL — AB (ref 8.9–10.3)
CO2: 12 mmol/L — ABNORMAL LOW (ref 22–32)
Chloride: 104 mmol/L (ref 101–111)
Creatinine, Ser: 0.65 mg/dL (ref 0.61–1.24)
Glucose, Bld: 112 mg/dL — ABNORMAL HIGH (ref 65–99)
POTASSIUM: 5.7 mmol/L — AB (ref 3.5–5.1)
SODIUM: 132 mmol/L — AB (ref 135–145)
Total Protein: 3.2 g/dL — ABNORMAL LOW (ref 6.5–8.1)

## 2015-05-20 LAB — CBC
HCT: 25.8 % — ABNORMAL LOW (ref 39.0–52.0)
Hemoglobin: 8.7 g/dL — ABNORMAL LOW (ref 13.0–17.0)
MCH: 31.3 pg (ref 26.0–34.0)
MCHC: 33.7 g/dL (ref 30.0–36.0)
MCV: 92.8 fL (ref 78.0–100.0)
RBC: 2.78 MIL/uL — ABNORMAL LOW (ref 4.22–5.81)
RDW: 15.7 % — AB (ref 11.5–15.5)
WBC: 0.2 10*3/uL — AB (ref 4.0–10.5)

## 2015-05-20 LAB — PREPARE PLATELET PHERESIS: Unit division: 0

## 2015-05-20 LAB — GLUCOSE, CAPILLARY
GLUCOSE-CAPILLARY: 77 mg/dL (ref 65–99)
GLUCOSE-CAPILLARY: 118 mg/dL — AB (ref 65–99)
Glucose-Capillary: 104 mg/dL — ABNORMAL HIGH (ref 65–99)
Glucose-Capillary: 179 mg/dL — ABNORMAL HIGH (ref 65–99)
Glucose-Capillary: 48 mg/dL — ABNORMAL LOW (ref 65–99)

## 2015-05-20 LAB — PHOSPHORUS: PHOSPHORUS: 4.8 mg/dL — AB (ref 2.5–4.6)

## 2015-05-20 LAB — POCT I-STAT 3, ART BLOOD GAS (G3+)
Acid-base deficit: 16 mmol/L — ABNORMAL HIGH (ref 0.0–2.0)
Bicarbonate: 9.9 mEq/L — ABNORMAL LOW (ref 20.0–24.0)
O2 SAT: 95 %
TCO2: 11 mmol/L (ref 0–100)
pCO2 arterial: 23.6 mmHg — ABNORMAL LOW (ref 35.0–45.0)
pH, Arterial: 7.232 — ABNORMAL LOW (ref 7.350–7.450)
pO2, Arterial: 90 mmHg (ref 80.0–100.0)

## 2015-05-20 LAB — APTT: APTT: 58 s — AB (ref 24–37)

## 2015-05-20 LAB — MAGNESIUM: Magnesium: 2.5 mg/dL — ABNORMAL HIGH (ref 1.7–2.4)

## 2015-05-20 MED ORDER — PHENYLEPHRINE HCL 10 MG/ML IJ SOLN
30.0000 ug/min | INTRAVENOUS | Status: DC
  Administered 2015-05-20: 90 ug/min via INTRAVENOUS
  Administered 2015-05-20: 100 ug/min via INTRAVENOUS
  Filled 2015-05-20 (×4): qty 1

## 2015-05-20 MED ORDER — MORPHINE SULFATE 25 MG/ML IV SOLN
10.0000 mg/h | INTRAVENOUS | Status: DC
  Filled 2015-05-20: qty 10

## 2015-05-20 MED ORDER — MORPHINE BOLUS VIA INFUSION
5.0000 mg | INTRAVENOUS | Status: DC | PRN
  Filled 2015-05-20: qty 20

## 2015-05-20 MED ORDER — SODIUM CHLORIDE 0.9 % IV BOLUS (SEPSIS)
500.0000 mL | Freq: Once | INTRAVENOUS | Status: AC
  Administered 2015-05-20: 500 mL via INTRAVENOUS

## 2015-05-21 ENCOUNTER — Telehealth: Payer: Self-pay

## 2015-05-21 LAB — CULTURE, BLOOD (ROUTINE X 2)
CULTURE: NO GROWTH
Culture: NO GROWTH

## 2015-05-21 NOTE — Telephone Encounter (Signed)
On 05/21/2015 I received a death certificate from [place]. This patient is a patient of Doctor McQuaid. This certificate is for burial. The certificate will be taken to [facility] via courier tomorrow am to [facility] for Doctor McQuaid to sign the death certificate.  On 2015-06-03 I received the death certificate back completed from the hospital. I called the funeral home to let them know the death certificate was ready for pickup. I faxed a copy to the funeral home per their request and put the original in the folder in medical records.

## 2015-05-22 LAB — GLUCOSE, CAPILLARY: Glucose-Capillary: 38 mg/dL — CL (ref 65–99)

## 2015-05-25 LAB — CULTURE, BLOOD (ROUTINE X 2)

## 2015-05-27 ENCOUNTER — Encounter: Payer: Self-pay | Admitting: Internal Medicine

## 2015-05-28 NOTE — Progress Notes (Signed)
[facility] ROUNDING NOTE   Subjective:   Interval History: reviewed Dr Floyde Parkins note and will sign off   Objective:  Vital signs in last 24 hours:  Temp:  [88.6 F (31.4 C)-89.5 F (31.9 C)] 88.7 F (31.5 C) (05/24 0350) Pulse Rate:  [34-78] 78 (05/24 0315) Resp:  [0-29] 18 (05/24 0700) BP: (48-209)/(18-177) 108/80 mmHg (05/24 0700) SpO2:  [61 %-100 %] 82 % (05/24 0315) Arterial Line BP: (27-111)/(4-65) 78/56 mmHg (05/24 0700) FiO2 (%):  [30 %] 30 % (05/24 0841) Weight:  [73.8 kg (162 lb 11.2 oz)] 73.8 kg (162 lb 11.2 oz) (05/24 0326)  Weight change: 2.1 kg (4 lb 10.1 oz) Filed Weights   05/18/15 0415 05/19/15 0400 06/09/15 0326  Weight: 71.3 kg (157 lb 3 oz) 71.7 kg (158 lb 1.1 oz) 73.8 kg (162 lb 11.2 oz)    Intake/Output: I/O last 3 completed shifts: In: 5258.4 [I.V.:3918.4; NG/GT:40; IV Piggyback:1300] Out: 3536 [Other:1358; Stool:75]   Intake/Output this shift:     CVS- RRR RS- CTA ABD- BS present soft non-distended EXT- no edema   Basic Metabolic Panel:  Recent Labs Lab 05/15/15 1950  05/17/15 0359  05/18/15 0515 05/18/15 1614 05/19/15 0450 05/19/15 1600 2015-06-09 0302  NA  --   < > 137  < > 135 135 133* 135 132*  K  --   < > 3.8  < > 4.2 4.2 4.8 5.0 5.7*  CL  --   < > 103  < > 101 105 101 104 104  CO2  --   < > 25  < > 24 23 18* 13* 12*  GLUCOSE  --   < > 125*  < > 136* 163* 139* 56* 112*  BUN  --   < > 37*  < > 24* 20 18 13 7   CREATININE  --   < > 2.03*  < > 1.21 0.91 0.86 0.82 0.65  CALCIUM  --   < > 7.5*  < > 7.7* 7.4* 7.6* 7.3* 6.7*  MG 1.7  --  2.0  --  2.4  --  2.5*  --  2.5*  PHOS 4.6  < > 2.4*  < > 2.7 4.4 3.9 4.8* 4.8*  < > = values in this interval not displayed.  Liver Function Tests:  Recent Labs Lab 05/15/15 1215  05/17/15 0359  05/18/15 0515 05/18/15 1614 05/19/15 0450 05/19/15 1600 Jun 09, 2015 0302  AST 155*  --  482*  --   --   --  179*  --  4980*  ALT 81*  --  332*  --   --   --  341*  --  1886*   ALKPHOS 30*  --  24*  --   --   --  31*  --  40  BILITOT 5.6*  --  8.1*  --   --   --  10.3*  --  11.6*  PROT 5.2*  --  4.4*  --   --   --  4.0*  --  3.2*  ALBUMIN 2.3*  < > 1.7*  < > 1.7* 1.6* 1.7* 1.5* 1.4*  < > = values in this interval not displayed. No results for input(s): LIPASE, AMYLASE in the last 168 hours. No results for input(s): AMMONIA in the last 168 hours.  CBC:  Recent Labs Lab 04/27/2015 2348  05/16/15 0431 05/16/15 1600 05/17/15 0359 05/18/15 0515 05/19/15 0450 06/09/15 0302  WBC 0.1*  < > 0.1* 0.2* 0.2* 0.3* 0.2* 0.2*  NEUTROABS 0.0*  --  0.0*  --  0.0*  --   --   --   HGB 6.7*  < > 8.7* 9.0* 9.3* 10.2* 10.7* 8.7*  HCT 20.8*  < > 25.6* 25.9* 26.5* 28.6* 30.7* 25.8*  MCV 102.0*  < > 94.1 89.9 90.1 88.3 88.5 92.8  PLT 8*  < > <5* 9* 9* <5* 8* <5*  < > = values in this interval not displayed.  Cardiac Enzymes:  Recent Labs Lab 05/16/15 0215 05/16/15 1213 05/16/15 1600 05/17/15 0105 05/17/15 0815  TROPONINI 3.07* 2.49* 1.65* 0.93* 0.71*    BNP: Invalid input(s): POCBNP  CBG:  Recent Labs Lab 05/19/15 2339 06/06/2015 2015/06/06 0224 Jun 06, 2015 0334 June 06, 2015 0807  GLUCAP 48* 179* 56 104* 118*    Microbiology: Results for orders placed or performed during the hospital encounter of 04/28/2015  MRSA PCR Screening     Status: None   Collection Time: 05/14/15  6:48 AM  Result Value Ref Range Status   MRSA by PCR NEGATIVE NEGATIVE Final    Comment:        The GeneXpert MRSA Assay (FDA approved for NASAL specimens only), is one component of a comprehensive MRSA colonization surveillance program. It is not intended to diagnose MRSA infection nor to guide or monitor treatment for MRSA infections.   Culture, respiratory (NON-Expectorated)     Status: None   Collection Time: 05/15/15 10:34 AM  Result Value Ref Range Status   Specimen Description TRACHEAL ASPIRATE  Final   Special Requests Normal  Final   Gram Stain   Final    RARE WBC PRESENT,  PREDOMINANTLY PMN RARE SQUAMOUS EPITHELIAL CELLS PRESENT FEW GRAM POSITIVE COCCI IN PAIRS IN CHAINS RARE GRAM POSITIVE RODS Performed at [facility]    Culture   Final    FEW KLEBSIELLA PNEUMONIAE Performed at [facility]    Report Status 05/18/2015 FINAL  Final   Organism ID, Bacteria KLEBSIELLA PNEUMONIAE  Final      Susceptibility   Klebsiella pneumoniae - MIC*    AMPICILLIN >=32 RESISTANT Resistant     AMPICILLIN/SULBACTAM 4 SENSITIVE Sensitive     CEFAZOLIN <=4 SENSITIVE Sensitive     CEFEPIME <=1 SENSITIVE Sensitive     CEFTAZIDIME <=1 SENSITIVE Sensitive     CEFTRIAXONE <=1 SENSITIVE Sensitive     CIPROFLOXACIN <=0.25 SENSITIVE Sensitive     GENTAMICIN <=1 SENSITIVE Sensitive     IMIPENEM <=0.25 SENSITIVE Sensitive     PIP/TAZO <=4 SENSITIVE Sensitive     TOBRAMYCIN <=1 SENSITIVE Sensitive     TRIMETH/SULFA <=20 SENSITIVE Sensitive     * FEW KLEBSIELLA PNEUMONIAE  Culture, blood (routine x 2)     Status: None (Preliminary result)   Collection Time: 05/15/15  4:15 PM  Result Value Ref Range Status   Specimen Description BLOOD LEFT HAND  Final   Special Requests BOTTLES DRAWN AEROBIC ONLY 3CC  Final   Culture   Final           BLOOD CULTURE RECEIVED NO GROWTH TO DATE CULTURE WILL BE HELD FOR 5 DAYS BEFORE ISSUING A FINAL NEGATIVE REPORT Performed at [facility]    Report Status PENDING  Incomplete  Culture, blood (routine x 2)     Status: None (Preliminary result)   Collection Time: 05/15/15  4:30 PM  Result Value Ref Range Status   Specimen Description BLOOD RIGHT HAND  Final   Special Requests BOTTLES DRAWN AEROBIC ONLY 3CC  Final  Culture   Final           BLOOD CULTURE RECEIVED NO GROWTH TO DATE CULTURE WILL BE HELD FOR 5 DAYS BEFORE ISSUING A FINAL NEGATIVE REPORT Performed at [facility]    Report Status PENDING  Incomplete  Culture, blood (routine x 2)     Status: None (Preliminary result)   Collection Time: 05/19/15  10:03 AM  Result Value Ref Range Status   Specimen Description BLOOD RIGHT HAND  Final   Special Requests BOTTLES DRAWN AEROBIC ONLY  4CC  Final   Culture   Final           BLOOD CULTURE RECEIVED NO GROWTH TO DATE CULTURE WILL BE HELD FOR 5 DAYS BEFORE ISSUING A FINAL NEGATIVE REPORT Performed at [facility]    Report Status PENDING  Incomplete    Coagulation Studies: No results for input(s): LABPROT, INR in the last 72 hours.  Urinalysis: No results for input(s): COLORURINE, LABSPEC, PHURINE, GLUCOSEU, HGBUR, BILIRUBINUR, KETONESUR, PROTEINUR, UROBILINOGEN, NITRITE, LEUKOCYTESUR in the last 72 hours.  Invalid input(s): APPERANCEUR    Imaging: Dg Chest Port 1 View  2015/05/22   CLINICAL DATA:  Pulmonary infiltrates.  Shortness of breath.  EXAM: PORTABLE CHEST - 1 VIEW  COMPARISON:  05/18/2015.  FINDINGS: Endotracheal tube, NG to right IJ line in stable position. Cardiac pacer stable position. Prior CABG. Heart size stable. No pulmonary venous congestion. Persistent right lower lobe infiltrate and right pleural effusion. No pneumothorax. Left shoulder replacement.  IMPRESSION: 1. Support apparatus in stable position. 2. Persistent right lower lobe infiltrate and right pleural effusion. 3. Cardiac pacer. Prior CABG. Heart size stable. No pulmonary venous congestion.   Electronically Signed   By: [name]  Register   On: 05/22/2015 07:04     Medications:   . feeding supplement (VITAL AF 1.2 CAL) Stopped (05/16/15 0800)  . fentaNYL infusion INTRAVENOUS 50 mcg/hr (05/19/15 1731)  . morphine    . norepinephrine (LEVOPHED) Adult infusion 50 mcg/min (22-May-2015 0557)  . phenylephrine (NEO-SYNEPHRINE) Adult infusion 90 mcg/min (05/22/2015 0933)  . dialysis replacement fluid (prismasate) 800 mL/hr at 05-22-15 0933  . dialysis replacement fluid (prismasate) 700 mL/hr at 2015-05-22 0644  . dialysate (PRISMASATE) 1,500 mL/hr at May 22, 2015 0430  . vasopressin (PITRESSIN) infusion - *FOR SHOCK*  0.03 Units/min (05/19/15 2103)   . sodium chloride   Intravenous Once  . antiseptic oral rinse  7 mL Mouth Rinse QID  . chlorhexidine  15 mL Mouth Rinse BID  . dextrose  50 mL Intravenous Once  . fentaNYL (SUBLIMAZE) injection  50 mcg Intravenous Once  . ferrous sulfate  220 mg Per Tube BID WC  . hydrocortisone sodium succinate  50 mg Intravenous Q6H  . imipenem-cilastatin  500 mg Intravenous 3 times per day  . micafungin [facility]) IV  100 mg Intravenous Daily  . pantoprazole (PROTONIX) IV  40 mg Intravenous Q24H  . pyridostigmine  30 mg Per Tube 3 times per day  . sodium chloride  3 mL Intravenous Q12H  . Tbo-Filgrastim  480 mcg Subcutaneous Daily  . vancomycin  750 mg Intravenous Q24H   sodium chloride, acetaminophen, albuterol, fentaNYL, fentaNYL (SUBLIMAZE) injection, heparin, morphine, sodium chloride, sodium chloride  Assessment/ Plan:  CKD/AKI CRRT at this point unable to tolerate volume removal  2 Anemia Hemoglobin stable 3 Pancytopenia Marrow aplasia and treated with growth factor 4 VDRF Trach and ventilated 5 Sepsis imipenem and vanco and mycofungin Growth positive for klebsiella sputum  6 Myathenia Gravis  Will stop CRRT   LOS: 6 Nicholas [name] W @TODAY @12 :37 PM

## 2015-05-28 NOTE — Progress Notes (Signed)
CRITICAL VALUE ALERT  Critical value received:  plts < 5  And K 5.7  Date of notification:  06/06/2015  Time of notification: 0351  Critical value read back:Yes.    Nurse who received alert:  Allegra Grana, RN  MD notified (1st page):  Dr Deterding  Time of first page:  [phone]  MD notified (2nd page):  Time of second page:  Responding MD:  Dr Jimmy Footman  Time MD responded:  [phone]

## 2015-05-28 NOTE — Progress Notes (Signed)
Patient terminally extubated. Patient was deceased on extubation.  Family at bedside.

## 2015-05-28 NOTE — Progress Notes (Signed)
[place] Progress Note Patient Name: Nicholas [name] DOB: [birth year] MRN: [id number]   Date of Service  06-17-2015  HPI/Events of Note  Continued issues with hypotension but concern that aline is not accurate.  Patient is on NE gtt at 40 mcg and vasopressin shock dose.  Current aline pressure of 58/46 (52) and cuff pressure of 92/46 (59).  eICU Interventions  Plan Adjust NE gtt to max of 50 mcg Hold on placement of new aline due to plt count of 8K     Intervention Category Major Interventions: Hypotension - evaluation and management  DETERDING,ELIZABETH 06-17-2015, 2:08 AM

## 2015-05-28 NOTE — Progress Notes (Signed)
Reginal Lutes MD and made them aware pt is having consistently low BP with Aline AND cuff BP. Pt is more unarousable this am. New orders received.  Also called pts wife and gave her an update this am. Pts wife stated she and her daughter would be coming to the hospital now.   Will continue to monitor closely.

## 2015-05-28 NOTE — Progress Notes (Signed)
PULMONARY / CRITICAL CARE MEDICINE   Name: Nicholas [name] MRN: [id number] DOB: 05-08-1934    ADMISSION DATE:  05/21/2015 CONSULTATION DATE:  05/15/2015  REFERRING MD :  Tana Coast  CHIEF COMPLAINT:  Acute confusion  INITIAL PRESENTATION: 81 y/o male admitted on 5/18 to [facility] with pancytopenia and dyspnea.  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  STUDIES:  5/19 CT head >> NAICP 5/18 ECHO >> LVEF 20-25%, inferior WMA's, mild AI, mild MR, LAE, RAE  SIGNIFICANT EVENTS: 5/19  Early AM became unresponsive, required intubation 5/20  Worsening shock, lactic acidosis, started on BICAB GTT, neurology, cardiology and nephrology consulted 5/21  Remains on CVVHD, goal net neg 50. Received 1 unit PRBC's 5/23 maxed pressors  SUBJECTIVE:  Rising pressors, additional pressors and fluids given  VITAL SIGNS: Temp:  [88.6 F (31.4 C)-92.7 F (33.7 C)] 88.7 F (31.5 C) (05/24 0350) Pulse Rate:  [34-78] 78 (05/24 0315) Resp:  [0-29] 18 (05/24 0700) BP: (48-209)/(18-177) 108/80 mmHg (05/24 0700) SpO2:  [61 %-100 %] 82 % (05/24 0315) Arterial Line BP: (27-111)/(4-65) 78/56 mmHg (05/24 0700) FiO2 (%):  [30 %] 30 % (05/24 0314) Weight:  [73.8 kg (162 lb 11.2 oz)] 73.8 kg (162 lb 11.2 oz) (05/24 0326)   HEMODYNAMICS: CVP:  [2 mmHg] 2 mmHg   VENTILATOR SETTINGS: Vent Mode:  [-] PRVC FiO2 (%):  [30 %] 30 % Set Rate:  [18 bmp] 18 bmp Vt Set:  [580 mL] 580 mL PEEP:  [5 cmH20] 5 cmH20 Plateau Pressure:  [18 cmH20] 18 cmH20   INTAKE / OUTPUT:  Intake/Output Summary (Last 24 hours) at 06/04/15 0830 Last data filed at 06-04-2015 0700  Gross per 24 hour  Intake 4111.02 ml  Output    812 ml  Net 3299.02 ml    PHYSICAL EXAMINATION: General:  Frail, chronically ill male in NAD on vent  Neuro: rass 0 ,not  follows commands HEENT:  Line clean, jvd up Cardiovascular:  s1s2 rrr, no m/r/g, no jvd Lungs: coarse bilateral increased GI: BS+, soft, non tender, no r/g Musculoskeletal:  Decreased bulk,  wasting  Skin:  No rash or skin breakdown  LABS:  CBC  Recent Labs Lab 05/18/15 0515 05/19/15 0450 06-04-15 0302  WBC 0.3* 0.2* 0.2*  HGB 10.2* 10.7* 8.7*  HCT 28.6* 30.7* 25.8*  PLT <5* 8* <5*   Coag's  Recent Labs Lab 05/27/2015 2348 05/15/15 1215  05/18/15 0515 05/19/15 0450 Jun 04, 2015 0302  APTT  --   --   < > 41* 42* 58*  INR 1.64* 2.27*  --   --   --   --   < > = values in this interval not displayed. BMET  Recent Labs Lab 05/19/15 0450 05/19/15 1600 06/04/15 0302  NA 133* 135 132*  K 4.8 5.0 5.7*  CL 101 104 104  CO2 18* 13* 12*  BUN 18 13 7   CREATININE 0.86 0.82 0.65  GLUCOSE 139* 56* 112*   Electrolytes  Recent Labs Lab 05/18/15 0515  05/19/15 0450 05/19/15 1600 06-04-2015 0302  CALCIUM 7.7*  < > 7.6* 7.3* 6.7*  MG 2.4  --  2.5*  --  2.5*  PHOS 2.7  < > 3.9 4.8* 4.8*  < > = values in this interval not displayed.   Sepsis Markers  Recent Labs Lab 05/15/15 1745 05/16/15 0025 05/16/15 0430  LATICACIDVEN 7.8* 7.6* 6.6*   ABG  Recent Labs Lab 05/15/15 1050 05/19/15 0851 06/04/15 0231  PHART 7.305* 7.346* 7.232*  PCO2ART 30.0* 32.1* 23.6*  PO2ART 444* 106.0* 90.0   Liver Enzymes  Recent Labs Lab 05/17/15 0359  05/19/15 0450 05/19/15 1600 2015/06/13 0302  AST 482*  --  179*  --  4980*  ALT 332*  --  341*  --  1886*  ALKPHOS 24*  --  31*  --  40  BILITOT 8.1*  --  10.3*  --  11.6*  ALBUMIN 1.7*  < > 1.7* 1.5* 1.4*  < > = values in this interval not displayed.   Cardiac Enzymes  Recent Labs Lab 05/16/15 1600 05/17/15 0105 05/17/15 0815  TROPONINI 1.65* 0.93* 0.71*   Glucose  Recent Labs Lab 05/19/15 1646 05/19/15 1933 05/19/15 2339 2015/06/13 Jun 13, 2015 0224 June 13, 2015 0334  GLUCAP 124* 82 48* 179* 77 104*    Imaging  5/22 CXR >> improved L effusion/atx, small R effusion  None new  ASSESSMENT / PLAN:  NEUROLOGIC A:   Acute encephalopathy - improved, CT head negative  Myasthenia - does not appear to be flaring  right now P:   RASS goal: 0 Fentanyl gtt, with WUA Continue mestinon Consider comfort care  PULMONARY OETT 5/19 >> A: Acute respiratory failure with hypoxemia due to CHF exacerbation, DDX includes HCAP, was febrile 5/18 Pleural Effusions  Basilar ATX R./o rt base infiltrate P:   abg reviewed, if continued support, will increase rate vent, repeat abg pcxr in am for rt base  CARDIOVASCULAR CVL 5/19 >> A:  Acute decompensated CHF - LVEF 20-25% which is new with new WMA's, ddx includes ischemia but EKG non-specific on admission Demand ischemia Cardiogenic shock - complicated by possible sepsis and anemia, currently afebrile  Afib  Refractory shock P:  NO role neo in setting levo high dose Vaso maintain Map 60 Correct ph as able  RENAL A:   Acute on chronic renal insufficiency - oliguric 5/20, CVVHD started P:   CVVHD per Nephrology  Even balance cvp 3, consider bolus further No role bicarb Correct ph for k   GASTROINTESTINAL A:   Abnormal LFTs with elevated direct bili > no clear source of hepatic congestion, may be related to heart failure cholestasis  Shock liver P:   OG tube ppi Continue tube feedings, restart lft follow up Map support If aggressive would fractionate  HEMATOLOGIC A:   Acute pancytopenia - on admission, most likely due to Imuran in setting of renal failure.  S/P 1 unit PRBC 5/21 (shock) Neutropenia BM suppresion P:  Assess retic count  scd Daily CBC Neupogen given, consider to continue  INFECTIOUS A:   Neutropenic fever with infiltrates, could be HCAP but favor pulm edema as cause of CXR abnormalities P:   BCx2 5/19 >> Sputum 5/19 >> klebsiella >> R- ampicillin, otherwise sens  Cefepime 5/19 >>5/23 Imipenem 5/23>>> mycofungin 5/23>>> vanc 5/23>>  Keep aggressive new coverage Poor prognosis  ENDOCRINE A:   Hypoglycemia  Adrenal insuff P:   Monitor glucose closely, wnl Steroids stress remain  FAMILY  - Updates:  Wife & daughter updated at bedside.  They indicate he has been making statements that he "is tired and doesn't want to keep going to the doctors".  He also had refused home health in the recent past.  He has had a prolonged course of illness and unfortunately is not making progress.  They do not want prolonged suffering for him but want to give him a reasonable chance of recovery.  In the event of cardiac arrest, they do not want CPR or defib.  At present, they want to continue current medical care but he does not make progress or declines (in the next 24-48 hours), they would be open to further discussion for comfort care.    Will discuss now to consider comfort care  Ccm time 30 min   Lavon Paganini. Titus Mould, MD, [place] Pgr: [facility] [facility]

## 2015-05-28 NOTE — Progress Notes (Signed)
Patient ID: Nicholas [name], male   DOB: [birth year], 81 y.o.   MRN: [id number] Extensive discussion with family wife  We discussed the poor prognosis and likely poor quality of life. Family has decided to offer full comfort care. They are aware that the patient may be transferred to palliative care floor for continued comfort care needs. They have been fully updated on the process and expectations.  Lavon Paganini. Titus Mould, MD, [place] Pgr: [place] [facility]

## 2015-05-28 NOTE — Progress Notes (Signed)
Hypoglycemic Event  CBG: 48  Treatment: D50 IV 50 mL  Symptoms: None  Follow-up CBG: Time: 0000 CBG Result: 179  Possible Reasons for Event: Unknown  Comments/MD notified:     Penne Rosenstock P Cari Vandeberg  Remember to initiate Hypoglycemia Order Set & complete

## 2015-05-28 NOTE — Progress Notes (Addendum)
Chaplain responded to consult for End of Life.  Family in room at bedside grieving appropriately.  Family shared that they are waiting for one more family member to arrive in approximately 45 minutes.  Chaplain provided emotional support and will follow up with family upon withdrawal of life support and implementation of full comfort care.    2015/06/17 0900  Clinical Encounter Type  Visited With Patient and family together  Visit Type Initial;Spiritual support;Critical Care  Referral From Physician  Spiritual Encounters  Spiritual Needs Emotional;Grief support  Stress Factors  Patient Stress Factors Health changes  Family Stress Factors Loss   Geralyn Flash 06-17-15 9:36 AM

## 2015-05-28 NOTE — Progress Notes (Signed)
Called over to [facility] and spoke with RN. Pts CBG is 77, 2 hours after being treated for a hypoglycemic event at midnight. Also called in critical results on ABG. Will continue to monitor.

## 2015-05-28 NOTE — Progress Notes (Signed)
[place] Progress Note Patient Name: Nicholas [name] DOB: October 11, 1934 MRN: [id number]   Date of Service  06/01/2015  HPI/Events of Note  Continued hypotension despite starting vasopressin earlier.  Now with BP of 42/37 (39)  eICU Interventions  Plan: D/C amio gtt Start NEO gtt for BP support NS 500 cc bolus     Intervention Category Major Interventions: Hypotension - evaluation and management  Nivedita Mirabella June 01, 2015, 5:33 AM

## 2015-05-28 DEATH — deceased

## 2015-06-05 ENCOUNTER — Ambulatory Visit: Payer: Medicare Other | Admitting: Cardiology

## 2015-06-05 ENCOUNTER — Other Ambulatory Visit: Payer: Medicare Other

## 2015-06-10 ENCOUNTER — Ambulatory Visit: Payer: Medicare Other | Admitting: Oncology

## 2015-06-10 ENCOUNTER — Other Ambulatory Visit: Payer: Medicare Other

## 2015-06-10 NOTE — Discharge Summary (Signed)
NAME:  Nicholas [name], Nicholas [name] NO.:  [id number]  MEDICAL RECORD NO.:  [id number]  LOCATION:  2M09C                        FACILITY:  [facility]  PHYSICIAN:  Raylene Miyamoto, MD DATE OF BIRTH:  23-Mar-1934  DATE OF ADMISSION:  04/30/2015 DATE OF DISCHARGE:  May 31, 2015                              DISCHARGE SUMMARY   DEATH SUMMARY  This is a 81 year old male, admitted on May 14, 2015 to [facility] secondary to pancytopenia and shortness of breath.  He had a history of CHF and myasthenia, on Imuran.  Studies done during the patient's hospitalization included a head CT with echocardiogram, which showed an ejection fraction of 20-25% with some inferior wall motion abnormalities, mild aortic insufficiency.  Significant events for the patient's hospitalization including early a.m. on May 15, 2015, becoming unresponsive, requiring intubation.  On May 16, 2015, the patient had worsening shock, lactic acidosis, start on bicarb drip.  Neurology was consulted, Cardiology and Nephrology.  On May 17, 2015, the patient remained on CVVHD, __________ -50 mL with __________ blood.  On May 19, 2015, the patient was maxed on pressors with refractory shock.  The patient up rising pressor, needs additional pressors and fluids were given.  Essentially, the patient was doing very very poorly and multiorgan failure.  He was being treated for his acute respiratory failure, had pleural effusions, we were increasing the rate in the ventilator machine to compensate for his metabolic acidosis.  He was maxed on vasopressin and Levophed therapy secondary to refractory shock. CVVHD was ongoing.  Family could see this patient, was not going to survive.  He was doing poorly prior to this admission and now had developed refractory shock, concerns of pancytopenia related to Imuran. Hematology had seen the patient and Neupogen has been administered. Infectious Disease wise, the patient was  found to have sputum with Klebsiella, being treated for concerns for HCAP in the setting of neutropenic fevers with infiltrates.  The patient also had widening and broadening of antibiotics on May 19, 2015, secondary to worsening clinical status, with imipenem, micafungin and vancomycin.  Family decided for comfort care based on his poor status, comfort care was provided and the patient expired.  FINAL DIAGNOSES UPON DEATH: 1. Likely healthcare-associated pneumonia with Klebsiella. 2. Neutropenia with neutropenic fevers, immunocompromised host. 3. Acute respiratory failure. 4. Acute renal failure. 5. Refractory shock. 6. Myasthenia gravis.     Raylene Miyamoto, MD    DJF/MEDQ  D:  06/09/2015  T:  06/10/2015  Job:  [id number]

## 2015-06-23 ENCOUNTER — Ambulatory Visit: Payer: Medicare Other | Admitting: Neurology

## 2015-06-25 ENCOUNTER — Ambulatory Visit: Payer: Medicare Other | Admitting: Cardiology

## 2015-07-02 ENCOUNTER — Ambulatory Visit: Payer: Medicare Other | Admitting: Podiatry

## 2016-06-11 ENCOUNTER — Other Ambulatory Visit: Payer: Self-pay | Admitting: Nurse Practitioner

## 2016-06-25 IMAGING — CR DG CHEST 1V PORT
1 series · 1 of 1 positions shown · non-contrast
Comparison: Same day.

CLINICAL DATA: Central line placement.

EXAM:
PORTABLE CHEST - 1 VIEW

[AP]
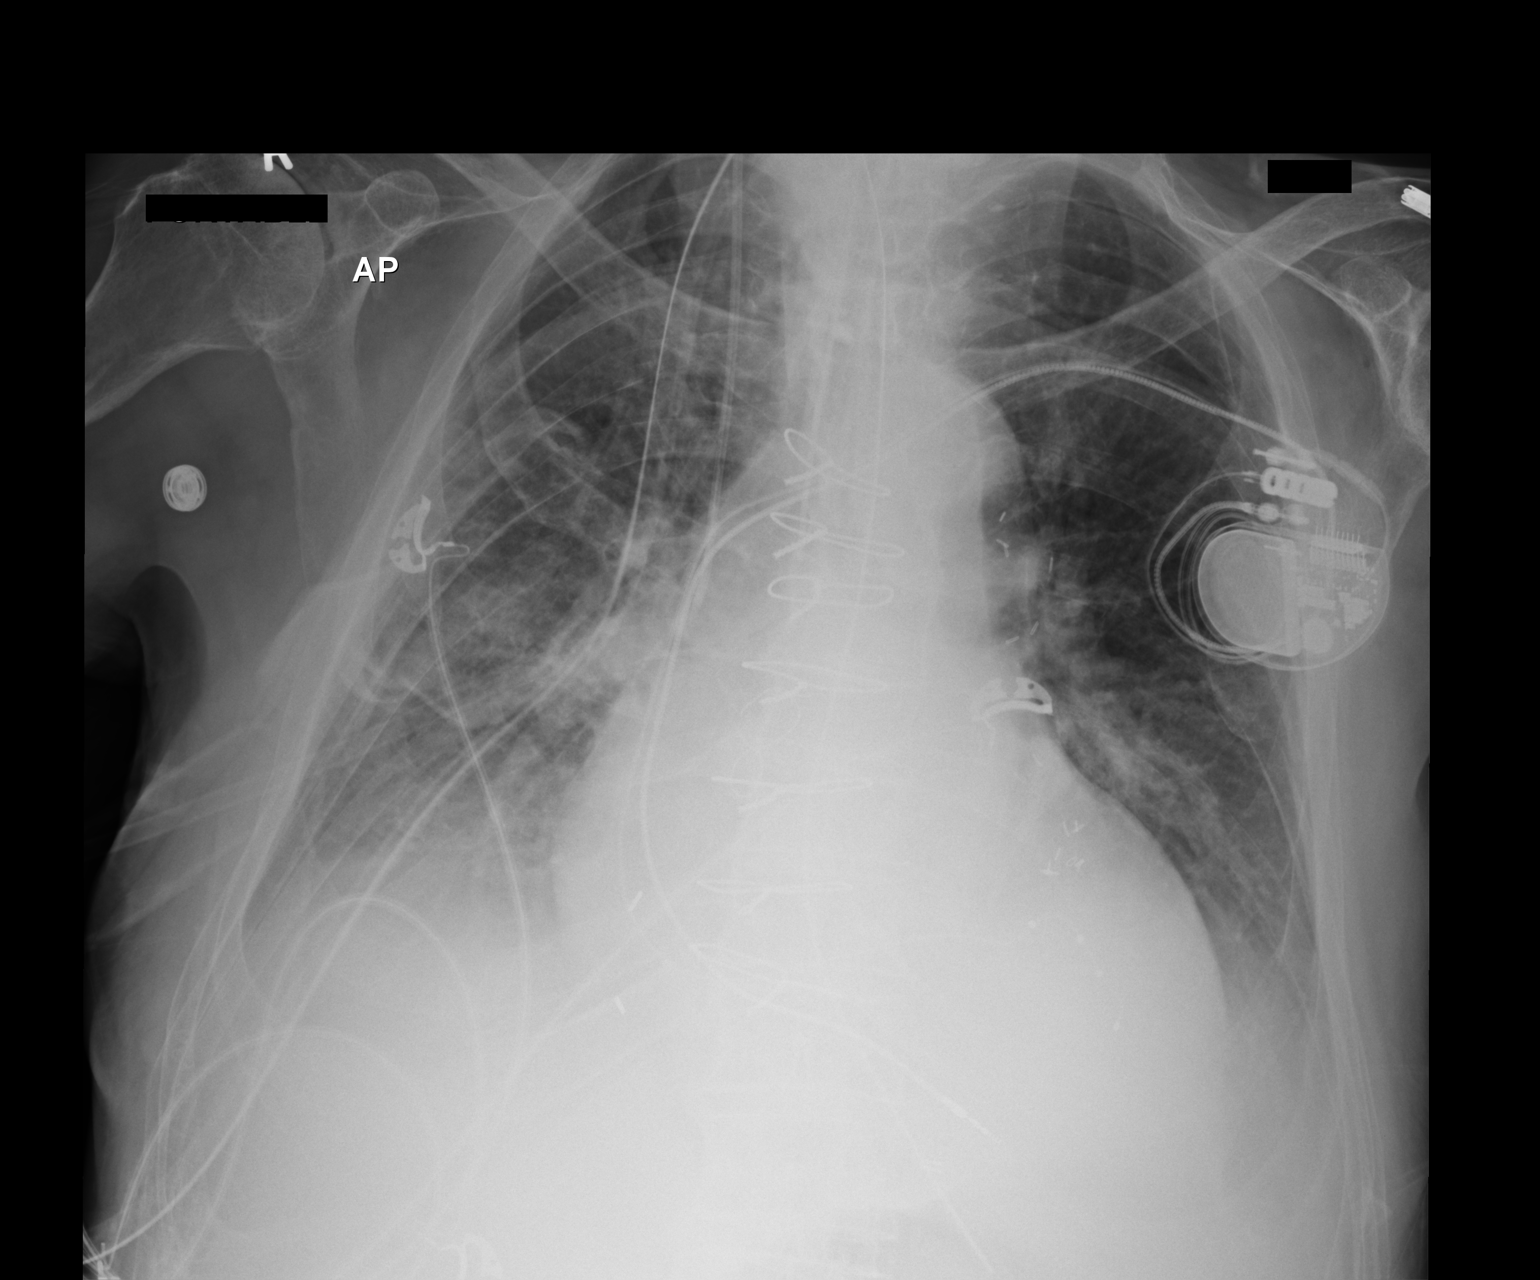

[1 of 1 positions shown; findings below may reference images not displayed]

FINDINGS: Stable cardiomediastinal silhouette. Stable bibasilar opacities are
noted concerning for atelectasis or edema with mild associated
pleural effusions. No definite pneumothorax is noted. Left-sided
pacemaker is unchanged in position. Status post coronary artery
bypass graft. Interval placement of right internal jugular catheter
line with distal tip overlying expected position of the SVC.
Endotracheal tube has advanced with distal tip less than 1 cm above
the carina. Nasogastric tube tip is seen entering stomach.
IMPRESSION: Interval placement of right internal jugular catheter with distal
tip overlying expected position of the SVC. No pneumothorax is
noted. Stable bibasilar opacities are noted concerning for
atelectasis or edema with associated mild pleural effusions.

Endotracheal tube is now seen with distal tip less than 1 cm above
the carina. Withdrawal by 2-3 cm is recommended. Critical
Value/emergent results were called by telephone at the time of
interpretation on 05/15/2015 at [DATE] to Isabel Carolina Gris, the
patient's nurse, who verbally acknowledged these results.

## 2016-06-25 IMAGING — DX DG CHEST 1V PORT
1 series · 1 of 1 positions shown · non-contrast
Comparison: 05/15/2015

CLINICAL DATA: Status post endotracheal tube and nasogastric
catheter placement

EXAM:
PORTABLE CHEST - 1 VIEW

[chest ap]
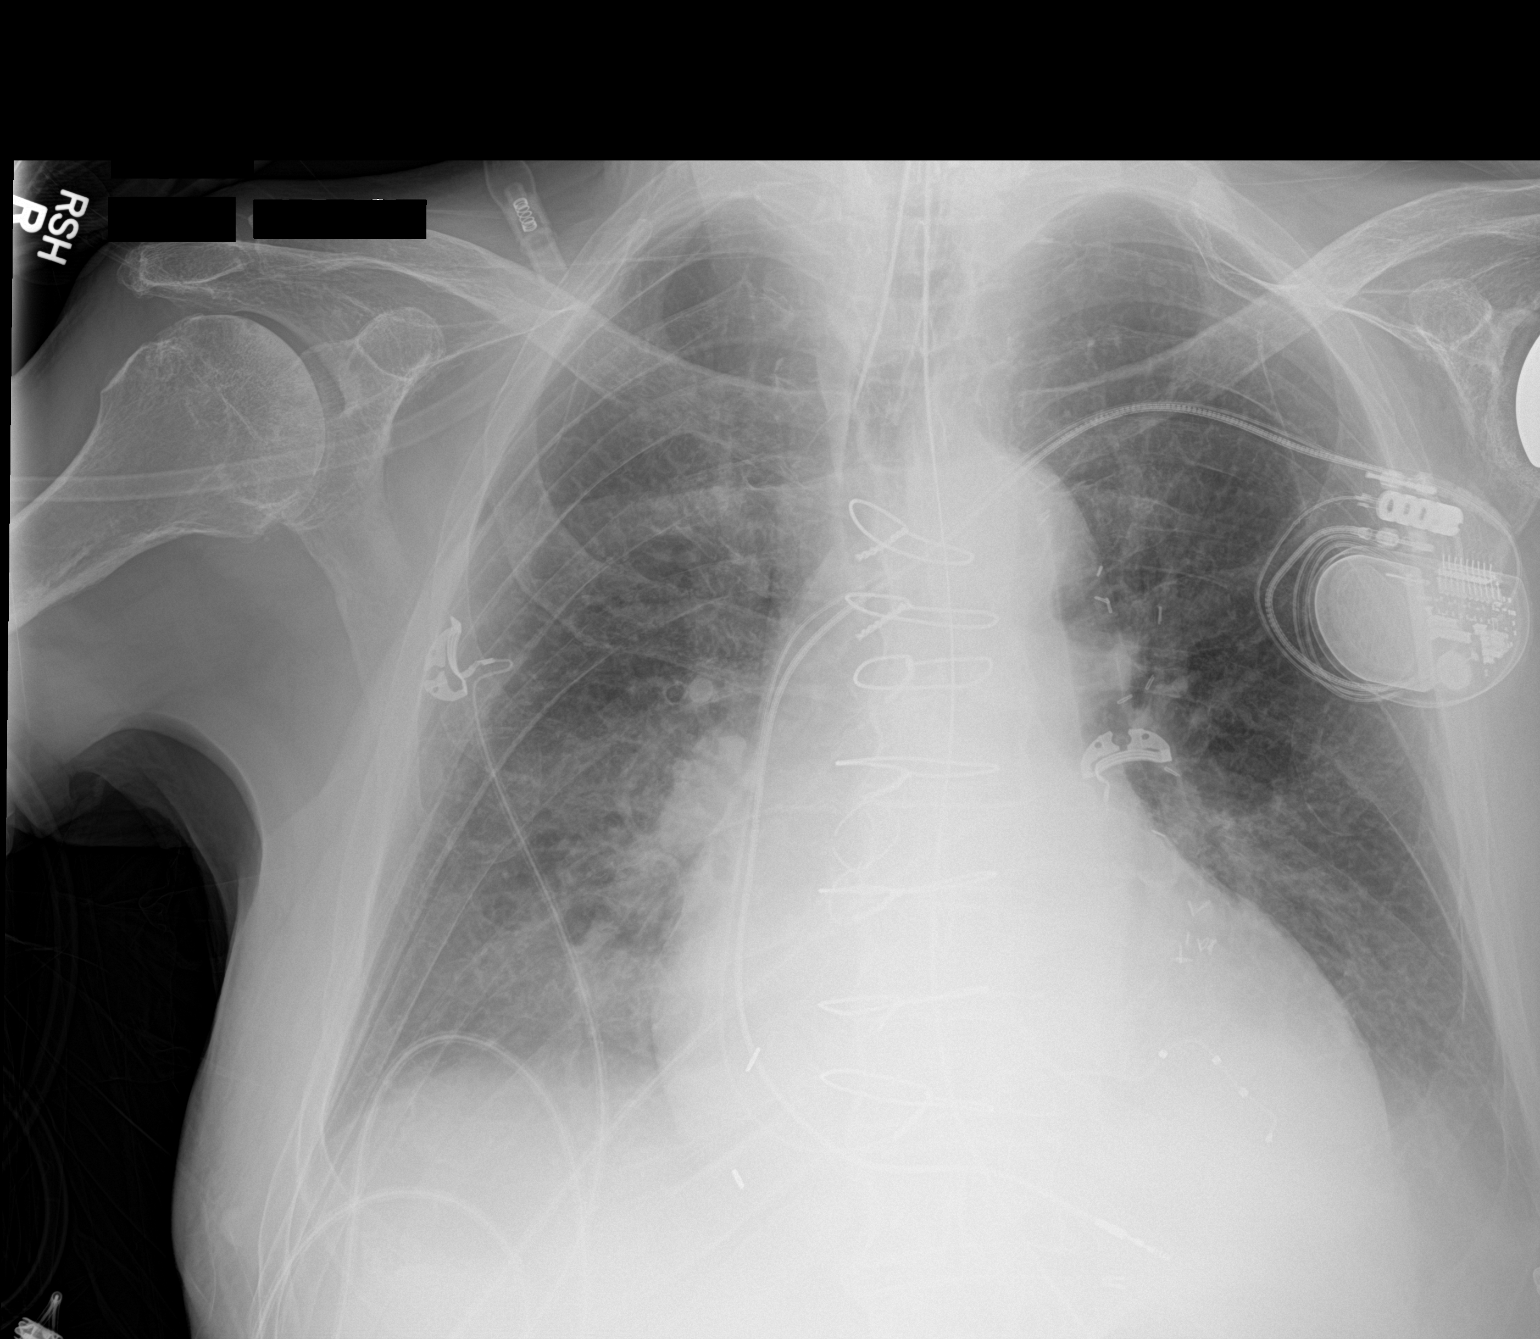

[1 of 1 positions shown; findings below may reference images not displayed]

FINDINGS: Cardiomegaly is again noted. Postsurgical changes are again seen. A
new endotracheal tube and nasogastric catheter are noted in
satisfactory position. The endotracheal tube is approximately 6.1 cm
above the carina. The degree of aeration has improved although
suggestion of small effusions are again noted. The degree of
vascular congestion has also improved.
IMPRESSION: Improved aeration with suggestion of small pleural effusions.

Endotracheal tube and nasogastric catheter in satisfactory position.

## 2016-06-25 IMAGING — DX DG CHEST 1V PORT
1 series · 1 of 1 positions shown · non-contrast
Comparison: 05/13/2015

CLINICAL DATA: Shortness of Breath

EXAM:
PORTABLE CHEST - 1 VIEW

[chest ap]
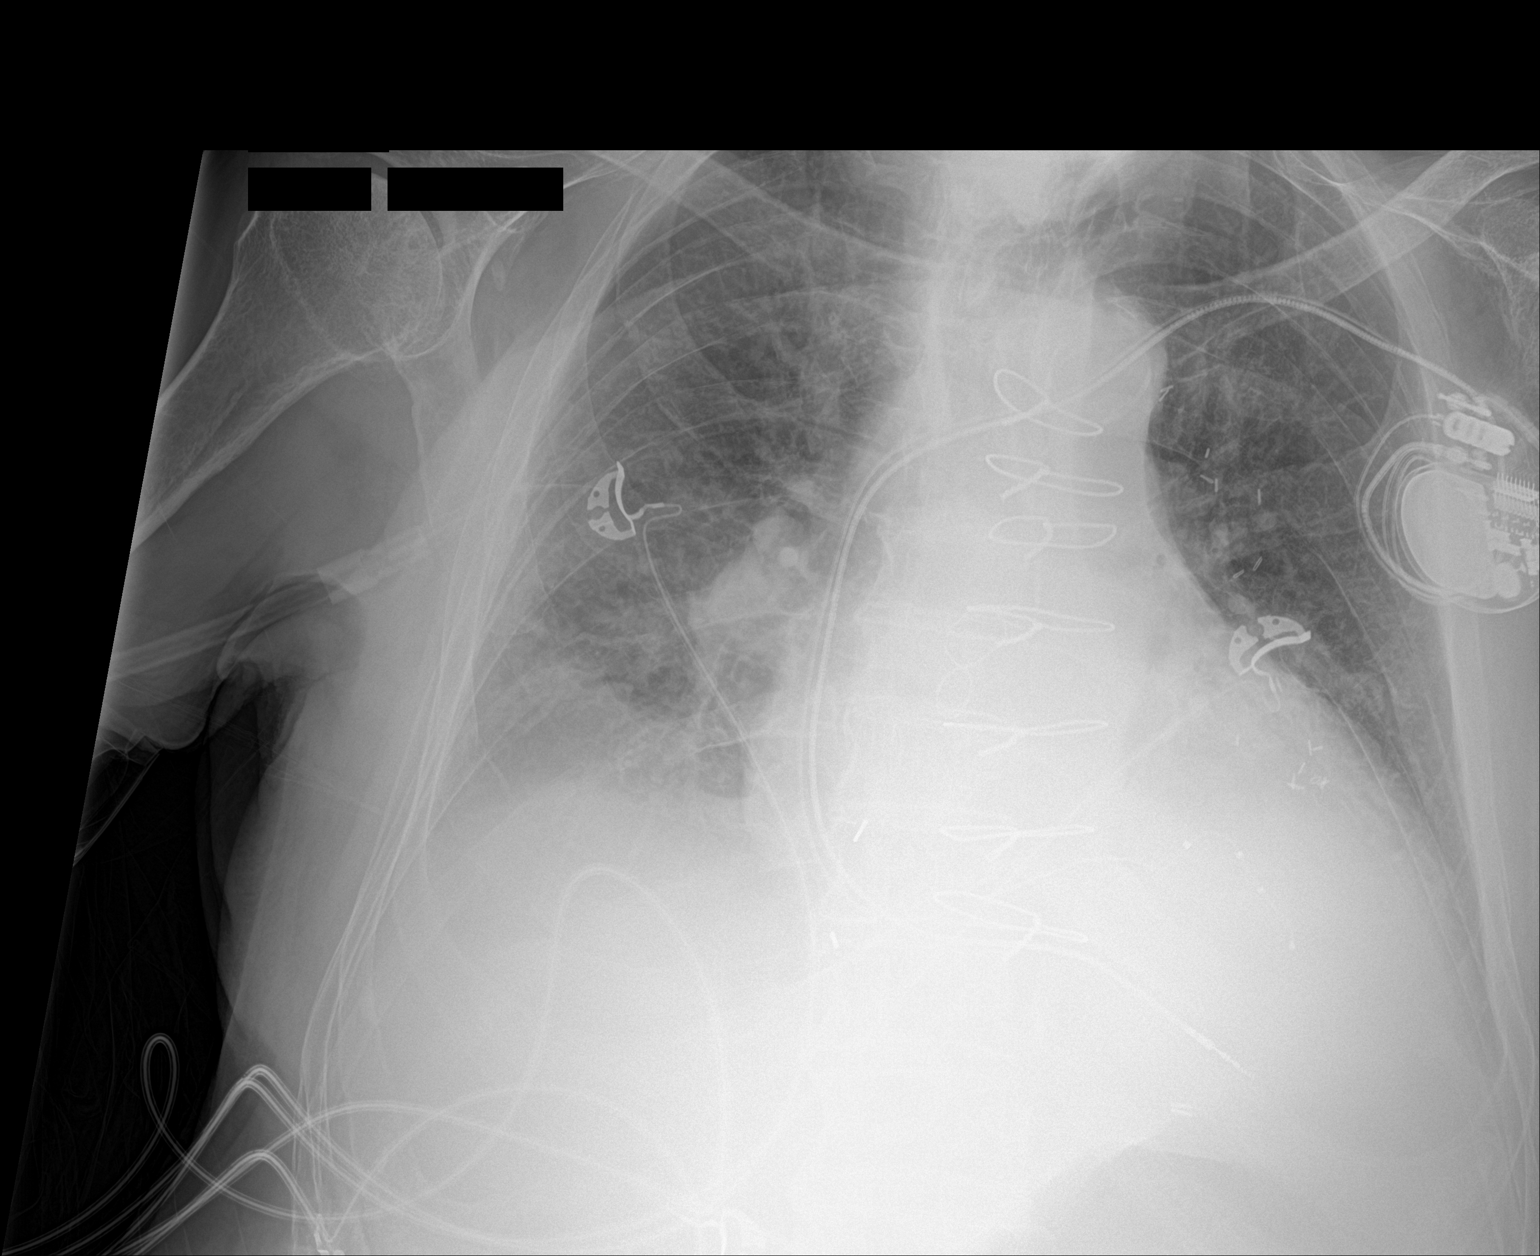

[1 of 1 positions shown; findings below may reference images not displayed]

FINDINGS: Cardiac shadow remains enlarged. Pacing device and postsurgical
changes are again seen. The lungs are well aerated bilaterally. Mild
increased vascular congestion is seen as well as small bilateral
pleural effusions. No focal infiltrate is noted.
IMPRESSION: Mild vascular congestion and small pleural effusions.

## 2016-06-25 IMAGING — CR DG CHEST 1V PORT
1 series · 1 of 1 positions shown · non-contrast
Comparison: 05/15/2015

CLINICAL DATA: Acute respiratory failure with hypoxia. Tube
repositioned. Check placement.

EXAM:
PORTABLE CHEST - 1 VIEW

[AP]
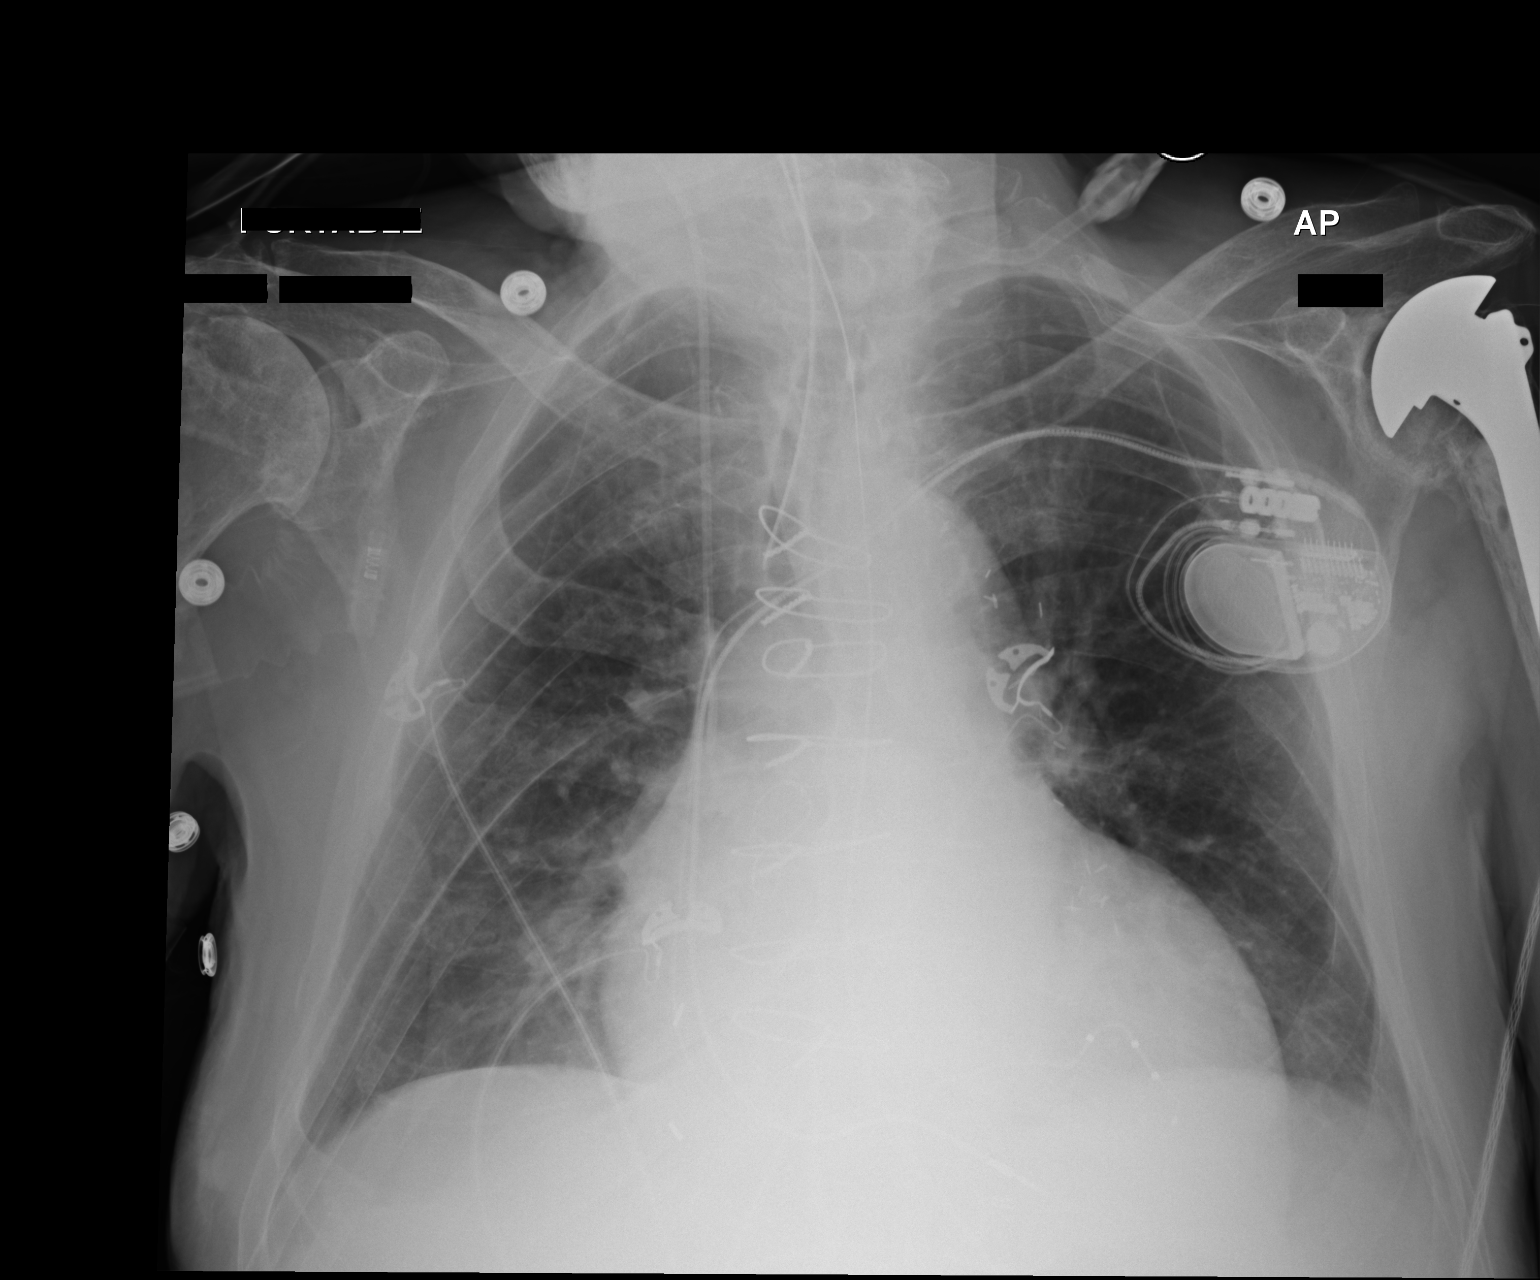

[1 of 1 positions shown; findings below may reference images not displayed]

FINDINGS: Endotracheal tube is in place, 4.3 cm above carina. Right IJ central
line tip overlies the superior vena cava. Nasogastric tube tip is
off the film but beyond the gastroesophageal junction. Left-sided
transvenous pacemaker leads overlying the right ventricle and
coronary sinus.

Heart is mildly enlarged. There is minimal right lower lobe opacity,
improved possibly related to positioning. Suspect right pleural
scratched a suspect bilateral pleural effusions. There is pulmonary
vascular congestion.
IMPRESSION: 1. Repositioned endotracheal tube.
2. Bilateral pleural effusions.

## 2016-06-26 IMAGING — CR DG CHEST 1V PORT
1 series · 1 of 1 positions shown · non-contrast
Comparison: 05/15/2015 .

CLINICAL DATA: Respiratory failure.

EXAM:
PORTABLE CHEST - 1 VIEW

[AP]
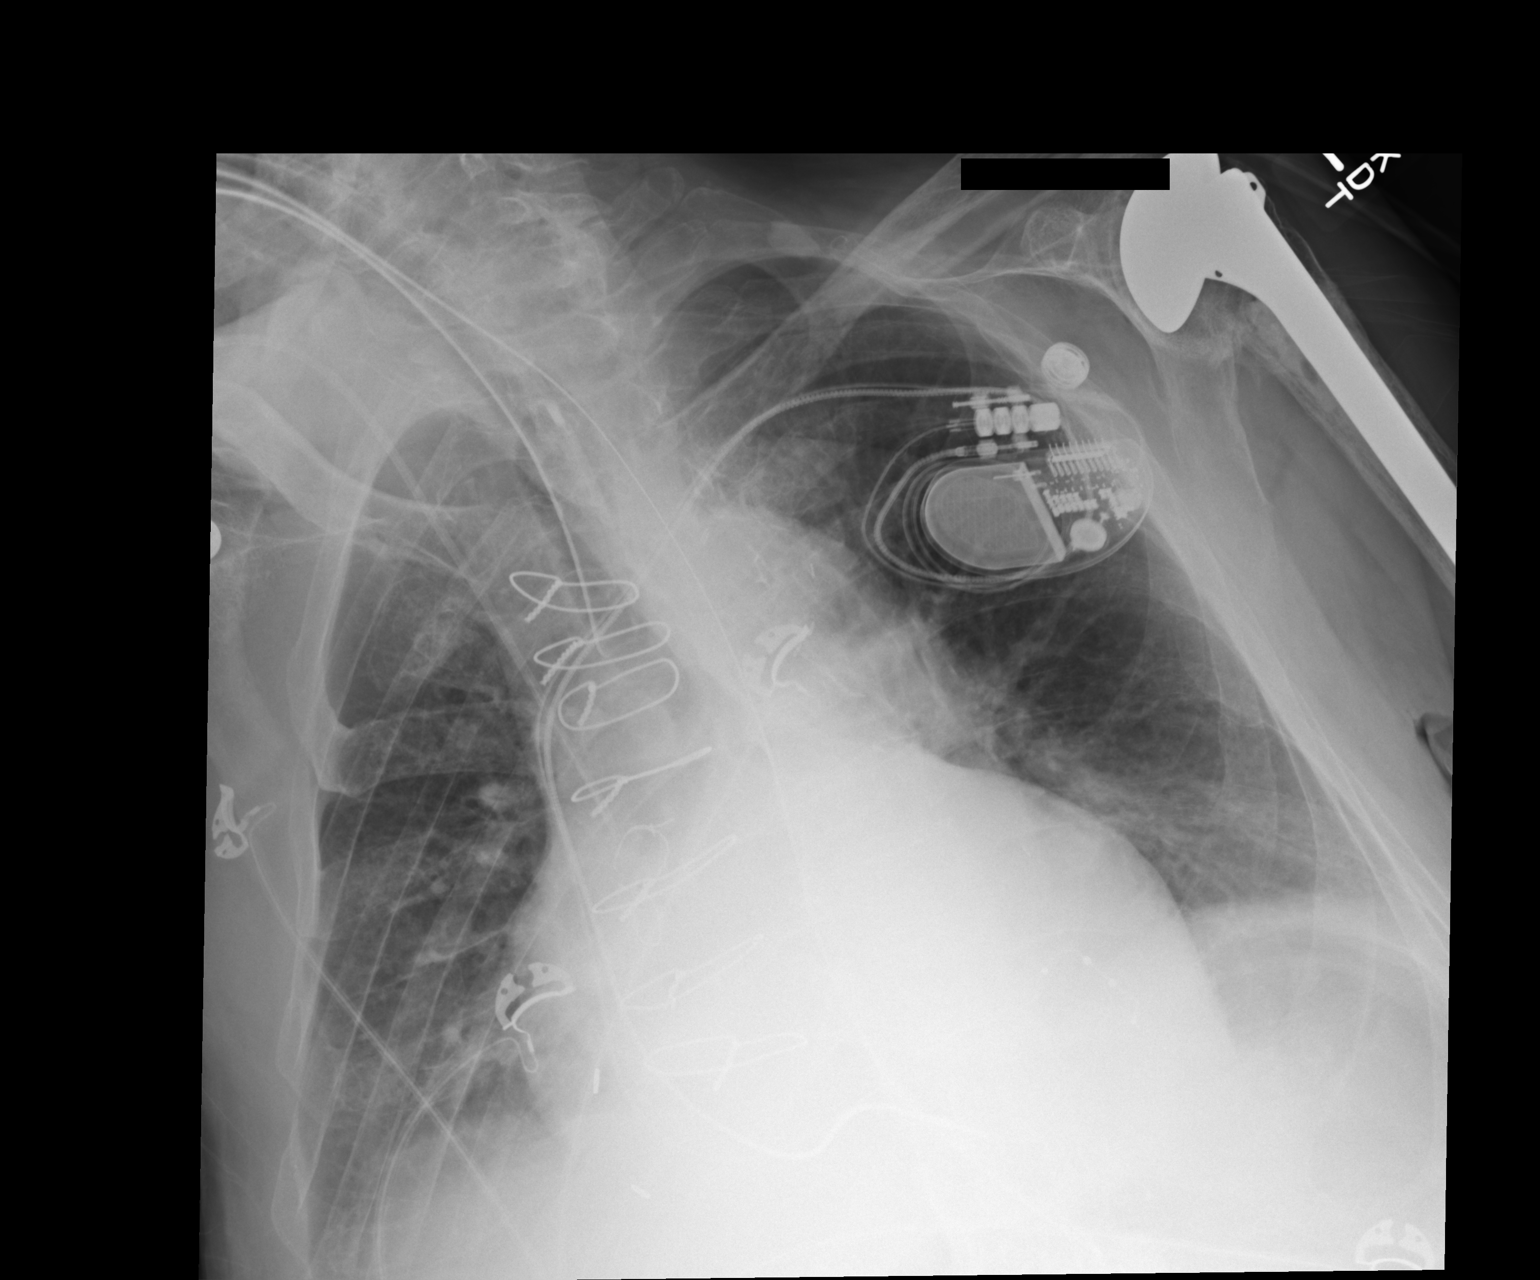

[1 of 1 positions shown; findings below may reference images not displayed]

FINDINGS: Endotracheal tube, NG tube, right IJ line stable position. Prior
CABG. Cardiac pacer stable position. Stable cardiomegaly. Mild
bilateral pulmonary interstitial prominence and pleural effusions.
Findings consistent with mild congestive heart failure . Left
shoulder replacement.
IMPRESSION: 1. Lines and tubes in stable position.
2. Prior CABG. Cardiac pacer stable position. Cardiomegaly with
bilateral pulmonary interstitial prominence and small pleural
effusions consistent congestive heart failure
